# Patient Record
Sex: Male | Born: 1935 | Race: Black or African American | Hispanic: No | Marital: Married | Smoking: Never smoker
Health system: Southern US, Community
[De-identification: ages and names within clinical notes are randomized; demographics above are authoritative.]

## PROBLEM LIST (undated history)

## (undated) DIAGNOSIS — E039 Hypothyroidism, unspecified: Secondary | ICD-10-CM

## (undated) DIAGNOSIS — E119 Type 2 diabetes mellitus without complications: Secondary | ICD-10-CM

## (undated) DIAGNOSIS — A0472 Enterocolitis due to Clostridium difficile, not specified as recurrent: Secondary | ICD-10-CM

## (undated) DIAGNOSIS — R630 Anorexia: Secondary | ICD-10-CM

## (undated) DIAGNOSIS — IMO0001 Reserved for inherently not codable concepts without codable children: Secondary | ICD-10-CM

## (undated) DIAGNOSIS — D649 Anemia, unspecified: Secondary | ICD-10-CM

## (undated) DIAGNOSIS — R7881 Bacteremia: Secondary | ICD-10-CM

## (undated) DIAGNOSIS — I639 Cerebral infarction, unspecified: Secondary | ICD-10-CM

## (undated) DIAGNOSIS — A5203 Syphilitic endocarditis: Secondary | ICD-10-CM

## (undated) DIAGNOSIS — K219 Gastro-esophageal reflux disease without esophagitis: Secondary | ICD-10-CM

## (undated) DIAGNOSIS — I10 Essential (primary) hypertension: Secondary | ICD-10-CM

## (undated) HISTORY — DX: Type 2 diabetes mellitus without complications: E11.9

## (undated) HISTORY — PX: HERNIA REPAIR: SHX51

## (undated) HISTORY — PX: PEG PLACEMENT: SHX5437

## (undated) HISTORY — DX: Bacteremia: R78.81

## (undated) HISTORY — DX: Enterocolitis due to Clostridium difficile, not specified as recurrent: A04.72

## (undated) HISTORY — DX: Cerebral infarction, unspecified: I63.9

## (undated) HISTORY — PX: COLOSTOMY: SHX63

## (undated) HISTORY — DX: Anemia, unspecified: D64.9

---

## 2002-05-07 ENCOUNTER — Encounter: Payer: Self-pay | Admitting: Urology

## 2002-05-07 ENCOUNTER — Encounter: Admission: RE | Admit: 2002-05-07 | Discharge: 2002-05-07 | Payer: Self-pay | Admitting: Urology

## 2002-07-21 ENCOUNTER — Ambulatory Visit: Admission: RE | Admit: 2002-07-21 | Discharge: 2002-10-05 | Payer: Self-pay | Admitting: Radiation Oncology

## 2002-08-19 ENCOUNTER — Inpatient Hospital Stay (HOSPITAL_COMMUNITY): Admission: AD | Admit: 2002-08-19 | Discharge: 2002-08-20 | Payer: Self-pay | Admitting: Endocrinology

## 2002-08-20 ENCOUNTER — Encounter: Payer: Self-pay | Admitting: Endocrinology

## 2003-02-13 ENCOUNTER — Emergency Department (HOSPITAL_COMMUNITY): Admission: EM | Admit: 2003-02-13 | Discharge: 2003-02-14 | Payer: Self-pay | Admitting: Emergency Medicine

## 2003-05-04 ENCOUNTER — Ambulatory Visit: Admission: RE | Admit: 2003-05-04 | Discharge: 2003-05-04 | Payer: Self-pay | Admitting: Radiation Oncology

## 2003-05-11 ENCOUNTER — Ambulatory Visit: Admission: RE | Admit: 2003-05-11 | Discharge: 2003-05-11 | Payer: Self-pay | Admitting: Radiation Oncology

## 2003-07-27 ENCOUNTER — Ambulatory Visit (HOSPITAL_BASED_OUTPATIENT_CLINIC_OR_DEPARTMENT_OTHER): Admission: RE | Admit: 2003-07-27 | Discharge: 2003-07-27 | Payer: Self-pay | Admitting: Endocrinology

## 2005-11-28 ENCOUNTER — Inpatient Hospital Stay (HOSPITAL_COMMUNITY): Admission: RE | Admit: 2005-11-28 | Discharge: 2005-11-30 | Payer: Self-pay | Admitting: Urology

## 2006-04-07 ENCOUNTER — Emergency Department (HOSPITAL_COMMUNITY): Admission: EM | Admit: 2006-04-07 | Discharge: 2006-04-07 | Payer: Self-pay | Admitting: Emergency Medicine

## 2010-05-06 ENCOUNTER — Encounter: Payer: Self-pay | Admitting: Orthopedic Surgery

## 2011-01-19 ENCOUNTER — Emergency Department (HOSPITAL_COMMUNITY): Payer: Medicare Other

## 2011-01-19 ENCOUNTER — Inpatient Hospital Stay (HOSPITAL_COMMUNITY): Payer: Medicare Other

## 2011-01-19 ENCOUNTER — Inpatient Hospital Stay (HOSPITAL_COMMUNITY)
Admission: EM | Admit: 2011-01-19 | Discharge: 2011-01-29 | DRG: 288 | Disposition: A | Discharge status: Skilled Nursing Facility | Payer: Medicare Other | Attending: Internal Medicine | Admitting: Internal Medicine

## 2011-01-19 DIAGNOSIS — E119 Type 2 diabetes mellitus without complications: Secondary | ICD-10-CM | POA: Diagnosis present

## 2011-01-19 DIAGNOSIS — Z933 Colostomy status: Secondary | ICD-10-CM

## 2011-01-19 DIAGNOSIS — R5383 Other fatigue: Secondary | ICD-10-CM | POA: Diagnosis present

## 2011-01-19 DIAGNOSIS — J189 Pneumonia, unspecified organism: Secondary | ICD-10-CM | POA: Diagnosis present

## 2011-01-19 DIAGNOSIS — Z431 Encounter for attention to gastrostomy: Secondary | ICD-10-CM

## 2011-01-19 DIAGNOSIS — E871 Hypo-osmolality and hyponatremia: Secondary | ICD-10-CM | POA: Diagnosis present

## 2011-01-19 DIAGNOSIS — I69991 Dysphagia following unspecified cerebrovascular disease: Secondary | ICD-10-CM

## 2011-01-19 DIAGNOSIS — L8992 Pressure ulcer of unspecified site, stage 2: Secondary | ICD-10-CM | POA: Diagnosis present

## 2011-01-19 DIAGNOSIS — Z794 Long term (current) use of insulin: Secondary | ICD-10-CM

## 2011-01-19 DIAGNOSIS — R1310 Dysphagia, unspecified: Secondary | ICD-10-CM | POA: Diagnosis present

## 2011-01-19 DIAGNOSIS — R5381 Other malaise: Secondary | ICD-10-CM | POA: Diagnosis present

## 2011-01-19 DIAGNOSIS — I10 Essential (primary) hypertension: Secondary | ICD-10-CM | POA: Diagnosis present

## 2011-01-19 DIAGNOSIS — R000 Tachycardia, unspecified: Secondary | ICD-10-CM | POA: Diagnosis not present

## 2011-01-19 DIAGNOSIS — D638 Anemia in other chronic diseases classified elsewhere: Secondary | ICD-10-CM | POA: Diagnosis present

## 2011-01-19 DIAGNOSIS — B952 Enterococcus as the cause of diseases classified elsewhere: Secondary | ICD-10-CM | POA: Diagnosis present

## 2011-01-19 DIAGNOSIS — N390 Urinary tract infection, site not specified: Secondary | ICD-10-CM | POA: Diagnosis present

## 2011-01-19 DIAGNOSIS — G9340 Encephalopathy, unspecified: Secondary | ICD-10-CM | POA: Diagnosis present

## 2011-01-19 DIAGNOSIS — L89109 Pressure ulcer of unspecified part of back, unspecified stage: Secondary | ICD-10-CM | POA: Diagnosis present

## 2011-01-19 DIAGNOSIS — I959 Hypotension, unspecified: Secondary | ICD-10-CM | POA: Diagnosis present

## 2011-01-19 DIAGNOSIS — I4891 Unspecified atrial fibrillation: Secondary | ICD-10-CM | POA: Diagnosis present

## 2011-01-19 DIAGNOSIS — E43 Unspecified severe protein-calorie malnutrition: Secondary | ICD-10-CM | POA: Diagnosis present

## 2011-01-19 DIAGNOSIS — I69959 Hemiplegia and hemiparesis following unspecified cerebrovascular disease affecting unspecified side: Secondary | ICD-10-CM

## 2011-01-19 DIAGNOSIS — I330 Acute and subacute infective endocarditis: Principal | ICD-10-CM | POA: Diagnosis present

## 2011-01-19 LAB — COMPREHENSIVE METABOLIC PANEL
ALT: 43 U/L (ref 0–53)
AST: 60 U/L — ABNORMAL HIGH (ref 0–37)
Alkaline Phosphatase: 86 U/L (ref 39–117)
CO2: 24 mEq/L (ref 19–32)
Calcium: 10.3 mg/dL (ref 8.4–10.5)
Glucose, Bld: 135 mg/dL — ABNORMAL HIGH (ref 70–99)
Potassium: 5.7 mEq/L — ABNORMAL HIGH (ref 3.5–5.1)
Sodium: 136 mEq/L (ref 135–145)
Total Protein: 9.1 g/dL — ABNORMAL HIGH (ref 6.0–8.3)

## 2011-01-19 LAB — BASIC METABOLIC PANEL
CO2: 22 mEq/L (ref 19–32)
Chloride: 104 mEq/L (ref 96–112)
GFR calc Af Amer: 81 mL/min — ABNORMAL LOW (ref >90)
Potassium: 5 mEq/L (ref 3.5–5.1)
Sodium: 134 mEq/L — ABNORMAL LOW (ref 135–145)

## 2011-01-19 LAB — CBC
HCT: 31 % — ABNORMAL LOW (ref 39.0–52.0)
Hemoglobin: 10.1 g/dL — ABNORMAL LOW (ref 13.0–17.0)
MCHC: 32.6 g/dL (ref 30.0–36.0)
WBC: 9.5 10*3/uL (ref 4.0–10.5)

## 2011-01-19 LAB — DIFFERENTIAL
Band Neutrophils: 0 % (ref 0–10)
Basophils Absolute: 0.2 10*3/uL — ABNORMAL HIGH (ref 0.0–0.1)
Basophils Relative: 2 % — ABNORMAL HIGH (ref 0–1)
Blasts: 0 %
Lymphs Abs: 1.8 10*3/uL (ref 0.7–4.0)
Metamyelocytes Relative: 0 %
Myelocytes: 0 %
Promyelocytes Absolute: 0 %

## 2011-01-19 LAB — URINALYSIS, ROUTINE W REFLEX MICROSCOPIC
Bilirubin Urine: NEGATIVE
Glucose, UA: NEGATIVE mg/dL
Hgb urine dipstick: NEGATIVE
Specific Gravity, Urine: 1.015 (ref 1.005–1.030)

## 2011-01-19 LAB — URINE MICROSCOPIC-ADD ON

## 2011-01-19 LAB — POCT I-STAT TROPONIN I: Troponin i, poc: 0.03 ng/mL (ref 0.00–0.08)

## 2011-01-19 LAB — LACTIC ACID, PLASMA: Lactic Acid, Venous: 1.5 mmol/L (ref 0.5–2.2)

## 2011-01-19 LAB — GLUCOSE, CAPILLARY: Glucose-Capillary: 156 mg/dL — ABNORMAL HIGH (ref 70–99)

## 2011-01-19 MED ORDER — IODIXANOL 320 MG/ML IV SOLN: 100.0000 mL | Freq: Once | INTRAVENOUS | Status: AC | PRN

## 2011-01-19 MED ORDER — IOHEXOL 300 MG/ML  SOLN: 50.0000 mL | Freq: Once | INTRAMUSCULAR | Status: AC | PRN

## 2011-01-19 MED ORDER — IOHEXOL 300 MG/ML  SOLN
30.0000 mL | Freq: Once | INTRAMUSCULAR | Status: AC | PRN
  Administered 2011-01-19: 30 mL

## 2011-01-19 NOTE — H&P (Signed)
NAMEORA, [name] NO.:  [id number]  MEDICAL RECORD NO.:  [id number]  LOCATION:  1512                         FACILITY:  [facility]  PHYSICIAN:  Andreas Blower, MD       DATE OF BIRTH:  1935/09/10  DATE OF ADMISSION:  01/19/2011 DATE OF DISCHARGE:                             HISTORY & PHYSICAL   PRIMARY CARE PHYSICIAN:  Dr. Renato Gails.  CHIEF COMPLAINT:  Hypotension and the patient pulled his feeding tube.  HISTORY OF PRESENT ILLNESS:  [name] is a 75 year old gentleman with history of CVA, hypertension, diabetes, with right-sided weakness from his CVA, history of bacteremia and colostomy who had been getting his care at [facility] up until January 15, 2011, subsequently after that he was transferred to [facility] in [place] for rehab.  The wife provided most the history.  It was reported that after transfer, the patient was sleeping most of the day over the last 3 days.  However, today, the patient had dislodged his G- tube this morning.  The patient was started on levofloxacin yesterday for pneumonia.  The patient also was found to be hypotensive in the emergency department with the blood pressure of 84/48, which improved after fluid bolus in the ER.  As a result, the hospitalist service was asked to admit the patient for further care and management. Per wife, has not had any recent fevers, chills, has not had any chest pain, shortness of breath, has not had any abdominal pain, diarrhea, has not had any new headaches or vision changes.  REVIEW OF SYSTEMS:  All systems were reviewed with the patient was positive as per HPI, otherwise all other systems were negative.  PAST MEDICAL HISTORY: 1. History of CVA with residual right-sided weakness.  The patient is     very immobilized due to his CVA. 2. Hypertension. 3. Type 2 diabetes. 4. Severe protein calorie malnutrition. 5. History of bacteremia, was treated at [facility]. 6. History of colostomy  done at [facility]. 7. History of Clostridium difficile colitis.  SOCIAL HISTORY:  The patient does not smoke, does not drink any alcohol. Denies any illegal drugs or substances.  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  FAMILY HISTORY:  Mother died from lung cancer.  Father died from old age after a fall.  He had an elder sister who died from Alzheimer and a younger sister who also is deceased.  HOME MEDICATIONS: 1. Metoprolol 25 mg p.o. by PEG twice daily. 2. Vitamin B1 100 mg p.o. by PEG daily. 3. Tramadol 10 mg p.o. by PEG every 6 hours as needed for pain. 4. Levothyroxine 300 mcg p.o. daily by PEG. 5. Senna-S 2 tablets by PEG twice daily. 6. Ranitidine 150 mg p.o. via PEG twice daily. 7. Pravastatin 40 mg by PEG daily. 8. Potassium chloride 10 mEq p.o. daily by PEG. 9. Omeprazole 20 mg p.o. via PEG daily. 10.NovoLog 3 units every 6 hours as needed for blood sugar greater     than 160. 11.Multivitamin 1 tablet via PEG daily. 12.Mirtazapine 15 mg via PEG daily at bedtime. 13.Metformin 500 mg via PEG 1 tablet daily. 14.Levofloxacin 500 mg via PEG  daily. 15.Guaifenesin 15 mL 4 times a day. 16.Gabapentin 300 mg via PEG daily at bedtime. 17.Furosemide 20 mg p.o. via PEG daily. 18.Florastor 250 mg via PEG twice daily. 19.Citalopram 10 mg via PEG daily. 20.Ipratropium inhaler 2 puffs every 6 hours as needed. 21.Atropine ophthalmic solution 1% 2 drops every 6 hours. 22.Aspirin 325 mg p.o. daily. 23.Acetaminophen 650 mg every 6 hours as needed for pain.  PHYSICAL EXAMINATION:  VITALS:  Temperature is 98.7, blood pressure 101/70, heart rate 95, respirations 18, satting 100% on room air. GENERAL:  The patient was alert, oriented x3, did not appear to be in acute distress, was laying in bed comfortably. HEENT:  Extraocular motions are intact.  Pupils equal, and round.  Had dry mucous membranes. NECK:  Supple. HEART:  Regular with S1 and S2. LUNGS:  Clear to auscultation  bilaterally. ABDOMEN:  Soft, nontender, and nondistended.  PEG in place. EXTREMITIES:  The patient has good peripheral pulses with trace edema. NEUROLOGIC:  Cranial nerves grossly intact.  Had 5/5 motor strength in left upper as well as lower extremity.  The patient had decreased strength in right upper as well as lower extremities against strength but was able to move his right upper extremity against gravity.  RADIOLOGY/IMAGING:  The patient had chest x-ray which showed slight increase markings of left lung base, may represent crowding of vessels, although subtle tree is not excluded.  The patient had abdominal KUB which showed gastrostomy tube within the stomach with no extravasation.  LABORATORY DATA:  CBC shows a white count of 9.5, hemoglobin 10.1, hematocrit 31.0, platelet count 292.  Electrolytes, sodium 134, potassium 5.0, chloride 104, CO2 of 22, BUN 44, creatinine 1.03.  Liver function tests normal except AST is 60, total protein is 9.1.  UA is negative for nitrites and large leukocytes.  ASSESSMENT/PLAN: 1. Acute delirium.  Etiology unclear.  Uncertain if the patient has     subtle underlying pneumonia and urinary tract infection that is     causing his delirium.  The patient is easily arousable.  We will     get a head CT for better evaluation. 2. Hypotension.  Etiology unclear.  The patient is most likely     dehydrated.  We will continue IV hydration.  We will hold     furosemide.  Low suspicion that the patient is septic at this     time, with normal lactic acid. 3. Questionable pneumonia on imaging.  The patient has been started on     empiric vancomycin and Zosyn in the ER.  Antibiotics will be     continued.  We will have the patient on vancomycin, Levaquin and     cefepime.  Depending on the patient's clinical course, we will de-     escalate the antibiotics rapidly if the patient is improving. 4. Urinary tract infection.  Antibiotics as indicated above.  The      patient is on cefepime and levofloxacin. 5. Dehydration.  Continue IV hydration.  We will reassess in the     morning. 6. Dysphagia, likely due to history of CVA.  We will have Speech     Therapy evaluate his swallowing. 7. Dysphagia with severe protein calorie malnutrition.  The patient     currently has PEG placed in the emergency department.  We will     contact Interventional Radiology for placement of a gastric tube     that ends in the jejunum. 8. History of CVA, stable.  Continue home  medications.  We will get a     head CT for evaluation. 9. Anemia, likely due to chronic disease. 10.Hypertension.  The patient is hypotensive.  We will continue     metoprolol with hold parameters, holding furosemide. 11.Type 2 diabetes.  We will have the patient on sliding-scale     insulin. 12.Generalized weakness.  We will have PT and OT evaluate the patient. 13.History of bacteremia.  Not an active issue at this time. 14.Hyponatremia.  Monitor for now. 15.Deep venous thrombosis prophylaxis.  Lovenox for deep venous     thrombosis prophylaxis. 16.Code status.  The patient is full code.  This was discussed with     the patient and wife at the time of admission.   Time spent on admission talking to the patient's wife and coordinating care was 1 hour.   Andreas Blower, MD   SR/MEDQ  D:  01/19/2011  T:  01/19/2011  Job:  [id number]  Electronically Signed by Wardell Heath Jamerica Snavely  on 01/19/2011 08:11:55 PM

## 2011-01-20 LAB — CBC
MCH: 25.4 pg — ABNORMAL LOW (ref 26.0–34.0)
MCHC: 32.1 g/dL (ref 30.0–36.0)
MCV: 79 fL (ref 78.0–100.0)
Platelets: 232 10*3/uL (ref 150–400)
RBC: 3.19 MIL/uL — ABNORMAL LOW (ref 4.22–5.81)

## 2011-01-20 LAB — BASIC METABOLIC PANEL
CO2: 21 mEq/L (ref 19–32)
Calcium: 8.6 mg/dL (ref 8.4–10.5)
Creatinine, Ser: 0.82 mg/dL (ref 0.50–1.35)

## 2011-01-20 LAB — GLUCOSE, CAPILLARY
Glucose-Capillary: 112 mg/dL — ABNORMAL HIGH (ref 70–99)
Glucose-Capillary: 111 mg/dL — ABNORMAL HIGH (ref 70–99)
Glucose-Capillary: 109 mg/dL — ABNORMAL HIGH (ref 70–99)

## 2011-01-21 LAB — GLUCOSE, CAPILLARY
Glucose-Capillary: 78 mg/dL (ref 70–99)
Glucose-Capillary: 100 mg/dL — ABNORMAL HIGH (ref 70–99)

## 2011-01-22 ENCOUNTER — Inpatient Hospital Stay (HOSPITAL_COMMUNITY)
Admit: 2011-01-22 | Discharge: 2011-01-22 | Disposition: A | Discharge status: Home / Self Care | Payer: Medicare Other | Attending: Internal Medicine | Admitting: Internal Medicine

## 2011-01-22 LAB — GLUCOSE, CAPILLARY
Glucose-Capillary: 146 mg/dL — ABNORMAL HIGH (ref 70–99)
Glucose-Capillary: 151 mg/dL — ABNORMAL HIGH (ref 70–99)
Glucose-Capillary: 152 mg/dL — ABNORMAL HIGH (ref 70–99)
Glucose-Capillary: 155 mg/dL — ABNORMAL HIGH (ref 70–99)
Glucose-Capillary: 165 mg/dL — ABNORMAL HIGH (ref 70–99)

## 2011-01-22 LAB — COMPREHENSIVE METABOLIC PANEL
Albumin: 2.1 g/dL — ABNORMAL LOW (ref 3.5–5.2)
BUN: 18 mg/dL (ref 6–23)
Calcium: 8.9 mg/dL (ref 8.4–10.5)
GFR calc Af Amer: >90 mL/min (ref >90)
Glucose, Bld: 163 mg/dL — ABNORMAL HIGH (ref 70–99)
Potassium: 3.9 mEq/L (ref 3.5–5.1)
Sodium: 136 mEq/L (ref 135–145)
Total Protein: 7 g/dL (ref 6.0–8.3)

## 2011-01-22 LAB — CBC
HCT: 25.9 % — ABNORMAL LOW (ref 39.0–52.0)
Hemoglobin: 8.1 g/dL — ABNORMAL LOW (ref 13.0–17.0)
MCH: 25.2 pg — ABNORMAL LOW (ref 26.0–34.0)
MCHC: 31.3 g/dL (ref 30.0–36.0)
RDW: 15.9 % — ABNORMAL HIGH (ref 11.5–15.5)

## 2011-01-22 NOTE — Procedures (Signed)
HISTORY:  A 75 year old male with right-sided weakness.  MEDICATIONS:  NovoLog, Lovenox, Lopressor, vitamin, Synthroid, Senokot, Pepcid, Remeron, Protonix, Zocor, Neurontin, Florastor, Celexa and Rocephin.  CONDITIONS OF RECORDING:  This is a 16-channel EEG carried out with the patient in the awake and drowsy states.  DESCRIPTION:  The waking background activity consists of a low-voltage symmetrical fairly well-organized 8-9 Hz alpha activity seen from the parieto-occipital and posterior temporal regions.  Low-voltage fast activity poorly organized was seen and during at times superimposed on more posterior rhythms.  A mixture of theta and alpha was seen from the central and temporal regions.  The patient drowses with slowing to irregular, low-voltage theta and beta activity.  Stage 2 sleep is not obtained.  Hypoventilation was not performed.  Intermittent photic stimulation was also not performed.  IMPRESSION:  This is a normal awake and drowsy EEG.  No epileptiform activity was noted.          ______________________________ Thana Farr, MD    QI:ONGE D:  01/22/2011 17:48:06  T:  01/22/2011 22:59:33  Job #:  [id number]

## 2011-01-23 ENCOUNTER — Inpatient Hospital Stay (HOSPITAL_COMMUNITY): Payer: Medicare Other

## 2011-01-23 DIAGNOSIS — R7881 Bacteremia: Secondary | ICD-10-CM

## 2011-01-23 LAB — GLUCOSE, CAPILLARY
Glucose-Capillary: 164 mg/dL — ABNORMAL HIGH (ref 70–99)
Glucose-Capillary: 185 mg/dL — ABNORMAL HIGH (ref 70–99)
Glucose-Capillary: 198 mg/dL — ABNORMAL HIGH (ref 70–99)

## 2011-01-23 LAB — CBC
MCV: 80.4 fL (ref 78.0–100.0)
Platelets: 249 10*3/uL (ref 150–400)
RDW: 16.3 % — ABNORMAL HIGH (ref 11.5–15.5)
WBC: 3.7 10*3/uL — ABNORMAL LOW (ref 4.0–10.5)

## 2011-01-23 LAB — BASIC METABOLIC PANEL
Chloride: 108 mEq/L (ref 96–112)
Creatinine, Ser: 0.65 mg/dL (ref 0.50–1.35)
GFR calc Af Amer: >90 mL/min (ref >90)
Potassium: 3.8 mEq/L (ref 3.5–5.1)
Sodium: 137 mEq/L (ref 135–145)

## 2011-01-24 ENCOUNTER — Ambulatory Visit (HOSPITAL_COMMUNITY)
Admission: AD | Admit: 2011-01-24 | Discharge: 2011-01-24 | Disposition: A | Discharge status: Home / Self Care | Payer: Medicare Other | Source: Ambulatory Visit | Attending: Cardiology | Admitting: Cardiology

## 2011-01-24 DIAGNOSIS — I39 Endocarditis and heart valve disorders in diseases classified elsewhere: Secondary | ICD-10-CM

## 2011-01-24 DIAGNOSIS — I330 Acute and subacute infective endocarditis: Secondary | ICD-10-CM

## 2011-01-24 DIAGNOSIS — I059 Rheumatic mitral valve disease, unspecified: Secondary | ICD-10-CM

## 2011-01-24 LAB — CBC
HCT: 26 % — ABNORMAL LOW (ref 39.0–52.0)
Hemoglobin: 8.4 g/dL — ABNORMAL LOW (ref 13.0–17.0)
MCH: 25.7 pg — ABNORMAL LOW (ref 26.0–34.0)
MCV: 79.5 fL (ref 78.0–100.0)
RBC: 3.27 MIL/uL — ABNORMAL LOW (ref 4.22–5.81)
RDW: 16.5 % — ABNORMAL HIGH (ref 11.5–15.5)

## 2011-01-24 LAB — BASIC METABOLIC PANEL
BUN: 16 mg/dL (ref 6–23)
CO2: 26 mEq/L (ref 19–32)
Glucose, Bld: 165 mg/dL — ABNORMAL HIGH (ref 70–99)
Potassium: 3.8 mEq/L (ref 3.5–5.1)
Sodium: 139 mEq/L (ref 135–145)

## 2011-01-24 LAB — GLUCOSE, CAPILLARY
Glucose-Capillary: 165 mg/dL — ABNORMAL HIGH (ref 70–99)
Glucose-Capillary: 161 mg/dL — ABNORMAL HIGH (ref 70–99)
Glucose-Capillary: 122 mg/dL — ABNORMAL HIGH (ref 70–99)
Glucose-Capillary: 156 mg/dL — ABNORMAL HIGH (ref 70–99)

## 2011-01-24 NOTE — Consult Note (Signed)
[name], [name] NO.:  [id number]  MEDICAL RECORD NO.:  [id number]  LOCATION:  EE                           FACILITY:  [facility]  PHYSICIAN:  Judyann Munson, MD     DATE OF BIRTH:  [birth year]  DATE OF CONSULTATION: DATE OF DISCHARGE:  01/22/2011                                CONSULTATION   REQUESTING PHYSICIAN:  Andreas Blower, MD  REASON FOR CONSULTATION:  Bacteremia and urinary tract infection. Please provide antibiotic recommendations.  HISTORY OF PRESENT ILLNESS:  [name] is a 75 year old African American male, with history of hypertension, diabetes, cerebrovascular accident with sequelae of right-sided weakness.  He is status post PEG and status post colostomy.  Patient is known to seek his care at [facility] and has had multiple admissions over the past year including being treated for bacteremia as well as having C. difficile infection for which he was on prolonged vancomycin taper for 6 weeks.  The patient was transferred from local rehab center in [place] for malaise, somnolence x 3 days and pulling out in his PEG tube.  In the emergency room, he was found to be hypotensive with systolics in the 80s/40s, which subsequently improved with IV fluid hydration.  Due to his altered mental status, he underwent an infectious workup, which included blood cultures, urine cultures, chest x-ray, as well ass NCHCT to ensure there was no further intracranial process that could account for his altered mental status.  Per report, his wife stated that there was no mention of recent fevers or chills, night sweats, no chest pain, or shortness of breath or cough.  No abdominal pain or diarrhea.    The patient, on admit, had a white count of 9.5 with 62% neutrophils, however, this was somewhat elevated from his baseline as his white count is 3.7 today.  He was afebrile and no longer had any hypotension after fluid resuscitation.  His blood cultures on  January 19, 2011, did show not only coag-negative staphylococcus species, but enterococcus. he had 2 sets of urine cultures sent, 1 of which showing an E. coli and the second of which showed enterococcus species.  The patient was initially started on levofloxacin at the rehab center for unclear reasons and subsequently was seen in the emergency room where his antibiotics were changed to vancomycin, piperacillin and tazobactam.  The patient was ultimately changed to vancomycin and cefepime, however, once his urine cultures showed that he had an E. Coli species that was sensitive to ceftriaxone, he remained on ceftriaxone and vancomycin.   the patient states that he is still having malaise, but no fevers or chills.  PAST MEDICAL HISTORY: 1. Hypertension. 2. Type 2 diabetes. 3. Paroxysmal atrial fibrillation. 4. History of SVT in 2004. 5. History of CVA with residual right-sided weakness. 6. Status post PEG. 7. Status post colostomy. 8. History of impotence status post penile prosthesis in 2007. 9. History of prostate cancer, status post prostatectomy in 1994. 10.Status post cholecystectomy in 1978. 11.Hernia repair in 1946. 12.Lithotripsy in 1990. 13.Severe protein-calorie malnutrition. 14.History of bacteremia of unknown etiology and workup at this time. 15.History of C. difficile colitis in 2012. 16. history of sacral  decubitus ulcer +/- osteomyelitis  SOCIAL HISTORY:  The patient is married, has 4 grown children.  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  No smoking, alcohol, or illicit drug use.  FAMILY HISTORY:  Significant for lung cancer and Alzheimer disease.  REVIEW OF SYSTEMS:  The patient denies fevers, chills, night sweats, cough, chest pain, abdominal cramping, diarrhea, no constipation.  He does subscribe to right-sided weakness, predominantly stays  wheelchair bound secondary to a CVA.  No rash, headaches, or difficulty with vision.  12-point review of systems was otherwise negative.  ALL: no allergies to antibiotics  MEDICATIONS: 1. Ceftriaxone 1 g q.24 h. 2. Vancomycin 1250 mg q.24 h. 3. Celexa 10 mg daily. 4. Vitamin B12 100 mcg daily. 5. Enoxaparin 40 mg subcu daily. 6. Famotidine 20 mg b.i.d. 7. Neurontin 300 mg q.h.s. 8. Guaifenesin 15 mL per PEG q.i.d. 9. Insulin 9 units subcu q.4 hours. 10.Synthroid 300 mcg daily. 11.Metoprolol 12.5 mg q.12 h. 12.Remeron 15 mg q.h.s. 13.Multivitamin 1 tablet daily. 14.Osmolite tube feeds. 15.Protonix 40 mg daily. 16.Saccharomyces 250 mg b.i.d. 17.Senna 2 tablets b.i.d. 18.Zocor 20 mg q.h.s.  PHYSICAL EXAMINATION:  VITAL SIGNS:  Afebrile, 97.4; pulse 69; blood pressure 104/61; 100% O2 sats on 2 L. GENERAL:  This is an elderly African American male in no acute distress. He is alert and oriented x3, lying in bed, not in extremis. HEENT:  PERRLA, EOMI.  No scleral icterus.  Oropharynx is moist. NECK:  Supple.  No lymphadenopathy. CARDIAC:  Normal S1, S2.  No gallops, murmurs, or rubs. PULMONARY:  Clear to auscultation bilaterally in the anterior and lateral lung fields. ABDOMEN:  Evidence of having a PEG as well as a colostomy bag in left lower quadrant.  Nontender, nondistended with positive bowel sounds. EXTREMITIES:  Trace edema bilaterally.  Musculature in the right lower extremity, he does have increased tone. NEUROLOGIC:  Cranial nerves II through XII are grossly intact.  He has 5/5 motor strength in the left upper extremity and left lower extremity, only has 3/5 active strength in the right hand and downgoing toes. Unable to elicit clonus. SKIN: patient has a shallow 2 x 3.5 x 0.5 cm stage II lesion, chronic appearing, clean base mild undermining towards peritoneum. non draining.  LABORATORY DATA:   on admit: His CBC 9.5, 62% neutrophils, 15 lymphocytes, 15 monocytes,  hematocrit of 31, and platelets 292, creatinine is 0.5.  MICROBIOLOGY: 1. Urine culture on January 19, 2011, shows 100,000 CFU of E. coli,     resistant :ampicillin, cefazolin, and Cipro with an MIC <4,     sensitive :ceftriaxone< 1, gentamicin 2,tobramycin 2     nitrofurantoin, and Bactrim < 20. 2. Blood culture on January 19, 2011, shows coag-negative staph with an     MIC of clindamycin 0.25, gentamicin 0.5, rifampin 0.5,     vancomycin 1; resistant: erythromycin >8, tetracycline > 16;     penicillin.  Enterococcus species still has pending susceptibilities.    3.  Second blood culture on January 19, 2011, no growth to date.    4.  Urine culture on January 20, 2011, shows enterococcus of 50,000 colonies, sensitivities are     pending. 5 . Blood cultures on January 21, 2011, shows no growth to date in one     set. 6 . Blood cultures on January 20, 2011, shows gram-positive cocci in  chains and pairs on gram stain.  ID still pending.  RADIOLOGY: 1. Chest x-ray, slight increased markings in left lung base, thought     to be crowding of vessels, but infiltrate has not been excluded.     Abdominal x-ray shows G-tube is in place. 2. Noncontrast head CT, no new acute findings. 3. EEG done on January 22, 2011, shows no signs consistent with acute     seizure abnormality.  ASSESSMENT AND PLAN:  This is a 75 year old African American male with history of cardiovascular and cerebrovascular accident, status post right-sided weakness, has a history of bacteremia and Clostridium difficile infection over the past few months, now presents with 3 days of malaise and altered mental status and found to have polymicrobial positive blood cultures as well as a urine culture with Enterococcus and Escherichia coli.  He is currently on vancomycin and ceftriaxone and remained afebrile and normotensive. 1. For bacteremia, it is concerning that he has 2 sets of blood     cultures both on January 19, 2011 and  January 21, 2011, that have     gram-positive cocci.  We await the identification on blood cultures     from January 21, 2011, but we would recommend still continue     treating with vancomycin.  There is very little vancomycin-     resistant enterococcus at our facility.  If the patient does have a     fever, I would recommend changing him over to daptomycin.  At this     moment, appears to be stable.  We await sensitivities and recommend     that he undergo a transesophageal echocardiography to rule out     endocarditis.  He is known to have enterococcus in his urine, which     is concerning for dissemination.  Also recommend getting the     outside records from [facility] to see what has been done in     the past for his previous bacteremia and also find out information     about the patient having Clostridium difficile. 2. Urinary tract infection with Escherichia coli, continue to treat     with ceftriaxone for a 7-day course of therapy. 3. Urine culture that is positive for enterococcus, only 50,000 colony-     forming units, is concerning for disseminated infection with     enterococcus. 4. History of Clostridium difficile, recommend to discontinue his     proton pump inhibitor as well as saccharomyces. we will avoid     using any unnecessary antibiotics.  If the patient happens to have loose bowel     movements, would have a low threshold to test for a     Clostridium difficile, and emperically treat. 5. Sacral wound.  The patient has a stage II decubitus ulcer and he     reports that in the past they used to be worse.  I would recommend     just proper local wound care to ensure that does not get infected.  I have spent greater than 45 min speaking with patient, coordinating with micro lab and review records for this consultation.  It has been a pleasure to take part in [name] care.        ______________________________ Judyann Munson, MD     CS/MEDQ  D:   01/23/2011  T:  01/23/2011  Job:  [id number]  Electronically Signed by Judyann Munson MD on 01/24/2011 08:25:20 AM

## 2011-01-25 LAB — GLUCOSE, CAPILLARY
Glucose-Capillary: 178 mg/dL — ABNORMAL HIGH (ref 70–99)
Glucose-Capillary: 150 mg/dL — ABNORMAL HIGH (ref 70–99)

## 2011-01-25 LAB — BASIC METABOLIC PANEL
CO2: 25 mEq/L (ref 19–32)
Calcium: 8.9 mg/dL (ref 8.4–10.5)
Chloride: 108 mEq/L (ref 96–112)
Creatinine, Ser: 0.51 mg/dL (ref 0.50–1.35)
GFR calc Af Amer: >90 mL/min (ref >90)
Sodium: 139 mEq/L (ref 135–145)

## 2011-01-25 LAB — CBC
Platelets: 239 10*3/uL (ref 150–400)
RBC: 3.12 MIL/uL — ABNORMAL LOW (ref 4.22–5.81)
RDW: 17 % — ABNORMAL HIGH (ref 11.5–15.5)
WBC: 4.4 10*3/uL (ref 4.0–10.5)

## 2011-01-25 LAB — PRO B NATRIURETIC PEPTIDE: Pro B Natriuretic peptide (BNP): 746.6 pg/mL — ABNORMAL HIGH (ref 0–125)

## 2011-01-25 NOTE — Consult Note (Signed)
[name], [name]                ACCOUNT NO.:  [id number]  MEDICAL RECORD NO.:  [id number]  LOCATION:  EE                           FACILITY:  [facility]  PHYSICIAN:  Salvatore Decent. Cornelius Moras, M.D. DATE OF BIRTH:  1936/04/05  DATE OF CONSULTATION:  01/24/2011 DATE OF DISCHARGE:  01/22/2011                                CONSULTATION   REQUESTING PHYSICIAN:  [facility].  REASON FOR CONSULTATION:  Bacterial endocarditis.  HISTORY OF PRESENT ILLNESS:  The patient is a 75 year old African American male with complex medical history.  He has a previous history of a large hemispheric stroke, which has left him with severe right- sided weakness and dysarthria.  History is also notable for hypertension, diabetes mellitus type 2; and a recent history of prolonged illness for which he was treated at [facility].  Details of that hospital admission are not currently available, and it does not appear that any effort has been made to find out what happened there.  However, by report the patient had history of bacteremia as well as severe Clostridium difficile colitis, for which he ultimately underwent diverting colostomy.  He was eventually discharged to a nursing home here in [place] for rehabilitation on January 15, 2011.  While in the nursing home, the patient developed progressive lethargy and disorientation.  He apparently had been started on oral levofloxacin for possible pneumonia.  He accidentally pulled out his feeding tube and was noted to be hypotensive.  He was sent to the emergency department here at [facility] and admitted to the hospitalist service on January 19, 2011.  Following hospital admission, the patient's hypotension was treated with fluid resuscitation.  He underwent an infectious workup including blood cultures, urine cultures, and a chest x-ray.  Noncontrast CT scan of the brain was performed because of his altered mental status.  This did  not reveal any obvious acute problems, although there was moderate generalized atrophy as well as moderate-to-severe chronic microvascular ischemic changes in the white matter.  The patient was started empirically on antibiotics.  Blood cultures grew coagulase negative Staphylococcus and Staphylococcus haemolyticus.  The coag-negative Staphylococcus was sensitive to oxacillin.  Blood culture also grew enterococcus faecalis.  Two sets of repeat blood cultures performed on January 21, 2011, are also growing gram-positive cocci on preliminary report.  Urine cultures grew E coli and Enterococcus.  The patient underwent a transesophageal echocardiogram earlier today.  This revealed a vegetation on the mitral valve consistent with bacterial endocarditis. There is mitral valve prolapse with moderate (2+) mitral regurgitation. There was normal left ventricular size and function.  There were no sign of any vegetations on the aortic valve, the tricuspid valve, nor the pulmonic valve.  There was no left atrial thrombus appreciated.  The intra-atrial septum was intact.  No other significant abnormalities were noted.  Cardiothoracic surgical consultation and Infectious Disease Team were consulted.  REVIEW OF SYSTEMS:  Limited review of systems is performed at the patient's bedside.  The patient's family is not present.  The patient denies shortness of breath.  He denies chest pain.  He denies any palpitations.  He denies any fevers or chills.  He is not  a very reliable historian at present.  His functional status prior to admission remains entirely unclear and appears to be relatively poor.  PAST MEDICAL HISTORY: 1. Hypertension. 2. Type 2 diabetes mellitus. 3. Paroxysmal atrial fibrillation. 4. SVT 5. Previous stroke with longstanding right-sided weakness. 6. Previous percutaneous endoscopic gastrostomy tube placement for     long-term feeding. 7. Previous diverting colostomy. 8.  Impotence. 9. Prostate cancer. 10.Severe protein calorie malnutrition. 11.History of C difficile colitis. 12.History of sacral decubitus ulcer with or without osteomyelitis. 13.Previous kidney stones.  Records from [facility] are not in the chart.  PAST SURGICAL HISTORY:  Is notable for previous percutaneous endoscopic gastrostomy tube, colostomy, penile prosthesis, prostatectomy, cholecystectomy, hernia repair, and lithotripsy.  SOCIAL HISTORY:  The patient is married with 4 grown children.  He is retired having previously served as Production designer, theatre/television/film at [employer].  He has been in [employer] all of his life and former Education officer, environmental of a church.  Since his stroke, the patient has been wheelchair- bound.  He currently resides in a nursing home rehab center.  The patient has no history of no history of smoking, alcohol, or illicit drug use.  FAMILY HISTORY:  Noncontributory.  CURRENT MEDICATIONS:  Listed in the patient's chart and reviewed.  PHYSICAL EXAMINATION:  GENERAL:  The patient is a chronically ill elderly African American male.  He has obvious right-sided facial droop and severe right-sided hemiplegia.  He is alert and conversant and clinically stable.  He denies shortness of breath. NECK:  Supple.  There are no carotid bruits.  There is no jugular venous distention. CHEST:  Auscultation of the chest reveals clear breath sounds anteriorly. CARDIOVASCULAR:  Notable for regular rate and rhythm.  No murmurs, rubs, or gallops noted. ABDOMEN:  Soft, nontender.  There is a feeding tube in the stomach. EXTREMITIES:  Warm and adequately perfused.  There is mild bilateral lower extremity edema.  There is no peripheral stigmata of bacterial endocarditis noted. RECTAL:  Deferred. GU:  Deferred.  DIAGNOSTIC TESTS:  Transesophageal echocardiogram performed today is reviewed.  This demonstrates a moderate-sized vegetation on the atrial surface of the  mitral valve consistent with bacterial endocarditis. There is mild-to-moderate mitral regurgitation.  There is mitral valve prolapse.  There are no flail segments of the valve.  Left ventricular systolic function is normal.  The aortic valve appears normal. Tricuspid valve appears normal.  Pulmonic valve appears normal.  No other significant abnormalities are noted.  12-lead electrocardiogram performed on January 19, 2011, reveals normal sinus rhythm with first-degree AV block.  IMPRESSION:  This elderly gentleman appears to have bacterial endocarditis.  Blood cultures are positive for gram-positive cocci.  The patient also is known to have E. coli and enterococcus in his urine.  The patient has reported history of bacteremia while he was being treated at [facility].  No effort has been made to obtain records from this hospitalization recently at [facility], and it is quite possible that the diagnosis of bacterial endocarditis is not new.  Nevertheless, the patient has no indications for any type of surgical intervention at present.  He does not have congestive heart failure.  He does not have severe mitral regurgitation.  He does not have high-degree AV block.  He does not appear to have suffered any type of embolic event.  It is unclear what type of medical therapy he has had at this time.  More importantly, I am skeptical that this patient should  be considered candidate for surgical treatment of bacterial endocarditis even if he develops clear indications for surgery.  Unfortunately, the patient's wife and family are not currently present to discuss matters.  However, he obviously has suffered from longstanding list of a variety of medical problems, for which he has been treated primarily at [facility].  He suffered a large stroke, and his functional status is obviously very limited.  His ability to tolerate open heart surgery would obviously be  limited at best.  RECOMMENDATIONS:  I recommend obtaining all old records from [facility] to find out exactly what the patient has been treated for at their institution.  If the family is more comfortable with him being treated at [facility], it might be reasonable to consider transfer back to his previous caregivers. However, at this point in time it seems clear that long-term antibiotic therapy would be most appropriate for treatment of his bacterial endocarditis.  If the patient desires to re-establish long-term care here in [place] and further surgical consultation is needed to consider whether or not he might be a candidate for surgical treatment of his endocarditis if he develops further complications, I would be happy to see him again in the future as needed.  Please do not hesitate to call if I can be of further assistance.  The patient needs a cardiology consult.     Salvatore Decent. Cornelius Moras, M.D.     CHO/MEDQ  D:  01/24/2011  T:  01/24/2011  Job:  [id number]  Electronically Signed by Tressie Stalker M.D. on 01/25/2011 12:53:52 AM

## 2011-01-26 ENCOUNTER — Inpatient Hospital Stay (HOSPITAL_COMMUNITY): Payer: Medicare Other

## 2011-01-26 DIAGNOSIS — R7881 Bacteremia: Secondary | ICD-10-CM

## 2011-01-26 LAB — GLUCOSE, CAPILLARY
Glucose-Capillary: 150 mg/dL — ABNORMAL HIGH (ref 70–99)
Glucose-Capillary: 162 mg/dL — ABNORMAL HIGH (ref 70–99)
Glucose-Capillary: 194 mg/dL — ABNORMAL HIGH (ref 70–99)
Glucose-Capillary: 200 mg/dL — ABNORMAL HIGH (ref 70–99)

## 2011-01-26 LAB — CBC
HCT: 25.9 % — ABNORMAL LOW (ref 39.0–52.0)
Hemoglobin: 8.3 g/dL — ABNORMAL LOW (ref 13.0–17.0)
MCH: 26.1 pg (ref 26.0–34.0)
MCV: 81.4 fL (ref 78.0–100.0)
RBC: 3.18 MIL/uL — ABNORMAL LOW (ref 4.22–5.81)

## 2011-01-26 LAB — BASIC METABOLIC PANEL
BUN: 14 mg/dL (ref 6–23)
CO2: 26 mEq/L (ref 19–32)
Calcium: 8.9 mg/dL (ref 8.4–10.5)
Glucose, Bld: 185 mg/dL — ABNORMAL HIGH (ref 70–99)
Sodium: 137 mEq/L (ref 135–145)

## 2011-01-26 LAB — VANCOMYCIN, TROUGH: Vancomycin Tr: 13.2 ug/mL (ref 10.0–20.0)

## 2011-01-27 LAB — GLUCOSE, CAPILLARY
Glucose-Capillary: 193 mg/dL — ABNORMAL HIGH (ref 70–99)
Glucose-Capillary: 144 mg/dL — ABNORMAL HIGH (ref 70–99)

## 2011-01-28 LAB — CBC
HCT: 24.7 % — ABNORMAL LOW (ref 39.0–52.0)
Hemoglobin: 7.7 g/dL — ABNORMAL LOW (ref 13.0–17.0)
MCH: 25.7 pg — ABNORMAL LOW (ref 26.0–34.0)
MCV: 82.3 fL (ref 78.0–100.0)
RBC: 3 MIL/uL — ABNORMAL LOW (ref 4.22–5.81)

## 2011-01-28 LAB — BASIC METABOLIC PANEL
BUN: 15 mg/dL (ref 6–23)
CO2: 30 mEq/L (ref 19–32)
Calcium: 8.8 mg/dL (ref 8.4–10.5)
Creatinine, Ser: 0.61 mg/dL (ref 0.50–1.35)
Glucose, Bld: 169 mg/dL — ABNORMAL HIGH (ref 70–99)

## 2011-01-29 LAB — ABO/RH: ABO/RH(D): B POS

## 2011-01-29 LAB — BASIC METABOLIC PANEL
BUN: 15 mg/dL (ref 6–23)
CO2: 29 mEq/L (ref 19–32)
Chloride: 106 mEq/L (ref 96–112)
Creatinine, Ser: 0.54 mg/dL (ref 0.50–1.35)

## 2011-01-29 LAB — GLUCOSE, CAPILLARY
Glucose-Capillary: 140 mg/dL — ABNORMAL HIGH (ref 70–99)
Glucose-Capillary: 148 mg/dL — ABNORMAL HIGH (ref 70–99)
Glucose-Capillary: 154 mg/dL — ABNORMAL HIGH (ref 70–99)
Glucose-Capillary: 155 mg/dL — ABNORMAL HIGH (ref 70–99)
Glucose-Capillary: 164 mg/dL — ABNORMAL HIGH (ref 70–99)

## 2011-01-29 LAB — CBC
HCT: 27.5 % — ABNORMAL LOW (ref 39.0–52.0)
RDW: 18.7 % — ABNORMAL HIGH (ref 11.5–15.5)
WBC: 5.1 10*3/uL (ref 4.0–10.5)

## 2011-01-30 LAB — CROSSMATCH
Antibody Screen: NEGATIVE
Unit division: 0

## 2011-02-01 NOTE — Consult Note (Signed)
NAMERENZO, VINCELETTE NO.:  [id number]  MEDICAL RECORD NO.:  [id number]  LOCATION:  EE                           FACILITY:  [facility]  PHYSICIAN:  Vesta Mixer, M.D. DATE OF BIRTH:  [birth year]  DATE OF CONSULTATION: DATE OF DISCHARGE:  01/22/2011                                CONSULTATION   Consultation was from the [facility] hospitalist.  [name] is a 75 year old gentleman with a history of hypertension, diabetes mellitus, and cerebrovascular disease.  He has had a prolonged illness at [facility].  He was admitted to the hospital on October 5, with dehydration, mental status changes, hypertension, and generalized weakness.  He was found to have a pulled out his jejunostomy tube and was thought to be dehydrated.  Workup since that time has revealed that he had positive blood cultures, growing multiple bacteria.  Subsequent transesophageal echocardiogram today performed by Dr. Shirlee Latch reveals the presence of mitral valve endocarditis.  [name] was examined today without any family around.  He is able to give some history, although he is not real clear on some aspects of his medical history.  In addition, we do not have any records from [facility].  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  He also states that he used to work at [employer] and [employer] and was a Public house manager.  He had a stroke many years ago, and has been debilitated since that time.  The patient has had a prolonged treatment at [facility] for bacteremia.  He has also had a colostomy and also has had a J-tube placed for feeding.  He has been treated aggressively with IV antibiotics for prolonged time to the point where he developed Clostridium difficile enterocolitis.  He has been receiving vancomycin therapy.  He was recently discharged from [facility] on October 1, and was sent to a nursing home.  The patient accidentally pulled out his J-tube several days ago, and became very dehydrated.  He  developed mental status changes and hypotension.  He was brought to [facility] for further evaluation.  He was resuscitated with IV fluids and his mental status improved.  Subsequent workup revealed positive blood cultures.  He is growing Staph haemolyticus, and a potentially different coag-negative staph and gram- positive cocci in pairs, thought to be due to strep.  In addition, his urine culture is growing E. coli.  The patient has had transesophageal echo today, which revealed endocarditis.  The patient denies any previous cardiac problems.  He denies any chest pain or shortness breath.  He claims to be able to walk, although the exam does not really support this.  He has not had any syncope or presyncope.  He denies any PND or orthopnea.  He appears to be quite comfortable lying in bed.  CURRENT MEDICATIONS:  Include gentamicin IV and vancomycin IV.  ALLERGIES:  There are no known drug allergies.  PAST MEDICAL HISTORY: 1. Hypertension. 2. Type 2 diabetes mellitus. 3. Paroxysmal atrial fibrillation. 4. History of SVT. 5. History of CVA with residual right-sided weakness. 6. History of jejunostomy feeding tube. 7. Colostomy. 8. History of prostate cancer - status post prostatectomy in 1994.  9. History of cholecystectomy. 10.Prolonged history of bacteremia at [facility]. 11.History of C. difficile colitis, presumably due to prolonged IV     antibiotic therapy. 12.History of sacral decubitus ulcer with possible osteomyelitis.  SOCIAL HISTORY:  The patient does not smoke and does not drink alcohol. 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  He is now wheelchair-bound and bed-bound.  FAMILY HISTORY:  Significant for lung cancer and Alzheimer disease.  REVIEW OF SYSTEMS:  Reviewed as best as we could with the patient.  The review of systems are noted in the HPI, and all other systems are negative.  PHYSICAL EXAMINATION:  GENERAL:  He is  an elderly gentleman, in no acute distress.  He was slightly slow to answer questions, but was very cooperative and very pleasant. VITAL SIGNS:  His temperature is 97.8, his heart rate is 82, his blood pressure is 120/73, his O2 saturations 100% on room air. HEENT EXAM:  Reveals that he has relatively poor dentition.  There is no JVD.  He has no splinter.  He has no conjunctival hemorrhages.  His mucous membranes are fairly dry. NECK:  Somewhat stiff. BACK:  Very stiff. LUNGS:  We were not able to listen very well to his lungs because he could not sit up and actually display the quite profound stiffness when we tried to help him out. HEART:  Regular rate, S1, S2.  I did not hear a systolic murmur.  His PMI was nondisplaced. ABDOMINAL EXAM:  Reveals good bowel sounds.  He had a J-tube in place. He had a colostomy bag. EXTREMITIES:  He had no Janeway lesions.  There are no splinter hemorrhages.  His right arm was flaccid.  He was able to move his right leg slightly. NEURO:  His speech was somewhat dysarthric.  Gait was not assessed.  He was unable to participate in helping [place] to sit him up.  LABORATORY DATA:  Reveals a white blood cell count of 4.5, his hemoglobin is 8.4, hematocrit is 26.  Sodium is 139, potassium is 3.8, chloride 107, CO2 is 26, BUN is 16, creatinine is 0.55, glucose is 166.  His EKG reveals normal sinus rhythm.  He has first-degree AV block.  His blood cultures have been noted above.  IMPRESSION AND PLAN:  Mitral valve endocarditis.  The patient has moderate mitral regurgitation.  He also appears to have a perforated mitral valve and vegetation.  His blood cultures have grown out at least 2 and perhaps 3 different bacteria.  At this point, he is overall doing fairly well from a cardiac standpoint.  He appears to be quite comfortable.  There is no advanced AV block.  He has not had any chest pain and there is no evidence of congestive heart failure.  The  patient is an extremely poor surgical candidate at this point. Fortunately at this time, he does not need any surgical intervention.  I would recommend that we continue with IV antibiotic therapy.  This may be complicated by Clostridium difficile colitis, which he has had in the recent past.  We need to get the records from [facility].  I suspect a lot of his medical issues and a lot of his problems have already been fully evaluated at [facility].  I will defer to Infectious Disease Department the types and duration of his antibiotic therapy.  We will sign off at this point.  Please call us if there are any further questions.  Vesta Mixer, M.D.     PJN/MEDQ  D:  01/24/2011  T:  01/25/2011  Job:  [id number]  cc:   Salvatore Decent. Cornelius Moras, M.D. [address] [place] [address]  [facility]  Electronically Signed by Kristeen Miss M.D. on 02/01/2011 09:52:47 AM

## 2011-02-03 ENCOUNTER — Emergency Department (HOSPITAL_COMMUNITY)
Admission: EM | Admit: 2011-02-03 | Discharge: 2011-02-03 | Disposition: A | Discharge status: Home / Self Care | Payer: Medicare Other | Attending: Emergency Medicine | Admitting: Emergency Medicine

## 2011-02-03 DIAGNOSIS — R5381 Other malaise: Secondary | ICD-10-CM | POA: Insufficient documentation

## 2011-02-03 DIAGNOSIS — T82598A Other mechanical complication of other cardiac and vascular devices and implants, initial encounter: Secondary | ICD-10-CM | POA: Insufficient documentation

## 2011-02-03 DIAGNOSIS — Y849 Medical procedure, unspecified as the cause of abnormal reaction of the patient, or of later complication, without mention of misadventure at the time of the procedure: Secondary | ICD-10-CM | POA: Insufficient documentation

## 2011-02-03 DIAGNOSIS — I10 Essential (primary) hypertension: Secondary | ICD-10-CM | POA: Insufficient documentation

## 2011-02-03 DIAGNOSIS — Z8673 Personal history of transient ischemic attack (TIA), and cerebral infarction without residual deficits: Secondary | ICD-10-CM | POA: Insufficient documentation

## 2011-02-04 ENCOUNTER — Emergency Department (HOSPITAL_COMMUNITY)
Admission: EM | Admit: 2011-02-04 | Discharge: 2011-02-04 | Disposition: A | Discharge status: Home / Self Care | Payer: Medicare Other | Attending: Emergency Medicine | Admitting: Emergency Medicine

## 2011-02-04 DIAGNOSIS — Z933 Colostomy status: Secondary | ICD-10-CM | POA: Insufficient documentation

## 2011-02-04 DIAGNOSIS — G8220 Paraplegia, unspecified: Secondary | ICD-10-CM | POA: Insufficient documentation

## 2011-02-04 DIAGNOSIS — I10 Essential (primary) hypertension: Secondary | ICD-10-CM | POA: Insufficient documentation

## 2011-02-04 DIAGNOSIS — K922 Gastrointestinal hemorrhage, unspecified: Secondary | ICD-10-CM | POA: Insufficient documentation

## 2011-02-04 DIAGNOSIS — E119 Type 2 diabetes mellitus without complications: Secondary | ICD-10-CM | POA: Insufficient documentation

## 2011-02-04 LAB — URINE CULTURE
Colony Count: >=100000
Culture  Setup Time: 201210051357
Culture  Setup Time: 201210070213

## 2011-02-04 LAB — CULTURE, BLOOD (ROUTINE X 2)
Culture  Setup Time: 201210051049
Culture  Setup Time: 201210072133
Culture  Setup Time: 201210110251

## 2011-02-04 LAB — CBC
MCV: 83.6 fL (ref 78.0–100.0)
Platelets: 161 10*3/uL (ref 150–400)
RBC: 3.65 MIL/uL — ABNORMAL LOW (ref 4.22–5.81)
WBC: 5.2 10*3/uL (ref 4.0–10.5)

## 2011-02-04 LAB — POCT I-STAT, CHEM 8
Calcium, Ion: 1.21 mmol/L (ref 1.12–1.32)
Chloride: 108 mEq/L (ref 96–112)
HCT: 31 % — ABNORMAL LOW (ref 39.0–52.0)
Potassium: 4.3 mEq/L (ref 3.5–5.1)

## 2011-02-04 LAB — DIFFERENTIAL
Basophils Absolute: 0.1 10*3/uL (ref 0.0–0.1)
Eosinophils Absolute: 0.4 10*3/uL (ref 0.0–0.7)
Lymphocytes Relative: 22 % (ref 12–46)
Lymphs Abs: 1.1 10*3/uL (ref 0.7–4.0)
Neutrophils Relative %: 59 % (ref 43–77)

## 2011-02-04 LAB — PROTIME-INR: INR: 1.11 (ref 0.00–1.49)

## 2011-02-13 ENCOUNTER — Inpatient Hospital Stay (HOSPITAL_COMMUNITY)
Admission: EM | Admit: 2011-02-13 | Discharge: 2011-02-20 | DRG: 377 | Disposition: A | Discharge status: Skilled Nursing Facility | Payer: Medicare Other | Attending: Internal Medicine | Admitting: Internal Medicine

## 2011-02-13 DIAGNOSIS — E1351 Other specified diabetes mellitus with diabetic peripheral angiopathy without gangrene: Secondary | ICD-10-CM

## 2011-02-13 DIAGNOSIS — Z931 Gastrostomy status: Secondary | ICD-10-CM

## 2011-02-13 DIAGNOSIS — D62 Acute posthemorrhagic anemia: Secondary | ICD-10-CM | POA: Diagnosis present

## 2011-02-13 DIAGNOSIS — B958 Unspecified staphylococcus as the cause of diseases classified elsewhere: Secondary | ICD-10-CM | POA: Diagnosis present

## 2011-02-13 DIAGNOSIS — E46 Unspecified protein-calorie malnutrition: Secondary | ICD-10-CM | POA: Diagnosis present

## 2011-02-13 DIAGNOSIS — I69391 Dysphagia following cerebral infarction: Secondary | ICD-10-CM

## 2011-02-13 DIAGNOSIS — K922 Gastrointestinal hemorrhage, unspecified: Principal | ICD-10-CM | POA: Diagnosis present

## 2011-02-13 DIAGNOSIS — R5381 Other malaise: Secondary | ICD-10-CM | POA: Diagnosis present

## 2011-02-13 DIAGNOSIS — R1310 Dysphagia, unspecified: Secondary | ICD-10-CM | POA: Diagnosis present

## 2011-02-13 DIAGNOSIS — I6930 Unspecified sequelae of cerebral infarction: Secondary | ICD-10-CM

## 2011-02-13 DIAGNOSIS — L89109 Pressure ulcer of unspecified part of back, unspecified stage: Secondary | ICD-10-CM | POA: Diagnosis present

## 2011-02-13 DIAGNOSIS — R410 Disorientation, unspecified: Secondary | ICD-10-CM

## 2011-02-13 DIAGNOSIS — I38 Endocarditis, valve unspecified: Secondary | ICD-10-CM | POA: Diagnosis present

## 2011-02-13 DIAGNOSIS — E119 Type 2 diabetes mellitus without complications: Secondary | ICD-10-CM | POA: Diagnosis present

## 2011-02-13 DIAGNOSIS — I10 Essential (primary) hypertension: Secondary | ICD-10-CM | POA: Diagnosis present

## 2011-02-13 DIAGNOSIS — Z933 Colostomy status: Secondary | ICD-10-CM

## 2011-02-13 DIAGNOSIS — I69959 Hemiplegia and hemiparesis following unspecified cerebrovascular disease affecting unspecified side: Secondary | ICD-10-CM

## 2011-02-13 DIAGNOSIS — I330 Acute and subacute infective endocarditis: Secondary | ICD-10-CM | POA: Diagnosis present

## 2011-02-13 DIAGNOSIS — L8993 Pressure ulcer of unspecified site, stage 3: Secondary | ICD-10-CM | POA: Diagnosis present

## 2011-02-13 DIAGNOSIS — B952 Enterococcus as the cause of diseases classified elsewhere: Secondary | ICD-10-CM | POA: Diagnosis present

## 2011-02-13 DIAGNOSIS — E039 Hypothyroidism, unspecified: Secondary | ICD-10-CM

## 2011-02-13 LAB — DIFFERENTIAL
Basophils Absolute: 0.1 10*3/uL (ref 0.0–0.1)
Basophils Relative: 2 % — ABNORMAL HIGH (ref 0–1)
Monocytes Relative: 10 % (ref 3–12)
Neutro Abs: 2.2 10*3/uL (ref 1.7–7.7)
Neutrophils Relative %: 41 % — ABNORMAL LOW (ref 43–77)

## 2011-02-13 LAB — CBC
Hemoglobin: 9.2 g/dL — ABNORMAL LOW (ref 13.0–17.0)
Platelets: 231 10*3/uL (ref 150–400)
RBC: 3.48 MIL/uL — ABNORMAL LOW (ref 4.22–5.81)
WBC: 5.3 10*3/uL (ref 4.0–10.5)

## 2011-02-13 LAB — COMPREHENSIVE METABOLIC PANEL
ALT: 14 U/L (ref 0–53)
AST: 25 U/L (ref 0–37)
Calcium: 10 mg/dL (ref 8.4–10.5)
Creatinine, Ser: 1.05 mg/dL (ref 0.50–1.35)
GFR calc Af Amer: 79 mL/min — ABNORMAL LOW (ref >90)
GFR calc non Af Amer: 68 mL/min — ABNORMAL LOW (ref >90)
Sodium: 142 mEq/L (ref 135–145)
Total Protein: 8 g/dL (ref 6.0–8.3)

## 2011-02-13 LAB — GLUCOSE, CAPILLARY

## 2011-02-14 ENCOUNTER — Inpatient Hospital Stay (HOSPITAL_COMMUNITY): Payer: Medicare Other

## 2011-02-14 LAB — GLUCOSE, CAPILLARY
Glucose-Capillary: 148 mg/dL — ABNORMAL HIGH (ref 70–99)
Glucose-Capillary: 151 mg/dL — ABNORMAL HIGH (ref 70–99)
Glucose-Capillary: 139 mg/dL — ABNORMAL HIGH (ref 70–99)
Glucose-Capillary: 115 mg/dL — ABNORMAL HIGH (ref 70–99)

## 2011-02-14 LAB — BASIC METABOLIC PANEL WITH GFR
BUN: 32 mg/dL — ABNORMAL HIGH (ref 6–23)
CO2: 23 meq/L (ref 19–32)
Calcium: 9.3 mg/dL (ref 8.4–10.5)
Chloride: 110 meq/L (ref 96–112)
Creatinine, Ser: 1.1 mg/dL (ref 0.50–1.35)
GFR calc Af Amer: 75 mL/min — ABNORMAL LOW
GFR calc non Af Amer: 65 mL/min — ABNORMAL LOW
Glucose, Bld: 142 mg/dL — ABNORMAL HIGH (ref 70–99)
Potassium: 3.6 meq/L (ref 3.5–5.1)
Sodium: 142 meq/L (ref 135–145)

## 2011-02-14 LAB — VANCOMYCIN, RANDOM: Vancomycin Rm: 27.1 ug/mL

## 2011-02-14 LAB — HEMOGLOBIN AND HEMATOCRIT, BLOOD: HCT: 26.4 % — ABNORMAL LOW (ref 39.0–52.0)

## 2011-02-14 LAB — FOLATE: Folate: >20 ng/mL

## 2011-02-14 LAB — IRON AND TIBC: TIBC: 204 ug/dL — ABNORMAL LOW (ref 215–435)

## 2011-02-14 LAB — FERRITIN: Ferritin: 199 ng/mL (ref 22–322)

## 2011-02-14 LAB — MRSA PCR SCREENING: MRSA by PCR: NEGATIVE

## 2011-02-14 MED ORDER — TECHNETIUM TC 99M-LABELED RED BLOOD CELLS IV KIT
25.0000 | PACK | Freq: Once | INTRAVENOUS | Status: AC | PRN
  Administered 2011-02-14: 30 via INTRAVENOUS

## 2011-02-14 NOTE — Discharge Summary (Signed)
NAMEGEORDAN, XU NO.:  [id number]  MEDICAL RECORD NO.:  [id number]  LOCATION:  1512                         FACILITY:  [facility]  PHYSICIAN:  Hartley Barefoot, MD    DATE OF BIRTH:  Mar 15, 1936  DATE OF ADMISSION:  01/19/2011 DATE OF DISCHARGE:  01/29/2011                        DISCHARGE SUMMARY - REFERRING   DISCHARGE DIAGNOSES: 1. Mitral valve endocarditis with enterococcus and a staphylococcal     coagulase bacteremia. 2. Encephalopathy, probably secondary to infection, patient back to     baseline. 3. Hypotension, probably secondary to decreased volume versus early     sepsis. 4. Dysphagia secondary to cerebrovascular accident.  Waiting for     speech therapy to consider restarting diet. 5. History of cerebrovascular accident, continue with aspirin. 6. Hypertension. 7. Diabetes type 2. 8. Deconditioning. 9. Severe protein calorie malnutrition. 10.Hyponatremia secondary to decreased volume, resolved. 11.Anemia, likely anemia of chronic disease and acute illness. 12.Urinary tract infection.  Finished 5 days of ceftriaxone. 13.Sacral decubitus ulcer, present since admission.  CONSULTANTS: 1. Judyann Munson, MD., Infectious Disease. 2. Vesta Mixer, M.D., Cardiology. 3. Salvatore Decent. Cornelius Moras, M.D., cardiovascular/thoracic surgeon.  PROCEDURE PERFORMED: 1. EEG show there is abnormal awake and drowsy EEG, no epileptiform     activity was noted. 2. TEE, October 10th show ejection fraction 60%, mitral valve moderate     sized vegetation on the atrial surface, primarily of the posterior     leaflet.  There is moderate mitral valve vegetation possible with     leaflet perforation.  No evidence of thrombus in the atrial cavity     or appendage. 3. Placement of a PICC line. 4. CT head.  No acute intracranial abnormality.  Moderate generalized     atrophy and moderate-to-severe chronic microvascular ischemic     changes of the white matter. 5. KUB.   Gastrotomy tube within the stomach with no extravasation. 6. Chest x-ray, slightly increased markings of left base, may     represent crowding of vessels.  BRIEF HISTORY OF PRESENT ILLNESS:  This is a very pleasant 75 year old African American with past medical history of CVA, hypertension, diabetes, history of bacteremia, and colostomy who has been getting his care at [facility] until October 1.  Subsequently, he was transferred to [facility] in [place] for rehab.  The wife provided most of the history.  He was reported that after transfer the patient was sleepy most of the day over the last 3 days.  However the day of admission, the patient had dislodged his G-tube in the morning.  The patient was started on levofloxacin the day prior to admission for pneumonia.  The patient was also found to be hypotensive in the emergency department with blood pressure in the 84/48, which improved after IV fluids in the ED.  Per wife, no recent fevers or chills.  No chest pain.  No shortness of breath.  HOSPITAL COURSE: 1. Encephalopathy secondary to infections, bacteremia.  Patient had a     CT that was negative.  After treatment with antibiotics patient is     now almost back to his baseline. 2. Mitral valve endocarditis, enterococcus and Staph coagulase  bacteremia.  Patient had blood cultures done that show enterococcus     faecalis and Staphylococcus coagulase negative on October 5.     Repeated blood culture on October 7th with persistence of this     organism.  Blood cultures on October 10th show just 1 of 2 gram-     positive cocci in cluster, might be related to a Staph coagulase.     We are going to continue patient on vancomycin and gentamicin.     Patient had a TEE which showed a vegetation and possible left lead     perforation.  Cardiology and cardiovascular thoracic surgeons were     consulted.  Dr. Cornelius Moras recommended no surgery at this time.  Patient     is in not heart  failure and recommend to continue with IV     antibiotics.  He will be available in the future if any surgery is     needed.    Multiple attempts were made to try to get records from [facility].  In     one of the attempts, [facility] said that the patient was never in that     hospital.  I asked multiple times to get the records and I even     spoke with the wife and she said that he was at [facility] and we     requested the records again without any response.  His primary care     doctor will need to try to get records from [facility].  Patient will     need the IV antibiotics for at least 6 weeks.  He might need to     have a repeated TEE for further evaluation.  He will need to follow     up with Dr. Drue Second, Infectious Disease, and will need also to     follow up with [facility], Cardiology, [phone].  He will need     gentamicin level per pharmacy protocol and he will also need     vancomycin level.  His vancomycin goal is 15 to 20.  He will need     monitor of his kidney function. 4. Severe protein calorie malnutrition.  We will continue with tube     feeding. 5. Diabetes.  We will add long-acting Lantus.  Consider a sliding     scale insulin as needed. 6. Anemia of chronic disease and acute illness.  Hemoglobin decreased     to 7.7.  He will receive 1 unit of blood today.  We will repeat     hemoglobin in the morning. 7. History of CVA.  Continue with aspirin. 8. All other chronic medical problems remain stable.  DISPOSITION: 1. Patient will have blood cultures done today.  His blood culture     results will need to be followed up. 2. On the day of prior to discharge, blood pressure 118/69,     respirations 16, pulse 71 temp 97.6, sat 100 on 2 L.  Sodium 139,     potassium 3.5, chloride 107, bicarb 30, glucose 169, BUN 15,     creatinine 0.61.  White blood cell 4.8, hemoglobin 7.7, will     receive 1 unit of packed red blood cells, platelet 188.  Gentamicin     trough 1.5.     Hartley Barefoot,  MD     BR/MEDQ  D:  01/28/2011  T:  01/29/2011  Job:  [id number]  Electronically Signed by Hartley Barefoot MD on 02/14/2011 04:05:28 PM

## 2011-02-15 LAB — BASIC METABOLIC PANEL
BUN: 22 mg/dL (ref 6–23)
CO2: 24 mEq/L (ref 19–32)
Chloride: 108 mEq/L (ref 96–112)
Creatinine, Ser: 1.05 mg/dL (ref 0.50–1.35)

## 2011-02-15 LAB — CBC
HCT: 27.8 % — ABNORMAL LOW (ref 39.0–52.0)
MCV: 86.3 fL (ref 78.0–100.0)
RBC: 3.22 MIL/uL — ABNORMAL LOW (ref 4.22–5.81)
WBC: 4.8 10*3/uL (ref 4.0–10.5)

## 2011-02-15 LAB — GLUCOSE, CAPILLARY: Glucose-Capillary: 114 mg/dL — ABNORMAL HIGH (ref 70–99)

## 2011-02-15 LAB — HEMOGLOBIN AND HEMATOCRIT, BLOOD: HCT: 26.1 % — ABNORMAL LOW (ref 39.0–52.0)

## 2011-02-15 LAB — PREPARE RBC (CROSSMATCH)

## 2011-02-15 NOTE — Consult Note (Signed)
[name], [name]             ACCOUNT NO.:  [id number]  MEDICAL RECORD NO.:  [id number]  LOCATION:  5522                         FACILITY:  [facility]  PHYSICIAN:  Willis Modena, MD     DATE OF BIRTH:  [birth year]  DATE OF CONSULTATION:  02/14/2011 DATE OF DISCHARGE:                                CONSULTATION   REQUESTING PHYSICIAN:  Osvaldo Shipper, MD  REASON FOR CONSULTATION:  Blood through colostomy.  CHIEF COMPLAINT:  Blood through colostomy.  HISTORY OF PRESENT ILLNESS:  [name] is a 75 year old gentleman with a history of stroke and right hemiparesis.  He is basically unable to communicate.  He was recently discharged after having sepsis from endocarditis and is on antibiotics.  He presented to the hospital with blood through his colostomy bag.  I have talked to his wife, [name], at [phone], in detail.  She tells me that he had a couple prior episodes of bleeding over the past few weeks, but I do not see any records of that in our hospital system.  He has had a colonoscopy many years ago, which was reportedly negative.  He has a history of a bad sacral decubitus and underwent a diverting colostomy at [facility] some time in the past.  He has a chronic PEG tube from his stroke but no blood has been coming out of that.  The patient is unable to provide any history.  Past medical history, past surgical history, home medications, allergies, family history, social history, review of systems all from the dictated note from Dr. Osvaldo Shipper dated February 13, 2011.  I have reviewed and I agree.  PHYSICAL EXAMINATION:  VITAL SIGNS:  Blood pressure 152/90, heart rate 87, respiratory rate 20, temperature 97.8, oxygen saturation 99% on room air. GENERAL:  [name] is contracted, does not appear acutely toxic, but is not able to communicate. NEUROLOGIC:  He has contractures, unable to answer any questions.  He does open his eyes. LUNGS:  Clear without obvious rales, rhonchi, or  wheezes. HEART:  Regular rhythm, normal rate. ABDOMEN:  Soft.  He has a midline PEG tube in the left lower quadrant, diverting colostomy.  There is some maroon blood coming into the colostomy.  He apparently has emptied about 500 mL over the past 12 hour hospital shift. SKIN:  He apparently has a severe sacral decubitus which has not been examined by me.  LABORATORY STUDIES:  Hemoglobin 9.2, it was about 10.5 when he was discharged a couple of weeks ago.  White count 5.3, platelet count 231. Sodium 142, potassium 3.6, chloride 110, bicarb 23, BUN 32, creatinine 1.1.  Liver tests normal except for a low albumin at 3.1.  RADIOLOGIC STUDIES:  None.  IMPRESSION:  [name] is a 75 year old gentleman with recent sepsis from endocarditis.  He presents for blood within his colostomy.  No further history can be obtained from the patient.  He is hemodynamically stable.  He does have a diverting colostomy placed due to severe sacral decubitus.  PLAN: 1. Suggest serial CBCs and proton pump inhibitor therapy, as you are     doing. 2. We will obtain a tagged red blood cell study to help localize  bleeding source. 3. We will try to get records from [facility] regarding colostomy placement. 4. I have discussed at length with the patient's wife, [name].  If the     patient's tagged scan is positive, we will attempt to have     Interventional Radiology evaluation for angiography.  If tagged red     blood cell study is negative and his bleeding persists, one would     have to consider a colonoscopy through the colostomy.     Willis Modena, MD     WO/MEDQ  D:  02/14/2011  T:  02/14/2011  Job:  [id number]  Electronically Signed by Willis Modena  on 02/15/2011 03:26:21 PM

## 2011-02-15 NOTE — H&P (Signed)
[name], [name]             ACCOUNT NO.:  [id number]  MEDICAL RECORD NO.:  [id number]  LOCATION:  [facility]                         FACILITY:  [facility]  PHYSICIAN:  Keith Shipper, MD     DATE OF BIRTH:  Apr 10, 1936  DATE OF ADMISSION:  02/13/2011 DATE OF DISCHARGE:                             HISTORY & PHYSICAL   PRIMARY CARE PHYSICIAN:  Keith Spikes, DO  The patient resides in [facility].  The patient was recently admitted to our system from January 19, 2011 through January 28, 2011.  ADMISSION DIAGNOSES: 1. Blood in colostomy bag, possible lower gastrointestinal bleeding. 2. History of stroke with right hemiparesis. 3. Recently diagnosed mitral valve endocarditis, on antibiotics. 4. History of hypothyroidism. 5. History of hypertension. 6. History of anorexia. 7. History of dysphagia.  CHIEF COMPLAINT:  Blood in the ostomy bag.  HISTORY OF PRESENT ILLNESS:  The patient is a 75 year old African American male with a past medical history of stroke, hypertension, pneumonia, anorexia, endocarditis who was in his usual state of health till earlier today when in the nursing home it was noted that he had blood in his ostomy bag.  The patient was subsequently sent over to the emergency department for further evaluation.  According to the ED physician, there was about 30 to 50 mL of blood in the back.  There was no active bleeding that was noted.  The patient denies any abdominal pain, nausea, vomiting, fever or chills.  There was no lightheadedness. The patient is bed bound.  Denies taking any blood thinners at home. Denies any history of constipation to the ostomy bag.  As far as we know, he has never had similar symptoms the past.  There has been no history of any blood through the PEG tube.  No known history of colonoscopy.  MEDICATIONS:  At home include the following. 1. Vancomycin, it is unclear exactly which dose he is getting, it is     either 1250 or 1500 mg  every day. 2. Metoprolol 25 mg twice daily. 3. Acetaminophen every 6 hours as needed 640 mg solution. 4. Atropine ophthalmic solution 1% orally 2 drops every 6 hours. 5. Senokot 2 tablets twice daily. 6. Ranitidine 75 mg 2 tablets twice daily. 7. Florastor 1 capsule twice daily. 8. Ferrous sulfate via tube 300 mg twice daily. 9. Thiamine 100 mg every morning. 10.Synthroid 300 mcg daily. 11.Pravastatin 40 mg daily at bedtime. 12.Potassium chloride oral solution via tube, 7.5 mL every morning. 13.Mirtazapine 15 mg at bedtime. 14.Metformin 500 mg every morning. 15.Lantus insulin 10 units subcu daily. 16.Gabapentin 300 mg daily at bedtime. 17.Furosemide 20 mg every morning. 18.Celexa 10 mg every morning. 19.Aspirin 325 mg every morning. 20.Vitamin C 500 mg twice daily. 21.Zinc sulfate 220 mg every morning. 22.Multivitamins 1 tablet every morning. 23.Gentamicin 80 mg twice daily intravenously.  ALLERGIES:  No known drug allergies.  PAST MEDICAL HISTORY: 1. Consists of recently diagnosed mitral valve endocarditis with     enterococcus and Staphylococcus coagulase bacteremia.  He is     supposed to be on vancomycin and gentamicin for 6 weeks, although     the end date is not entirely clear. 2. Recent encephalopathy  secondary to infection, resolved. 3. Recent hypotension from sepsis, resolved. 4. Dysphagia, although it is unclear if he is getting anything orally.     The patient was not entirely clear, although in the end he did tell     me that he was not taking anything by mouth. 5. History of stroke, stable. 6. History of hypertension and diabetes, stable. 7. Deconditioning. 8. Protein-calorie malnutrition. 9. Anemia. 10.History of UTI. 11.History of pressure ulcers present since the previous admission.  SURGICAL HISTORY:  He has had a PEG tube and a colostomy bag and hernia repair/PEG tube and colostomy bags all of which were last year.  This was done at [facility].  SOCIAL HISTORY:  The patient lives in [facility] which is a nursing home in [place].  There is no history of smoking, alcohol, illicit drug use.  FAMILY HISTORY:  Positive for breast cancer in his mother.  REVIEW OF SYSTEMS:  GENERAL:  Positive for weakness.  HEENT: Unremarkable.  CARDIOVASCULAR:  Unremarkable.  RESPIRATORY: Unremarkable.  GI:  As in HPI.  GU:  Unremarkable.  NEUROLOGICAL: Unremarkable.  PSYCHIATRIC:  Unremarkable.  DERMATOLOGICAL: Unremarkable.  Other systems reviewed and found to be negative.  PHYSICAL EXAMINATION:  VITAL SIGNS:  Temperature 97.4, blood pressure 144/94, heart rate 76, respiratory rate 16, saturation 100% on room air. GENERAL:  Overweight African American male in no distress. HEENT:  Head is normocephalic, atraumatic.  Pupils are equal, reacting. No pallor.  No icterus.  Oral mucous membrane is moist.  No oral lesions noted. NECK:  Soft and supple.  No thyromegaly is appreciated. LUNGS:  Clear to auscultation bilaterally with no wheezing, rales, or rhonchi. CARDIOVASCULAR:  S1, S2 is normal, regular.  No S3 or S4.  No rubs, murmurs, or bruits are heard. ABDOMEN:  Soft.  There is a PEG tube.  There is no erythema.  No tenderness around that site.  There is a colostomy bag and there is some fresh blood noted in the bag, although no active bleeding is present. GU:  He has got a Foley catheter that he tells me was placed in the emergency department. NEUROLOGIC:  He has right hemiparesis. SKIN:  He has got a few wounds in the lower extremities.  I could not examine the back at this time.  LABORATORY DATA:  His white cell count is 5.3, hemoglobin is 9.2, his hemoglobin on January 29, 2011 was 8.7.  He tends to run between 8 and 9.  His platelet count is 231.  Electrolytes are normal.  BUN is 32, creatinine is 1.05.  LFTs are normal.  Albumin is 3.1.  IMAGING STUDIES:  No imaging studies have been done.  EKG is  available which shows sinus rhythm at 72 with normal axis.  He does have first degree AV block.  No other abnormal intervals are noted. No Q-waves.  No concerning ST or T-waves changes are seen on this EKG.  This was compared to an EKG from earlier this month and all the changes are stable.  ASSESSMENT:  This is a 75 year old African American male with history as mentioned earlier who presents after he was found to have blood in his ostomy bag.  Reason for his lower GI bleeding is not entirely clear.  No blood has been seen from the PEG tube.  The patient denies any recent issue with constipation.  No mention of hard stools from the ostomy bag. He is not on full anticoagulation.  He is  just on aspirin.  PLAN: 1. Blood in the ostomy bag.  CBCs will be monitored closely.  Anemia     panel will be checked.  He does have chronic anemia and is already     on iron sulfate.  I have already discussed this case with Dr.     Matthias Hughs with [facility] and he will evaluate this patient in the     morning.  The patient may require a colonoscopy. 2. Recent mitral valve endocarditis.  Continue with vancomycin and     gentamicin intravenously.  We will ask pharmacy to help doses.  The     exact end date on these two antibiotics are not entirely clear.  He     was supposed to get these for 6 weeks.  He was discharged on     January 28, 2011, but unfortunately there was no mention of a end     date in the discharge summary. 3. Anemia, please see above. 4. History of stroke with right hemiparesis, stable. 5. Dysphagia.  He did have a swallow evaluation during the previous     hospitalization.  We will get a copy of that to see if he can have     anything orally. 6. History of hypertension.  Continue with metoprolol with holding     parameters. 7. History of diabetes.  I will put him on a sliding scale.  Monitor     CBG q.6 h. 8. Nutrition.  Until the GI bleeding issue is clarified, we will hold     his  PEG tube feeds for now. 9. PPI will be given intravenously. 10.Pressure ulcers.  He will be seen by the wound care nurse. 11.The patient is a full code.  Further management decisions will depend on results of further testing and patient's response to treatment.    Keith Shipper, MD     GK/MEDQ  D:  02/13/2011  T:  02/13/2011  Job:  [id number]  cc:   Bernette Redbird, M.D. Keith Spikes, DO  Electronically Signed by Keith Shipper MD on 02/15/2011 07:39:23 PM

## 2011-02-16 ENCOUNTER — Other Ambulatory Visit: Payer: Self-pay | Admitting: Gastroenterology

## 2011-02-16 LAB — CBC
HCT: 26.5 % — ABNORMAL LOW (ref 39.0–52.0)
Hemoglobin: 8.3 g/dL — ABNORMAL LOW (ref 13.0–17.0)
MCH: 26.8 pg (ref 26.0–34.0)
MCV: 85.5 fL (ref 78.0–100.0)
RBC: 3.1 MIL/uL — ABNORMAL LOW (ref 4.22–5.81)

## 2011-02-16 LAB — BASIC METABOLIC PANEL
BUN: 17 mg/dL (ref 6–23)
CO2: 23 mEq/L (ref 19–32)
Calcium: 9 mg/dL (ref 8.4–10.5)
Creatinine, Ser: 1.26 mg/dL (ref 0.50–1.35)
Glucose, Bld: 140 mg/dL — ABNORMAL HIGH (ref 70–99)

## 2011-02-16 LAB — GLUCOSE, CAPILLARY
Glucose-Capillary: 162 mg/dL — ABNORMAL HIGH (ref 70–99)
Glucose-Capillary: 126 mg/dL — ABNORMAL HIGH (ref 70–99)

## 2011-02-16 LAB — HEMOGLOBIN AND HEMATOCRIT, BLOOD
HCT: 25.8 % — ABNORMAL LOW (ref 39.0–52.0)
Hemoglobin: 8.2 g/dL — ABNORMAL LOW (ref 13.0–17.0)

## 2011-02-16 LAB — GENTAMICIN LEVEL, TROUGH: Gentamicin Trough: 2.1 ug/mL (ref 0.5–2.0)

## 2011-02-17 LAB — CBC
HCT: 27.3 % — ABNORMAL LOW (ref 39.0–52.0)
MCHC: 31.1 g/dL (ref 30.0–36.0)
MCV: 85.6 fL (ref 78.0–100.0)
Platelets: 225 10*3/uL (ref 150–400)
RDW: 18.6 % — ABNORMAL HIGH (ref 11.5–15.5)

## 2011-02-17 LAB — TYPE AND SCREEN
ABO/RH(D): B POS
Antibody Screen: NEGATIVE
Unit division: 0
Unit division: 0

## 2011-02-17 LAB — BASIC METABOLIC PANEL
BUN: 12 mg/dL (ref 6–23)
Creatinine, Ser: 1.24 mg/dL (ref 0.50–1.35)
GFR calc Af Amer: 65 mL/min — ABNORMAL LOW (ref >90)
GFR calc non Af Amer: 56 mL/min — ABNORMAL LOW (ref >90)
Potassium: 3.7 mEq/L (ref 3.5–5.1)

## 2011-02-17 LAB — GLUCOSE, CAPILLARY
Glucose-Capillary: 136 mg/dL — ABNORMAL HIGH (ref 70–99)
Glucose-Capillary: 140 mg/dL — ABNORMAL HIGH (ref 70–99)

## 2011-02-17 MED ORDER — LEVOTHYROXINE SODIUM 300 MCG PO TABS
300.0000 ug | ORAL_TABLET | Freq: Every day | ORAL | Status: DC
  Filled 2011-02-17 (×3): qty 1

## 2011-02-17 MED ORDER — ACETAMINOPHEN 325 MG PO TABS: 650.0000 mg | ORAL_TABLET | ORAL | Status: DC | PRN

## 2011-02-17 MED ORDER — PRAVASTATIN SODIUM 40 MG PO TABS
40.0000 mg | ORAL_TABLET | Freq: Every day | ORAL | Status: DC
  Administered 2011-02-19: 40 mg
  Filled 2011-02-17 (×4): qty 1

## 2011-02-17 MED ORDER — PANTOPRAZOLE SODIUM 40 MG IV SOLR
40.0000 mg | Freq: Two times a day (BID) | INTRAVENOUS | Status: DC
  Administered 2011-02-17 – 2011-02-20 (×5): 40 mg via INTRAVENOUS
  Filled 2011-02-17 (×8): qty 40

## 2011-02-17 MED ORDER — ACETAMINOPHEN 160 MG/5ML PO SOLN
650.0000 mg | Freq: Four times a day (QID) | ORAL | Status: DC | PRN
  Filled 2011-02-17: qty 20.3

## 2011-02-17 MED ORDER — GLUCERNA 1.2 CAL PO LIQD
1000.0000 mL | ORAL | Status: DC
  Filled 2011-02-17: qty 1000

## 2011-02-17 MED ORDER — CHLORHEXIDINE GLUCONATE 0.12 % MT SOLN
15.0000 mL | Freq: Two times a day (BID) | OROMUCOSAL | Status: DC
  Administered 2011-02-18 (×2): 15 mL via OROMUCOSAL
  Filled 2011-02-17 (×9): qty 15

## 2011-02-17 MED ORDER — MORPHINE SULFATE 2 MG/ML IJ SOLN: 2.0000 mg | INTRAMUSCULAR | Status: DC | PRN

## 2011-02-17 MED ORDER — BIOTENE DRY MOUTH MT LIQD
15.0000 mL | Freq: Two times a day (BID) | OROMUCOSAL | Status: DC
  Administered 2011-02-18 – 2011-02-19 (×3): 15 mL via OROMUCOSAL

## 2011-02-17 MED ORDER — VITAMIN B-1 100 MG PO TABS
100.0000 mg | ORAL_TABLET | Freq: Every day | ORAL | Status: DC
  Administered 2011-02-20: 100 mg via ORAL
  Filled 2011-02-17 (×4): qty 1

## 2011-02-17 MED ORDER — DEXTROSE 5 % AND 0.45 % NACL IV BOLUS: 100.0000 mL | Freq: Once | INTRAVENOUS | Status: DC

## 2011-02-17 MED ORDER — ATROPINE SULFATE 1 % OP SOLN
2.0000 [drp] | Freq: Four times a day (QID) | OPHTHALMIC | Status: DC
  Administered 2011-02-20 (×2): 2 [drp] via SUBLINGUAL
  Filled 2011-02-17: qty 2

## 2011-02-17 MED ORDER — GABAPENTIN 250 MG/5ML PO SOLN
300.0000 mg | Freq: Every day | ORAL | Status: DC
  Administered 2011-02-19: 300 mg
  Filled 2011-02-17 (×6): qty 6

## 2011-02-17 MED ORDER — ONDANSETRON HCL 4 MG/2ML IJ SOLN: 4.0000 mg | Freq: Four times a day (QID) | INTRAMUSCULAR | Status: DC | PRN

## 2011-02-17 MED ORDER — PROSOURCE NO CARB PO LIQD
30.0000 mL | Freq: Every day | ORAL | Status: DC
  Filled 2011-02-17 (×6): qty 30

## 2011-02-17 MED ORDER — METOPROLOL TARTRATE 25 MG/10 ML ORAL SUSPENSION
25.0000 mg | Freq: Two times a day (BID) | ORAL | Status: DC
  Administered 2011-02-17: 25 mg
  Filled 2011-02-17 (×4): qty 10

## 2011-02-17 MED ORDER — ALBUTEROL SULFATE (5 MG/ML) 0.5% IN NEBU
2.5000 mg | INHALATION_SOLUTION | Freq: Four times a day (QID) | RESPIRATORY_TRACT | Status: DC | PRN
  Filled 2011-02-17: qty 0.5

## 2011-02-17 MED ORDER — GENTAMICIN IN SALINE 0.8-0.9 MG/ML-% IV SOLN
80.0000 mg | INTRAVENOUS | Status: DC
  Administered 2011-02-18 – 2011-02-20 (×3): 80 mg via INTRAVENOUS
  Filled 2011-02-17 (×4): qty 100

## 2011-02-17 MED ORDER — MIRTAZAPINE 15 MG PO TABS
15.0000 mg | ORAL_TABLET | Freq: Every day | ORAL | Status: DC
  Administered 2011-02-19: 15 mg
  Filled 2011-02-17 (×4): qty 1

## 2011-02-17 MED ORDER — INSULIN ASPART 100 UNIT/ML ~~LOC~~ SOLN
0.0000 [IU] | SUBCUTANEOUS | Status: DC
  Administered 2011-02-17 – 2011-02-20 (×8): 2 [IU] via SUBCUTANEOUS
  Filled 2011-02-17: qty 3

## 2011-02-17 MED ORDER — THERA M PLUS PO TABS
1.0000 | ORAL_TABLET | Freq: Every day | ORAL | Status: DC
  Administered 2011-02-20: 1 via ORAL
  Filled 2011-02-17 (×4): qty 1

## 2011-02-17 MED ORDER — FUROSEMIDE 8 MG/ML PO SOLN
20.0000 mg | Freq: Every day | ORAL | Status: DC
  Administered 2011-02-20: 20 mg
  Filled 2011-02-17 (×4): qty 5

## 2011-02-17 MED ORDER — CITALOPRAM HYDROBROMIDE 10 MG PO TABS
10.0000 mg | ORAL_TABLET | Freq: Every day | ORAL | Status: DC
  Administered 2011-02-20: 10 mg
  Filled 2011-02-17 (×2): qty 0.5
  Filled 2011-02-17: qty 1
  Filled 2011-02-17: qty 0.5

## 2011-02-17 MED ORDER — VANCOMYCIN HCL 1000 MG IV SOLR
750.0000 mg | INTRAVENOUS | Status: DC
  Administered 2011-02-19 – 2011-02-20 (×2): 750 mg via INTRAVENOUS
  Filled 2011-02-17 (×4): qty 750

## 2011-02-17 NOTE — Progress Notes (Signed)
[name], [name]             ACCOUNT NO.:  [id number]  MEDICAL RECORD NO.:  [id number]  LOCATION:  5522                         FACILITY:  [facility]  PHYSICIAN:  Altha Harm, MDDATE OF BIRTH:  Nov 05, 1935                                PROGRESS NOTE   CHIEF COMPLAINT: The chief complaint on this gentleman was blood in his colostomy bag.  INTERVAL HISTORY: The patient had an EGD done on February 16, 2011, which was found to be normal.  The PEG tube bumper was in place.  He also had a colonoscopy via the colostomy, which revealed friable mucosa diffusely in the distal limb of the colostomy, appearance is not typical of colitis, and he also had prominent ileocecal valve biopsied; otherwise, it was a normal colostomy.  Since yesterday, the patient has had no further overt bleeding, and he has had his Glucerna restarted and is presently advancing to a goal rate of 65 mL/hr.  The patient today states that he has no complaints except that he said that he is extremely tired after having been disturbed overnight every hour for rounding apparently by the nursing staff.  The patient is requesting that he will not be disturbed during the night so that he can have recuperative and restful sleep.  PHYSICAL EXAMINATION: VITAL SIGNS:  Today, his temperature is 98.3, heart rate is 99, respirations of 20, blood pressure is 110/76, O2 sats are 100% on room air. HEENT:  He is normocephalic, atraumatic.  Pupils are equally round and reactive to light.  Oropharynx is moist.  No exudate, erythema, or lesions.  The patient is unable to manage his oral secretions and has to have a Yankauer suction to assist with it. NECK:  Trachea is midline.  No masses.  No thyromegaly.  No JVD.  No carotid bruit. RESPIRATORY:  He has got a normal respiratory effort.  Equal excursion bilaterally.  No wheezing or rhonchi noted. CARDIOVASCULAR:  He has got a normal S1 and S2.  No heaves or thrills  on palpation.  PMI is nondisplaced. ABDOMEN:  Soft, nontender, nondistended.  Colostomy appears to be draining nonbloody drainage.  PEG tube site is clean, dry, and intact. There are no masses.  No hepatosplenomegaly noted. EXTREMITIES:  Not atrophied.  There is no clubbing, cyanosis, or edema. SKIN:  The patient has a healing stage 3 wound on the sacrum, which is about 97% red, and the patient has a foam dressing to the wound at present.   Blood sugars today are ranging from 134 to 162.  PERTINENT LABORATORY STUDIES: Sodium 139, potassium 3.7, chloride 108, bicarb 22, BUN 12, creatinine 1.24.  White blood cell count is 5.1, hemoglobin 8.5, hematocrit 27.3, platelet count 225.  ASSESSMENT: 1. Gastrointestinal bleed.  The patient's gastrointestinal bleed is     likely coming from the viable tissue in the colostomy.  So far, his     hemoglobin has remained stable, and he has had no further overt     bleeding.  The patient's tube feelings have been advanced to a goal     of 65 mL/hr, and we will watch for bleeding and check the     hemoglobin in the  morning. 2. Recent mitral valve endocarditis.  The patient is presently on     vancomycin and gentamicin titrated per pharmacy, and he is to be on     that until March 10, 2011. 3. Dysphagia.  The patient is receiving his feedings and his oral     medications via his PEG tube.  He is tolerating them well so far,     and we will continue to a goal of 65 mL/hr on his tube feedings. 4. Hypothyroidism.  The patient shows no signs and symptoms of     disruption of thyroid function; however, we will check a TSH on the     patient for baseline. 5. Hypertension, blood pressure well controlled on current regimen. 6. Diabetes type 2, blood sugar is well controlled on current regimen.  PLAN: To get the patient to goal on his feedings.  Recheck his hemoglobin to ensure that his hemoglobin remained stable with those goals are met, and there are  no further acute issues in the patient.  The patient's disposition will be back to skilled facility.     Altha Harm, MD     MAM/MEDQ  D:  02/17/2011  T:  02/17/2011  Job:  [id number]  Electronically Signed by Marthann Schiller MD on 02/17/2011 06:19:36 PM

## 2011-02-18 LAB — BASIC METABOLIC PANEL
CO2: 23 mEq/L (ref 19–32)
Calcium: 9.1 mg/dL (ref 8.4–10.5)
Creatinine, Ser: 1.37 mg/dL — ABNORMAL HIGH (ref 0.50–1.35)
GFR calc non Af Amer: 50 mL/min — ABNORMAL LOW (ref >90)

## 2011-02-18 LAB — GLUCOSE, CAPILLARY
Glucose-Capillary: 111 mg/dL — ABNORMAL HIGH (ref 70–99)
Glucose-Capillary: 111 mg/dL — ABNORMAL HIGH (ref 70–99)
Glucose-Capillary: 116 mg/dL — ABNORMAL HIGH (ref 70–99)
Glucose-Capillary: 114 mg/dL — ABNORMAL HIGH (ref 70–99)

## 2011-02-18 LAB — CBC
MCH: 26.6 pg (ref 26.0–34.0)
MCHC: 31.5 g/dL (ref 30.0–36.0)
MCV: 84.5 fL (ref 78.0–100.0)
Platelets: 176 10*3/uL (ref 150–400)
RBC: 2.97 MIL/uL — ABNORMAL LOW (ref 4.22–5.81)

## 2011-02-18 LAB — PREPARE RBC (CROSSMATCH)

## 2011-02-18 LAB — HEMOGLOBIN AND HEMATOCRIT, BLOOD: HCT: 26.1 % — ABNORMAL LOW (ref 39.0–52.0)

## 2011-02-18 LAB — TSH: TSH: 6.055 u[IU]/mL — ABNORMAL HIGH (ref 0.350–4.500)

## 2011-02-18 MED ORDER — DEXTROSE-NACL 5-0.45 % IV SOLN
INTRAVENOUS | Status: DC
  Administered 2011-02-18: 06:00:00 via INTRAVENOUS
  Administered 2011-02-19: 100 mL/h via INTRAVENOUS
  Administered 2011-02-19: 22:00:00 via INTRAVENOUS
  Administered 2011-02-20: 980 mL via INTRAVENOUS

## 2011-02-18 MED ORDER — SODIUM CHLORIDE 0.9 % IV SOLN
INTRAVENOUS | Status: DC
  Administered 2011-02-18: 250 mL via INTRAVENOUS

## 2011-02-18 MED ORDER — METOPROLOL TARTRATE 1 MG/ML IV SOLN
5.0000 mg | Freq: Three times a day (TID) | INTRAVENOUS | Status: DC
  Filled 2011-02-18 (×2): qty 5

## 2011-02-18 MED ORDER — METOPROLOL TARTRATE 1 MG/ML IV SOLN
5.0000 mg | Freq: Three times a day (TID) | INTRAVENOUS | Status: DC
  Administered 2011-02-18 – 2011-02-20 (×6): 5 mg via INTRAVENOUS
  Filled 2011-02-18 (×8): qty 5

## 2011-02-18 NOTE — Progress Notes (Signed)
Subjective: Patient is without any complaints today his wife and sister-in-law present at the bedside.  Interval history: Overnight the patient had a malfunction of his feeding tube. The patient's tube feedings were discontinued. This consult he'll need for interventional radiology to evaluate the tube for patency and need for revision. Filed Vitals:   02/17/11 0211 02/17/11 1630 02/18/11 0609 02/18/11 1403  BP: 156/89 131/80 136/77 129/74  Pulse: 78 76 72 73  Temp: 97.4 F (36.3 C) 98.3 F (36.8 C) 98 F (36.7 C) 98 F (36.7 C)  TempSrc: Oral Oral Oral Oral  Resp: 20 19 18 18   Height:      Weight:   98.9 kg (218 lb 0.6 oz)   SpO2:   100% 100%   Weight change:   Intake/Output Summary (Last 24 hours) at 02/18/11 1801 Last data filed at 02/18/11 0852  Gross per 24 hour  Intake    800 ml  Output    750 ml  Net     50 ml    General: Alert, awake, oriented x3, in no acute distress.  HEENT: [place]/AT PEERL, EOMI Neck: Trachea midline,  no masses, no thyromegal,y no JVD, no carotid bruit OROPHARYNX:  Moist, No exudate/ erythema/lesions.  RESPIRATORY: He has got a normal respiratory effort. Equal excursion  bilaterally. No wheezing or rhonchi noted.  CARDIOVASCULAR: He has got a normal S1 and S2. No heaves or thrills on  palpation. PMI is nondisplaced.  ABDOMEN: Soft, nontender, nondistended. Colostomy appears to be  draining nonbloody drainage. PEG tube site is clean, dry, and intact.  There are no masses. No hepatosplenomegaly noted.  EXTREMITIES:  Atrophied on the right with decreased range of motion of the right shoulder. There is no clubbing, cyanosis, or edema.  SKIN: The patient has a healing stage 3 wound on the sacrum, which is  about 97% red, and the patient has a foam dressing to the wound at  present.    Lab Results:  Basename 02/18/11 0600 02/17/11 0540  NA 138 139  K 3.1* 3.7  CL 108 108  CO2 23 22  GLUCOSE 111* 147*  BUN 14 12  CREATININE 1.37* 1.24    CALCIUM 9.1 9.1  MG -- --  PHOS -- --   No results found for this basename: AST:2,ALT:2,ALKPHOS:2,BILITOT:2,PROT:2,ALBUMIN:2 in the last 72 hours No results found for this basename: LIPASE:2,AMYLASE:2 in the last 72 hours  Basename 02/18/11 0600 02/17/11 0540  WBC 3.9* 5.1  NEUTROABS -- --  HGB 7.9* 8.5*  HCT 25.1* 27.3*  MCV 84.5 85.6  PLT 176 225   No results found for this basename: CKTOTAL:3,CKMB:3,CKMBINDEX:3,TROPONINI:3 in the last 72 hours No results found for this basename: POCBNP:3 in the last 72 hours No results found for this basename: DDIMER:2 in the last 72 hours No results found for this basename: HGBA1C:2 in the last 72 hours No results found for this basename: CHOL:2,HDL:2,LDLCALC:2,TRIG:2,CHOLHDL:2,LDLDIRECT:2 in the last 72 hours  Basename 02/18/11 0600  TSH 6.055*  T4TOTAL --  T3FREE --  THYROIDAB --   No results found for this basename: VITAMINB12:2,FOLATE:2,FERRITIN:2,TIBC:2,IRON:2,RETICCTPCT:2 in the last 72 hours  Micro Results: Recent Results (from the past 240 hour(s))  MRSA PCR SCREENING     Status: Normal   Collection Time   02/13/11 11:52 PM      Component Value Range Status Comment   MRSA by PCR NEGATIVE  NEGATIVE  Final     Studies/Results: Nm Gi Blood Loss  02/14/2011  *RADIOLOGY REPORT*  Clinical Data:  GI bleed.  Left abdominal colostomy without further clinical history.  Bleeding through colostomy.  NUCLEAR MEDICINE GASTROINTESTINAL BLEEDING STUDY  Technique:  Sequential abdominal images were obtained following intravenous administration of Tc-79m labeled red blood cells.  Radiopharmaceutical: 30 mCi technetium 92m labeled red blood cells.  Comparison: None.  Findings: Imaging was obtained for 2 hours following injection of isotope.  There is good red blood cell tagging.  There is activity in the aorta and  iliacs.  There is progressive activity within the urinary bladder.  Filling defect in the bladder is felt to be a Foley catheter  balloon.  No evidence of GI bleeding at this time.  IMPRESSION: Negative for GI bleeding 2 hours.  Further imaging can be performed if there is additional bleeding.  Original Report Authenticated By: Camelia Phenes, M.D.   Ir Fluoro Guide Cv Line Left  01/26/2011  *RADIOLOGY REPORT*  Clinical Data: Poor peripheral veins, no current access for IV therapy  PICC LINE PLACEMENT WITH ULTRASOUND AND FLUOROSCOPIC  GUIDANCE  Fluoroscopy Time: 0.6 minutes.  The left arm was prepped with chlorhexidine, draped in the usual sterile fashion using maximum barrier technique (cap and mask, sterile gown, sterile gloves, large sterile sheet, hand hygiene and cutaneous antisepsis) and infiltrated locally with 1% Lidocaine.  Ultrasound demonstrated patency of the left brachial vein, and this was documented with an image.  Under real-time ultrasound guidance, this vein was accessed with a 21 gauge micropuncture needle and image documentation was performed.  The needle was exchanged over a guidewire for a peel-away sheath through which a 5 [place] double lumen PICC trimmed to 53 cm was advanced, positioned with its tip at the lower SVC/right atrial junction.  Fluoroscopy during the procedure and fluoro spot radiograph confirms appropriate catheter position.  The catheter was flushed, secured to the skin with Prolene sutures, and covered with a sterile dressing.  Complications:  No immediate  IMPRESSION: Successful left arm PICC line placement with ultrasound and fluoroscopic guidance.  The catheter is ready for use.  Original Report Authenticated By: Judie Petit. Ruel Favors, M.D.   Ir US Guide Vasc Access Left  01/26/2011  *RADIOLOGY REPORT*  Clinical Data: Poor peripheral veins, no current access for IV therapy  PICC LINE PLACEMENT WITH ULTRASOUND AND FLUOROSCOPIC  GUIDANCE  Fluoroscopy Time: 0.6 minutes.  The left arm was prepped with chlorhexidine, draped in the usual sterile fashion using maximum barrier technique (cap and mask,  sterile gown, sterile gloves, large sterile sheet, hand hygiene and cutaneous antisepsis) and infiltrated locally with 1% Lidocaine.  Ultrasound demonstrated patency of the left brachial vein, and this was documented with an image.  Under real-time ultrasound guidance, this vein was accessed with a 21 gauge micropuncture needle and image documentation was performed.  The needle was exchanged over a guidewire for a peel-away sheath through which a 5 [place] double lumen PICC trimmed to 53 cm was advanced, positioned with its tip at the lower SVC/right atrial junction.  Fluoroscopy during the procedure and fluoro spot radiograph confirms appropriate catheter position.  The catheter was flushed, secured to the skin with Prolene sutures, and covered with a sterile dressing.  Complications:  No immediate  IMPRESSION: Successful left arm PICC line placement with ultrasound and fluoroscopic guidance.  The catheter is ready for use.  Original Report Authenticated By: Judie Petit. Ruel Favors, M.D.   Dg Swallowing Func-no Report Transport: [id number]  01/26/2011  FLUOROSCOPY FOR SWALLOWING FUNCTION STUDY  Fluoroscopy was provided for swallowing function study, which was  a dministered by a Doctor, general practice.  Final results and recommendat ions from this study are contained within the speech pathology repo rt.  Original Report Authenticated By: [id number]    Medications: I have reviewed the patient's current medications. Scheduled Meds:   . antiseptic oral rinse  15 mL Mouth Rinse BID  . atropine  2 drop Sublingual Q6H  . chlorhexidine  15 mL Mouth/Throat BID  . citalopram  10 mg Per Tube Daily  . furosemide  20 mg Per Tube Daily  . gabapentin  300 mg Per Tube QHS  . gentamicin  80 mg Intravenous Q24H  . insulin aspart  0-15 Units Subcutaneous Q4H  . levothyroxine  300 mcg Oral QAC breakfast  . metoprolol tartrate  25 mg Per Tube BID  . mirtazapine  15 mg Per Tube QHS  . multivitamins ther. w/minerals  1 tablet Oral Daily   . pantoprazole (PROTONIX) IV  40 mg Intravenous Q12H  . pravastatin  40 mg Per Tube QHS  . protein supplement  30 mL Per Tube Daily  . vancomycin  750 mg Intravenous Q24H  . vitamin B-1  100 mg Oral Daily  . DISCONTD: dextrose 5 % and 0.45% NaCl  100 mL Intravenous Once   Continuous Infusions:   . dextrose 5 % and 0.45% NaCl 100 mL/hr at 02/18/11 0545  . DISCONTD: feeding supplement (GLUCERNA 1.2 CAL)     PRN Meds:.acetaminophen (TYLENOL) oral liquid 160 mg/5 mL, acetaminophen, albuterol, morphine injection, ondansetron Assessment/Plan: 1. Gastrointestinal bleed. The patient's gastrointestinal bleed is  likely coming from the viable tissue in the colostomy. So far, his  hemoglobin has remained stable, and he has had no further overt  bleeding. The patient's tube feelings have been advanced to a goal  of 65 mL/hr, and we will watch for bleeding and check the  hemoglobin in the morning.  2. Recent mitral valve endocarditis. The patient is presently on  vancomycin and gentamicin titrated per pharmacy, and he is to be on  that until March 10, 2011.  3. Dysphagia. The patient is receiving his feedings and his oral  medications via his PEG tube. He is tolerating them well so far,  and we will continue to a goal of 65 mL/hr on his tube feedings.  4. Hypothyroidism. The patient shows no signs and symptoms of  disruption of thyroid function; however, we will check a TSH on the  patient for baseline.  5. Hypertension, blood pressure well controlled on current regimen.  6. Diabetes type 2, blood sugar is well controlled on current regimen.  7. The patient has had a progressive drop in his hemoglobin  to 7.9g/dl  I will go ahead and transfuse one unit of packed blood cells today.  PLAN: The plan for this patient is to have interventional radiology evaluate the tube to unclog enteroviruses needed. Once the tube dysfunction again we'll restart his tube feedings to goal. In the meantime I'll  go ahead and transfuse the patient one unit of packed red blood cells due to his progressive anemia which is likely secondary to his acute blood loss. We'll monitor the patient's blood pressure and give on Lopressor by IV his blood sugars be monitored every 4 hour basis.   LOS: 5 days

## 2011-02-18 NOTE — Plan of Care (Signed)
Problem: Problem: Sedation Progression Goal: OTHER SEDATION GOAL(S) Outcome: Progressing Reports sleepy like since endo yesterday, arouses and blinks eyes this am but goes right back to sleep

## 2011-02-18 NOTE — Progress Notes (Signed)
Subjective: Patient seen for first time today. This had some rectal bleeding but has not had any overnight. Denies any abdominal pain or any signs of visible blood.  Objective: Vital signs in last 24 hours: Temp:  [98 F (36.7 C)-98.3 F (36.8 C)] 98 F (36.7 C) (11/04 0609) Pulse Rate:  [72-76] 72  (11/04 0609) Resp:  [18-19] 18  (11/04 0609) BP: (131-136)/(77-80) 136/77 mmHg (11/04 0609) SpO2:  [100 %] 100 % (11/04 0609) Weight:  [98.9 kg (218 lb 0.6 oz)] 218 lb 0.6 oz (98.9 kg) (11/04 0609)    Intake/Output from previous day: 11/03 0701 - 11/04 0700 In: 800 [IV Piggyback:800] Out: 450 [Stool:450] Intake/Output this shift:    General appearance: alert, cooperative and no distress GI: soft, non-tender; bowel sounds normal; no masses,  no organomegaly and Completely nondistended and soft with good bowel sounds  Lab Results:  Basename 02/18/11 0600 02/17/11 0540 02/16/11 1820 02/16/11 0319  WBC 3.9* 5.1 -- 5.7  HGB 7.9* 8.5* 8.2* --  HCT 25.1* 27.3* 25.8* --  PLT 176 225 -- 189   BMET  Basename 02/18/11 0600 02/17/11 0540 02/16/11 0319  NA 138 139 138  K 3.1* 3.7 3.2*  CL 108 108 106  CO2 23 22 23   GLUCOSE 111* 147* 140*  BUN 14 12 17   CREATININE 1.37* 1.24 1.26  CALCIUM 9.1 9.1 9.0   LFT No results found for this basename: PROT,ALBUMIN,AST,ALT,ALKPHOS,BILITOT,BILIDIR,IBILI in the last 72 hours PT/INR No results found for this basename: LABPROT:2,INR:2 in the last 72 hours Hepatitis Panel No results found for this basename: HEPBSAG,HCVAB,HEPAIGM,HEPBIGM in the last 72 hours C-Diff No results found for this basename: CDIFFTOX:3 in the last 72 hours Fecal Lactopherrin No results found for this basename: FECLLACTOFRN in the last 72 hours  Studies/Results: No results found.  Medications: I have reviewed the patient's current medications.  Assessment/Plan: 2 patient doing well with no obvious signs of bleeding Patient doing well with no obvious bleeding with a  benign abdominal exam. We'll continue to observe her.  LOS: 5 days   Adreonna Yontz JR,Monesha Monreal L 02/18/2011, 8:52 AM

## 2011-02-18 NOTE — Progress Notes (Signed)
Patient peg tube was occluded at 2200 med pass.  Doctor called and order given to continue feeding.   Peg tube became more occluded, leaking feeding, doctor called and order given to stop feeding.  Order put in for interventional radiology to evaluate peg tube occlusion.

## 2011-02-18 NOTE — Plan of Care (Signed)
Problem: Problem: Diet/Nutrition Progression Goal: OTHER DIET/NUTRITION GOAL(S) Outcome: Progressing History of CVA with dysphasia, Peg tube for feedings left abd area

## 2011-02-18 NOTE — Plan of Care (Signed)
Problem: Problem: Respiratory Progression Goal: OTHER RESPIRATORY GOAL(S) Outcome: Progressing Occasional productive cough, is a mouth breather but is able to control his secretions. Staff using yonker at times when in room.

## 2011-02-18 NOTE — Plan of Care (Signed)
Problem: Problem: Neuro Progression Goal: OTHER NEURO GOAL(S) Outcome: Progressing History of CVA, sleepy like since endo yesterday but norma is alert and oriented but unable to perform ADL's

## 2011-02-19 DIAGNOSIS — R410 Disorientation, unspecified: Secondary | ICD-10-CM

## 2011-02-19 DIAGNOSIS — E1351 Other specified diabetes mellitus with diabetic peripheral angiopathy without gangrene: Secondary | ICD-10-CM

## 2011-02-19 DIAGNOSIS — I6930 Unspecified sequelae of cerebral infarction: Secondary | ICD-10-CM

## 2011-02-19 DIAGNOSIS — E039 Hypothyroidism, unspecified: Secondary | ICD-10-CM

## 2011-02-19 DIAGNOSIS — I69391 Dysphagia following cerebral infarction: Secondary | ICD-10-CM

## 2011-02-19 DIAGNOSIS — I10 Essential (primary) hypertension: Secondary | ICD-10-CM

## 2011-02-19 DIAGNOSIS — K922 Gastrointestinal hemorrhage, unspecified: Secondary | ICD-10-CM

## 2011-02-19 LAB — BASIC METABOLIC PANEL
CO2: 22 mEq/L (ref 19–32)
Calcium: 9.5 mg/dL (ref 8.4–10.5)
Creatinine, Ser: 1.34 mg/dL (ref 0.50–1.35)
GFR calc Af Amer: 59 mL/min — ABNORMAL LOW (ref >90)
GFR calc non Af Amer: 51 mL/min — ABNORMAL LOW (ref >90)
Sodium: 138 mEq/L (ref 135–145)

## 2011-02-19 LAB — GLUCOSE, CAPILLARY: Glucose-Capillary: 132 mg/dL — ABNORMAL HIGH (ref 70–99)

## 2011-02-19 LAB — HEMOGLOBIN AND HEMATOCRIT, BLOOD
HCT: 28.8 % — ABNORMAL LOW (ref 39.0–52.0)
Hemoglobin: 9.3 g/dL — ABNORMAL LOW (ref 13.0–17.0)

## 2011-02-19 LAB — TYPE AND SCREEN: Unit division: 0

## 2011-02-19 LAB — CBC
MCH: 27.3 pg (ref 26.0–34.0)
MCHC: 32.2 g/dL (ref 30.0–36.0)
Platelets: 188 10*3/uL (ref 150–400)
RBC: 3.48 MIL/uL — ABNORMAL LOW (ref 4.22–5.81)
RDW: 18.1 % — ABNORMAL HIGH (ref 11.5–15.5)

## 2011-02-19 MED ORDER — POTASSIUM CHLORIDE 20 MEQ PO PACK
40.0000 meq | PACK | Freq: Two times a day (BID) | ORAL | Status: DC
  Administered 2011-02-19: 40 meq
  Filled 2011-02-19 (×3): qty 2

## 2011-02-19 MED ORDER — POTASSIUM CHLORIDE 20 MEQ/15ML (10%) PO LIQD
40.0000 meq | Freq: Once | ORAL | Status: AC
  Administered 2011-02-19: 40 meq via ORAL
  Filled 2011-02-19: qty 30

## 2011-02-19 MED ORDER — LEVOTHYROXINE SODIUM 100 MCG PO TABS
100.0000 ug | ORAL_TABLET | Freq: Every day | ORAL | Status: DC
  Administered 2011-02-20: 100 ug via ORAL
  Filled 2011-02-19 (×3): qty 1

## 2011-02-19 MED ORDER — GLUCERNA 1.2 CAL PO LIQD
1000.0000 mL | ORAL | Status: DC
  Administered 2011-02-19 – 2011-02-20 (×2): 1000 mL via ORAL
  Filled 2011-02-19 (×3): qty 1000

## 2011-02-19 MED ORDER — LEVOTHYROXINE SODIUM 200 MCG PO TABS
200.0000 ug | ORAL_TABLET | Freq: Every day | ORAL | Status: DC
  Administered 2011-02-20: 200 ug via ORAL
  Filled 2011-02-19 (×4): qty 1

## 2011-02-19 NOTE — Progress Notes (Signed)
Attempted to flush G tube once more. No success. Notified [place], [place] of continuing difficulty. Will continue to monitor. Iyania Denne Driggers RN 02/19/2011 1026

## 2011-02-19 NOTE — Progress Notes (Signed)
[facility] Progress Note  Subjective: The patient has no new complaints. He is not aware of any rectal bleeding. Objective: Vital signs in last 24 hours: Temp:  [97.2 F (36.2 C)-98.2 F (36.8 C)] 97.3 F (36.3 C) (11/05 0520) Pulse Rate:  [68-100] 73  (11/05 0520) Resp:  [18] 18  (11/05 0520) BP: (118-158)/(72-90) 158/90 mmHg (11/05 0520) SpO2:  [96 %-100 %] 99 % (11/05 0520) Weight:  [101.5 kg (223 lb 12.3 oz)] 223 lb 12.3 oz (101.5 kg) (11/05 0520) Weight change: 2.6 kg (5 lb 11.7 oz)   PE the ostomy bag appears relatively empty. Abdomen is soft.  Lab Results: Results for orders placed during the hospital encounter of 02/13/11 (from the past 24 hour(s))  GLUCOSE, CAPILLARY     Status: Abnormal   Collection Time   02/18/11  8:51 AM      Component Value Range   Glucose-Capillary 116 (*) 70 - 99 (mg/dL)  GLUCOSE, CAPILLARY     Status: Abnormal   Collection Time   02/18/11  2:09 PM      Component Value Range   Glucose-Capillary 143 (*) 70 - 99 (mg/dL)  GLUCOSE, CAPILLARY     Status: Abnormal   Collection Time   02/18/11  4:57 PM      Component Value Range   Glucose-Capillary 114 (*) 70 - 99 (mg/dL)  HEMOGLOBIN AND HEMATOCRIT, BLOOD     Status: Abnormal   Collection Time   02/18/11  6:45 PM      Component Value Range   Hemoglobin 8.3 (*) 13.0 - 17.0 (g/dL)   HCT 91.4 (*) 78.2 - 52.0 (%)  PREPARE RBC (CROSSMATCH)     Status: Normal   Collection Time   02/18/11  8:00 PM      Component Value Range   Order Confirmation ORDER PROCESSED BY BLOOD BANK    TYPE AND SCREEN     Status: Normal (Preliminary result)   Collection Time   02/18/11  8:00 PM      Component Value Range   ABO/RH(D) B POS     Antibody Screen NEG     Sample Expiration 02/21/2011     Unit Number [id number]     Blood Component Type RED CELLS,LR     Unit division 00     Status of Unit ISSUED     Transfusion Status OK TO TRANSFUSE     Crossmatch Result Compatible    GLUCOSE, CAPILLARY      Status: Abnormal   Collection Time   02/19/11 12:12 AM      Component Value Range   Glucose-Capillary 109 (*) 70 - 99 (mg/dL)   Comment 1 Notify RN    CBC     Status: Abnormal   Collection Time   02/19/11  3:35 AM      Component Value Range   WBC 5.4  4.0 - 10.5 (K/uL)   RBC 3.48 (*) 4.22 - 5.81 (MIL/uL)   Hemoglobin 9.5 (*) 13.0 - 17.0 (g/dL)   HCT 57.8 (*) 46.9 - 52.0 (%)   MCV 84.8  78.0 - 100.0 (fL)   MCH 27.3  26.0 - 34.0 (pg)   MCHC 32.2  30.0 - 36.0 (g/dL)   RDW 62.9 (*) 52.8 - 15.5 (%)   Platelets 188  150 - 400 (K/uL)  BASIC METABOLIC PANEL     Status: Abnormal   Collection Time   02/19/11  3:35 AM      Component Value Range  Sodium 138  135 - 145 (mEq/L)   Potassium 2.8 (*) 3.5 - 5.1 (mEq/L)   Chloride 106  96 - 112 (mEq/L)   CO2 22  19 - 32 (mEq/L)   Glucose, Bld 126 (*) 70 - 99 (mg/dL)   BUN 12  6 - 23 (mg/dL)   Creatinine, Ser 0.98  0.50 - 1.35 (mg/dL)   Calcium 9.5  8.4 - 11.9 (mg/dL)   GFR calc non Af Amer 51 (*) >90 (mL/min)   GFR calc Af Amer 59 (*) >90 (mL/min)  GLUCOSE, CAPILLARY     Status: Abnormal   Collection Time   02/19/11  4:14 AM      Component Value Range   Glucose-Capillary 124 (*) 70 - 99 (mg/dL)   Comment 1 Notify RN      Studies/Results: @[id number] @    Assessment: Prior rectal bleeding, possibly representing diversion colitis, appears to be resolved with stable hemoglobin. Plan: Expectant management alone. Will sign off for now.  Maxi Carreras C 02/19/2011, 7:52 AM

## 2011-02-19 NOTE — Progress Notes (Signed)
ANTIBIOTIC CONSULT NOTE - FOLLOW UP  Pharmacy Consult for Vancomycin,Gentamicin  Indication: endocarditis  No Known Allergies  Patient Measurements: Height: 6\' 3"  (190.5 cm) (Entered for Cutover) Weight: 223 lb 12.3 oz (101.5 kg) IBW/kg (Calculated) : 84.5  Adjusted Body Weight: 91 (AG)  Vital Signs: Temp: 97.3 F (36.3 C) (11/05 0520) Temp src: Oral (11/05 0520) BP: 158/90 mmHg (11/05 0520) Pulse Rate: 73  (11/05 0520) Intake/Output from previous day: 11/04 0701 - 11/05 0700 In: 1775 [I.V.:1175; Blood:300; IV Piggyback:300] Out: 2375 [Urine:2375] Intake/Output from this shift: Total I/O In: -  Out: 350 [Urine:350]  Labs:  [facility] 02/19/11 0335 02/18/11 1845 02/18/11 0600 02/17/11 0540  WBC 5.4 -- 3.9* 5.1  HGB 9.5* 8.3* 7.9* --  PLT 188 -- 176 225  LABCREA -- -- -- --  CREATININE 1.34 -- 1.37* 1.24   Estimated Creatinine Clearance: 63.4 ml/min (by C-G formula based on Cr of 1.34). No results found for this basename: VANCOTROUGH:2,VANCOPEAK:2,VANCORANDOM:2,GENTTROUGH:2,GENTPEAK:2,GENTRANDOM:2,TOBRATROUGH:2,TOBRAPEAK:2,TOBRARND:2,AMIKACINPEAK:2,AMIKACINTROU:2,AMIKACIN:2, in the last 72 hours   Microbiology: Recent Results (from the past 720 hour(s))  URINE CULTURE     Status: Normal   Collection Time   01/20/11  3:15 PM      Component Value Range Status Comment   Specimen Description URINE, RANDOM   Final    Special Requests NONE   Final    Setup Time     Final    Value: [id number] DEMOGRAPHIC UPDATE OCCURRED ON 10/21 AT 1127, QA FLAGS AND RANGES MAY NO LONGER BE VALID DEMOGRAPHIC UPDATE OCCURRED ON 10/21 AT 1130, QA FLAGS AND RANGES MAY NO LONGER BE VALID   Colony Count     Final    Value: 50,000 COLONIES/ML DEMOGRAPHIC UPDATE OCCURRED ON 10/21 AT 1127, QA FLAGS AND RANGES MAY NO LONGER BE VALID DEMOGRAPHIC UPDATE OCCURRED ON 10/21 AT 1130, QA FLAGS AND RANGES MAY NO LONGER BE VALID   Culture     Final    Value: ENTEROCOCCUS SPECIES DEMOGRAPHIC UPDATE OCCURRED  ON 10/21 AT 1127, QA FLAGS AND RANGES MAY NO LONGER BE VALID DEMOGRAPHIC UPDATE OCCURRED ON 10/21 AT 1130, QA FLAGS AND RANGES MAY NO LONGER BE VALID   Report Status     Final    Value: 01/24/2011 FINAL DEMOGRAPHIC UPDATE OCCURRED ON 10/21 AT 1127, QA FLAGS AND RANGES MAY NO LONGER BE VALID DEMOGRAPHIC UPDATE OCCURRED ON 10/21 AT 1130, QA FLAGS AND RANGES MAY NO LONGER BE VALID   Organism ID, Bacteria ENTEROCOCCUS SPECIES   Final   CULTURE, BLOOD (ROUTINE X 2)     Status: Normal   Collection Time   01/21/11  4:27 PM      Component Value Range Status Comment   Specimen Description BLOOD RIGHT HAND   Final    Special Requests     Final    Value: BOTTLES DRAWN AEROBIC AND ANAEROBIC 2CC BOTH BOTTLES   Setup Time     Final    Value: [id number] DEMOGRAPHIC UPDATE OCCURRED ON 10/21 AT 1127, QA FLAGS AND RANGES MAY NO LONGER BE VALID DEMOGRAPHIC UPDATE OCCURRED ON 10/21 AT 1130, QA FLAGS AND RANGES MAY NO LONGER BE VALID   Culture     Final    Value: ENTEROCOCCUS FAECALIS     Note: SUSCEPTIBILITIES PERFORMED ON PREVIOUS CULTURE WITHIN THE LAST 5 DAYS.     Note: Gram Stain Report Called to,Read Back By and Verified With: STACY GILBERT 01/23/11 @ 1935 HAJAM DEMOGRAPHIC UPDATE OCCURRED ON 10/21 AT 1127, QA FLAGS AND RANGES MAY NO  LONGER BE VALID DEMOGRAPHIC UPDATE OCCURRED ON 10/21 AT 1130, QA FLAGS AND      RANGES MAY NO LONGER BE VALID   Report Status     Final    Value: 01/26/2011 FINAL DEMOGRAPHIC UPDATE OCCURRED ON 10/21 AT 1127, QA FLAGS AND RANGES MAY NO LONGER BE VALID DEMOGRAPHIC UPDATE OCCURRED ON 10/21 AT 1130, QA FLAGS AND RANGES MAY NO LONGER BE VALID  CULTURE, BLOOD (ROUTINE X 2)     Status: Normal   Collection Time   01/21/11  4:32 PM      Component Value Range Status Comment   Specimen Description BLOOD LEFT ARM   Final    Special Requests     Final    Value: BOTTLES DRAWN AEROBIC AND ANAEROBIC  2CC BOTH BOTTLES   Setup Time     Final    Value: [id number] DEMOGRAPHIC UPDATE  OCCURRED ON 10/21 AT 1127, QA FLAGS AND RANGES MAY NO LONGER BE VALID DEMOGRAPHIC UPDATE OCCURRED ON 10/21 AT 1130, QA FLAGS AND RANGES MAY NO LONGER BE VALID   Culture     Final    Value: ENTEROCOCCUS FAECALIS     Note: Gram Stain Report Called to,Read Back By and Verified With: KIRSTIN KNIGHT @ 1245 01/23/11 WICKN DEMOGRAPHIC UPDATE OCCURRED ON 10/21 AT 1127, QA FLAGS AND RANGES MAY NO LONGER BE VALID DEMOGRAPHIC UPDATE OCCURRED ON 10/21 AT 1130, QA FLAGS AND      RANGES MAY NO LONGER BE VALID   Report Status     Final    Value: 01/26/2011 FINAL DEMOGRAPHIC UPDATE OCCURRED ON 10/21 AT 1127, QA FLAGS AND RANGES MAY NO LONGER BE VALID DEMOGRAPHIC UPDATE OCCURRED ON 10/21 AT 1130, QA FLAGS AND RANGES MAY NO LONGER BE VALID   Organism ID, Bacteria ENTEROCOCCUS FAECALIS   Final   CULTURE, BLOOD (ROUTINE X 2)     Status: Normal   Collection Time   01/23/11  4:30 PM      Component Value Range Status Comment   Specimen Description BLOOD LEFT ARM   Final    Special Requests BOTTLES DRAWN AEROBIC ONLY 4CC   Final    Setup Time     Final    Value: [id number] DEMOGRAPHIC UPDATE OCCURRED ON 10/21 AT 1127, QA FLAGS AND RANGES MAY NO LONGER BE VALID DEMOGRAPHIC UPDATE OCCURRED ON 10/21 AT 1130, QA FLAGS AND RANGES MAY NO LONGER BE VALID   Culture     Final    Value: NO GROWTH 5 DAYS DEMOGRAPHIC UPDATE OCCURRED ON 10/21 AT 1127, QA FLAGS AND RANGES MAY NO LONGER BE VALID DEMOGRAPHIC UPDATE OCCURRED ON 10/21 AT 1130, QA FLAGS AND RANGES MAY NO LONGER BE VALID   Report Status     Final    Value: 01/29/2011 FINAL DEMOGRAPHIC UPDATE OCCURRED ON 10/21 AT 1127, QA FLAGS AND RANGES MAY NO LONGER BE VALID DEMOGRAPHIC UPDATE OCCURRED ON 10/21 AT 1130, QA FLAGS AND RANGES MAY NO LONGER BE VALID  CULTURE, BLOOD (ROUTINE X 2)     Status: Normal   Collection Time   01/24/11  6:30 PM      Component Value Range Status Comment   Specimen Description BLOOD LEFT ARM   Final    Special Requests BOTTLES DRAWN AEROBIC AND  ANAEROBIC 6CC   Final    Setup Time     Final    Value: [id number] DEMOGRAPHIC UPDATE OCCURRED ON 10/21 AT 1127, QA FLAGS AND RANGES MAY NO LONGER BE VALID DEMOGRAPHIC UPDATE  OCCURRED ON 10/21 AT 1130, QA FLAGS AND RANGES MAY NO LONGER BE VALID   Culture     Final    Value: ENTEROCOCCUS SPECIES     Note: SUSCEPTIBILITIES PERFORMED ON PREVIOUS CULTURE WITHIN THE LAST 5 DAYS.     Note: Gram Stain Report Called to,Read Back By and Verified With: RN K. TELEDO ON 01/28/11 AT 2055 BY TEDAR DEMOGRAPHIC UPDATE OCCURRED ON 10/21 AT 1127, QA FLAGS AND RANGES MAY NO LONGER BE VALID DEMOGRAPHIC UPDATE OCCURRED ON 10/21 AT 1130, QA FLAGS      AND RANGES MAY NO LONGER BE VALID   Report Status     Final    Value: 01/31/2011 FINAL DEMOGRAPHIC UPDATE OCCURRED ON 10/21 AT 1127, QA FLAGS AND RANGES MAY NO LONGER BE VALID DEMOGRAPHIC UPDATE OCCURRED ON 10/21 AT 1130, QA FLAGS AND RANGES MAY NO LONGER BE VALID  CULTURE, BLOOD (ROUTINE X 2)     Status: Normal   Collection Time   01/24/11  6:45 PM      Component Value Range Status Comment   Specimen Description BLOOD RIGHT HAND   Final    Special Requests BOTTLES DRAWN AEROBIC ONLY 6CC   Final    Setup Time     Final    Value: [id number] DEMOGRAPHIC UPDATE OCCURRED ON 10/21 AT 1127, QA FLAGS AND RANGES MAY NO LONGER BE VALID DEMOGRAPHIC UPDATE OCCURRED ON 10/21 AT 1130, QA FLAGS AND RANGES MAY NO LONGER BE VALID   Culture     Final    Value: ENTEROCOCCUS SPECIES     Note: Gram Stain Report Called to,Read Back By and Verified With: RN M. SEAGRAVES ON 01/28/11 AT 1540 BY TEDAR DEMOGRAPHIC UPDATE OCCURRED ON 10/21 AT 1127, QA FLAGS AND RANGES MAY NO LONGER BE VALID DEMOGRAPHIC UPDATE OCCURRED ON 10/21 AT 1130, QA FLAGS      AND RANGES MAY NO LONGER BE VALID   Report Status     Final    Value: 01/31/2011 FINAL DEMOGRAPHIC UPDATE OCCURRED ON 10/21 AT 1127, QA FLAGS AND RANGES MAY NO LONGER BE VALID DEMOGRAPHIC UPDATE OCCURRED ON 10/21 AT 1130, QA FLAGS AND RANGES MAY  NO LONGER BE VALID   Organism ID, Bacteria ENTEROCOCCUS SPECIES   Final   CULTURE, BLOOD (ROUTINE X 2)     Status: Normal   Collection Time   01/28/11  6:00 PM      Component Value Range Status Comment   Specimen Description BLOOD LEFT HAND   Final    Special Requests BOTTLES DRAWN AEROBIC ONLY 2CC   Final    Setup Time     Final    Value: [id number] DEMOGRAPHIC UPDATE OCCURRED ON 10/21 AT 1127, QA FLAGS AND RANGES MAY NO LONGER BE VALID DEMOGRAPHIC UPDATE OCCURRED ON 10/21 AT 1130, QA FLAGS AND RANGES MAY NO LONGER BE VALID   Culture     Final    Value: NO GROWTH 5 DAYS DEMOGRAPHIC UPDATE OCCURRED ON 10/21 AT 1127, QA FLAGS AND RANGES MAY NO LONGER BE VALID DEMOGRAPHIC UPDATE OCCURRED ON 10/21 AT 1130, QA FLAGS AND RANGES MAY NO LONGER BE VALID   Report Status     Final    Value: 02/04/2011 FINAL DEMOGRAPHIC UPDATE OCCURRED ON 10/21 AT 1127, QA FLAGS AND RANGES MAY NO LONGER BE VALID DEMOGRAPHIC UPDATE OCCURRED ON 10/21 AT 1130, QA FLAGS AND RANGES MAY NO LONGER BE VALID  CULTURE, BLOOD (ROUTINE X 2)     Status: Normal   Collection Time  01/28/11 10:15 PM      Component Value Range Status Comment   Specimen Description BLOOD L PICC   Final    Special Requests     Final    Value: IMMUNE:NORM VAR BOTTLES DRAWN AEROBIC AND ANAEROBIC   Setup Time     Final    Value: [id number] DEMOGRAPHIC UPDATE OCCURRED ON 10/21 AT 1127, QA FLAGS AND RANGES MAY NO LONGER BE VALID DEMOGRAPHIC UPDATE OCCURRED ON 10/21 AT 1130, QA FLAGS AND RANGES MAY NO LONGER BE VALID   Culture     Final    Value: NO GROWTH 5 DAYS DEMOGRAPHIC UPDATE OCCURRED ON 10/21 AT 1127, QA FLAGS AND RANGES MAY NO LONGER BE VALID DEMOGRAPHIC UPDATE OCCURRED ON 10/21 AT 1130, QA FLAGS AND RANGES MAY NO LONGER BE VALID   Report Status     Final    Value: 02/04/2011 FINAL DEMOGRAPHIC UPDATE OCCURRED ON 10/21 AT 1127, QA FLAGS AND RANGES MAY NO LONGER BE VALID DEMOGRAPHIC UPDATE OCCURRED ON 10/21 AT 1130, QA FLAGS AND RANGES MAY NO  LONGER BE VALID  MRSA PCR SCREENING     Status: Normal   Collection Time   02/13/11 11:52 PM      Component Value Range Status Comment   MRSA by PCR NEGATIVE  NEGATIVE  Final     Anti-infectives    None      Assessment: 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 Last Vanc trough was appropriate for goal 15-45mcg/ml, Gent trough was slightly elevated for goal of <1 mcg/ml. Vanc and gent doses adjusted 11/2 for rising Scr, and elevated gent trough. Pt is afebrile, WBC WNL, Scr stable. UOP reported as 8ml/kg/hr for last 24h.   Goal of Therapy:  Vancomycin trough level 15-20 mcg/ml Gentamicin trough level <2 mcg/ml  Plan:  1. Continue current Gentamicin dose, will check Gent trough at 1330 tomorrow, timed collection, 30 min prior to next dose due. 2. Continue current Vancomycin dose, will check Vancomycin trough at 0730 tomorrow, times collection, 30 min prior to next dose due. 3. Will monitor Scr/UOP daily.  Mirna Mires Krishnavadan 02/19/2011,10:49 AM

## 2011-02-19 NOTE — Progress Notes (Signed)
Addendum to previous note. Added to Problem List:  1.  Hypothyroidism:  The patient's TSH is found to be elevated.  This could be sick euthyroid.  I will add Free t4 and t3 to am labs.    2.  DM 2:  Well controlled on current medications. 3.  HTN:  Well controlled on current medications.

## 2011-02-19 NOTE — Progress Notes (Addendum)
Subjective:  [name] has no complaints.    Objective: Weight change: 2.6 kg (5 lb 11.7 oz)  Intake/Output Summary (Last 24 hours) at 02/19/11 1358 Last data filed at 02/19/11 1218  Gross per 24 hour  Intake   1925 ml  Output   2925 ml  Net  -1000 ml   Blood pressure 158/90, pulse 73, temperature 97.3 F (36.3 C), temperature source Oral, resp. rate 18, height 6\' 3"  (1.905 m), weight 101.5 kg (223 lb 12.3 oz), SpO2 99.00%.   Physical Exam: General: Alert and awake oriented x3 not in any acute distress. HEENT: anicteric sclera, pupils reactive to light and accommodation CVS: S1-S2 clear no murmur rubs or gallops, Heart sounds slightly difficult to hear. Chest: clear to auscultation bilaterally, no wheezing rales or rhonchi Abdomen: soft nontender, nondistended, decreased bowel sounds, no organomegaly, ostomy in place, no frank blood or melena in ostomy bag. Extremities: no cyanosis, clubbing or edema noted bilaterally.  Nail fungus in lower extremity nails. Psych:  A&O x 3, Pleasant, cooperative.  Lab Results:  Basename 02/19/11 0335 02/18/11 1845 02/18/11 0600  WBC 5.4 -- 3.9*  HGB 9.5* 8.3* --  HCT 29.5* 26.1* --  PLT 188 -- 176   BMET  Basename 02/19/11 0335 02/18/11 0600  NA 138 138  K 2.8* 3.1*  CL 106 108  CO2 22 23  GLUCOSE 126* 111*  BUN 12 14  CREATININE 1.34 1.37*  CALCIUM 9.5 9.1    Micro Results: Recent Results (from the past 240 hour(s))  MRSA PCR SCREENING     Status: Normal   Collection Time   02/13/11 11:52 PM      Component Value Range Status Comment   MRSA by PCR NEGATIVE  NEGATIVE  Final     Studies/Results: Nm Gi Blood Loss  02/14/2011  *RADIOLOGY REPORT*  Clinical Data: GI bleed.  Left abdominal colostomy without further clinical history.  Bleeding through colostomy.  NUCLEAR MEDICINE GASTROINTESTINAL BLEEDING STUDY  Technique:  Sequential abdominal images were obtained following intravenous administration of Tc-55m labeled red blood  cells.  Radiopharmaceutical: 30 mCi technetium 55m labeled red blood cells.  Comparison: None.  Findings: Imaging was obtained for 2 hours following injection of isotope.  There is good red blood cell tagging.  There is activity in the aorta and  iliacs.  There is progressive activity within the urinary bladder.  Filling defect in the bladder is felt to be a Foley catheter balloon.  No evidence of GI bleeding at this time.  IMPRESSION: Negative for GI bleeding 2 hours.  Further imaging can be performed if there is additional bleeding.  Original Report Authenticated By: Camelia Phenes, M.D.   Ir Fluoro Guide Cv Line Left  01/26/2011  *RADIOLOGY REPORT*  Clinical Data: Poor peripheral veins, no current access for IV therapy  PICC LINE PLACEMENT WITH ULTRASOUND AND FLUOROSCOPIC  GUIDANCE  Fluoroscopy Time: 0.6 minutes.  The left arm was prepped with chlorhexidine, draped in the usual sterile fashion using maximum barrier technique (cap and mask, sterile gown, sterile gloves, large sterile sheet, hand hygiene and cutaneous antisepsis) and infiltrated locally with 1% Lidocaine.  Ultrasound demonstrated patency of the left brachial vein, and this was documented with an image.  Under real-time ultrasound guidance, this vein was accessed with a 21 gauge micropuncture needle and image documentation was performed.  The needle was exchanged over a guidewire for a peel-away sheath through which a 5 [place] double lumen PICC trimmed to 53 cm was advanced,  positioned with its tip at the lower SVC/right atrial junction.  Fluoroscopy during the procedure and fluoro spot radiograph confirms appropriate catheter position.  The catheter was flushed, secured to the skin with Prolene sutures, and covered with a sterile dressing.  Complications:  No immediate  IMPRESSION: Successful left arm PICC line placement with ultrasound and fluoroscopic guidance.  The catheter is ready for use.  Original Report Authenticated By: Judie Petit. Ruel Favors, M.D.   Ir US Guide Vasc Access Left  01/26/2011  *RADIOLOGY REPORT*  Clinical Data: Poor peripheral veins, no current access for IV therapy  PICC LINE PLACEMENT WITH ULTRASOUND AND FLUOROSCOPIC  GUIDANCE  Fluoroscopy Time: 0.6 minutes.  The left arm was prepped with chlorhexidine, draped in the usual sterile fashion using maximum barrier technique (cap and mask, sterile gown, sterile gloves, large sterile sheet, hand hygiene and cutaneous antisepsis) and infiltrated locally with 1% Lidocaine.  Ultrasound demonstrated patency of the left brachial vein, and this was documented with an image.  Under real-time ultrasound guidance, this vein was accessed with a 21 gauge micropuncture needle and image documentation was performed.  The needle was exchanged over a guidewire for a peel-away sheath through which a 5 [place] double lumen PICC trimmed to 53 cm was advanced, positioned with its tip at the lower SVC/right atrial junction.  Fluoroscopy during the procedure and fluoro spot radiograph confirms appropriate catheter position.  The catheter was flushed, secured to the skin with Prolene sutures, and covered with a sterile dressing.  Complications:  No immediate  IMPRESSION: Successful left arm PICC line placement with ultrasound and fluoroscopic guidance.  The catheter is ready for use.  Original Report Authenticated By: Judie Petit. Ruel Favors, M.D.   Dg Swallowing Func-no Report Transport: [id number]  01/26/2011  FLUOROSCOPY FOR SWALLOWING FUNCTION STUDY  Fluoroscopy was provided for swallowing function study, which was a dministered by a speech pathologist.  Final results and recommendat ions from this study are contained within the speech pathology repo rt.  Original Report Authenticated By: [id number]    Medications: Scheduled Meds:   . antiseptic oral rinse  15 mL Mouth Rinse BID  . atropine  2 drop Sublingual Q6H  . chlorhexidine  15 mL Mouth/Throat BID  . citalopram  10 mg Per Tube Daily  . furosemide   20 mg Per Tube Daily  . gabapentin  300 mg Per Tube QHS  . gentamicin  80 mg Intravenous Q24H  . insulin aspart  0-15 Units Subcutaneous Q4H  . levothyroxine  200 mcg Oral QAC breakfast   And  . levothyroxine  100 mcg Oral QAC breakfast  . metoprolol  5 mg Intravenous Q8H  . mirtazapine  15 mg Per Tube QHS  . multivitamins ther. w/minerals  1 tablet Oral Daily  . pantoprazole (PROTONIX) IV  40 mg Intravenous Q12H  . potassium chloride  40 mEq Per Tube BID  . pravastatin  40 mg Per Tube QHS  . protein supplement  30 mL Per Tube Daily  . vancomycin  750 mg Intravenous Q24H  . vitamin B-1  100 mg Oral Daily  . DISCONTD: levothyroxine  300 mcg Oral QAC breakfast  . DISCONTD: metoprolol  5 mg Intravenous Q8H  . DISCONTD: metoprolol tartrate  25 mg Per Tube BID   Continuous Infusions:   . dextrose 5 % and 0.45% NaCl 100 mL/hr (02/19/11 1121)  . DISCONTD: sodium chloride 250 mL (02/18/11 2214)   PRN Meds:.acetaminophen (TYLENOL) oral liquid 160 mg/5 mL, acetaminophen, albuterol, morphine injection,  ondansetron  Assessment/Plan: 1.  GI Bleeding. S/P endoscopic procedures.  Bleeding appears to have subsided. Patient has received one unit of PRBCs on 02/18/11 2.  History of Stroke - Stable 3.  History of Dysphagia secondary to stroke, PEG tube in place.  Receives PEG tube feeds. 4.  Hypokalemia:  Will replete and check serum magnesium, and bmet in am. 5.  Acute Delirium / encephalopathy has resolved and patient is currently at baseline mental status. 6.  Disposition will likely D/C to SNF on 02/20/11  Patient seen and examined and discussed with PA [facility]. We would above.   LOS: 6 days   Mammie Russian 02/19/2011, 1:58 PM

## 2011-02-19 NOTE — Progress Notes (Signed)
Clinical Social Work:  CSW is continuing to follow patient to assist with plans.  Pt is from [facility] and plan is for him to return when medically stable for discharge.Makenli Derstine, Sherrie Sport

## 2011-02-20 DIAGNOSIS — I38 Endocarditis, valve unspecified: Secondary | ICD-10-CM | POA: Diagnosis present

## 2011-02-20 LAB — CBC
HCT: 29.1 % — ABNORMAL LOW (ref 39.0–52.0)
MCH: 27.6 pg (ref 26.0–34.0)
MCV: 85.6 fL (ref 78.0–100.0)
Platelets: 183 10*3/uL (ref 150–400)
RDW: 18.3 % — ABNORMAL HIGH (ref 11.5–15.5)

## 2011-02-20 LAB — HEMOGLOBIN AND HEMATOCRIT, BLOOD: Hemoglobin: 9.9 g/dL — ABNORMAL LOW (ref 13.0–17.0)

## 2011-02-20 LAB — BASIC METABOLIC PANEL
BUN: 14 mg/dL (ref 6–23)
Calcium: 9.3 mg/dL (ref 8.4–10.5)
Creatinine, Ser: 1.38 mg/dL — ABNORMAL HIGH (ref 0.50–1.35)
GFR calc Af Amer: 57 mL/min — ABNORMAL LOW (ref >90)

## 2011-02-20 LAB — GLUCOSE, CAPILLARY
Glucose-Capillary: 113 mg/dL — ABNORMAL HIGH (ref 70–99)
Glucose-Capillary: 130 mg/dL — ABNORMAL HIGH (ref 70–99)
Glucose-Capillary: 75 mg/dL (ref 70–99)

## 2011-02-20 LAB — MAGNESIUM: Magnesium: 1.8 mg/dL (ref 1.5–2.5)

## 2011-02-20 LAB — GENTAMICIN LEVEL, TROUGH: Gentamicin Trough: 1.4 ug/mL (ref 0.5–2.0)

## 2011-02-20 MED ORDER — VANCOMYCIN HCL 1000 MG IV SOLR: 750.0000 mg | INTRAVENOUS | Status: DC

## 2011-02-20 MED ORDER — MORPHINE SULFATE 4 MG/ML IJ SOLN: 1.0000 mg | INTRAMUSCULAR | Status: DC | PRN

## 2011-02-20 MED ORDER — HEPARIN SOD (PORK) LOCK FLUSH 100 UNIT/ML IV SOLN
500.0000 [IU] | Freq: Once | INTRAVENOUS | Status: DC
  Filled 2011-02-20: qty 5

## 2011-02-20 MED ORDER — SODIUM CHLORIDE 0.9 % IJ SOLN
10.0000 mL | Freq: Once | INTRAMUSCULAR | Status: DC
Start: 2011-02-20 — End: 2011-02-21

## 2011-02-20 NOTE — Progress Notes (Signed)
Clinical Social Work, 02/20/11, 1700:  Clinical Social Work:  Patient will be discharged back to [facility].  CSW faxed discharge paperwork to facility and arranged ambulence transportation.[name], [place], [place] #[phone]

## 2011-02-20 NOTE — Progress Notes (Signed)
[name] to be D/C'd Skilled nursing facility per MD order.  Discussed with the patient and family and all questions fully answered.   [name], [name]  Home Medication Instructions [id number]   Printed on:02/20/11 1700  Medication Information                    acetaminophen (TYLENOL) 160 MG/5ML elixir Give 640 mg by tube every 6 (six) hours as needed. For pain            aspirin 325 MG tablet Give 325 mg by tube daily.             atropine 1 % ophthalmic solution Take 2 drops by mouth every 6 (six) hours.             citalopram (CELEXA) 10 MG/5ML suspension Give 10 mg by tube daily.             ferrous sulfate 300 (60 FE) MG/5ML syrup Give 300 mg by tube 2 (two) times daily.             saccharomyces boulardii (FLORASTOR) 250 MG capsule Give 250 mg by tube 2 (two) times daily.             furosemide (LASIX) 20 MG tablet Give 20 mg by tube daily.             gabapentin (NEURONTIN) 300 MG capsule Give 300 mg by tube at bedtime.             gentamicin (GARAMYCIN) 0.8-0.9 MG/ML-% Inject 80 mg into the vein every 12 (twelve) hours.             insulin glargine (LANTUS) 100 UNIT/ML injection Inject 10 Units into the skin at bedtime.             metFORMIN (GLUCOPHAGE) 500 MG tablet Give 500 mg by tube daily.             metoprolol tartrate (LOPRESSOR) 25 MG tablet Give 25 mg by tube 2 (two) times daily.             mirtazapine (REMERON) 15 MG tablet Give 15 mg by tube at bedtime.             Multiple Vitamins-Minerals (MULTIVITAMINS THER. W/MINERALS) TABS Give 1 tablet by tube daily.             potassium chloride 20 MEQ/15ML (10%) solution Give 10 mEq by tube daily.             pravastatin (PRAVACHOL) 40 MG tablet Give 40 mg by tube at bedtime.             ranitidine (ZANTAC) 75 MG tablet Give 150 mg by tube 2 (two) times daily.             sennosides-docusate sodium (SENOKOT-S) 8.6-50 MG tablet Give 2 tablets by tube 2 (two) times daily.       levothyroxine (SYNTHROID, LEVOTHROID) 150 MCG tablet Give 300 mcg by tube daily.             Thiamine HCl (VITAMIN B-1) 100 MG tablet Give 100 mg by tube every morning.             vitamin C (ASCORBIC ACID) 500 MG tablet Give 500 mg by tube 2 (two) times daily.             zinc sulfate 220 MG capsule Give 220 mg by tube every morning.  sodium chloride 0.9 % SOLN 150 mL with vancomycin 1000 MG SOLR 750 mg Inject 750 mg into the vein daily.             Filed Vitals:   02/20/11 1446  BP: 117/80  Pulse: 107  Temp: 97.5 F (36.4 C)  Resp: 20    LUA Picc in place, colostomy to LLQ (clean dry intact), peg tube to RUQ (clean dry intact). Stage III pressure ulcer to sacrum, dressing dry clean and intact. Stage II pressure ulcers to bilateral posterior ankles. A/Ox3, bedridden. Denies pain. Will monitor until off unit.  An After Visit Summary was printed and given to the patient. Patient escorted via stretcher to [facility] via ambulance.  [place], [employer] RN 02/20/2011 5:00 PM

## 2011-02-20 NOTE — Plan of Care (Signed)
Problem: Phase I Progression Outcomes Goal: OOB as tolerated unless otherwise ordered Outcome: Adequate for Discharge Patient is bed ridden at home and is also here. Goal: Voiding-avoid urinary catheter unless indicated Outcome: Not Applicable Date Met:  02/20/11 Patient is chronic foley use.

## 2011-02-20 NOTE — Discharge Summary (Signed)
DISCHARGE SUMMARY  [name]  MR#: [id number]  DOB:08-22-1935  Date of Admission: 02/13/2011 Date of Discharge: 02/20/2011  Attending Physician: Marthann Schiller, MD  Patient's PCP:  Dr. Fulton Mole  Consults:  Vertell Novak., MD, with [facility].  Procedures:   1.  Upper Endoscopy 02/16/11 by Dr. Willis Modena 2.  Colonoscopy 02/16/11 by Dr. Willis Modena  Discharge Diagnoses: Present on Admission:  GI Bleeding. Patient has received one unit of PRBCs on 02/18/11  Acute Delirium / encephalopathy has resolved and patient is currently at baseline mental status. 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 Hypothyroidism: The patient's TSH was elevated this admission History of Stroke - With residual right sided weakness, bed bound. History of Dysphagia secondary to stroke, PEG tube in place. Receives PEG tube feeds.  DM 2  HTN   Current Discharge Medication List    START taking these medications   Details  sodium chloride 0.9 % SOLN 150 mL with vancomycin 1000 MG SOLR 750 mg Inject 750 mg into the vein daily. Qty: 150 mL, Refills: 0      CONTINUE these medications which have NOT CHANGED   Details  acetaminophen (TYLENOL) 160 MG/5ML elixir Give 640 mg by tube every 6 (six) hours as needed. For pain     aspirin 325 MG tablet Give 325 mg by tube daily.      atropine 1 % ophthalmic solution Take 2 drops by mouth every 6 (six) hours.      citalopram (CELEXA) 10 MG/5ML suspension Give 10 mg by tube daily.      ferrous sulfate 300 (60 FE) MG/5ML syrup Give 300 mg by tube 2 (two) times daily.      furosemide (LASIX) 20 MG tablet Give 20 mg by tube daily.      gabapentin (NEURONTIN) 300 MG capsule Give 300 mg by tube at bedtime.      gentamicin (GARAMYCIN) 0.8-0.9 MG/ML-% Inject 80 mg into the vein every 12 (twelve) hours.      insulin glargine (LANTUS) 100 UNIT/ML injection Inject 10 Units into the  skin at bedtime.      levothyroxine (SYNTHROID, LEVOTHROID) 150 MCG tablet Give 300 mcg by tube daily.      metFORMIN (GLUCOPHAGE) 500 MG tablet Give 500 mg by tube daily.      metoprolol tartrate (LOPRESSOR) 25 MG tablet Give 25 mg by tube 2 (two) times daily.      mirtazapine (REMERON) 15 MG tablet Give 15 mg by tube at bedtime.      Multiple Vitamins-Minerals (MULTIVITAMINS THER. W/MINERALS) TABS Give 1 tablet by tube daily.      potassium chloride 20 MEQ/15ML (10%) solution Give 10 mEq by tube daily.      pravastatin (PRAVACHOL) 40 MG tablet Give 40 mg by tube at bedtime.      ranitidine (ZANTAC) 75 MG tablet Give 150 mg by tube 2 (two) times daily.      saccharomyces boulardii (FLORASTOR) 250 MG capsule Give 250 mg by tube 2 (two) times daily.      sennosides-docusate sodium (SENOKOT-S) 8.6-50 MG tablet Give 2 tablets by tube 2 (two) times daily.      Thiamine HCl (VITAMIN B-1) 100 MG tablet Give 100 mg by tube every morning.      vitamin C (ASCORBIC ACID) 500 MG tablet Give 500 mg by tube 2 (two) times daily.      zinc sulfate 220 MG capsule Give 220 mg  by tube every morning.        STOP taking these medications     sodium chloride 0.9 % SOLN 250 mL with vancomycin 1000 MG SOLR      sodium chloride 0.9 % SOLN 500 mL with vancomycin 1000 MG SOLR           Hospital Course: CHIEF COMPLAINT: Blood in the ostomy bag.  HISTORY OF PRESENT ILLNESS: The patient is a 75 year old African  American male with a past medical history of stroke, hypertension,  pneumonia, anorexia, endocarditis who was in his usual state of health  till earlier today when in the nursing home it was noted that he had  blood in his ostomy bag. The patient was subsequently sent over to the  emergency department for further evaluation. According to the ED  physician, there was about 30 to 50 mL of blood in the back. There was  no active bleeding that was noted. The patient denies any abdominal    pain, nausea, vomiting, fever or chills. There was no lightheadedness.  The patient is bed bound. Denies taking any blood thinners at home.  Denies any history of constipation to the ostomy bag. As far as we  know, he has never had similar symptoms the past.   Hospital course by diagnosis: GI Bleeding.  The patient was seen in consultation by GI and at first was managed conservatively with watchful waiting.  [name] had a nuclear medicine RBC bleeding scan that was negative.  Unfortunately dark melena continued to exude into the ostomy bag.  On 11/1 Dr. Dulce Sellar determined it was best to proceed with endoscopic procedures.  On 11/2 the patient underwent both upper and lower endoscopy.  Results of upper endo:  PEG with jejunostomy extension reaches at least the distal duodenum.  Otherwise normal endo.  No sources of bleeding identified. Results of colonoscopy:  Friable mucosa diffusely of distal limb of colostomy.  Appearance is not typical of colitis.  Prominent ileocecal valve biopsied.  Otherwise normal colonoscopy. Recommendations:  Resume PEG tube feedings.  Follow clinically.  After the endoscopic procedures no further blood has been seen coming from the ostomy.  The patient's hemoglobin has remained stable after receiving 1 unit of PRBCs on 11/4.  Current Hgb is 9.4.  Stool in ostomy is brown.  Acute Delirium / encephalopathy has resolved and patient is currently at baseline mental status.  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  This remained quiet during his admission.  Vancomycin levels were checked and the dosage was adjusted.  The patient has follow up with Infectious Disease on 11/29. (Dr. Drue Second)  Hypokalemia: repleted.  Hypothyroidism: The patient's TSH was elevated this admission (6.0).  No adjustment was made to his synthroid.  We recommend that his TSH be rechecked in the next several weeks.  History of Stroke - With residual right sided weakness, bed  bound. - Stable.  History of Dysphagia secondary to stroke, PEG tube in place. Receives PEG tube feeds.   DM 2 - Well controlled on current medications and diet.  HTN - Well controlled on current medications and diet.  Day of Discharge BP 109/75  Pulse 111  Temp(Src) 98.1 F (36.7 C) (Oral)  Resp 22  Ht 6\' 3"  (1.905 m)  Wt 99.7 kg (219 lb 12.8 oz)  BMI 27.47 kg/m2  SpO2 99%  Physical Exam: General: Alert and awake oriented x3 not in any acute distress.  HEENT: anicteric sclera, pupils reactive to light  and accommodation  CVS: S1-S2 clear no murmur rubs or gallops, Heart sounds slightly difficult to hear.  Chest: clear to auscultation bilaterally, no wheezing rales or rhonchi  Abdomen: soft nontender, nondistended, decreased bowel sounds, no organomegaly, ostomy in place, no frank blood or melena in ostomy bag.  PEG in place without signs of infection. Extremities: no cyanosis, clubbing or edema noted bilaterally. Nail fungus in lower extremity nails.  Psych: A&O x 3, Pleasant, cooperative.   Results for orders placed during the hospital encounter of 02/13/11 (from the past 24 hour(s))  GLUCOSE, CAPILLARY     Status: Abnormal   Collection Time   02/19/11 11:49 AM      Component Value Range   Glucose-Capillary 132 (*) 70 - 99 (mg/dL)  HEMOGLOBIN AND HEMATOCRIT, BLOOD     Status: Abnormal   Collection Time   02/19/11  1:35 PM      Component Value Range   Hemoglobin 9.3 (*) 13.0 - 17.0 (g/dL)   HCT 16.1 (*) 09.6 - 52.0 (%)  GLUCOSE, CAPILLARY     Status: Abnormal   Collection Time   02/19/11  4:49 PM      Component Value Range   Glucose-Capillary 121 (*) 70 - 99 (mg/dL)  GLUCOSE, CAPILLARY     Status: Normal   Collection Time   02/19/11  7:49 PM      Component Value Range   Glucose-Capillary 87  70 - 99 (mg/dL)   Comment 1 Notify RN     Comment 2 Documented in Chart    GLUCOSE, CAPILLARY     Status: Abnormal   Collection Time   02/19/11 11:21 PM      Component Value  Range   Glucose-Capillary 127 (*) 70 - 99 (mg/dL)   Comment 1 Notify RN     Comment 2 Documented in Chart    CBC     Status: Abnormal   Collection Time   02/20/11  3:10 AM      Component Value Range   WBC 4.6  4.0 - 10.5 (K/uL)   RBC 3.40 (*) 4.22 - 5.81 (MIL/uL)   Hemoglobin 9.4 (*) 13.0 - 17.0 (g/dL)   HCT 04.5 (*) 40.9 - 52.0 (%)   MCV 85.6  78.0 - 100.0 (fL)   MCH 27.6  26.0 - 34.0 (pg)   MCHC 32.3  30.0 - 36.0 (g/dL)   RDW 81.1 (*) 91.4 - 15.5 (%)   Platelets 183  150 - 400 (K/uL)  BASIC METABOLIC PANEL     Status: Abnormal   Collection Time   02/20/11  3:10 AM      Component Value Range   Sodium 140  135 - 145 (mEq/L)   Potassium 3.5  3.5 - 5.1 (mEq/L)   Chloride 109  96 - 112 (mEq/L)   CO2 23  19 - 32 (mEq/L)   Glucose, Bld 131 (*) 70 - 99 (mg/dL)   BUN 14  6 - 23 (mg/dL)   Creatinine, Ser 7.82 (*) 0.50 - 1.35 (mg/dL)   Calcium 9.3  8.4 - 95.6 (mg/dL)   GFR calc non Af Amer 49 (*) >90 (mL/min)   GFR calc Af Amer 57 (*) >90 (mL/min)  MAGNESIUM     Status: Normal   Collection Time   02/20/11  3:10 AM      Component Value Range   Magnesium 1.8  1.5 - 2.5 (mg/dL)  GLUCOSE, CAPILLARY     Status: Abnormal   Collection Time   02/20/11  3:31 AM      Component Value Range   Glucose-Capillary 113 (*) 70 - 99 (mg/dL)  GLUCOSE, CAPILLARY     Status: Abnormal   Collection Time   02/20/11  7:53 AM      Component Value Range   Glucose-Capillary 143 (*) 70 - 99 (mg/dL)  VANCOMYCIN, TROUGH     Status: Normal   Collection Time   02/20/11  9:30 AM      Component Value Range   Vancomycin Tr 16.9  10.0 - 20.0 (ug/mL)    Disposition: Stable to return to Skilled Nursing Facility   Follow-up Appts: Discharge Orders    Future Appointments: Provider: Department: Dept Phone: Center:   03/15/2011 9:00 AM Judyann Munson, MD [facility] [phone] [facility]      Follow-up with: 1.  Dr. Judyann Munson on 11/29 regarding Endocarditis. 2.  Dr. Fulton Mole or PCP at SNF  regarding  Anemia, and TSH Level. 3.  Speech Therapy regarding dysphagia.   Signed: Mammie Russian 02/20/2011, 11:35 AM   Pt seen and examined and discussed with PAC, Algis Downs. Pt without any complaints today. Discussed his present clinical condition and follow-up care with Mr. Diffley, his wife and sister-in-law. Agree with above plan of care.

## 2011-02-20 NOTE — Progress Notes (Signed)
ANTIBIOTIC CONSULT NOTE - FOLLOW UP  Pharmacy Consult for Vancomycin,Gentamicin  Indication: endocarditis  No Known Allergies  Patient Measurements: Height: 6\' 3"  (190.5 cm) (Entered for Cutover) Weight: 219 lb 12.8 oz (99.7 kg) IBW/kg (Calculated) : 84.5  Adjusted Body Weight: 91 (AG)  Vital Signs: Temp: 98.1 F (36.7 C) (11/06 0500) Temp src: Oral (11/06 0500) BP: 109/75 mmHg (11/06 0500) Pulse Rate: 111  (11/06 0500) Intake/Output from previous day: 11/05 0701 - 11/06 0700 In: 1180 [I.V.:930; IV Piggyback:250] Out: 2100 [Urine:2000; Stool:100] Intake/Output from this shift: Total I/O In: 1160 [Other:950; NG/GT:60; IV Piggyback:150] Out: 1050 [Urine:600; Stool:450]  Labs:  [facility] 02/20/11 0310 02/19/11 1335 02/19/11 0335 02/18/11 0600  WBC 4.6 -- 5.4 3.9*  HGB 9.4* 9.3* 9.5* --  PLT 183 -- 188 176  LABCREA -- -- -- --  CREATININE 1.38* -- 1.34 1.37*   Estimated Creatinine Clearance: 57 ml/min (by C-G formula based on Cr of 1.38).  Basename 02/20/11 0930  VANCOTROUGH 16.9  VANCOPEAK --  VANCORANDOM --  GENTTROUGH --  GENTPEAK --  GENTRANDOM --  TOBRATROUGH --  TOBRAPEAK --  TOBRARND --  AMIKACINPEAK --  AMIKACINTROU --  AMIKACIN --     Microbiology: Recent Results (from the past 720 hour(s))  CULTURE, BLOOD (ROUTINE X 2)     Status: Normal   Collection Time   01/21/11  4:27 PM      Component Value Range Status Comment   Specimen Description BLOOD RIGHT HAND   Final    Special Requests     Final    Value: BOTTLES DRAWN AEROBIC AND ANAEROBIC 2CC BOTH BOTTLES   Setup Time     Final    Value: [id number] DEMOGRAPHIC UPDATE OCCURRED ON 10/21 AT 1127, QA FLAGS AND RANGES MAY NO LONGER BE VALID DEMOGRAPHIC UPDATE OCCURRED ON 10/21 AT 1130, QA FLAGS AND RANGES MAY NO LONGER BE VALID   Culture     Final    Value: ENTEROCOCCUS FAECALIS     Note: SUSCEPTIBILITIES PERFORMED ON PREVIOUS CULTURE WITHIN THE LAST 5 DAYS.     Note: Gram Stain Report Called  to,Read Back By and Verified With: STACY GILBERT 01/23/11 @ 1935 HAJAM DEMOGRAPHIC UPDATE OCCURRED ON 10/21 AT 1127, QA FLAGS AND RANGES MAY NO LONGER BE VALID DEMOGRAPHIC UPDATE OCCURRED ON 10/21 AT 1130, QA FLAGS AND      RANGES MAY NO LONGER BE VALID   Report Status     Final    Value: 01/26/2011 FINAL DEMOGRAPHIC UPDATE OCCURRED ON 10/21 AT 1127, QA FLAGS AND RANGES MAY NO LONGER BE VALID DEMOGRAPHIC UPDATE OCCURRED ON 10/21 AT 1130, QA FLAGS AND RANGES MAY NO LONGER BE VALID  CULTURE, BLOOD (ROUTINE X 2)     Status: Normal   Collection Time   01/21/11  4:32 PM      Component Value Range Status Comment   Specimen Description BLOOD LEFT ARM   Final    Special Requests     Final    Value: BOTTLES DRAWN AEROBIC AND ANAEROBIC  2CC BOTH BOTTLES   Setup Time     Final    Value: [id number] DEMOGRAPHIC UPDATE OCCURRED ON 10/21 AT 1127, QA FLAGS AND RANGES MAY NO LONGER BE VALID DEMOGRAPHIC UPDATE OCCURRED ON 10/21 AT 1130, QA FLAGS AND RANGES MAY NO LONGER BE VALID   Culture     Final    Value: ENTEROCOCCUS FAECALIS     Note: Gram Stain Report Called to,Read Back By and Verified With:  KIRSTIN KNIGHT @ 1245 01/23/11 WICKN DEMOGRAPHIC UPDATE OCCURRED ON 10/21 AT 1127, QA FLAGS AND RANGES MAY NO LONGER BE VALID DEMOGRAPHIC UPDATE OCCURRED ON 10/21 AT 1130, QA FLAGS AND      RANGES MAY NO LONGER BE VALID   Report Status     Final    Value: 01/26/2011 FINAL DEMOGRAPHIC UPDATE OCCURRED ON 10/21 AT 1127, QA FLAGS AND RANGES MAY NO LONGER BE VALID DEMOGRAPHIC UPDATE OCCURRED ON 10/21 AT 1130, QA FLAGS AND RANGES MAY NO LONGER BE VALID   Organism ID, Bacteria ENTEROCOCCUS FAECALIS   Final   CULTURE, BLOOD (ROUTINE X 2)     Status: Normal   Collection Time   01/23/11  4:30 PM      Component Value Range Status Comment   Specimen Description BLOOD LEFT ARM   Final    Special Requests BOTTLES DRAWN AEROBIC ONLY 4CC   Final    Setup Time     Final    Value: [id number] DEMOGRAPHIC UPDATE OCCURRED ON 10/21  AT 1127, QA FLAGS AND RANGES MAY NO LONGER BE VALID DEMOGRAPHIC UPDATE OCCURRED ON 10/21 AT 1130, QA FLAGS AND RANGES MAY NO LONGER BE VALID   Culture     Final    Value: NO GROWTH 5 DAYS DEMOGRAPHIC UPDATE OCCURRED ON 10/21 AT 1127, QA FLAGS AND RANGES MAY NO LONGER BE VALID DEMOGRAPHIC UPDATE OCCURRED ON 10/21 AT 1130, QA FLAGS AND RANGES MAY NO LONGER BE VALID   Report Status     Final    Value: 01/29/2011 FINAL DEMOGRAPHIC UPDATE OCCURRED ON 10/21 AT 1127, QA FLAGS AND RANGES MAY NO LONGER BE VALID DEMOGRAPHIC UPDATE OCCURRED ON 10/21 AT 1130, QA FLAGS AND RANGES MAY NO LONGER BE VALID  CULTURE, BLOOD (ROUTINE X 2)     Status: Normal   Collection Time   01/24/11  6:30 PM      Component Value Range Status Comment   Specimen Description BLOOD LEFT ARM   Final    Special Requests BOTTLES DRAWN AEROBIC AND ANAEROBIC 6CC   Final    Setup Time     Final    Value: [id number] DEMOGRAPHIC UPDATE OCCURRED ON 10/21 AT 1127, QA FLAGS AND RANGES MAY NO LONGER BE VALID DEMOGRAPHIC UPDATE OCCURRED ON 10/21 AT 1130, QA FLAGS AND RANGES MAY NO LONGER BE VALID   Culture     Final    Value: ENTEROCOCCUS SPECIES     Note: SUSCEPTIBILITIES PERFORMED ON PREVIOUS CULTURE WITHIN THE LAST 5 DAYS.     Note: Gram Stain Report Called to,Read Back By and Verified With: RN K. TELEDO ON 01/28/11 AT 2055 BY TEDAR DEMOGRAPHIC UPDATE OCCURRED ON 10/21 AT 1127, QA FLAGS AND RANGES MAY NO LONGER BE VALID DEMOGRAPHIC UPDATE OCCURRED ON 10/21 AT 1130, QA FLAGS      AND RANGES MAY NO LONGER BE VALID   Report Status     Final    Value: 01/31/2011 FINAL DEMOGRAPHIC UPDATE OCCURRED ON 10/21 AT 1127, QA FLAGS AND RANGES MAY NO LONGER BE VALID DEMOGRAPHIC UPDATE OCCURRED ON 10/21 AT 1130, QA FLAGS AND RANGES MAY NO LONGER BE VALID  CULTURE, BLOOD (ROUTINE X 2)     Status: Normal   Collection Time   01/24/11  6:45 PM      Component Value Range Status Comment   Specimen Description BLOOD RIGHT HAND   Final    Special Requests  BOTTLES DRAWN AEROBIC ONLY 6CC   Final    Setup Time  Final    Value: [id number] DEMOGRAPHIC UPDATE OCCURRED ON 10/21 AT 1127, QA FLAGS AND RANGES MAY NO LONGER BE VALID DEMOGRAPHIC UPDATE OCCURRED ON 10/21 AT 1130, QA FLAGS AND RANGES MAY NO LONGER BE VALID   Culture     Final    Value: ENTEROCOCCUS SPECIES     Note: Gram Stain Report Called to,Read Back By and Verified With: RN M. SEAGRAVES ON 01/28/11 AT 1540 BY TEDAR DEMOGRAPHIC UPDATE OCCURRED ON 10/21 AT 1127, QA FLAGS AND RANGES MAY NO LONGER BE VALID DEMOGRAPHIC UPDATE OCCURRED ON 10/21 AT 1130, QA FLAGS      AND RANGES MAY NO LONGER BE VALID   Report Status     Final    Value: 01/31/2011 FINAL DEMOGRAPHIC UPDATE OCCURRED ON 10/21 AT 1127, QA FLAGS AND RANGES MAY NO LONGER BE VALID DEMOGRAPHIC UPDATE OCCURRED ON 10/21 AT 1130, QA FLAGS AND RANGES MAY NO LONGER BE VALID   Organism ID, Bacteria ENTEROCOCCUS SPECIES   Final   CULTURE, BLOOD (ROUTINE X 2)     Status: Normal   Collection Time   01/28/11  6:00 PM      Component Value Range Status Comment   Specimen Description BLOOD LEFT HAND   Final    Special Requests BOTTLES DRAWN AEROBIC ONLY 2CC   Final    Setup Time     Final    Value: [id number] DEMOGRAPHIC UPDATE OCCURRED ON 10/21 AT 1127, QA FLAGS AND RANGES MAY NO LONGER BE VALID DEMOGRAPHIC UPDATE OCCURRED ON 10/21 AT 1130, QA FLAGS AND RANGES MAY NO LONGER BE VALID   Culture     Final    Value: NO GROWTH 5 DAYS DEMOGRAPHIC UPDATE OCCURRED ON 10/21 AT 1127, QA FLAGS AND RANGES MAY NO LONGER BE VALID DEMOGRAPHIC UPDATE OCCURRED ON 10/21 AT 1130, QA FLAGS AND RANGES MAY NO LONGER BE VALID   Report Status     Final    Value: 02/04/2011 FINAL DEMOGRAPHIC UPDATE OCCURRED ON 10/21 AT 1127, QA FLAGS AND RANGES MAY NO LONGER BE VALID DEMOGRAPHIC UPDATE OCCURRED ON 10/21 AT 1130, QA FLAGS AND RANGES MAY NO LONGER BE VALID  CULTURE, BLOOD (ROUTINE X 2)     Status: Normal   Collection Time   01/28/11 10:15 PM      Component Value  Range Status Comment   Specimen Description BLOOD L PICC   Final    Special Requests     Final    Value: IMMUNE:NORM VAR BOTTLES DRAWN AEROBIC AND ANAEROBIC   Setup Time     Final    Value: [id number] DEMOGRAPHIC UPDATE OCCURRED ON 10/21 AT 1127, QA FLAGS AND RANGES MAY NO LONGER BE VALID DEMOGRAPHIC UPDATE OCCURRED ON 10/21 AT 1130, QA FLAGS AND RANGES MAY NO LONGER BE VALID   Culture     Final    Value: NO GROWTH 5 DAYS DEMOGRAPHIC UPDATE OCCURRED ON 10/21 AT 1127, QA FLAGS AND RANGES MAY NO LONGER BE VALID DEMOGRAPHIC UPDATE OCCURRED ON 10/21 AT 1130, QA FLAGS AND RANGES MAY NO LONGER BE VALID   Report Status     Final    Value: 02/04/2011 FINAL DEMOGRAPHIC UPDATE OCCURRED ON 10/21 AT 1127, QA FLAGS AND RANGES MAY NO LONGER BE VALID DEMOGRAPHIC UPDATE OCCURRED ON 10/21 AT 1130, QA FLAGS AND RANGES MAY NO LONGER BE VALID  MRSA PCR SCREENING     Status: Normal   Collection Time   02/13/11 11:52 PM      Component Value Range  Status Comment   MRSA by PCR NEGATIVE  NEGATIVE  Final     Anti-infectives     Start     Dose/Rate Route Frequency Ordered Stop   02/20/11 0000   sodium chloride 0.9 % SOLN 150 mL with vancomycin 1000 MG SOLR 750 mg     Comments: The patient should receive 750 mg of Vancomycin infused every 24 hours through 03/10/2011      750 mg 150 mL/hr over 60 Minutes Intravenous Every 24 hours 02/20/11 1120            Assessment: 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Vanc trough is appropriate for goal 15-54mcg/ml, Gent trough was slightly elevated for goal of <1 mcg/ml on last assessment, gent trough was hung today before level could be drawn. Pt is afebrile, WBC WNL, Scr stable. UOP reported as 0.40ml/kg/hr for last 24h-remains stable.   Goal of Therapy:  Vancomycin trough level 15-20 mcg/ml Gentamicin trough level <2 mcg/ml  Plan:  1. Continue current Gentamicin dose, will check Gent trough at 1330 tomorrow, timed collection, 30 min prior  to next dose due if patient is still here. 2. Continue current Vancomycin dose,no changes needed 3. Will monitor Scr/UOP daily.  Mirna Mires Krishnavadan 02/20/2011,2:26 PM

## 2011-02-21 NOTE — Progress Notes (Signed)
Pt had not been picked up by EMS by 2100, so EMS was called again.  Nursing home was called and RN verified that they were still expecting pt and it was an appropriate time.  EMS picked up pt and placed in stretcher.  Pt PICC had been flushed by IV RN, PEG tube was intact and disconnected, and colostomy was intact.  Skin was dry and intact, feet dry and flaky.  Pt alert and oriented x3, very quiet.  CBG was 75 and WNL before departure.  No belongings left with pt, as his wife had previously taken home.  Angelique Blonder, RN

## 2011-02-23 ENCOUNTER — Other Ambulatory Visit: Payer: Self-pay | Admitting: Internal Medicine

## 2011-02-23 ENCOUNTER — Observation Stay (HOSPITAL_COMMUNITY)
Admission: RE | Admit: 2011-02-23 | Discharge: 2011-02-23 | Disposition: A | Discharge status: Home / Self Care | Payer: Medicare Other | Source: Ambulatory Visit | Attending: Internal Medicine | Admitting: Internal Medicine

## 2011-02-23 ENCOUNTER — Telehealth (HOSPITAL_COMMUNITY): Payer: Self-pay | Admitting: Radiology

## 2011-02-23 DIAGNOSIS — I38 Endocarditis, valve unspecified: Secondary | ICD-10-CM

## 2011-02-23 NOTE — Telephone Encounter (Signed)
Patient brought to [facility] IR for PICC exchange.  PICC was functioning.  I attempted to notify nursing facility, however could not contact a RN who was familiar with the patient.  I notified the ordering physician who canceled procedure.  Progress note written for nursing staff at facility.  Wife made aware.

## 2011-02-23 NOTE — Progress Notes (Signed)
Patient scheduled in IR for PICC exchange due to inability to aspirate blood from PICC for blood draws.  Assessed PICC upon arrival.  Purple lumen flushes and aspirates freely.  Red lumen flushes freely and aspirates slowly.  Attempted to contact RN at nursing facility.  Could not get anyone on the phone who was familiar with patient.  Contacted ordering physician who canceled procedure.  Progress note written for nursing staff at facility.  Patient made aware.  Patient's wife contacted by telephone and made aware.  Patient was returned to facility by ambulance.

## 2011-02-26 ENCOUNTER — Emergency Department (HOSPITAL_COMMUNITY)
Admission: EM | Admit: 2011-02-26 | Discharge: 2011-02-26 | Disposition: A | Discharge status: Home / Self Care | Payer: Medicare Other | Attending: Emergency Medicine | Admitting: Emergency Medicine

## 2011-02-26 ENCOUNTER — Encounter: Payer: Self-pay | Admitting: Emergency Medicine

## 2011-02-26 DIAGNOSIS — Y849 Medical procedure, unspecified as the cause of abnormal reaction of the patient, or of later complication, without mention of misadventure at the time of the procedure: Secondary | ICD-10-CM | POA: Insufficient documentation

## 2011-02-26 DIAGNOSIS — T82898A Other specified complication of vascular prosthetic devices, implants and grafts, initial encounter: Secondary | ICD-10-CM | POA: Insufficient documentation

## 2011-02-26 DIAGNOSIS — Z79899 Other long term (current) drug therapy: Secondary | ICD-10-CM | POA: Insufficient documentation

## 2011-02-26 DIAGNOSIS — R29898 Other symptoms and signs involving the musculoskeletal system: Secondary | ICD-10-CM | POA: Insufficient documentation

## 2011-02-26 DIAGNOSIS — I69998 Other sequelae following unspecified cerebrovascular disease: Secondary | ICD-10-CM | POA: Insufficient documentation

## 2011-02-26 DIAGNOSIS — T82838A Hemorrhage of vascular prosthetic devices, implants and grafts, initial encounter: Secondary | ICD-10-CM

## 2011-02-26 DIAGNOSIS — E039 Hypothyroidism, unspecified: Secondary | ICD-10-CM | POA: Insufficient documentation

## 2011-02-26 HISTORY — DX: Hypothyroidism, unspecified: E03.9

## 2011-02-26 HISTORY — DX: Reserved for inherently not codable concepts without codable children: IMO0001

## 2011-02-26 HISTORY — DX: Syphilitic endocarditis: A52.03

## 2011-02-26 HISTORY — DX: Cerebral infarction, unspecified: I63.9

## 2011-02-26 HISTORY — DX: Essential (primary) hypertension: I10

## 2011-02-26 HISTORY — DX: Gastro-esophageal reflux disease without esophagitis: K21.9

## 2011-02-26 HISTORY — DX: Anorexia: R63.0

## 2011-02-26 NOTE — ED Provider Notes (Signed)
History     CSN: [id number] Arrival date & time: 02/26/2011  7:38 PM   First MD Initiated Contact with Patient 02/26/11 2029      Chief Complaint  Patient presents with  . Bleeding/Bruising    bleeding from picc line that was just recently placed.     (Consider location/radiation/quality/duration/timing/severity/associated sxs/prior treatment) HPI Patient states this afternoon he had a PICC line placed at another facility. Since that time apparently he is having persistent bleeding at that site. Patient was brought in by EMS to have it evaluated. Patient has complex medical problems including stroke hypertension endocarditis. He denies any other acute complaints at this time. Patient is to be getting antibiotics through the PICC line. Patient denies any fevers or chills. No numbness. No weakness in the extremity or the PICC line was placed. He does have chronic weakness in his right arm from an old stroke. Past Medical History  Diagnosis Date  . Stroke   . Hypertension   . Endocarditis, mitral valve, syphilitic   . Hypothyroidism   . Anorexia   . Dysphagia   . Reflux     Past Surgical History  Procedure Date  . Colostomy     History reviewed. No pertinent family history.  History  Substance Use Topics  . Smoking status: Unknown If Ever Smoked  . Smokeless tobacco: Not on file  . Alcohol Use: No      Review of Systems  All other systems reviewed and are negative.    Allergies  Review of patient's allergies indicates no known allergies.  Home Medications   Current Outpatient Rx  Name Route Sig Dispense Refill  . ACETAMINOPHEN 160 MG/5ML PO ELIX Tube Give 640 mg by tube every 6 (six) hours as needed. For pain     . ASPIRIN 325 MG PO TABS Tube Give 325 mg by tube daily.      . ATROPINE SULFATE 1 % OP SOLN Oral Take 2 drops by mouth every 6 (six) hours.      Marland Kitchen CITALOPRAM HYDROBROMIDE 10 MG/5ML PO SOLN Tube Give 10 mg by tube daily.      Marland Kitchen FERROUS SULFATE 300  (60 FE) MG/5ML PO SYRP Tube Give 300 mg by tube 2 (two) times daily.      . FUROSEMIDE 20 MG PO TABS Tube Give 20 mg by tube daily.      Marland Kitchen GABAPENTIN 300 MG PO CAPS Tube Give 300 mg by tube at bedtime.      . GENTAMICIN IN SALINE 0.8-0.9 MG/ML-% IV SOLN Intravenous Inject 80 mg into the vein every 12 (twelve) hours.      . INSULIN GLARGINE 100 UNIT/ML [place] SOLN Subcutaneous Inject 10 Units into the skin at bedtime.      Marland Kitchen LEVOTHYROXINE SODIUM 150 MCG PO TABS Tube Give 300 mcg by tube daily.      Marland Kitchen METFORMIN HCL 500 MG PO TABS Tube Give 500 mg by tube daily.      Marland Kitchen METOPROLOL TARTRATE 25 MG PO TABS Tube Give 25 mg by tube 2 (two) times daily.      Marland Kitchen MIRTAZAPINE 15 MG PO TABS Tube Give 15 mg by tube at bedtime.      Carma Leaven M PLUS PO TABS Tube Give 1 tablet by tube daily.      Marland Kitchen POTASSIUM CHLORIDE 20 MEQ/15ML (10%) PO LIQD Tube Give 10 mEq by tube daily.      Marland Kitchen PRAVASTATIN SODIUM 40 MG PO TABS Tube Give  40 mg by tube at bedtime.      Marland Kitchen RANITIDINE HCL 75 MG PO TABS Tube Give 150 mg by tube 2 (two) times daily.      Marland Kitchen SACCHAROMYCES BOULARDII 250 MG PO CAPS Tube Give 250 mg by tube 2 (two) times daily.      Raul Del SODIUM 8.6-50 MG PO TABS Tube Give 2 tablets by tube 2 (two) times daily.      Marland Kitchen VANCOMYCIN 750 MG IVPB Intravenous Inject 750 mg into the vein daily. 150 mL 0    The patient should receive 750 mg of Vancomycin in ...  . VITAMIN B-1 100 MG PO TABS Tube Give 100 mg by tube every morning.      Marland Kitchen VITAMIN C 500 MG PO TABS Tube Give 500 mg by tube 2 (two) times daily.      Marland Kitchen ZINC SULFATE 220 MG PO CAPS Tube Give 220 mg by tube every morning.        BP 105/67  Pulse 70  Temp(Src) 97.9 F (36.6 C) (Oral)  SpO2 100%  Physical Exam  Nursing note and vitals reviewed. Constitutional: He appears well-developed and well-nourished. No distress.  HENT:  Head: Normocephalic and atraumatic.  Right Ear: External ear normal.  Left Ear: External ear normal.  Eyes: Conjunctivae are normal.  Right eye exhibits no discharge. Left eye exhibits no discharge. No scleral icterus.  Neck: Neck supple. No tracheal deviation present.  Cardiovascular: Normal rate, regular rhythm and intact distal pulses.   Pulmonary/Chest: Effort normal and breath sounds normal. No stridor. No respiratory distress. He has no wheezes. He has no rales.  Abdominal: Soft. Bowel sounds are normal. He exhibits no distension. There is no tenderness. There is no rebound and no guarding.  Musculoskeletal: He exhibits no edema and no tenderness.       Left upper extremity with PICC line in place, large adherent blood clot, large dressing over the wound initially was removed, dressing and clot were removed and there is no active bleeding at this time  Neurological: He is alert. He has normal strength. No sensory deficit. Cranial nerve deficit:  no gross defecits noted. He exhibits normal muscle tone. He displays no seizure activity. Coordination normal.       Weakness right upper extremity  Skin: Skin is warm and dry. No rash noted.  Psychiatric: He has a normal mood and affect.    ED Course  Procedures (including critical care time)   Labs Reviewed  CBC  DIFFERENTIAL  BASIC METABOLIC PANEL   No results found.   1. Bleeding from PICC line       MDM  Patient decided that he did not want to have any blood tests done. Patient states that he does not want any blood drawn from his arms. At this point there is no bleeding at the PICC line site. Patient will be discharged back to his nursing facility.        Celene Kras, MD 02/26/11 2113

## 2011-02-26 NOTE — ED Notes (Signed)
Called [facility]tar to come get the patient and take him back to the nursing home.   Called report to nursing home.

## 2011-02-26 NOTE — ED Notes (Signed)
ZOX:[id number]<AV> Expected date:02/26/11<BR> Expected time: 7:10 PM<BR> Means of arrival:Ambulance<BR> Comments:<BR> EMS 110GC, 74 yom Picc line out w hemorrhage

## 2011-02-26 NOTE — ED Notes (Signed)
Patient is refusing all lab work.  Informed lab, md, and informed patient how important the lab work is.  Patient states quote " i am going to see jesus anyway, i am 75 y/o and i dont want any lab work. "

## 2011-02-26 NOTE — ED Notes (Signed)
Per ems, patient has a picc line just recently placed and is currently bleeding from picc line and bleeding currently wrapped and managed

## 2011-02-26 NOTE — ED Notes (Signed)
Patient stable upon discharge back to the nursing home.

## 2011-02-26 NOTE — ED Notes (Signed)
Patient picc line dressing changed and applied quick clot to site.  Patient site now controlled of bleeding at this time.

## 2011-03-15 ENCOUNTER — Ambulatory Visit: Payer: Medicare Other | Admitting: Internal Medicine

## 2011-03-20 ENCOUNTER — Encounter: Payer: Self-pay | Admitting: Cardiovascular Disease

## 2011-03-23 ENCOUNTER — Encounter: Payer: Self-pay | Admitting: Cardiovascular Disease

## 2011-04-03 ENCOUNTER — Encounter: Payer: Self-pay | Admitting: Cardiovascular Disease

## 2011-04-08 ENCOUNTER — Encounter: Payer: Self-pay | Admitting: Cardiovascular Disease

## 2011-04-25 ENCOUNTER — Institutional Professional Consult (permissible substitution): Payer: Medicare Other | Admitting: Cardiovascular Disease

## 2011-05-21 ENCOUNTER — Encounter: Payer: Self-pay | Admitting: *Deleted

## 2011-05-21 ENCOUNTER — Encounter (HOSPITAL_BASED_OUTPATIENT_CLINIC_OR_DEPARTMENT_OTHER): Payer: Medicare Other | Attending: Internal Medicine

## 2011-05-21 DIAGNOSIS — K219 Gastro-esophageal reflux disease without esophagitis: Secondary | ICD-10-CM | POA: Insufficient documentation

## 2011-05-21 DIAGNOSIS — Z79899 Other long term (current) drug therapy: Secondary | ICD-10-CM | POA: Insufficient documentation

## 2011-05-21 DIAGNOSIS — D649 Anemia, unspecified: Secondary | ICD-10-CM | POA: Insufficient documentation

## 2011-05-21 DIAGNOSIS — Z950 Presence of cardiac pacemaker: Secondary | ICD-10-CM | POA: Insufficient documentation

## 2011-05-21 DIAGNOSIS — I69959 Hemiplegia and hemiparesis following unspecified cerebrovascular disease affecting unspecified side: Secondary | ICD-10-CM | POA: Insufficient documentation

## 2011-05-21 DIAGNOSIS — E785 Hyperlipidemia, unspecified: Secondary | ICD-10-CM | POA: Insufficient documentation

## 2011-05-21 DIAGNOSIS — E039 Hypothyroidism, unspecified: Secondary | ICD-10-CM | POA: Insufficient documentation

## 2011-05-21 DIAGNOSIS — L89109 Pressure ulcer of unspecified part of back, unspecified stage: Secondary | ICD-10-CM | POA: Insufficient documentation

## 2011-05-21 DIAGNOSIS — Z933 Colostomy status: Secondary | ICD-10-CM | POA: Insufficient documentation

## 2011-05-21 DIAGNOSIS — E119 Type 2 diabetes mellitus without complications: Secondary | ICD-10-CM | POA: Insufficient documentation

## 2011-05-21 DIAGNOSIS — L8990 Pressure ulcer of unspecified site, unspecified stage: Secondary | ICD-10-CM | POA: Insufficient documentation

## 2011-05-21 DIAGNOSIS — Z931 Gastrostomy status: Secondary | ICD-10-CM | POA: Insufficient documentation

## 2011-05-22 ENCOUNTER — Encounter: Payer: Self-pay | Admitting: Cardiovascular Disease

## 2011-05-22 ENCOUNTER — Ambulatory Visit (INDEPENDENT_AMBULATORY_CARE_PROVIDER_SITE_OTHER): Payer: Medicare Other | Admitting: Cardiovascular Disease

## 2011-05-22 DIAGNOSIS — I38 Endocarditis, valve unspecified: Secondary | ICD-10-CM

## 2011-05-22 DIAGNOSIS — Z950 Presence of cardiac pacemaker: Secondary | ICD-10-CM | POA: Insufficient documentation

## 2011-05-22 NOTE — Assessment & Plan Note (Signed)
[name] presents at the request of his medical doctor for consideration for transesophageal echo. He has already had an echocardiogram and has known endocarditis involving the mitral valve.  He's not having any signs or symptoms of congestive heart failure. He's not having any recurrent fevers.  He is also not a candidate for cardiothoracic surgery. I do not think that he would live through the surgery.  At this point I do not think there is any indication for repeat transesophageal echo. I did not see if any specific notes regarding this request at the single line reason for office visit suggest that the consultation was toevaluate him for a transesophageal echo.  I'll see him on an as-needed basis.

## 2011-05-22 NOTE — Progress Notes (Signed)
[facility]  NAMEGARTH, DIFFLEY NO.:  [id number]  MEDICAL RECORD NO.:  [id number]      DATE OF BIRTH:  1935/08/15  PHYSICIAN:  Jonelle Sports. Junius Faucett, M.D.  VISIT DATE:  05/21/2011                                  OFFICE VISIT   HISTORY:  This 76 year old black male is seen on referral from the [facility] for evaluation of recent change in a decubitus ulcer in the sacral area.  The patient's history is apparently quite complex and the information that came with him is less than detailed and he is simply not able to give a good history of his own.  Apparently, during some cervical spine surgery some 2 years ago, he sustained a stroke leaving him with right hemiplegia and a generalized failure to thrive.  He developed malnutrition and was unable to swallow and accordingly had a feeding tube placed.  He developed a decubitus ulcer in the sacral area which failed to heal with its primary point of entry just distal to the anus in the midline and in attempt to get this healed, he was given a diverting colostomy which is in place to this day.  In addition, he had a PEG tube placed for nutritional supplementation and eventually had a flap rolled over a large portion of his sacral area extending approximately 6-7 cm beyond the midline on both sides.  This was done at [facility].  The best I can ascertain is that the wound has never healed that he has done at the [facility] here despite all these various efforts to improve his general nutrition and his overall state of health and with the wound treated with irrigation and other topical measures in an attempt to get it to heal.  He apparently has continued to drain through that primary opening, which is right in the midline in the perianal area.  The nursing home noticed several days ago that he had several new areas spring open, one laterally on the right  buttock, another at approximately 3 o'clock position, another at approximately 1 o'clock position.  Both these being at the edge of the apparent flap location, and then another one more proximal in the midline some 4-5 cm from the second one overlying the flap area.  Apparently, all of these now communicate.  There has been no purulent drainage described.  No odor. There have been no fever or systemic symptoms.  He was sent here simply for our consultation as to how best now to deal with this lesion.  PAST MEDICAL HISTORY:  Notable for his skin flap surgery, which took place in 2011 and 2012.  His cervical spine surgery which was in early 2011.  He has had a cerebrovascular accident as mentioned.  He has a known type 2 diabetes.  He has compensated hypothyroidism.  He has otherwise nonspecified anemia.  He has gastroesophageal reflux and as indicated a gastric feeding tube per the abdominal wall, colostomy, and an indwelling Foley catheter.  He also has a history of endocarditis, hypertension, hyperlipidemia, and has had pacemaker implantation. Depression has likewise been a part of his illnesses.  He has no known medicinal allergies.  His regular medications include furosemide 20 mg daily, metoprolol  25 mg q.12 hours, gabapentin 300 mg at bedtime, aspirin 325 mg daily, atropine sulfate drops every 6 hours to his eyes, Celexa 20 mg daily, Cytomel 5 mcg daily, ferrous sulfate 300 mg b.i.d., Florastor 250 mg twice daily, Lantus insulin 10 units nightly at bedtime, metformin 500 mg daily, mirtazapine 15 mg at bedtime, multiple vitamin 1 daily, potassium chloride in the form of K-Dur 20 mEq daily, pravastatin 40 mg daily, ranitidine 150 mg daily, and Synthroid 375 mcg daily.  In addition, he takes trazodone 25 mg at bedtime, vitamin C, vitamin B1 and also takes Senokot-S tablets 2 twice daily.  Examination shows blood pressure 124/81, pulse 59 and regular, seemingly paced, respirations  18, temperature 97.5.  He is alert, cooperative but has some cognitive expressive impairment.  His skin is warm, dry, and of reasonable texture.  His mucous membranes are moist, but slightly pale. His chest is grossly clear.  His heart is in a slow paced rhythm. Abdomen shows presence of percutaneous gastric feeding tube and also colostomy, both of which appear to be functioning adequately.  His extremities show some chronic edema, and on the lateral aspect of his right ankle is a small abraded superficial ulcer measuring 1.6 x 1.4 cm. It has a clean base and no surrounding erythema, etc.  On the sacral area, there is evidence of a butterfly type flap graft in the sacral area extending on to both buttocks.  This seems to be reasonably well intact on the left buttocks, but on the right clearly it is still "floating" somewhat.  There are 4 openings, all of which communicate as described above in the present illness, and the total area that seems to be involved subcutaneously by this wound is somewhere on the order of 12.0 x 11.5 x 3.5 cm.  There is no significant drainage, no serous odor, and with probing the areas bled relatively freely.  IMPRESSION:  Unresolved sacral decubitus now with flap, not necrotic but still floating, and possibility of deep unclosed areas and/or abscessed base quite likely.  DISPOSITION:  What appear to me that the patient either needs an MRI and/or a CT with injections of these various tracks to better define the nature of what is going on underneath and then we will likely need the services again of a plastic surgeon to offer the best resolution.  Accordingly, referral of the patient will be established as soon as can reasonably be arranged with Dr. Mardene Speak at [facility].  Following that consultation the patient may return here on a p.r.n. basis.          ______________________________ Jonelle Sports Cheryll Cockayne, M.D.     RES/MEDQ   D:  05/21/2011  T:  05/22/2011  Job:  [id number]

## 2011-05-22 NOTE — Progress Notes (Signed)
Carney Corners Date of Birth  10/22/1935 [place]     [place] [facility]  [address]    [address]   [address] [place], [place]  [address]    [place], [place]  [address] [phone]  Fax  [phone]  [phone]  Fax [phone]  Problem List 1. Bacterial endocarditis 2. C-difficile colitis secondary to ABX for endocarditis. 3. CVA 4.  Bed / wheel chair bound 5.  Hypothyroidism 6.  Hyperlipidemia 7. Pacemaker - ( placed in [place], [place])  History of Present Illness:  Mr. Keech is a 76 year old gentleman. He is a past Public house manager at [employer].   Unfortunately, he had a stroke a year or so ago and has been bedbound since that time. He has had complications including heart block while at [facility] and had a pacemaker placed. He has developed bacterial endocarditis requiring 6 weeks of IV antibiotics. During the course of those antibiotics, he developed C. difficile colitis which required PO vancomycin.    I met him in Oct, 2012 when he was hospitalized with bacterial endocarditis.  TEE revealed  a vegetation on the mitral valve consistent with bacterial endocarditis. There is mitral valve prolapse with moderate (2+) mitral regurgitation.  There was normal left ventricular size and function. There were no sign  of any vegetations on the aortic valve, the tricuspid valve, nor the pulmonic valve. There was no left atrial thrombus appreciated.  He was seen by Dr. Cornelius Moras of cardiothoracic surgery.  We discussed the case and both of [place] agreed that patient did not have any indication for thoracic surgery and in addition he was an extremely poor candidate for any surgical intervention.  Mr. Lomas has not been seen since that time.  He's been living at a nursing home. He still receives tube feedings but is also eating some regular food by mouth. He denies any recurrent fevers. He denies any chest pain or shortness breath. He apparently has not been getting physical  therapy and spends all of his time lying in bed. He is almost completely paralyzed. He does not use either arm very well although he can move his arms and both legs. His speech is fairly normal. He remains fairly bright and alert.  He was sent to our office today to consider repeat transesophageal echo.  Current Outpatient Prescriptions on File Prior to Visit  Medication Sig Dispense Refill  . acetaminophen (TYLENOL) 160 MG/5ML elixir Give 640 mg by tube every 6 (six) hours as needed. For pain       . aspirin 325 MG tablet Give 325 mg by tube daily.        Marland Kitchen atropine 1 % ophthalmic solution Take 2 drops by mouth every 6 (six) hours.        . citalopram (CELEXA) 10 MG/5ML suspension Give 10 mg by tube daily.        . ferrous sulfate 300 (60 FE) MG/5ML syrup Give 300 mg by tube 2 (two) times daily.        . furosemide (LASIX) 20 MG tablet Give 20 mg by tube daily.        Marland Kitchen gabapentin (NEURONTIN) 300 MG capsule Give 300 mg by tube at bedtime.        Marland Kitchen gentamicin (GARAMYCIN) 0.8-0.9 MG/ML-% Inject 80 mg into the vein every 12 (twelve) hours.       . insulin glargine (LANTUS) 100 UNIT/ML injection Inject 10 Units into the skin at bedtime.        Marland Kitchen  levothyroxine (SYNTHROID, LEVOTHROID) 150 MCG tablet Give 300 mcg by tube daily.        . metFORMIN (GLUCOPHAGE) 500 MG tablet Give 500 mg by tube daily.        . metoprolol tartrate (LOPRESSOR) 25 MG tablet Give 25 mg by tube 2 (two) times daily.        . mirtazapine (REMERON) 15 MG tablet Give 15 mg by tube at bedtime.        . Multiple Vitamins-Minerals (MULTIVITAMINS THER. W/MINERALS) TABS Give 1 tablet by tube daily.        . potassium chloride 20 MEQ/15ML (10%) solution Give 10 mEq by tube daily.        . pravastatin (PRAVACHOL) 40 MG tablet Give 40 mg by tube at bedtime.        . ranitidine (ZANTAC) 75 MG tablet Give 150 mg by tube 2 (two) times daily.        Marland Kitchen saccharomyces boulardii (FLORASTOR) 250 MG capsule Give 250 mg by tube 2 (two) times  daily.        . sennosides-docusate sodium (SENOKOT-S) 8.6-50 MG tablet Give 2 tablets by tube 2 (two) times daily.        . sodium chloride 0.9 % SOLN 150 mL with vancomycin 1000 MG SOLR 750 mg Inject 750 mg into the vein daily.  150 mL  0  . Thiamine HCl (VITAMIN B-1) 100 MG tablet Give 100 mg by tube every morning.        . vitamin C (ASCORBIC ACID) 500 MG tablet Give 500 mg by tube 2 (two) times daily.        Marland Kitchen zinc sulfate 220 MG capsule Give 220 mg by tube every morning.          No Known Allergies  Past Medical History  Diagnosis Date  . Stroke     Hx of with right hemiparesis  . Hypertension   . Endocarditis, mitral valve, syphilitic   . Hypothyroidism   . Anorexia   . Dysphagia   . Reflux     Past Surgical History  Procedure Date  . Colostomy   . Peg placement   . Hernia repair     History  Smoking status  . Never Smoker   Smokeless tobacco  . Not on file    History  Alcohol Use No    No family history on file.  Reviw of Systems:  Reviewed in the HPI.  All other systems are negative.  Physical Exam: Blood pressure 110/70, pulse 70, height 6\' 2"  (1.88 m), weight 210 lb (95.255 kg), SpO2 97.00%. General: He is an ill-appearing gentleman  strapped to a gurney.  Head: Normocephalic, atraumatic, sclera non-icteric, mucus membranes are moist,   Neck: Supple. Negative for carotid bruits. JVD not elevated.  Lungs: Clear bilaterally to auscultation without wheezes, rales, or rhonchi. Breathing is unlabored.  Heart: RRR with S1 S2. No murmurs, rubs, or gallops appreciated.  Abdomen: Soft, non-tender, non-distended with normoactive bowel sounds. No hepatomegaly. No rebound/guarding. No obvious abdominal masses. He has a gastric feeding tube in place.  Msk:  His extremities are very rigid.  Extremities: No clubbing cyanosis or edema.  Neuro: His arms and legs were very rigid. He was able to move them but he did not have any fine motor control.  Psych:   Responds to questions appropriately with a normal affect.  ECG:  Assessment / Plan:

## 2011-05-22 NOTE — Patient Instructions (Addendum)
NEEDS AN APPOINTMENT TO HAVE HIS PACEMAKER INTERROGATED, UNKNOWN HISTORY OF TYPE. PLEASE CALL WIFE OR SON WITH INFORMATION.   Your physician recommends that you schedule a follow-up appointment in: AS NEEDED BASIS

## 2011-05-22 NOTE — Assessment & Plan Note (Signed)
The patient had a pacemaker placed at [facility]. I do not have any of the records. By the time I finished working with the patient, it was 5:30 PM and all of her EP nurses had left for the day. We'll have him scheduled to be seen in pacer clinic. I suspect that he'll need followup once a year.

## 2011-05-23 ENCOUNTER — Telehealth: Payer: Self-pay | Admitting: Cardiovascular Disease

## 2011-05-23 NOTE — Telephone Encounter (Signed)
New msg: pt [facility], calling wanting to know if pt needs to have transesophageal ECHO. Pt had appt with MD yesterday 05/22/11 and wanted to know what MD thinks. Please return call to discuss further.

## 2011-05-23 NOTE — Telephone Encounter (Signed)
Spoke with Carollee Herter the resident manager of [facility]. She said pt's PCP wanted to know if pt was free from the endocarditis because he was noncompliant with the treatment.On 05/22/11 O/V note MD wrote" Patient is not  having any recurrent symptoms of endocarditis nor recurrent fever and no signs or symptoms of CHF". MD does not recommends to repeat the TEE at this time. [facility] aware, she verbalized understanding.

## 2011-05-24 ENCOUNTER — Telehealth: Payer: Self-pay | Admitting: *Deleted

## 2011-05-24 ENCOUNTER — Telehealth: Payer: Self-pay | Admitting: Cardiovascular Disease

## 2011-05-24 DIAGNOSIS — Z950 Presence of cardiac pacemaker: Secondary | ICD-10-CM

## 2011-05-24 NOTE — Telephone Encounter (Signed)
CALLED WIFE TO SEE WHO WAS MONITORING PACEMAKER, SAID NO ONE SINCE MOVE. PT HAS HX OF ENDOCARDITIS. PT IN NURSING HOME. WE ARE TO CALL HER BACK WITH PACER APPOINTMENT.

## 2011-05-24 NOTE — Telephone Encounter (Signed)
New Problem   Patient wife [name] returning nurse message lft on her cell phone, she can be reached at hm# [phone]

## 2011-05-28 NOTE — Telephone Encounter (Signed)
Mr. Holtman has an appointment with Dr. Ladona Ridgel 06/18/11 at 3:30

## 2011-06-18 ENCOUNTER — Ambulatory Visit (INDEPENDENT_AMBULATORY_CARE_PROVIDER_SITE_OTHER): Payer: Medicare Other | Admitting: Internal Medicine

## 2011-06-18 ENCOUNTER — Encounter: Payer: Self-pay | Admitting: Internal Medicine

## 2011-06-18 ENCOUNTER — Encounter (HOSPITAL_BASED_OUTPATIENT_CLINIC_OR_DEPARTMENT_OTHER): Payer: Medicare Other

## 2011-06-18 ENCOUNTER — Ambulatory Visit (INDEPENDENT_AMBULATORY_CARE_PROVIDER_SITE_OTHER): Payer: Medicare Other | Admitting: *Deleted

## 2011-06-18 DIAGNOSIS — I495 Sick sinus syndrome: Secondary | ICD-10-CM

## 2011-06-18 DIAGNOSIS — I10 Essential (primary) hypertension: Secondary | ICD-10-CM

## 2011-06-18 DIAGNOSIS — Z950 Presence of cardiac pacemaker: Secondary | ICD-10-CM

## 2011-06-18 LAB — PACEMAKER DEVICE OBSERVATION
AL THRESHOLD: 1 V
BAMS-0001: 170 {beats}/min
RV LEAD AMPLITUDE: 6.1 mv
RV LEAD THRESHOLD: 0.5 V

## 2011-06-18 MED ORDER — LIOTHYRONINE SODIUM 5 MCG PO TABS: 5.0000 ug | ORAL_TABLET | Freq: Every day | ORAL | Status: DC

## 2011-06-18 NOTE — Progress Notes (Signed)
HPI Mr. [name] is referred today for ongoing evaluation and management of his permanent pacemaker. 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 He underwent back surgery at [facility] over a year ago with the procedure complicated by a stroke. He subsequently developed bradycardia and underwent permanent pacemaker insertion. He now is fed with a G tube and is unable to walk or use his right arm. He has chronic pain. He comes in today for additional evaluation and management. No Known Allergies   Current Outpatient Prescriptions  Medication Sig Dispense Refill  . traMADol (ULTRAM) 50 MG tablet Take 50 mg by mouth every 6 (six) hours as needed.      . traZODone (DESYREL) 25 mg TABS Take 25 mg by mouth at bedtime.      Marland Kitchen acetaminophen (TYLENOL) 160 MG/5ML elixir Give 640 mg by tube every 6 (six) hours as needed. For pain       . aspirin 325 MG tablet Give 325 mg by tube daily.        Marland Kitchen atropine 1 % ophthalmic solution Take 2 drops by mouth every 6 (six) hours.        . citalopram (CELEXA) 10 MG/5ML suspension Give 10 mg by tube daily.        . ferrous sulfate 300 (60 FE) MG/5ML syrup Give 300 mg by tube 2 (two) times daily.        . furosemide (LASIX) 20 MG tablet Give 20 mg by tube daily.        Marland Kitchen gabapentin (NEURONTIN) 300 MG capsule Give 300 mg by tube at bedtime.        . insulin glargine (LANTUS) 100 UNIT/ML injection Inject 10 Units into the skin at bedtime.        Marland Kitchen levothyroxine (SYNTHROID, LEVOTHROID) 150 MCG tablet Give 300 mcg by tube daily.        Marland Kitchen liothyronine (CYTOMEL) 5 MCG tablet Take 1 tablet (5 mcg total) by mouth daily.  30 tablet  11  . metFORMIN (GLUCOPHAGE) 500 MG tablet Give 500 mg by tube daily.        . metoprolol tartrate (LOPRESSOR) 25 MG tablet Give 25 mg by tube 2 (two) times daily.        . mirtazapine (REMERON) 15 MG tablet Give 15 mg by tube at bedtime.        . Multiple Vitamins-Minerals (MULTIVITAMINS THER.  W/MINERALS) TABS Give 1 tablet by tube daily.        . potassium chloride 20 MEQ/15ML (10%) solution Give 10 mEq by tube daily.        . pravastatin (PRAVACHOL) 40 MG tablet Give 40 mg by tube at bedtime.        . ranitidine (ZANTAC) 75 MG tablet Give 150 mg by tube 2 (two) times daily.        Marland Kitchen saccharomyces boulardii (FLORASTOR) 250 MG capsule Give 250 mg by tube 2 (two) times daily.        . sennosides-docusate sodium (SENOKOT-S) 8.6-50 MG tablet Give 2 tablets by tube 2 (two) times daily.        . Thiamine HCl (VITAMIN B-1) 100 MG tablet Give 100 mg by tube every morning.        . vitamin C (ASCORBIC ACID) 500 MG tablet Give 500 mg by tube 2 (two) times daily.           Past Medical History  Diagnosis Date  .  Stroke     Hx of with right hemiparesis  . Hypertension   . Endocarditis, mitral valve, syphilitic   . Hypothyroidism   . Anorexia   . Dysphagia   . Reflux   . Bacteremia   . Clostridium difficile colitis   . CVA (cerebral vascular accident)   . Pressure ulcer   . Diabetes mellitus, type 2   . Anemia   . Stroke     ROS:   All systems reviewed and negative except as noted in the HPI.   Past Surgical History  Procedure Date  . Colostomy   . Peg placement   . Hernia repair      Family History  Problem Relation Age of Onset  . Cancer Mother   . Other Father     old age after a fall  . Alzheimer's disease Sister      History   Social History  . Marital Status: Married    Spouse Name: N/A    Number of Children: N/A  . Years of Education: N/A   Occupational History  . Not on file.   Social History Main Topics  . Smoking status: Never Smoker   . Smokeless tobacco: Not on file  . Alcohol Use: No  . Drug Use: No  . Sexually Active: Not on file   Other Topics Concern  . Not on file   Social History Narrative  . No narrative on file     BP 120/76  Pulse 65  Ht 6\' 2"  (1.88 m)  Wt 99.338 kg (219 lb)  BMI 28.12 kg/m2  Physical Exam:  Ill  appearing stretcher bound NAD HEENT: Unremarkable Neck:  7 cm JVD, no thyromegally Lungs:  Clear with minimal rales. Well-healed pacemaker incision. HEART:  Regular rate rhythm, no murmurs, no rubs, no clicks Abd:  soft, positive bowel sounds, no organomegally, no rebound, no guarding Ext:  2 plus pulses, no edema, no cyanosis, no clubbing Skin:  No rashes no nodules Neuro:  Dense right hemiparesis  ECG normal sinus rhythm first degree AV block  DEVICE  Normal device function.  See PaceArt for details.   Assess/Plan:

## 2011-06-18 NOTE — Assessment & Plan Note (Signed)
His blood pressure today is well controlled. He will continue his current medical therapy.

## 2011-06-18 NOTE — Assessment & Plan Note (Signed)
His device is working normally. We'll plan to recheck in several months. 

## 2011-06-18 NOTE — Progress Notes (Signed)
Pacer check in clinic  

## 2011-06-18 NOTE — Patient Instructions (Signed)
Your physician wants you to follow-up in: 12 months with Dr. Taylor. You will receive a reminder letter in the mail two months in advance. If you don't receive a letter, please call our office to schedule the follow-up appointment.    

## 2011-06-25 ENCOUNTER — Encounter (HOSPITAL_BASED_OUTPATIENT_CLINIC_OR_DEPARTMENT_OTHER): Payer: Medicare Other | Attending: Internal Medicine

## 2011-06-26 NOTE — Progress Notes (Signed)
Addended by: Judithe Modest D on: 06/26/2011 04:09 PM   Modules accepted: Orders

## 2011-06-29 ENCOUNTER — Encounter (HOSPITAL_BASED_OUTPATIENT_CLINIC_OR_DEPARTMENT_OTHER): Payer: Medicare Other

## 2011-07-20 ENCOUNTER — Encounter (HOSPITAL_BASED_OUTPATIENT_CLINIC_OR_DEPARTMENT_OTHER): Payer: Medicare Other | Attending: Internal Medicine

## 2011-07-20 DIAGNOSIS — Z79899 Other long term (current) drug therapy: Secondary | ICD-10-CM | POA: Insufficient documentation

## 2011-07-20 DIAGNOSIS — Z794 Long term (current) use of insulin: Secondary | ICD-10-CM | POA: Insufficient documentation

## 2011-07-20 DIAGNOSIS — E119 Type 2 diabetes mellitus without complications: Secondary | ICD-10-CM | POA: Insufficient documentation

## 2011-07-20 DIAGNOSIS — E039 Hypothyroidism, unspecified: Secondary | ICD-10-CM | POA: Insufficient documentation

## 2011-07-20 DIAGNOSIS — K219 Gastro-esophageal reflux disease without esophagitis: Secondary | ICD-10-CM | POA: Insufficient documentation

## 2011-07-20 DIAGNOSIS — L8990 Pressure ulcer of unspecified site, unspecified stage: Secondary | ICD-10-CM | POA: Insufficient documentation

## 2011-07-20 DIAGNOSIS — L89109 Pressure ulcer of unspecified part of back, unspecified stage: Secondary | ICD-10-CM | POA: Insufficient documentation

## 2011-07-23 ENCOUNTER — Encounter (HOSPITAL_BASED_OUTPATIENT_CLINIC_OR_DEPARTMENT_OTHER): Payer: Medicare Other

## 2011-08-03 ENCOUNTER — Encounter (HOSPITAL_BASED_OUTPATIENT_CLINIC_OR_DEPARTMENT_OTHER): Payer: Medicare Other

## 2011-08-24 ENCOUNTER — Encounter (HOSPITAL_BASED_OUTPATIENT_CLINIC_OR_DEPARTMENT_OTHER): Payer: Medicare Other | Attending: General Surgery

## 2011-08-24 DIAGNOSIS — L89109 Pressure ulcer of unspecified part of back, unspecified stage: Secondary | ICD-10-CM | POA: Insufficient documentation

## 2011-08-24 DIAGNOSIS — L8994 Pressure ulcer of unspecified site, stage 4: Secondary | ICD-10-CM | POA: Insufficient documentation

## 2011-08-31 ENCOUNTER — Other Ambulatory Visit: Payer: Self-pay | Admitting: Internal Medicine

## 2011-08-31 DIAGNOSIS — I639 Cerebral infarction, unspecified: Secondary | ICD-10-CM

## 2011-09-04 ENCOUNTER — Ambulatory Visit (HOSPITAL_COMMUNITY)
Admission: RE | Admit: 2011-09-04 | Discharge: 2011-09-04 | Disposition: A | Discharge status: Home / Self Care | Payer: Medicare Other | Source: Ambulatory Visit | Attending: Internal Medicine | Admitting: Internal Medicine

## 2011-09-04 ENCOUNTER — Other Ambulatory Visit: Payer: Self-pay | Admitting: Internal Medicine

## 2011-09-04 DIAGNOSIS — I639 Cerebral infarction, unspecified: Secondary | ICD-10-CM

## 2011-09-04 DIAGNOSIS — Z434 Encounter for attention to other artificial openings of digestive tract: Secondary | ICD-10-CM | POA: Insufficient documentation

## 2011-09-04 MED ORDER — CHLORHEXIDINE GLUCONATE 4 % EX LIQD
CUTANEOUS | Status: AC
  Filled 2011-09-04: qty 45

## 2011-09-04 MED ORDER — IOHEXOL 300 MG/ML  SOLN
50.0000 mL | Freq: Once | INTRAMUSCULAR | Status: AC | PRN
  Administered 2011-09-04: 20 mL via ORAL

## 2011-09-04 NOTE — Procedures (Signed)
Successful exchange of GJ tube.  Tip in small bowel and ready to use.

## 2011-09-05 ENCOUNTER — Telehealth (HOSPITAL_COMMUNITY): Payer: Self-pay | Admitting: *Deleted

## 2011-09-20 ENCOUNTER — Encounter: Payer: Medicare Other | Admitting: *Deleted

## 2011-09-28 ENCOUNTER — Encounter (HOSPITAL_BASED_OUTPATIENT_CLINIC_OR_DEPARTMENT_OTHER): Payer: Medicare Other | Attending: General Surgery

## 2011-09-28 DIAGNOSIS — L8994 Pressure ulcer of unspecified site, stage 4: Secondary | ICD-10-CM | POA: Insufficient documentation

## 2011-09-28 DIAGNOSIS — L89109 Pressure ulcer of unspecified part of back, unspecified stage: Secondary | ICD-10-CM | POA: Insufficient documentation

## 2011-10-01 ENCOUNTER — Encounter: Payer: Self-pay | Admitting: *Deleted

## 2011-10-10 ENCOUNTER — Telehealth: Payer: Self-pay | Admitting: Cardiovascular Disease

## 2011-10-10 NOTE — Telephone Encounter (Signed)
Spoke with nurse @ [facility] they will send the missed transmission in the am.

## 2011-10-10 NOTE — Telephone Encounter (Signed)
New msg Nursing home calling to see if do remote transmission today or tomorrow

## 2011-10-11 ENCOUNTER — Encounter: Payer: Self-pay | Admitting: Internal Medicine

## 2011-10-11 ENCOUNTER — Ambulatory Visit (INDEPENDENT_AMBULATORY_CARE_PROVIDER_SITE_OTHER): Payer: Medicare Other | Admitting: *Deleted

## 2011-10-11 DIAGNOSIS — I495 Sick sinus syndrome: Secondary | ICD-10-CM

## 2011-10-11 DIAGNOSIS — Z950 Presence of cardiac pacemaker: Secondary | ICD-10-CM

## 2011-10-15 LAB — REMOTE PACEMAKER DEVICE
AL AMPLITUDE: 4.6138 mv
AL IMPEDENCE PM: 464 Ohm
BATTERY VOLTAGE: 3.02 V
RV LEAD AMPLITUDE: 6.1402 mv
RV LEAD IMPEDENCE PM: 416 Ohm

## 2011-11-09 ENCOUNTER — Encounter: Payer: Self-pay | Admitting: *Deleted

## 2011-11-16 ENCOUNTER — Encounter (HOSPITAL_BASED_OUTPATIENT_CLINIC_OR_DEPARTMENT_OTHER): Payer: Medicare Other | Attending: General Surgery

## 2011-11-16 DIAGNOSIS — L8994 Pressure ulcer of unspecified site, stage 4: Secondary | ICD-10-CM | POA: Insufficient documentation

## 2011-11-16 DIAGNOSIS — L89109 Pressure ulcer of unspecified part of back, unspecified stage: Secondary | ICD-10-CM | POA: Insufficient documentation

## 2011-12-21 ENCOUNTER — Encounter (HOSPITAL_BASED_OUTPATIENT_CLINIC_OR_DEPARTMENT_OTHER): Payer: Medicare Other | Attending: General Surgery

## 2011-12-21 DIAGNOSIS — G8220 Paraplegia, unspecified: Secondary | ICD-10-CM | POA: Insufficient documentation

## 2011-12-21 DIAGNOSIS — L8994 Pressure ulcer of unspecified site, stage 4: Secondary | ICD-10-CM | POA: Insufficient documentation

## 2011-12-21 DIAGNOSIS — L89109 Pressure ulcer of unspecified part of back, unspecified stage: Secondary | ICD-10-CM | POA: Insufficient documentation

## 2012-01-14 ENCOUNTER — Encounter: Payer: Medicare Other | Admitting: *Deleted

## 2012-01-18 ENCOUNTER — Encounter: Payer: Self-pay | Admitting: *Deleted

## 2012-02-05 ENCOUNTER — Other Ambulatory Visit (HOSPITAL_COMMUNITY): Payer: Self-pay | Admitting: Internal Medicine

## 2012-02-05 ENCOUNTER — Ambulatory Visit (HOSPITAL_COMMUNITY)
Admission: RE | Admit: 2012-02-05 | Discharge: 2012-02-05 | Disposition: A | Discharge status: Home / Self Care | Payer: Medicare Other | Source: Ambulatory Visit | Attending: Internal Medicine | Admitting: Internal Medicine

## 2012-02-05 DIAGNOSIS — Z434 Encounter for attention to other artificial openings of digestive tract: Secondary | ICD-10-CM | POA: Insufficient documentation

## 2012-02-05 DIAGNOSIS — R633 Feeding difficulties: Secondary | ICD-10-CM

## 2012-02-05 MED ORDER — CHLORHEXIDINE GLUCONATE 4 % EX LIQD
CUTANEOUS | Status: AC
  Filled 2012-02-05: qty 45

## 2012-02-05 MED ORDER — IOHEXOL 300 MG/ML  SOLN
100.0000 mL | Freq: Once | INTRAMUSCULAR | Status: AC | PRN
  Administered 2012-02-05: 50 mL via INTRAVENOUS

## 2012-02-05 NOTE — Procedures (Signed)
Successful fluoroscopic guided replacement of gastrojejunostomy tube.  No immediate post procedural complications.  The feeding tube is ready for immediate use. 

## 2012-02-08 ENCOUNTER — Encounter (HOSPITAL_BASED_OUTPATIENT_CLINIC_OR_DEPARTMENT_OTHER): Payer: Medicare Other | Attending: General Surgery

## 2012-02-08 DIAGNOSIS — L89309 Pressure ulcer of unspecified buttock, unspecified stage: Secondary | ICD-10-CM | POA: Insufficient documentation

## 2012-02-08 DIAGNOSIS — L8993 Pressure ulcer of unspecified site, stage 3: Secondary | ICD-10-CM | POA: Insufficient documentation

## 2012-02-12 ENCOUNTER — Emergency Department (HOSPITAL_COMMUNITY): Payer: Medicare Other

## 2012-02-12 ENCOUNTER — Encounter (HOSPITAL_COMMUNITY): Payer: Self-pay | Admitting: Emergency Medicine

## 2012-02-12 ENCOUNTER — Inpatient Hospital Stay (HOSPITAL_COMMUNITY)
Admission: EM | Admit: 2012-02-12 | Discharge: 2012-02-16 | DRG: 871 | Disposition: A | Discharge status: Skilled Nursing Facility | Payer: Medicare Other | Attending: Family Medicine | Admitting: Family Medicine

## 2012-02-12 DIAGNOSIS — R4182 Altered mental status, unspecified: Secondary | ICD-10-CM | POA: Diagnosis present

## 2012-02-12 DIAGNOSIS — R799 Abnormal finding of blood chemistry, unspecified: Secondary | ICD-10-CM

## 2012-02-12 DIAGNOSIS — D72829 Elevated white blood cell count, unspecified: Secondary | ICD-10-CM | POA: Diagnosis present

## 2012-02-12 DIAGNOSIS — J690 Pneumonitis due to inhalation of food and vomit: Secondary | ICD-10-CM | POA: Diagnosis present

## 2012-02-12 DIAGNOSIS — Z7982 Long term (current) use of aspirin: Secondary | ICD-10-CM

## 2012-02-12 DIAGNOSIS — M549 Dorsalgia, unspecified: Secondary | ICD-10-CM

## 2012-02-12 DIAGNOSIS — G929 Unspecified toxic encephalopathy: Secondary | ICD-10-CM | POA: Diagnosis present

## 2012-02-12 DIAGNOSIS — E860 Dehydration: Secondary | ICD-10-CM

## 2012-02-12 DIAGNOSIS — E872 Acidosis, unspecified: Secondary | ICD-10-CM | POA: Diagnosis present

## 2012-02-12 DIAGNOSIS — E039 Hypothyroidism, unspecified: Secondary | ICD-10-CM | POA: Diagnosis present

## 2012-02-12 DIAGNOSIS — L89154 Pressure ulcer of sacral region, stage 4: Secondary | ICD-10-CM | POA: Diagnosis present

## 2012-02-12 DIAGNOSIS — R000 Tachycardia, unspecified: Secondary | ICD-10-CM | POA: Diagnosis present

## 2012-02-12 DIAGNOSIS — E119 Type 2 diabetes mellitus without complications: Secondary | ICD-10-CM | POA: Diagnosis present

## 2012-02-12 DIAGNOSIS — E1351 Other specified diabetes mellitus with diabetic peripheral angiopathy without gangrene: Secondary | ICD-10-CM | POA: Diagnosis present

## 2012-02-12 DIAGNOSIS — N390 Urinary tract infection, site not specified: Secondary | ICD-10-CM

## 2012-02-12 DIAGNOSIS — G92 Toxic encephalopathy: Secondary | ICD-10-CM | POA: Diagnosis present

## 2012-02-12 DIAGNOSIS — N179 Acute kidney failure, unspecified: Secondary | ICD-10-CM | POA: Diagnosis present

## 2012-02-12 DIAGNOSIS — I69991 Dysphagia following unspecified cerebrovascular disease: Secondary | ICD-10-CM

## 2012-02-12 DIAGNOSIS — I69391 Dysphagia following cerebral infarction: Secondary | ICD-10-CM

## 2012-02-12 DIAGNOSIS — R7989 Other specified abnormal findings of blood chemistry: Secondary | ICD-10-CM

## 2012-02-12 DIAGNOSIS — R739 Hyperglycemia, unspecified: Secondary | ICD-10-CM

## 2012-02-12 DIAGNOSIS — F3289 Other specified depressive episodes: Secondary | ICD-10-CM | POA: Diagnosis present

## 2012-02-12 DIAGNOSIS — F329 Major depressive disorder, single episode, unspecified: Secondary | ICD-10-CM | POA: Diagnosis present

## 2012-02-12 DIAGNOSIS — Z66 Do not resuscitate: Secondary | ICD-10-CM | POA: Diagnosis present

## 2012-02-12 DIAGNOSIS — A419 Sepsis, unspecified organism: Principal | ICD-10-CM | POA: Diagnosis present

## 2012-02-12 DIAGNOSIS — L8994 Pressure ulcer of unspecified site, stage 4: Secondary | ICD-10-CM | POA: Diagnosis present

## 2012-02-12 DIAGNOSIS — R410 Disorientation, unspecified: Secondary | ICD-10-CM

## 2012-02-12 DIAGNOSIS — I509 Heart failure, unspecified: Secondary | ICD-10-CM | POA: Diagnosis present

## 2012-02-12 DIAGNOSIS — L89109 Pressure ulcer of unspecified part of back, unspecified stage: Secondary | ICD-10-CM | POA: Diagnosis present

## 2012-02-12 LAB — URINALYSIS, ROUTINE W REFLEX MICROSCOPIC
Bilirubin Urine: NEGATIVE
Glucose, UA: NEGATIVE mg/dL
Ketones, ur: NEGATIVE mg/dL
Nitrite: NEGATIVE
Protein, ur: 30 mg/dL — AB
Specific Gravity, Urine: 1.021 (ref 1.005–1.030)
Urobilinogen, UA: 0.2 mg/dL (ref 0.0–1.0)
pH: 8 (ref 5.0–8.0)

## 2012-02-12 LAB — URINE MICROSCOPIC-ADD ON

## 2012-02-12 LAB — BASIC METABOLIC PANEL
BUN: 58 mg/dL — ABNORMAL HIGH (ref 6–23)
CO2: 23 mEq/L (ref 19–32)
Calcium: 10.8 mg/dL — ABNORMAL HIGH (ref 8.4–10.5)
Chloride: 98 mEq/L (ref 96–112)
Creatinine, Ser: 1.21 mg/dL (ref 0.50–1.35)
GFR calc Af Amer: 66 mL/min — ABNORMAL LOW (ref >90)
GFR calc non Af Amer: 57 mL/min — ABNORMAL LOW (ref >90)
Glucose, Bld: 306 mg/dL — ABNORMAL HIGH (ref 70–99)
Potassium: 4.7 mEq/L (ref 3.5–5.1)
Sodium: 140 mEq/L (ref 135–145)

## 2012-02-12 LAB — LACTIC ACID, PLASMA: Lactic Acid, Venous: 8.8 mmol/L — ABNORMAL HIGH (ref 0.5–2.2)

## 2012-02-12 LAB — CBC WITH DIFFERENTIAL/PLATELET
Basophils Absolute: 0.1 10*3/uL (ref 0.0–0.1)
Basophils Relative: 0 % (ref 0–1)
Eosinophils Absolute: 0.1 10*3/uL (ref 0.0–0.7)
Eosinophils Relative: 1 % (ref 0–5)
HCT: 34.8 % — ABNORMAL LOW (ref 39.0–52.0)
Hemoglobin: 11 g/dL — ABNORMAL LOW (ref 13.0–17.0)
Lymphocytes Relative: 20 % (ref 12–46)
Lymphs Abs: 2.4 10*3/uL (ref 0.7–4.0)
MCH: 24.7 pg — ABNORMAL LOW (ref 26.0–34.0)
MCHC: 31.6 g/dL (ref 30.0–36.0)
MCV: 78.2 fL (ref 78.0–100.0)
Monocytes Absolute: 1 10*3/uL (ref 0.1–1.0)
Monocytes Relative: 8 % (ref 3–12)
Neutro Abs: 8.8 10*3/uL — ABNORMAL HIGH (ref 1.7–7.7)
Neutrophils Relative %: 71 % (ref 43–77)
Platelets: 379 10*3/uL (ref 150–400)
RBC: 4.45 MIL/uL (ref 4.22–5.81)
RDW: 16.5 % — ABNORMAL HIGH (ref 11.5–15.5)
WBC: 12.4 10*3/uL — ABNORMAL HIGH (ref 4.0–10.5)

## 2012-02-12 LAB — GLUCOSE, CAPILLARY: Glucose-Capillary: 236 mg/dL — ABNORMAL HIGH (ref 70–99)

## 2012-02-12 LAB — PROCALCITONIN: Procalcitonin: 0.16 ng/mL

## 2012-02-12 MED ORDER — VANCOMYCIN HCL IN DEXTROSE 1-5 GM/200ML-% IV SOLN
1000.0000 mg | INTRAVENOUS | Status: DC
  Filled 2012-02-12: qty 200

## 2012-02-12 MED ORDER — IOHEXOL 300 MG/ML  SOLN
100.0000 mL | Freq: Once | INTRAMUSCULAR | Status: AC | PRN
  Administered 2012-02-12: 100 mL via INTRAVENOUS

## 2012-02-12 MED ORDER — SACCHAROMYCES BOULARDII 250 MG PO CAPS
250.0000 mg | ORAL_CAPSULE | Freq: Two times a day (BID) | ORAL | Status: DC
  Administered 2012-02-13 – 2012-02-16 (×6): 250 mg via ORAL
  Filled 2012-02-12 (×8): qty 1

## 2012-02-12 MED ORDER — SODIUM CHLORIDE 0.9 % IV BOLUS (SEPSIS)
1000.0000 mL | Freq: Once | INTRAVENOUS | Status: AC
  Administered 2012-02-13: 1000 mL via INTRAVENOUS

## 2012-02-12 MED ORDER — ACETAMINOPHEN 160 MG/5ML PO ELIX: 640.0000 mg | ORAL_SOLUTION | Freq: Four times a day (QID) | ORAL | Status: DC | PRN

## 2012-02-12 MED ORDER — SENNA-DOCUSATE SODIUM 8.6-50 MG PO TABS: 2.0000 | ORAL_TABLET | Freq: Two times a day (BID) | ORAL | Status: DC

## 2012-02-12 MED ORDER — ASPIRIN 325 MG PO TABS
325.0000 mg | ORAL_TABLET | Freq: Every day | ORAL | Status: DC
  Administered 2012-02-13 – 2012-02-16 (×4): 325 mg via ORAL
  Filled 2012-02-12 (×4): qty 1

## 2012-02-12 MED ORDER — VITAMIN B-1 100 MG PO TABS
100.0000 mg | ORAL_TABLET | ORAL | Status: DC
  Administered 2012-02-14 – 2012-02-16 (×3): 100 mg
  Filled 2012-02-12 (×5): qty 1

## 2012-02-12 MED ORDER — FAMOTIDINE IN NACL 20-0.9 MG/50ML-% IV SOLN
20.0000 mg | Freq: Two times a day (BID) | INTRAVENOUS | Status: DC
  Administered 2012-02-13 – 2012-02-16 (×7): 20 mg via INTRAVENOUS
  Filled 2012-02-12 (×9): qty 50

## 2012-02-12 MED ORDER — ACETAMINOPHEN 160 MG/5ML PO SOLN
640.0000 mg | Freq: Four times a day (QID) | ORAL | Status: DC | PRN
  Filled 2012-02-12: qty 20

## 2012-02-12 MED ORDER — DOCUSATE SODIUM 50 MG/5ML PO LIQD
100.0000 mg | Freq: Two times a day (BID) | ORAL | Status: DC
  Administered 2012-02-13 – 2012-02-16 (×6): 100 mg via ORAL
  Filled 2012-02-12 (×8): qty 10

## 2012-02-12 MED ORDER — TRAZODONE 25 MG HALF TABLET
25.0000 mg | ORAL_TABLET | Freq: Every day | ORAL | Status: DC
  Administered 2012-02-13 – 2012-02-15 (×3): 25 mg via ORAL
  Filled 2012-02-12 (×4): qty 1

## 2012-02-12 MED ORDER — LEVOFLOXACIN IN D5W 750 MG/150ML IV SOLN
750.0000 mg | INTRAVENOUS | Status: DC
  Administered 2012-02-12: 750 mg via INTRAVENOUS
  Filled 2012-02-12 (×3): qty 150

## 2012-02-12 MED ORDER — SODIUM CHLORIDE 0.9 % IJ SOLN
3.0000 mL | Freq: Two times a day (BID) | INTRAMUSCULAR | Status: DC
  Administered 2012-02-14: 3 mL via INTRAVENOUS

## 2012-02-12 MED ORDER — SODIUM CHLORIDE 0.9 % IV BOLUS (SEPSIS)
1000.0000 mL | Freq: Once | INTRAVENOUS | Status: AC
  Administered 2012-02-12: 1000 mL via INTRAVENOUS

## 2012-02-12 MED ORDER — ATROPINE SULFATE 1 % OP SOLN
2.0000 [drp] | Freq: Four times a day (QID) | OPHTHALMIC | Status: DC
  Administered 2012-02-14 – 2012-02-16 (×4): 2 [drp] via SUBLINGUAL
  Filled 2012-02-12 (×4): qty 2

## 2012-02-12 MED ORDER — PIPERACILLIN-TAZOBACTAM 3.375 G IVPB
3.3750 g | Freq: Once | INTRAVENOUS | Status: AC
  Administered 2012-02-12: 3.375 g via INTRAVENOUS
  Filled 2012-02-12: qty 50

## 2012-02-12 MED ORDER — CITALOPRAM HYDROBROMIDE 10 MG/5ML PO SOLN
10.0000 mg | Freq: Every day | ORAL | Status: DC
  Administered 2012-02-13 – 2012-02-16 (×4): 10 mg
  Filled 2012-02-12 (×4): qty 10

## 2012-02-12 MED ORDER — INSULIN ASPART 100 UNIT/ML ~~LOC~~ SOLN
0.0000 [IU] | SUBCUTANEOUS | Status: DC
  Administered 2012-02-13 (×2): 1 [IU] via SUBCUTANEOUS
  Administered 2012-02-13: 3 [IU] via SUBCUTANEOUS
  Administered 2012-02-13 – 2012-02-14 (×3): 2 [IU] via SUBCUTANEOUS
  Administered 2012-02-15: 1 [IU] via SUBCUTANEOUS

## 2012-02-12 MED ORDER — LIOTHYRONINE SODIUM 25 MCG PO TABS
25.0000 ug | ORAL_TABLET | Freq: Every day | ORAL | Status: DC
  Administered 2012-02-14 – 2012-02-16 (×3): 25 ug
  Filled 2012-02-12 (×5): qty 1

## 2012-02-12 MED ORDER — GABAPENTIN 300 MG PO CAPS: 300.0000 mg | ORAL_CAPSULE | Freq: Every day | ORAL | Status: DC

## 2012-02-12 MED ORDER — SENNOSIDES 8.8 MG/5ML PO SYRP
10.0000 mL | ORAL_SOLUTION | Freq: Two times a day (BID) | ORAL | Status: DC
  Administered 2012-02-13 – 2012-02-16 (×5): 10 mL via ORAL
  Filled 2012-02-12 (×8): qty 10

## 2012-02-12 MED ORDER — VANCOMYCIN HCL IN DEXTROSE 1-5 GM/200ML-% IV SOLN: 1000.0000 mg | Freq: Once | INTRAVENOUS | Status: DC

## 2012-02-12 MED ORDER — PROMETHAZINE HCL 25 MG PO TABS: 25.0000 mg | ORAL_TABLET | Freq: Four times a day (QID) | ORAL | Status: DC | PRN

## 2012-02-12 MED ORDER — GABAPENTIN 250 MG/5ML PO SOLN
300.0000 mg | Freq: Every day | ORAL | Status: DC
  Administered 2012-02-13 – 2012-02-15 (×3): 300 mg
  Filled 2012-02-12 (×5): qty 6

## 2012-02-12 MED ORDER — FERROUS SULFATE 300 (60 FE) MG/5ML PO SYRP
300.0000 mg | ORAL_SOLUTION | Freq: Two times a day (BID) | ORAL | Status: DC
  Administered 2012-02-13 – 2012-02-16 (×7): 300 mg
  Filled 2012-02-12 (×8): qty 5

## 2012-02-12 MED ORDER — THERA M PLUS PO TABS: 1.0000 | ORAL_TABLET | Freq: Every day | ORAL | Status: DC

## 2012-02-12 MED ORDER — VITAMIN C 500 MG PO TABS
500.0000 mg | ORAL_TABLET | Freq: Two times a day (BID) | ORAL | Status: DC
  Administered 2012-02-13 – 2012-02-16 (×7): 500 mg
  Filled 2012-02-12 (×8): qty 1

## 2012-02-12 MED ORDER — SODIUM CHLORIDE 0.9 % IV SOLN
INTRAVENOUS | Status: DC
  Administered 2012-02-13 – 2012-02-15 (×4): via INTRAVENOUS

## 2012-02-12 MED ORDER — HEPARIN SODIUM (PORCINE) 5000 UNIT/ML IJ SOLN
5000.0000 [IU] | Freq: Three times a day (TID) | INTRAMUSCULAR | Status: DC
  Administered 2012-02-13 – 2012-02-16 (×10): 5000 [IU] via SUBCUTANEOUS
  Filled 2012-02-12 (×13): qty 1

## 2012-02-12 MED ORDER — PIPERACILLIN-TAZOBACTAM 3.375 G IVPB: 3.3750 g | Freq: Four times a day (QID) | INTRAVENOUS | Status: DC

## 2012-02-12 MED ORDER — METOPROLOL TARTRATE 25 MG PO TABS: 25.0000 mg | ORAL_TABLET | Freq: Two times a day (BID) | ORAL | Status: DC

## 2012-02-12 MED ORDER — METOPROLOL TARTRATE 25 MG/10 ML ORAL SUSPENSION
25.0000 mg | Freq: Two times a day (BID) | ORAL | Status: DC
  Filled 2012-02-12: qty 10

## 2012-02-12 MED ORDER — LEVOTHYROXINE SODIUM 150 MCG PO TABS
300.0000 ug | ORAL_TABLET | Freq: Every day | ORAL | Status: DC
  Administered 2012-02-14 – 2012-02-16 (×3): 300 ug
  Filled 2012-02-12 (×5): qty 2

## 2012-02-12 MED ORDER — SIMVASTATIN 40 MG PO TABS
40.0000 mg | ORAL_TABLET | Freq: Every day | ORAL | Status: DC
  Filled 2012-02-12: qty 1

## 2012-02-12 MED ORDER — ADULT MULTIVITAMIN LIQUID CH
5.0000 mL | Freq: Every day | ORAL | Status: DC
  Administered 2012-02-13 – 2012-02-16 (×4): 5 mL
  Filled 2012-02-12 (×4): qty 5

## 2012-02-12 MED ORDER — PIPERACILLIN-TAZOBACTAM 3.375 G IVPB
3.3750 g | Freq: Three times a day (TID) | INTRAVENOUS | Status: DC
  Administered 2012-02-13 – 2012-02-15 (×8): 3.375 g via INTRAVENOUS
  Filled 2012-02-12 (×10): qty 50

## 2012-02-12 NOTE — ED Notes (Signed)
Pt brought in by [facility] from [facility] nursing home.  Per EMS pt has had productive cough for 3 days, [employer] staff concerned pt possibly aspirated mucus.  Currently being treated for a UTI, foley present.  Pt CBG 261, per staff pt has been refusing insulin.  Decubitus ulcer present on sacrum during assessment, Dr. Juleen China placed wet to dry dressing.

## 2012-02-12 NOTE — Progress Notes (Signed)
Paged to patient room by unit nurse.  Patient is 76  Year old male brought in from nursing home with history of prior stroke.  Family had been informed that patient's prognosis was not good.  Met with family members - wife, son and grandchildren.  Family asked what they needed and they requested prayer.  Family then asked for time alone with the patient.

## 2012-02-12 NOTE — ED Notes (Signed)
CT made aware that pt has IV and is ready for CT

## 2012-02-12 NOTE — ED Notes (Signed)
Contacted [employer], given report on patient, instructed to call back with time of death of appropriate.  Referral number - [phone].

## 2012-02-12 NOTE — ED Notes (Signed)
Patient transported to CT 

## 2012-02-12 NOTE — Progress Notes (Signed)
ANTIBIOTIC CONSULT NOTE - INITIAL  Pharmacy Consult for vancomycin + zosyn Indication: rule out sepsis  No Known Allergies  Patient Measurements:   Adjusted Body Weight:   Vital Signs: Temp: 98.8 F (37.1 C) (10/29 1614) Temp src: Rectal (10/29 1614) BP: 132/74 mmHg (10/29 1855) Pulse Rate: 85  (10/29 1855) Intake/Output from previous day:   Intake/Output from this shift:    Labs:  Basename 02/12/12 1638  WBC 12.4*  HGB 11.0*  PLT 379  LABCREA --  CREATININE 1.21   The CrCl is unknown because both a height and weight (above a minimum accepted value) are required for this calculation. No results found for this basename: VANCOTROUGH:2,VANCOPEAK:2,VANCORANDOM:2,GENTTROUGH:2,GENTPEAK:2,GENTRANDOM:2,TOBRATROUGH:2,TOBRAPEAK:2,TOBRARND:2,AMIKACINPEAK:2,AMIKACINTROU:2,AMIKACIN:2, in the last 72 hours   Microbiology: No results found for this or any previous visit (from the past 720 hour(s)).  Medical History: Past Medical History  Diagnosis Date  . Stroke     Hx of with right hemiparesis  . Hypertension   . Endocarditis, mitral valve, syphilitic   . Hypothyroidism   . Anorexia   . Dysphagia   . Reflux   . Bacteremia   . Clostridium difficile colitis   . CVA (cerebral vascular accident)   . Pressure ulcer(707.0)   . Diabetes mellitus, type 2   . Anemia   . Stroke     Medications:  Anti-infectives     Start     Dose/Rate Route Frequency Ordered Stop   02/13/12 0000   piperacillin-tazobactam (ZOSYN) IVPB 3.375 g  Status:  Discontinued        3.375 g 12.5 mL/hr over 240 Minutes Intravenous 4 times per day 02/12/12 2150 02/12/12 2203   02/12/12 1745   levofloxacin (LEVAQUIN) IVPB 750 mg        750 mg 100 mL/hr over 90 Minutes Intravenous Every 24 hours 02/12/12 1739     02/12/12 1730  piperacillin-tazobactam (ZOSYN) IVPB 3.375 g       3.375 g 12.5 mL/hr over 240 Minutes Intravenous  Once 02/12/12 1727     02/12/12 1730   vancomycin (VANCOCIN) IVPB 1000  mg/200 mL premix        1,000 mg 200 mL/hr over 60 Minutes Intravenous  Once 02/12/12 1727           Assessment: 76 yom presented to the ED with AMS. He was recently treated with ceftriaxone for a klebsiella + enterococcus (CTX wouldn't cover) UTI. Pt is currently afebrile and WBC is slightly elevated at 12.4. First doses of vanc + zosyn + levaquin ordered.  Vanc 10/29>> Levaquin 10/29>> Zosyn 10/29>>  Goal of Therapy:  Vancomycin trough level 15-20 mcg/ml  Plan:  1. Continue levaquin 750mg  IV Q24H as ordered by MD 2. Change zosyn to 3.375gm IV Q8H (4 hr infusion) 3. Vanc 1gm IV Q24H 4. F/u renal fxn, C&S, clinical status and trough at [facility]  Kandise Riehle, Drake Leach 02/12/2012,10:08 PM

## 2012-02-12 NOTE — ED Notes (Signed)
Chaplin in to visit with pt family.

## 2012-02-12 NOTE — ED Notes (Signed)
Paged IV team 

## 2012-02-12 NOTE — H&P (Signed)
[facility] History and Physical  [name] FAO:[id number] DOB: 13-Feb-1936 DOA: 02/12/2012  Referring physician: ED PCP: No primary provider on file.  Specialists: CCM has evaluated patient at bedside  Chief Complaint: AMS, lactic acidosis  HPI: [name] is a 76 y.o. male seen in the ED for AMS and possible aspiration.  Has had CVA in past with dysphagia and R sided hemiparesis but apparently was normally talkative up until earlier today after EMS brought him in, he has been essentially non-verbal and not really able to provide a good history.  It is worth noting that he had urine cultures drawn 10/21 which grew out a Klebsellia and enterococcus, was started on rocephin which these were sensitive to.  Unfortunatly in the ED his lactic acid was measured to be as high as 8.8, he had a mild leukocytosis of 12.  The patient is DNR/DNI and this status is confirmed with the family who are at bedside, hospitalist service has been asked to admit the patient.  Review of Systems: ROS cannot be completed at this time due to patients AMS.  Past Medical History  Diagnosis Date  . Stroke     Hx of with right hemiparesis  . Hypertension   . Endocarditis, mitral valve, syphilitic   . Hypothyroidism   . Anorexia   . Dysphagia   . Reflux   . Bacteremia   . Clostridium difficile colitis   . CVA (cerebral vascular accident)   . Pressure ulcer(707.0)   . Diabetes mellitus, type 2   . Anemia   . Stroke    Past Surgical History  Procedure Date  . Colostomy   . Peg placement   . Hernia repair    Social History:  reports that he has never smoked. He does not have any smokeless tobacco history on file. He reports that he does not drink alcohol or use illicit drugs. Patient lives in nursing home, does talk and is understandable at baseline.  No Known Allergies  Family History  Problem Relation Age of Onset  . Cancer Mother   . Other Father     old age after a fall  .  Alzheimer's disease Sister     Prior to Admission medications   Medication Sig Start Date End Date Taking? Authorizing Provider  acetaminophen (TYLENOL) 160 MG/5ML elixir Give 640 mg by tube every 6 (six) hours as needed. For pain    Yes Historical Provider, MD  aspirin 325 MG tablet Give 325 mg by tube daily.     Yes Historical Provider, MD  atropine 1 % ophthalmic solution Take 2 drops by mouth every 6 (six) hours.     Yes Historical Provider, MD  citalopram (CELEXA) 10 MG/5ML suspension Give 10 mg by tube daily.     Yes Historical Provider, MD  ferrous sulfate 300 (60 FE) MG/5ML syrup Give 300 mg by tube 2 (two) times daily.     Yes Historical Provider, MD  furosemide (LASIX) 20 MG tablet Give 20 mg by tube daily.     Yes Historical Provider, MD  gabapentin (NEURONTIN) 300 MG capsule Give 300 mg by tube at bedtime.     Yes Historical Provider, MD  insulin glargine (LANTUS) 100 UNIT/ML injection Inject 10 Units into the skin at bedtime.     Yes Historical Provider, MD  levothyroxine (SYNTHROID, LEVOTHROID) 150 MCG tablet Give 300 mcg by tube at bedtime.    Yes Historical Provider, MD  liothyronine (CYTOMEL) 25 MCG tablet Give  25 mcg by tube daily.   Yes Historical Provider, MD  metFORMIN (GLUCOPHAGE) 500 MG tablet Give 500 mg by tube daily.     Yes Historical Provider, MD  metoprolol tartrate (LOPRESSOR) 25 MG tablet Give 25 mg by tube 2 (two) times daily.     Yes Historical Provider, MD  mirtazapine (REMERON) 15 MG tablet Give 15 mg by tube at bedtime.     Yes Historical Provider, MD  Multiple Vitamins-Minerals (MULTIVITAMINS THER. W/MINERALS) TABS Give 1 tablet by tube daily.     Yes Historical Provider, MD  potassium chloride 20 MEQ/15ML (10%) solution Give 10 mEq by tube daily.     Yes Historical Provider, MD  pravastatin (PRAVACHOL) 40 MG tablet Give 40 mg by tube at bedtime.     Yes Historical Provider, MD  promethazine (PHENERGAN) 25 MG tablet Give 25 mg by tube every 6 (six) hours as  needed. For nausea   Yes Historical Provider, MD  ranitidine (ZANTAC) 75 MG tablet Give 150 mg by tube 2 (two) times daily.     Yes Historical Provider, MD  saccharomyces boulardii (FLORASTOR) 250 MG capsule Give 250 mg by tube 2 (two) times daily.     Yes Historical Provider, MD  sennosides-docusate sodium (SENOKOT-S) 8.6-50 MG tablet Give 2 tablets by tube 2 (two) times daily.     Yes Historical Provider, MD  Thiamine HCl (VITAMIN B-1) 100 MG tablet Give 100 mg by tube every morning.     Yes Historical Provider, MD  traMADol (ULTRAM) 50 MG tablet Give 50 mg by tube every 6 (six) hours as needed.    Yes Historical Provider, MD  traZODone (DESYREL) 25 mg TABS Take 25 mg by mouth at bedtime.   Yes Historical Provider, MD  vitamin C (ASCORBIC ACID) 500 MG tablet Give 500 mg by tube 2 (two) times daily.     Yes Historical Provider, MD   Physical Exam: Filed Vitals:   02/12/12 1614 02/12/12 1622 02/12/12 1804 02/12/12 1855  BP:    132/74  Pulse:    85  Temp: 98.8 F (37.1 C)     TempSrc: Rectal     Resp:    26  SpO2:  98% 93% 100%    General:  Ill and toxic appearing patient who is tachypnic Eyes: PEERLA ENT: mucous membranes dry Neck: supple w/o JVD Cardiovascular: RRR w/o MRG Respiratory: CTA B, tachypnic Abdomen: guarding is present, generalized tenderness is present, a colostomy is present with pink looking bowel visible, a PEG tube is present Skin: no rash nor lesion Musculoskeletal: MAE, full ROM all 4 extremities Psychiatric: unable to assess due to AMS Neurologic: AMS, not mentation, not able to participate in neuro exam,  blinking eyes, but when asked questions able to answer yes or no to those questions (although his answers dont seem to be correct all the time)  Labs on Admission:  Basic Metabolic Panel:  Lab 02/12/12 9562  NA 140  K 4.7  CL 98  CO2 23  GLUCOSE 306*  BUN 58*  CREATININE 1.21  CALCIUM 10.8*  MG --  PHOS --   Liver Function Tests: No results  found for this basename: AST:5,ALT:5,ALKPHOS:5,BILITOT:5,PROT:5,ALBUMIN:5 in the last 168 hours No results found for this basename: LIPASE:5,AMYLASE:5 in the last 168 hours No results found for this basename: AMMONIA:5 in the last 168 hours CBC:  Lab 02/12/12 1638  WBC 12.4*  NEUTROABS 8.8*  HGB 11.0*  HCT 34.8*  MCV 78.2  PLT 379  Cardiac Enzymes: No results found for this basename: CKTOTAL:5,CKMB:5,CKMBINDEX:5,TROPONINI:5 in the last 168 hours  BNP (last 3 results) No results found for this basename: PROBNP:3 in the last 8760 hours CBG: No results found for this basename: GLUCAP:5 in the last 168 hours  Radiological Exams on Admission: Ct Abdomen Pelvis W Contrast  02/12/2012  *RADIOLOGY REPORT*  Clinical Data: Abdominal pain.  Cough.  Aspiration risk.  CT ABDOMEN AND PELVIS WITH CONTRAST  Technique:  Multidetector CT imaging of the abdomen and pelvis was performed following the standard protocol during bolus administration of intravenous contrast.  Contrast: OMNIPAQUE IOHEXOL 300 MG/ML  SOLN  Comparison: Single view abdomen 01/19/2011.  Findings: The lung bases are clear without focal nodule, mass, or airspace disease.  Cardiac pacing wires are noted.  The liver and spleen are within normal limits.  A small hiatal hernia is present.  A gastrostomy tube is in place.  The jejunostomy tube does not leave the stomach.  The duodenum and pancreas are within normal limits.  The study is moderately degraded by patient motion.  The patient is status post cholecystectomy.  The adrenal glands are normal bilaterally.  The kidneys and ureters are within normal limits bilaterally.  The urinary bladder is collapsed about a Foley catheter.  There is a soft tissue mass at the inferior left SI joint which measures 4.3 x 6.5 cm.  There is sclerosis of the associated sacrum.  The reservoir of the penile implant is noted in the left lower quadrant.  A partial sigmoid colectomy has been performed.  A  left lower quadrant colostomy is noted.  A mucous pouch is also present at the same site.  The small bowel is unremarkable.  The more proximal colon and appendix are within normal limits.  Degenerative changes are present in the lower lumbar facets.  The bone windows are otherwise unremarkable.  IMPRESSION:  1.  Soft tissue mass at the inferior aspect of the left SI joint with associated sclerotic changes is worrisome for a primary spur, or metastasis of the none known primary. 2.  No evidence for aspiration pneumonia. 3.  Status post partial colectomy and colostomy. 4.  Gastrostomy tube in place.  The jejunal tube is not X of the stomach. 5.  Atherosclerosis. 6.  Degenerative changes of the lumbar spine.   Original Report Authenticated By: Jamesetta Orleans. MATTERN, M.D.    Dg Chest Portable 1 View  02/12/2012  *RADIOLOGY REPORT*  Clinical Data: The kidney, possible aspiration.  PORTABLE CHEST - 1 VIEW  Comparison: 01/19/2011.  Findings: The heart and mediastinal structures are normal.  The aorta is tortuous.  The lungs are clear.  There are no effusions or pneumothoraces.  There are a sequential cardiac pacing wires in place with the generator on the right.  There are no acute bony changes.  There are postop changes of the cervical spine.  IMPRESSION: No active disease.   Original Report Authenticated By: Mervin Hack, M.D.     EKG: Independently reviewed.  Assessment/Plan Principal Problem:  *Lactic acidosis Active Problems:  Dysphagia due to old stroke  Hypothyroidism  DM (diabetes mellitus)  Altered mental state  Sacral decubitus ulcer, stage IV   1. Lactic acidosis - unclear cause, follow with Q6H lactates, DDx includes 1. Sepsis - felt to be quite possible given recent UTI and apparent UTI on UA, given the severe nature of this with multiple organ failure will treat very aggressively as discussed with CCM with zosyn, vanc, and levoquin.  Fluids for dehydration associated with this.   Another possible source is his sacral decubitus which appears to be a stage 4 just from CT scan (with extension directly to sacrum on review of scan), does not appear to have evidence on physical exam or CT scan of necrotizing fascitis at this point in time. 2. Mesenteric ischemia - possible especially given abdominal findings on exam, will keep patient NPO except meds for now, unfortunately if the patient has mesenteric ischemia he likely would be a very poor surgical candidate and have very poor chance of survival if this were causing his lactic acidosis.  Family understands this. 3. Dehydration - less likely but treating with fluids, lactic acidosis seems quite severe for his suspected degree of dehydration 4. Metformin - less likely given the very low dose of metformin he is on at baseline, never the less will hold metformin as inpatient and treat DM2 with SSI. 5. Cancer - patient has a new soft tissue mass in his SI joint seen on CT scan, this is probably a new diagnosis of metastatic disease from his prior history of prostate cancer in the past, checking PSA, if patient survives current episode will need this further worked up.  Seems less likely that this is causing his lactic acidosis however as the tumor burden seems rather small with just this solitary tumor identified and no recent chemo. 6. CHF leading to hypoperfusion and mesenteric ischemia - unlikely as this would be a new diagnosis for the patient, ruling out AMI with serial troponins, his BP remains stable, 2d echo ordered. 2. AMS - most likely metabolic encephalopathy due to lactic acidosis and acute critical illness.  Code Status: DNI/DNR, discussed with multiple family members and they reaffirm that this is the patients wish, there is also a DNR sheet sent with the patients info from his SNF Family Communication: Spoke with wife, son, and multiple family members at bedside, explained our grave concerns about this patient who is  critically ill and may even not survive despite all medical attempts to treat. Disposition Plan: Admit to inpatient  Time spent: 120 mins spent just in discussion with family, CCM on this critically ill patient, all family questions answered, second opinion by CCM provided to family in ED  Zaid Tomes, Jilda Panda M. [facility] Pager [phone]  If 7PM-7AM, please contact night-coverage [email or web address] Password [email or web address] 02/12/2012, 9:51 PM

## 2012-02-12 NOTE — ED Notes (Signed)
Pt returned from ED.

## 2012-02-12 NOTE — ED Provider Notes (Signed)
History    76yM transferred for evaluation of AMS and possible aspiration because apparently he has been coughing recently. Currently being tx'd for UTI. Paperwork sent with pt show urine cultures drawn on 10/21 which grew out >100,000 colonies/ml of Klebsiella orinthinolytica and also enterococcus sp. Started on ceftriaxone 10/24. Klebsiella sensitive to this. Pt altered from his reported baseline. Does have hx of CVA with R sided hemiparesis and dysphagia but EMS reported that he apparently does talk and understandable. Pt nonverbal for me. No report of trauma. No fever. Reportedly refusing insulin recently at [facility]. Resides at [facility]. Is DNR.   CSN: [id number]  Arrival date & time 02/12/12  1604   First MD Initiated Contact with Patient 02/12/12 1607      Chief Complaint  Patient presents with  . Cough    (Consider location/radiation/quality/duration/timing/severity/associated sxs/prior treatment) HPI  Past Medical History  Diagnosis Date  . Stroke     Hx of with right hemiparesis  . Hypertension   . Endocarditis, mitral valve, syphilitic   . Hypothyroidism   . Anorexia   . Dysphagia   . Reflux   . Bacteremia   . Clostridium difficile colitis   . CVA (cerebral vascular accident)   . Pressure ulcer   . Diabetes mellitus, type 2   . Anemia   . Stroke     Past Surgical History  Procedure Date  . Colostomy   . Peg placement   . Hernia repair     Family History  Problem Relation Age of Onset  . Cancer Mother   . Other Father     old age after a fall  . Alzheimer's disease Sister     History  Substance Use Topics  . Smoking status: Never Smoker   . Smokeless tobacco: Not on file  . Alcohol Use: No      Review of Systems  Level 5 caveat applies because pt is nonverbal.   Allergies  Review of patient's allergies indicates no known allergies.  Home Medications   Current Outpatient Rx  Name Route Sig Dispense Refill  . ACETAMINOPHEN 160 MG/5ML PO  ELIX Tube Give 640 mg by tube every 6 (six) hours as needed. For pain     . ASPIRIN 325 MG PO TABS Tube Give 325 mg by tube daily.      . ATROPINE SULFATE 1 % OP SOLN Oral Take 2 drops by mouth every 6 (six) hours.      Marland Kitchen CITALOPRAM HYDROBROMIDE 10 MG/5ML PO SOLN Tube Give 10 mg by tube daily.      Marland Kitchen FERROUS SULFATE 300 (60 FE) MG/5ML PO SYRP Tube Give 300 mg by tube 2 (two) times daily.      . FUROSEMIDE 20 MG PO TABS Tube Give 20 mg by tube daily.      Marland Kitchen GABAPENTIN 300 MG PO CAPS Tube Give 300 mg by tube at bedtime.      . INSULIN GLARGINE 100 UNIT/ML [place] SOLN Subcutaneous Inject 10 Units into the skin at bedtime.      Marland Kitchen LEVOTHYROXINE SODIUM 150 MCG PO TABS Tube Give 300 mcg by tube daily.      Marland Kitchen LIOTHYRONINE SODIUM 5 MCG PO TABS Oral Take 1 tablet (5 mcg total) by mouth daily. 30 tablet 11  . METFORMIN HCL 500 MG PO TABS Tube Give 500 mg by tube daily.      Marland Kitchen METOPROLOL TARTRATE 25 MG PO TABS Tube Give 25 mg by tube 2 (two)  times daily.      Marland Kitchen MIRTAZAPINE 15 MG PO TABS Tube Give 15 mg by tube at bedtime.      Carma Leaven M PLUS PO TABS Tube Give 1 tablet by tube daily.      Marland Kitchen POTASSIUM CHLORIDE 20 MEQ/15ML (10%) PO LIQD Tube Give 10 mEq by tube daily.      Marland Kitchen PRAVASTATIN SODIUM 40 MG PO TABS Tube Give 40 mg by tube at bedtime.      Marland Kitchen RANITIDINE HCL 75 MG PO TABS Tube Give 150 mg by tube 2 (two) times daily.      Marland Kitchen SACCHAROMYCES BOULARDII 250 MG PO CAPS Tube Give 250 mg by tube 2 (two) times daily.      Raul Del SODIUM 8.6-50 MG PO TABS Tube Give 2 tablets by tube 2 (two) times daily.      Marland Kitchen VITAMIN B-1 100 MG PO TABS Tube Give 100 mg by tube every morning.      Marland Kitchen TRAMADOL HCL 50 MG PO TABS Oral Take 50 mg by mouth every 6 (six) hours as needed.    . TRAZODONE 25 MG HALF TABLET Oral Take 25 mg by mouth at bedtime.    Marland Kitchen VITAMIN C 500 MG PO TABS Tube Give 500 mg by tube 2 (two) times daily.        BP 129/99  Pulse 85  Temp 98.8 F (37.1 C) (Rectal)  Resp 16  SpO2 98%  Physical Exam    Nursing note and vitals reviewed. Constitutional: He appears well-developed.  HENT:       Dry mucus membranes. Lips chapped.   Eyes: Conjunctivae normal are normal. Pupils are equal, round, and reactive to light. Right eye exhibits no discharge. Left eye exhibits no discharge.  Cardiovascular: Normal rate and regular rhythm.   Pulmonary/Chest: Breath sounds normal. No stridor. He has no wheezes. He has no rales.       Mild tachypnea in low 20s. No wheezing/rales/rhonchi appreciated  Abdominal:       Ostomy with pink stoma. No stool or significant air noted in bag. Gastric tube. Pt with significant tenderness and voluntary guarding which seems worse on R side of abdomen. No rebound. Well healed surgical scars. No distension.  Genitourinary:       Stage 3 sacral decubitus ulcer. Not bandaged. Wet-to-dry applied. Foley.  Neurological:       GCS 11 E4, M2, V5. Not following commands. Cannot reliably assess strength or cerebellar function. No facial droop noted. Pt does reach with L hand to grab mine with palpation of his abdomen.   Skin: Skin is warm and dry.    ED Course  Procedures (including critical care time)  CRITICAL CARE Performed by: Raeford Razor   Total critical care time: 35 minutes  Critical care time was exclusive of separately billable procedures and treating other patients.  Critical care was necessary to treat or prevent imminent or life-threatening deterioration.  Critical care was time spent personally by me on the following activities: development of treatment plan with patient and/or surrogate as well as nursing, discussions with consultants, evaluation of patient's response to treatment, examination of patient, obtaining history from patient or surrogate, ordering and performing treatments and interventions, ordering and review of laboratory studies, ordering and review of radiographic studies, pulse oximetry and re-evaluation of patient's condition.   Labs  Reviewed  CBC WITH DIFFERENTIAL - Abnormal; Notable for the following:    WBC 12.4 (*)     Hemoglobin  11.0 (*)     HCT 34.8 (*)     MCH 24.7 (*)     RDW 16.5 (*)     Neutro Abs 8.8 (*)     All other components within normal limits  LACTIC ACID, PLASMA - Abnormal; Notable for the following:    Lactic Acid, Venous 8.8 (*)     All other components within normal limits  BASIC METABOLIC PANEL - Abnormal; Notable for the following:    Glucose, Bld 306 (*)     BUN 58 (*)     Calcium 10.8 (*)     GFR calc non Af Amer 57 (*)     GFR calc Af Amer 66 (*)     All other components within normal limits  URINALYSIS, ROUTINE W REFLEX MICROSCOPIC - Abnormal; Notable for the following:    APPearance HAZY (*)     Hgb urine dipstick MODERATE (*)     Protein, ur 30 (*)     Leukocytes, UA LARGE (*)     All other components within normal limits  URINE MICROSCOPIC-ADD ON - Abnormal; Notable for the following:    Bacteria, UA FEW (*)     All other components within normal limits  PROCALCITONIN  URINE CULTURE  CULTURE, BLOOD (ROUTINE X 2)  CULTURE, BLOOD (ROUTINE X 2)  CBC  CREATININE, SERUM  CBC  COMPREHENSIVE METABOLIC PANEL  URINE CULTURE  CULTURE, EXPECTORATED SPUTUM-ASSESSMENT  LACTIC ACID, PLASMA  LACTIC ACID, PLASMA  LACTIC ACID, PLASMA  TROPONIN I  TROPONIN I  TROPONIN I  TSH  HEMOGLOBIN A1C   Ct Abdomen Pelvis W Contrast  02/12/2012  *RADIOLOGY REPORT*  Clinical Data: Abdominal pain.  Cough.  Aspiration risk.  CT ABDOMEN AND PELVIS WITH CONTRAST  Technique:  Multidetector CT imaging of the abdomen and pelvis was performed following the standard protocol during bolus administration of intravenous contrast.  Contrast: OMNIPAQUE IOHEXOL 300 MG/ML  SOLN  Comparison: Single view abdomen 01/19/2011.  Findings: The lung bases are clear without focal nodule, mass, or airspace disease.  Cardiac pacing wires are noted.  The liver and spleen are within normal limits.  A small hiatal hernia  is present.  A gastrostomy tube is in place.  The jejunostomy tube does not leave the stomach.  The duodenum and pancreas are within normal limits.  The study is moderately degraded by patient motion.  The patient is status post cholecystectomy.  The adrenal glands are normal bilaterally.  The kidneys and ureters are within normal limits bilaterally.  The urinary bladder is collapsed about a Foley catheter.  There is a soft tissue mass at the inferior left SI joint which measures 4.3 x 6.5 cm.  There is sclerosis of the associated sacrum.  The reservoir of the penile implant is noted in the left lower quadrant.  A partial sigmoid colectomy has been performed.  A left lower quadrant colostomy is noted.  A mucous pouch is also present at the same site.  The small bowel is unremarkable.  The more proximal colon and appendix are within normal limits.  Degenerative changes are present in the lower lumbar facets.  The bone windows are otherwise unremarkable.  IMPRESSION:  1.  Soft tissue mass at the inferior aspect of the left SI joint with associated sclerotic changes is worrisome for a primary spur, or metastasis of the none known primary. 2.  No evidence for aspiration pneumonia. 3.  Status post partial colectomy and colostomy. 4.  Gastrostomy tube in  place.  The jejunal tube is not X of the stomach. 5.  Atherosclerosis. 6.  Degenerative changes of the lumbar spine.   Original Report Authenticated By: Jamesetta Orleans. MATTERN, M.D.    Dg Chest Portable 1 View  02/12/2012  *RADIOLOGY REPORT*  Clinical Data: The kidney, possible aspiration.  PORTABLE CHEST - 1 VIEW  Comparison: 01/19/2011.  Findings: The heart and mediastinal structures are normal.  The aorta is tortuous.  The lungs are clear.  There are no effusions or pneumothoraces.  There are a sequential cardiac pacing wires in place with the generator on the right.  There are no acute bony changes.  There are postop changes of the cervical spine.  IMPRESSION: No  active disease.   Original Report Authenticated By: Mervin Hack, M.D.     EKG:  Rhythm: normal sinus Rate: 90 Axis: normal Intervals: 1st degree AV block ST segments: NS ST changes   1. Acute confusion   2. Elevated lactic acid level   3. Elevated BUN   4. Dehydration   5. Hyperglycemia       MDM  76yM with AMS. Consider infectious etiology. Currently being tx'd for UTI with ceftriaxone.  Foley replaced. Mild tachypnea and report of increased cough. CXR without focal infiltrate. May potentially have pneumonia which hasn't "blossomed" radiographically because of dehydration. Pt is afebrile and BP is fine. Significantly elevated lactic acid and because of this broad spectrum empiric abx were ordered. May potentially be mesenteric ischemia with seeming abdominal tenderness. CT a/p relatively unremarkable, but not angiographic study.  Pt is Will require admission for further evaluation and observation.         Raeford Razor, MD 02/12/12 2205

## 2012-02-13 ENCOUNTER — Inpatient Hospital Stay (HOSPITAL_COMMUNITY): Payer: Medicare Other

## 2012-02-13 DIAGNOSIS — F05 Delirium due to known physiological condition: Secondary | ICD-10-CM

## 2012-02-13 DIAGNOSIS — M549 Dorsalgia, unspecified: Secondary | ICD-10-CM

## 2012-02-13 DIAGNOSIS — I369 Nonrheumatic tricuspid valve disorder, unspecified: Secondary | ICD-10-CM

## 2012-02-13 LAB — GLUCOSE, CAPILLARY
Glucose-Capillary: 187 mg/dL — ABNORMAL HIGH (ref 70–99)
Glucose-Capillary: 171 mg/dL — ABNORMAL HIGH (ref 70–99)
Glucose-Capillary: 146 mg/dL — ABNORMAL HIGH (ref 70–99)
Glucose-Capillary: 143 mg/dL — ABNORMAL HIGH (ref 70–99)

## 2012-02-13 LAB — URINE CULTURE: Colony Count: 75000

## 2012-02-13 LAB — MRSA PCR SCREENING: MRSA by PCR: NEGATIVE

## 2012-02-13 MED ORDER — VANCOMYCIN HCL IN DEXTROSE 1-5 GM/200ML-% IV SOLN
1000.0000 mg | INTRAVENOUS | Status: DC
  Administered 2012-02-13 – 2012-02-14 (×2): 1000 mg via INTRAVENOUS
  Filled 2012-02-13 (×3): qty 200

## 2012-02-13 MED ORDER — VANCOMYCIN HCL IN DEXTROSE 1-5 GM/200ML-% IV SOLN
1000.0000 mg | Freq: Two times a day (BID) | INTRAVENOUS | Status: DC
Start: 2012-02-13 — End: 2012-02-13
  Filled 2012-02-13: qty 200

## 2012-02-13 MED ORDER — BIOTENE DRY MOUTH MT LIQD
15.0000 mL | Freq: Two times a day (BID) | OROMUCOSAL | Status: DC
  Administered 2012-02-13 – 2012-02-15 (×6): 15 mL via OROMUCOSAL

## 2012-02-13 MED ORDER — MORPHINE SULFATE 2 MG/ML IJ SOLN
0.5000 mg | INTRAMUSCULAR | Status: DC | PRN
  Administered 2012-02-13: 0.5 mg via INTRAVENOUS

## 2012-02-13 MED ORDER — MORPHINE SULFATE 2 MG/ML IJ SOLN
INTRAMUSCULAR | Status: AC
  Filled 2012-02-13: qty 1

## 2012-02-13 MED ORDER — MORPHINE SULFATE 2 MG/ML IJ SOLN: 2.0000 mg | INTRAMUSCULAR | Status: DC | PRN

## 2012-02-13 MED ORDER — ACETAMINOPHEN 10 MG/ML IV SOLN
1000.0000 mg | Freq: Four times a day (QID) | INTRAVENOUS | Status: AC
  Administered 2012-02-13 (×4): 1000 mg via INTRAVENOUS
  Filled 2012-02-13 (×4): qty 100

## 2012-02-13 MED ORDER — CHLORHEXIDINE GLUCONATE 0.12 % MT SOLN
15.0000 mL | Freq: Two times a day (BID) | OROMUCOSAL | Status: DC
  Administered 2012-02-13 – 2012-02-16 (×7): 15 mL via OROMUCOSAL
  Filled 2012-02-13 (×9): qty 15

## 2012-02-13 NOTE — Progress Notes (Signed)
EEG completed at bedside 

## 2012-02-13 NOTE — Procedures (Signed)
History: 76 yo M with altered mental status in the setting of multiple medical comorbidities.   Background: There is a poorly sustained posterior dominant rhythm of 8.5 Hz that attenuates with eye opening. There is irregular generalized delta activity.   Photic stimulation: Physiologic driving is present  EEG Diagnosis: 1) Generalized slow activity  Clinical Interpretation: This normal EEG is consistent with a mild nonspecific generalized cerebral dysfunction(encephalopathy). There was no seizure or seizure predisposition recorded on this study.   Ritta Slot, MD [facility] [phone]  If 7pm- 7am, please page neurology on call at [phone].

## 2012-02-13 NOTE — Significant Event (Signed)
Patient seen again at bedside with BPs 65/40... Addressed again code status and wishes with family, again reaffirmed DNR/DNI status.  Again told family prognosis was likely very poor without invasive measures / life support but that I agree with their decision since the patient had indicated previously that this is what he would have wanted.  Family expresses understanding that patient is essentially actively dying at this point, will continue fluids, abx, d/cing BP meds, ordering small doses of morphine prn for comfort from air hunger.  No new lab draws or needle sticks per family request, agree with this and will follow their wishes and try and keep patient comfortably.  Prognosis is very poor.

## 2012-02-13 NOTE — Progress Notes (Signed)
*  PRELIMINARY RESULTS* Echocardiogram 2D Echocardiogram has been performed.  Jeryl Columbia 02/13/2012, 4:52 PM

## 2012-02-13 NOTE — Progress Notes (Signed)
Patient AV:[id number] [name]      DOB: 1936-01-26      BJY:[id number]  Goals of care completed with family and then we visited with patient . Patient has made it clear to his family that he would not want to prolong his life if quality was not present along with dignity.  Family coming to grips with these thoughts.  At this time they need to see the results of the PSA, EEg, and ECho before further goals can be established.  They are ok with treating his infection and pain but would not desire to escalate care should his condition worsen.  They confirm his DNR status.  Full consult note to follow.

## 2012-02-13 NOTE — Progress Notes (Signed)
Pt bp 68/40 checked manually. Md on call was made aware. Pt received a 1000 cc fluid bolus per MD. Pt's BP went to102/44. Will cont to monitor pt.

## 2012-02-13 NOTE — Progress Notes (Signed)
PROGRESS NOTE  MATHIEU ZEHM WUJ:[id number] DOB: Oct 05, 1935 DOA: 02/12/2012 PCP: No primary provider on file.  Brief narrative: 76 yr old AAM admitted with likely Sepsis 2/2 UTI  Presented to [facility] with ?likely severe sepsis potentially of Urinary/Aspiartion causes.  Has a h/o multiple co-morbidities inclusive of CVA in the past, has a h/o diverting colostomy and PEG feeds.  Overnight, per Dr. 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 Soft tissue mass at the inferior aspect of the left SI joint with associated sclerotic changes is worrisome for a primary spur, or metastasis of the none known primary. 2. No evidence for aspiration pneumonia. 3. Status post partial colectomy and colostomy. 4. Gastrostomy  tube in place. The jejunal tube is not X of the stomach. 5. Atherosclerosis. 6. Degenerative changes of the lumbar spine.   EEG pending  Antibiotics:  Levaquin 10/29  Zosyn 10/29   Subjective  Barely able to verbalize, however is somewhat alert. The first 5 questions of the MMSE were attempted with the patient and patient could only tell me that he was in the hospital. Multiple family members at bedside.   Objective    Interim History: Overnight events noted  Telemetry: Currently sinus rhythm in the 70s however was tachycardic overnight  Objective: Filed Vitals:   02/12/12 2344 02/13/12 0018 02/13/12 0130 02/13/12 0400  BP: 65/40 98/58 102/44 110/66  Pulse:    88  Temp:    98.3 F (36.8 C)  TempSrc:      Resp:    12  Height:      Weight:      SpO2:    97%    Intake/Output Summary (Last 24 hours) at 02/13/12 0931 Last data filed at 02/13/12 0500  Gross per 24 hour  Intake      0 ml  Output    450 ml  Net   -450 ml    Exam:  General: Alert and African American male, arcus senilis present, no icterus or pallor, no specific deviation of the mouth. Patient has a facial tic Cardiovascular: S1-S2 irregular rhythm Respiratory: Clinically clear, no added sound Abdomen: Soft nontender colostomy in left side of abdomen with stool in it, feeding tube present in in situ Skin sacral decubitus not examined today. Nursing instructed to inform me when they're bathing or changing him psych and pressure that is Neuro moving limbs however is definitely weaker on the right upper extremity and left  lower extremity. Sensation seems intact, GCS is about 12  Data Reviewed: Basic Metabolic Panel:  Lab 02/12/12 1610  NA 140  K 4.7  CL 98  CO2 23  GLUCOSE 306*  BUN 58*  CREATININE 1.21  CALCIUM 10.8*  MG --  PHOS --   Liver Function Tests: No results found for this basename: AST:5,ALT:5,ALKPHOS:5,BILITOT:5,PROT:5,ALBUMIN:5 in the last 168 hours No results found for this  basename: LIPASE:5,AMYLASE:5 in the last 168 hours No results found for this basename: AMMONIA:5 in the last 168 hours CBC:  Lab 02/12/12 1638  WBC 12.4*  NEUTROABS 8.8*  HGB 11.0*  HCT 34.8*  MCV 78.2  PLT 379   Cardiac Enzymes: No results found for this basename: CKTOTAL:5,CKMB:5,CKMBINDEX:5,TROPONINI:5 in the last 168 hours BNP: No components found with this basename: POCBNP:5 CBG:  Lab 02/13/12 0751 02/13/12 0416 02/13/12 0110 02/12/12 2346  GLUCAP 171* 187* 223* 236*    Recent Results (from the past 240 hour(s))  CULTURE, BLOOD (ROUTINE X 2)     Status: Normal (Preliminary result)   Collection Time   02/12/12  5:48 PM      Component Value Range Status Comment   Specimen Description BLOOD HAND LEFT   Final    Special Requests BOTTLES DRAWN AEROBIC ONLY [facility]   Final    Culture  Setup Time 02/12/2012 23:05   Final    Culture     Final    Value:        BLOOD CULTURE RECEIVED NO GROWTH TO DATE CULTURE WILL BE HELD FOR 5 DAYS BEFORE ISSUING A FINAL NEGATIVE REPORT   Report Status PENDING   Incomplete   MRSA PCR SCREENING     Status: Normal   Collection Time   02/12/12 11:58 PM      Component Value Range Status Comment   MRSA by PCR NEGATIVE  NEGATIVE Final      Studies:              All Imaging reviewed and is as per above notation   Scheduled Meds:   . acetaminophen  1,000 mg Intravenous Q6H  . antiseptic oral rinse  15 mL Mouth Rinse q12n4p  . aspirin  325 mg Oral Daily  . atropine  2 drop Sublingual Q6H  . chlorhexidine  15 mL Mouth Rinse BID  . citalopram  10 mg Per Tube Daily  . docusate  100 mg Oral BID  . famotidine (PEPCID) IV  20 mg Intravenous Q12H  . ferrous sulfate  300 mg Per Tube BID  . gabapentin  300 mg Per Tube QHS  . heparin  5,000 Units Subcutaneous Q8H  . insulin aspart  0-9 Units Subcutaneous Q4H  . levofloxacin (LEVAQUIN) IV  750 mg Intravenous Q24H  . levothyroxine  300 mcg Per Tube QAC breakfast  . liothyronine  25 mcg Per Tube QAC  breakfast  . morphine      . multivitamin  5 mL Per Tube Daily  . piperacillin-tazobactam (ZOSYN)  IV  3.375 g Intravenous Once  . piperacillin-tazobactam (ZOSYN)  IV  3.375 g Intravenous Q8H  . saccharomyces boulardii  250 mg Oral BID  . sennosides  10 mL Oral BID  . simvastatin  40 mg Oral q1800  . sodium chloride  1,000 mL Intravenous Once  . sodium chloride  1,000 mL Intravenous Once  . sodium chloride  3 mL Intravenous Q12H  . thiamine  100 mg Per Tube BH-q7a  . traZODone  25 mg Oral QHS  .  vancomycin  1,000 mg Intravenous Once  . vancomycin  1,000 mg Intravenous Q24H  . vitamin C  500 mg Per Tube BID  . DISCONTD: gabapentin  300 mg Per Tube QHS  . DISCONTD: metoprolol tartrate  25 mg Per Tube BID  . DISCONTD: metoprolol tartrate  25 mg Per Tube BID  . DISCONTD: multivitamins ther. w/minerals  1 tablet Oral Daily  . DISCONTD: piperacillin-tazobactam (ZOSYN)  IV  3.375 g Intravenous Q6H  . DISCONTD: sennosides-docusate sodium  2 tablet Per Tube BID   Continuous Infusions:   . sodium chloride 125 mL/hr at 02/13/12 0523     Assessment/Plan: 1. Sepsis, likely severe and from a urinary cause-CT scan does not confirm pneumonia, urine culture is pending. Will visualize the sacral decubitus  later however osteomyelitis would've shown up on the CT scan. Continue broad-spectrum Zosyn, vancomysin and discontinue levofloxacin. Continue IV fluid repletion at 125 cc per hour. Blood pressures are still borderline low however he does have a potential history of heart failure therefore we will need to be careful with how much fluid we gave him. He may have boluses of 500 cc if his fluid sustained below 90/50. He has had no spike of temperature overnight-critical care consult is pending 2. Acute toxic metabolic encephalopathy-probably related to #1 3. Acute kidney injury/acute renal failure secondary to sepsis-BUN/creatinine grossly elevated over normal. Discontinue nephrotoxins. 4. History of  infective endocarditis in 01/2011-obtain echocardiogram-unclear if this will delineate further course and need for treatment. If need be we will need to obtain her notes from [facility]. 5. History of CVA-supportive therapy. Continue aspirin 325 mg daily 6. History of hypothyroidism-continue levothyroxine 300 mcg daily, Cytomel 25 mcg daily. If his metabolic encephalopathy continues, will get TSH T3 and T4 levels 7.  Diabetes mellitus-continue sliding scale coverage-patient is n.p.o. 8. ? History glaucoma patient gets atropine drops 1% ophthalmic 2 drops every 6 hourly 9. History sacral decubitus-mattress overlay-we will look at the wounds probably when the patient has been changed 10. History depression-continue Celexa suspension 10 mg daily. 11. History hyperlipidemia-will discontinue Zocor at present time given poor likely outcome  Code Status:  DNR/DNI confirmed at bedside  Family Communication:  long detailed discussion done with her son and primary health care giver Crespin.explain her course and duration of sepsis and likely etiology of this. If patient does not turn around and a day to 2 days, we will discuss further trajectory of care and likely goals of care. Palate medicine Dr. Ladona Ridgel has already been consulted and will be coordinating with family-consult appreciated in advance  Disposition Plan:  telemetry    Pleas Koch, MD  [facility] Pager [phone] 02/13/2012, 9:31 AM    LOS: 1 day

## 2012-02-13 NOTE — Progress Notes (Signed)
INITIAL ADULT NUTRITION ASSESSMENT Date: 02/13/2012   Time: 3:43 PM  INTERVENTION:  If EN desired, recommend Jevity 1.2 formula --- initiate at 15 ml/hr and increase by 10 ml every 4 hours to goal rate of 75 ml/hr to provide 2160 kcals, 100 gm protein, 1453 ml of free water RD to follow for nutrition care plan  DOCUMENTATION CODES Per approved criteria  -Not Applicable   Reason for Assessment: Malnutrition Screening Tool Report  ASSESSMENT: Male 76 y.o.  Dx: Lactic acidosis  Hx:  Past Medical History  Diagnosis Date  . Stroke     Hx of with right hemiparesis  . Hypertension   . Endocarditis, mitral valve, syphilitic   . Hypothyroidism   . Anorexia   . Dysphagia   . Reflux   . Bacteremia   . Clostridium difficile colitis   . CVA (cerebral vascular accident)   . Pressure ulcer(707.0)   . Diabetes mellitus, type 2   . Anemia   . Stroke     Related Meds:     . acetaminophen  1,000 mg Intravenous Q6H  . antiseptic oral rinse  15 mL Mouth Rinse q12n4p  . aspirin  325 mg Oral Daily  . atropine  2 drop Sublingual Q6H  . chlorhexidine  15 mL Mouth Rinse BID  . citalopram  10 mg Per Tube Daily  . docusate  100 mg Oral BID  . famotidine (PEPCID) IV  20 mg Intravenous Q12H  . ferrous sulfate  300 mg Per Tube BID  . gabapentin  300 mg Per Tube QHS  . heparin  5,000 Units Subcutaneous Q8H  . insulin aspart  0-9 Units Subcutaneous Q4H  . levothyroxine  300 mcg Per Tube QAC breakfast  . liothyronine  25 mcg Per Tube QAC breakfast  . morphine      . multivitamin  5 mL Per Tube Daily  . piperacillin-tazobactam (ZOSYN)  IV  3.375 g Intravenous Once  . piperacillin-tazobactam (ZOSYN)  IV  3.375 g Intravenous Q8H  . saccharomyces boulardii  250 mg Oral BID  . sennosides  10 mL Oral BID  . sodium chloride  1,000 mL Intravenous Once  . sodium chloride  1,000 mL Intravenous Once  . sodium chloride  3 mL Intravenous Q12H  . thiamine  100 mg Per Tube BH-q7a  . traZODone  25  mg Oral QHS  . vancomycin  1,000 mg Intravenous Q24H  . vitamin C  500 mg Per Tube BID  . DISCONTD: gabapentin  300 mg Per Tube QHS  . DISCONTD: levofloxacin (LEVAQUIN) IV  750 mg Intravenous Q24H  . DISCONTD: metoprolol tartrate  25 mg Per Tube BID  . DISCONTD: metoprolol tartrate  25 mg Per Tube BID  . DISCONTD: multivitamins ther. w/minerals  1 tablet Oral Daily  . DISCONTD: piperacillin-tazobactam (ZOSYN)  IV  3.375 g Intravenous Q6H  . DISCONTD: sennosides-docusate sodium  2 tablet Per Tube BID  . DISCONTD: simvastatin  40 mg Oral q1800  . DISCONTD: vancomycin  1,000 mg Intravenous Once  . DISCONTD: vancomycin  1,000 mg Intravenous Q24H  . DISCONTD: vancomycin  1,000 mg Intravenous Q12H    Ht: 6\' 2"  (188 cm)  Wt: 210 lb 1.6 oz (95.3 kg)  Wt Readings from Last 5 Encounters:  02/12/12 210 lb 1.6 oz (95.3 kg)  06/18/11 219 lb (99.338 kg)  05/22/11 210 lb (95.255 kg)  02/20/11 219 lb 12.8 oz (99.7 kg)    Ideal Wt: 86.3 kg % Ideal Wt: 110%  Usual Wt: unable to obtain % Usual Wt: ---  Body mass index is 26.98 kg/(m^2).  Food/Nutrition Related Hx: recent weight lost without trying and decreased appetite per admission nutrition screen  Labs:  CMP     Component Value Date/Time   NA 140 02/12/2012 1638   K 4.7 02/12/2012 1638   CL 98 02/12/2012 1638   CO2 23 02/12/2012 1638   GLUCOSE 306* 02/12/2012 1638   BUN 58* 02/12/2012 1638   CREATININE 1.21 02/12/2012 1638   CALCIUM 10.8* 02/12/2012 1638   PROT 8.0 02/13/2011 1923   ALBUMIN 3.1* 02/13/2011 1923   AST 25 02/13/2011 1923   ALT 14 02/13/2011 1923   ALKPHOS 68 02/13/2011 1923   BILITOT 0.5 02/13/2011 1923   GFRNONAA 57* 02/12/2012 1638   GFRAA 66* 02/12/2012 1638     Intake/Output Summary (Last 24 hours) at 02/13/12 1544 Last data filed at 02/13/12 0900  Gross per 24 hour  Intake      0 ml  Output    450 ml  Net   -450 ml    CBG (last 3)   Basename 02/13/12 1221 02/13/12 0751 02/13/12 0416  GLUCAP  146* 171* 187*    Diet Order: NPO  Supplements/Tube Feeding: N/A  IVF:    sodium chloride Last Rate: 125 mL/hr at 02/13/12 1417    Estimated Nutritional Needs:   Kcal: 2000-2200 Protein: 100-110 gm Fluid: 2.0-2.2 L  Patient seen in the ED for AMS and possible aspiration; in ED his lactic acid was measured to be as high as 8.8, he had a mild leukocytosis of 12 and patient subsequently admitted.  RD spoke with RN; goals of care meeting with Palliative Care Team scheduled for today at 4:00 pm. RD unable to obtain nutrition hx at this time; patient does have a PEG tube in place.  NUTRITION DIAGNOSIS: -Inadequate oral intake (NI-2.1).  Status: Ongoing  RELATED TO: inability to eat  AS EVIDENCE BY: NPO status  MONITORING/EVALUATION(Goals): Goal: Comfort feeds vs EN support to meet >/= 90% of estimated nutrition needs Monitor: Goals of care, EN initiation, PO intake, weight, labs, I/O's  EDUCATION NEEDS: -No education needs identified at this time  Kirkland Hun, RD, LDN Pager #: [phone] After-Hours Pager #: [phone]

## 2012-02-13 NOTE — Consult Note (Signed)
Patient [name]      DOB: [birth year]      BJY:[id number]     Consult Note from the Palliative Medicine Team at [facility]    Consult Requested by: [name]     PCP: No primary provider on file. Reason for Consultation: Goals of care related symptom recommendations  Assessment of patients Current state: Patient is a 76 year old [place] American male with a known past medical history for stroke with right hemiparesis, generalized ability secondary to the same. Patient has had a stage III 4 decubitus on his lower back and buttocks, and according to his wife frequent infections. Documented infections include endocarditis. His wife states he's had sepsis before. Patient was brought to the hospital because of altered mental status, and inability to communicate with his family. He also developed a facial grimace which was intermittent in nature. Patient had recently had a urine culture drawn which was growing Klebsiella and enterococcus. The patient was admitted to the hospital and started on antibiotics to cover for sepsis related to urinary tract infection, and an investigation related to the soft tissue mass in his left pelvic area. The patient is a history of prostate cancer and so a PSA was ordered. Patient developed hypotension indicative of sepsis was treated conservatively during the night and seems to have improved over the course of the evening into today. The patient's only requested that he have water. He is highly educated and intelligent but is difficult to decide whether he truly understands what happening around him. His family states that they do not want him to suffer and they see a cyclic pattern starting to develop which is placing him in a situation of frustration, and impacting his dignity and quality of life. The patient's children feel he is suffering and his wife is coming to this conclusion as well as the patient is showing that he cannot swallow effectively, and is showing  the frustration of not being able to be able to eat or drink. The family decided that the current treatment is acceptable while they wait for further testing regarding the source of the soft tissue mass in his pelvis. We talked briefly about the benefits of hospice care including possible residential hospice. Further conversations will need to be had as information becomes available.   Goals of Care: 1.  Code Status: DO NOT RESUSCITATE, DO NOT INTUBATE, do not escalate care in the form of vasopressors.   2. Scope of Treatment: 1. Vital Signs: Continue as current 2. Respiratory/Oxygen: As needed oxygen therapy for comfort 3. Nutritional Support/Tube Feeds: Continue nutritional support for now. We did discuss the potential for discontinuation of tube feeds in the future. 4. Antibiotics: Continue current antibiotics 5. Blood Products: Blood products would likely not need to be request for comfort care in the face of treating some of the treatable. I would discuss with the family is an emergency occurred necessitating a decision regarding blood products however at this time they're expressing that this will likely be an aggressive measures they would not wish to take. 6. IVF: Continue as needed 7. Labs: Continue as needed 8. Consults: The patient has been seen in outpatient setting by wound care specialist. If it would promote comfort, having wound care team reevaluate would be appropriate but not necessary as he just had his most recent visit.   4. Disposition: To be determined, residential hospice has been discussed. Patient is currently at [facility] place.   3. Symptom Management:   1. Anxiety/Agitation:  Reassurance regarding his current state will be appropriate. I believe the family will ultimately need to decide on comfort feeding if the patient continues to insist on receiving fluids and food. 2. Pain: The family states that the patient has a low threshold for pain. They feel that the current  dosing of morphine is appropriate in that he is not in any discomfort. The patient does not admit to any discomfort at this time. Generally his pain is in his back or his wound is 3. Bowel Regimen: Will monitor 4. Fever: Agree with Tylenol 5. Nausea/Vomiting: As needed medication will be provided 6.   4. Psychosocial: The patient has a doctorate in secondary education. He was a Optician, dispensing. He has a large family including his son [name], son [name], son [name], son [name] and daughter [name]. His wife [name] is dedicated to him and admits that she is coming to grips with the potential loss from her life. The family was able to openly and honestly discussed there worries and concerns and came together very nicely during her meeting.  5. Spiritual: Patient is a Curator. His son [name] states that they've feel that he has made his peace with the [name] and that gives him great comfort. Family states that Thanksgiving is their biggest time to come together as the family.    Patient Documents Completed or Given: Document Given Completed  Advanced Directives Pkt    MOST    DNR    Gone from My Sight    Hard Choices      Brief HPI: Patient is a 76 year old African American male presented to the hospital for worsening cognitive status and new facial grimacing. The patient experienced hypotension indicative of possible Sirs or sepsis. We are asked to meet with this family to discuss future goals of care. The patient has limited ability to interact at this time but the family would like to include him in our conversation as he improves.   ROS: Attempted, patient unable to participate secondary to focus on specifically of obtaining a water.    PMH:  Past Medical History  Diagnosis Date  . Stroke     Hx of with right hemiparesis  . Hypertension   . Endocarditis, mitral valve, syphilitic   . Hypothyroidism   . Anorexia   . Dysphagia   . Reflux   . Bacteremia   . Clostridium difficile colitis   . CVA  (cerebral vascular accident)   . Pressure ulcer(707.0)   . Diabetes mellitus, type 2   . Anemia   . Stroke      PSH: Past Surgical History  Procedure Date  . Colostomy   . Peg placement   . Hernia repair    I have reviewed the FH and SH and  If appropriate update it with new information. No Known Allergies Scheduled Meds:   . acetaminophen  1,000 mg Intravenous Q6H  . antiseptic oral rinse  15 mL Mouth Rinse q12n4p  . aspirin  325 mg Oral Daily  . atropine  2 drop Sublingual Q6H  . chlorhexidine  15 mL Mouth Rinse BID  . citalopram  10 mg Per Tube Daily  . docusate  100 mg Oral BID  . famotidine (PEPCID) IV  20 mg Intravenous Q12H  . ferrous sulfate  300 mg Per Tube BID  . gabapentin  300 mg Per Tube QHS  . heparin  5,000 Units Subcutaneous Q8H  . insulin aspart  0-9 Units Subcutaneous Q4H  . levothyroxine  300  mcg Per Tube QAC breakfast  . liothyronine  25 mcg Per Tube QAC breakfast  . morphine      . multivitamin  5 mL Per Tube Daily  . piperacillin-tazobactam (ZOSYN)  IV  3.375 g Intravenous Once  . piperacillin-tazobactam (ZOSYN)  IV  3.375 g Intravenous Q8H  . saccharomyces boulardii  250 mg Oral BID  . sennosides  10 mL Oral BID  . sodium chloride  1,000 mL Intravenous Once  . sodium chloride  1,000 mL Intravenous Once  . sodium chloride  3 mL Intravenous Q12H  . thiamine  100 mg Per Tube BH-q7a  . traZODone  25 mg Oral QHS  . vancomycin  1,000 mg Intravenous Q24H  . vitamin C  500 mg Per Tube BID  . DISCONTD: gabapentin  300 mg Per Tube QHS  . DISCONTD: levofloxacin (LEVAQUIN) IV  750 mg Intravenous Q24H  . DISCONTD: metoprolol tartrate  25 mg Per Tube BID  . DISCONTD: metoprolol tartrate  25 mg Per Tube BID  . DISCONTD: multivitamins ther. w/minerals  1 tablet Oral Daily  . DISCONTD: piperacillin-tazobactam (ZOSYN)  IV  3.375 g Intravenous Q6H  . DISCONTD: sennosides-docusate sodium  2 tablet Per Tube BID  . DISCONTD: simvastatin  40 mg Oral q1800  .  DISCONTD: vancomycin  1,000 mg Intravenous Once  . DISCONTD: vancomycin  1,000 mg Intravenous Q24H  . DISCONTD: vancomycin  1,000 mg Intravenous Q12H   Continuous Infusions:   . sodium chloride 125 mL/hr at 02/13/12 1417   PRN Meds:.morphine injection, promethazine, DISCONTD: acetaminophen, DISCONTD: acetaminophen (TYLENOL) oral liquid 160 mg/5 mL, DISCONTD:  morphine injection    BP 92/58  Pulse 74  Temp 97 F (36.1 C) (Oral)  Resp 18  Ht 6\' 2"  (1.88 m)  Wt 95.3 kg (210 lb 1.6 oz)  BMI 26.98 kg/m2  SpO2 95%   PPS: 10-20%   Intake/Output Summary (Last 24 hours) at 02/13/12 1853 Last data filed at 02/13/12 0900  Gross per 24 hour  Intake      0 ml  Output    450 ml  Net   -450 ml   LBM: 02/13/2012                      Stool Softner: Monitor while on opiates  Physical Exam:  General: awaken spontaneously to activity in the room. Patient is noticed to have facial grimacing taking and lipsmacking. He is very focused on the need to have a water and does not express an understanding of the risks for taking water in.  He recognized his sons and family. HEENT:  Pupils are equal round and reactive to light, extraocular muscles appear to be intact, mucous membranes are dry, he is slight coating on his tongue. Dentition appears intact Chest:   Decreased but clear to auscultation CVS: Regular rate and rhythm positive S1 and S2 no S3-S4  Abdomen: Soft, nondistended with PEG tube in place Ext: multiple small wounds on his toes with some edema bilaterally Neuro: Oriented to himself and family but insistent upon having water which may represent a deficit in ability to reason.  Labs: CBC    Component Value Date/Time   WBC 12.4* 02/12/2012 1638   RBC 4.45 02/12/2012 1638   HGB 11.0* 02/12/2012 1638   HCT 34.8* 02/12/2012 1638   PLT 379 02/12/2012 1638   MCV 78.2 02/12/2012 1638   MCH 24.7* 02/12/2012 1638   MCHC 31.6 02/12/2012 1638   RDW 16.5*  02/12/2012 1638   LYMPHSABS 2.4  02/12/2012 1638   MONOABS 1.0 02/12/2012 1638   EOSABS 0.1 02/12/2012 1638   BASOSABS 0.1 02/12/2012 1638    CMP     Component Value Date/Time   NA 140 02/12/2012 1638   K 4.7 02/12/2012 1638   CL 98 02/12/2012 1638   CO2 23 02/12/2012 1638   GLUCOSE 306* 02/12/2012 1638   BUN 58* 02/12/2012 1638   CREATININE 1.21 02/12/2012 1638   CALCIUM 10.8* 02/12/2012 1638   PROT 8.0 02/13/2011 1923   ALBUMIN 3.1* 02/13/2011 1923   AST 25 02/13/2011 1923   ALT 14 02/13/2011 1923   ALKPHOS 68 02/13/2011 1923   BILITOT 0.5 02/13/2011 1923   GFRNONAA 57* 02/12/2012 1638   GFRAA 66* 02/12/2012 1638    Chest Xray Reviewed/Impressions: No active disease  CT scan of the Head Reviewed/Impressions:  Soft tissue mass at the inferior aspect of the left SI joint  with associated sclerotic changes is worrisome for a primary spur,  or metastasis of the none known primary.  2. No evidence for aspiration pneumonia.  3. Status post partial colectomy and colostomy.  4. Gastrostomy tube in place. The jejunal tube is not X of the  stomach.  5. Atherosclerosis.  6. Degenerative changes of the lumbar spine.     Time In Time Out Total Time Spent with Patient Total Overall Time  4:30 PM   5:45 PM   30 minutes   75 minutes     Greater than 50%  of this time was spent counseling and coordinating care related to the above assessment and plan. Riniyah Speich L. Ladona Ridgel, MD MBA The Palliative Medicine Team at [facility] Phone: [phone] Pager: [phone]

## 2012-02-13 NOTE — Progress Notes (Signed)
Patient ZO:[id number] [name]      DOB: Aug 30, 1935      VWU:[id number]   Planned  GOC meeting with family at 430 pm today.  [place] of [place].  Discussed with Samtani,  [facility] L. Ladona Ridgel, MD MBA The Palliative Medicine Team at [facility] Phone: [phone] Pager: [phone]

## 2012-02-14 DIAGNOSIS — M549 Dorsalgia, unspecified: Secondary | ICD-10-CM

## 2012-02-14 DIAGNOSIS — I69991 Dysphagia following unspecified cerebrovascular disease: Secondary | ICD-10-CM

## 2012-02-14 LAB — CBC
HCT: 31.9 % — ABNORMAL LOW (ref 39.0–52.0)
Hemoglobin: 9.9 g/dL — ABNORMAL LOW (ref 13.0–17.0)
MCH: 24.6 pg — ABNORMAL LOW (ref 26.0–34.0)
MCHC: 31 g/dL (ref 30.0–36.0)
MCV: 79.2 fL (ref 78.0–100.0)

## 2012-02-14 LAB — GLUCOSE, CAPILLARY
Glucose-Capillary: 109 mg/dL — ABNORMAL HIGH (ref 70–99)
Glucose-Capillary: 121 mg/dL — ABNORMAL HIGH (ref 70–99)
Glucose-Capillary: 103 mg/dL — ABNORMAL HIGH (ref 70–99)
Glucose-Capillary: 106 mg/dL — ABNORMAL HIGH (ref 70–99)

## 2012-02-14 LAB — COMPREHENSIVE METABOLIC PANEL
BUN: 37 mg/dL — ABNORMAL HIGH (ref 6–23)
Calcium: 9.3 mg/dL (ref 8.4–10.5)
GFR calc Af Amer: 63 mL/min — ABNORMAL LOW (ref >90)
Glucose, Bld: 126 mg/dL — ABNORMAL HIGH (ref 70–99)
Total Protein: 7.6 g/dL (ref 6.0–8.3)

## 2012-02-14 NOTE — Progress Notes (Signed)
PROGRESS NOTE  [name] XBJ:[id number] DOB: 08-09-1935 DOA: 02/12/2012 PCP: No primary provider on file.  Brief narrative: 76 yr old AAM admitted with likely Sepsis 2/2 UTI  Presented to [facility] with ?likely severe sepsis potentially of Urinary/Aspiartion causes.  Has a h/o multiple co-morbidities inclusive of CVA in the past, has a h/o diverting colostomy and PEG feeds.  Overnight, per Dr. 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 Soft tissue mass at the inferior aspect of the left SI joint with associated sclerotic changes is worrisome for a primary spur, or metastasis of the none known primary. 2. No evidence for aspiration pneumonia. 3. Status post partial colectomy and colostomy. 4. Gastrostomy  tube in place. The jejunal tube is not X of the stomach. 5. Atherosclerosis. 6. Degenerative changes of the lumbar spine.   EEG pending  Antibiotics:  Levaquin 10/29  Zosyn 10/29   Subjective  Much more alert and oriented.  Seems to be at his regular baseline. Engaging in conversation and  Asking to eat and drink   Objective    Interim History: Overnight events noted  Telemetry: Currently sinus rhythm in the 70s   Objective: Filed Vitals:   02/13/12 0953 02/13/12 2100 02/14/12 0500 02/14/12 1453  BP: 92/58 102/60 132/86 115/71  Pulse:  66 96 66  Temp:  97.4 F (36.3 C) 97.5 F (36.4 C) 97.9 F (36.6 C)  TempSrc:  Oral Axillary   Resp:    20  Height:      Weight:      SpO2:  100% 100% 98%    Intake/Output Summary (Last 24 hours) at 02/14/12 1907 Last data filed at 02/14/12 1828  Gross per 24 hour  Intake   1745 ml  Output   1750 ml  Net     -5 ml    Exam:  General: Alert and African American male, arcus senilis present, no icterus or pallor, no specific deviation of the mouth. Patient has a facial tic Cardiovascular: S1-S2 irregular rhythm Respiratory: Clinically clear, no added sound Abdomen: Soft nontender colostomy in left side of abdomen with stool in it, feeding tube present in in situ Skin sacral decubitus not examined today.  Neuro moving limbs however is definitely weaker on the right upper extremity and left lower extremity. Sensation seems intact, GCS is about 12  Data Reviewed: Basic Metabolic Panel:  Lab 02/14/12 3086 02/12/12 1638  NA 145 140  K 3.6 4.7  CL 111 98  CO2 24 23  GLUCOSE 126* 306*  BUN 37* 58*  CREATININE 1.27 1.21  CALCIUM 9.3 10.8*  MG -- --  PHOS -- --   Liver Function Tests:  Lab 02/14/12 0640  AST 20  ALT 12  ALKPHOS 71  BILITOT 0.3  PROT 7.6  ALBUMIN 2.1*   No results found for this basename: LIPASE:5,AMYLASE:5 in the last 168 hours No results found for this basename: AMMONIA:5 in the last 168  hours CBC:  Lab 02/14/12 0640 02/12/12 1638  WBC 6.0 12.4*  NEUTROABS -- 8.8*  HGB 9.9* 11.0*  HCT 31.9* 34.8*  MCV 79.2 78.2  PLT 301 379   Cardiac Enzymes: No results found for this basename: CKTOTAL:5,CKMB:5,CKMBINDEX:5,TROPONINI:5 in the last 168 hours BNP: No components found with this basename: POCBNP:5 CBG:  Lab 02/14/12 1607 02/14/12 1121 02/14/12 0726 02/14/12 0510 02/14/12 0041  GLUCAP 103* 156* 121* 109* 108*    Recent Results (from the past 240 hour(s))  URINE CULTURE     Status: Normal   Collection Time   02/12/12  5:20 PM      Component Value Range Status Comment   Specimen Description URINE, RANDOM   Final    Special Requests NONE   Final    Culture  Setup Time 02/12/2012 18:02   Final    Colony Count 75,000 COLONIES/ML   Final    Culture     Final    Value: Multiple bacterial morphotypes present, none predominant. Suggest appropriate recollection if clinically indicated.   Report Status 02/13/2012 FINAL   Final   CULTURE, BLOOD (ROUTINE X 2)     Status: Normal (Preliminary result)   Collection Time   02/12/12  5:48 PM      Component Value Range Status Comment   Specimen Description BLOOD HAND LEFT   Final    Special Requests BOTTLES DRAWN AEROBIC ONLY 7CC   Final    Culture  Setup Time 02/12/2012 23:05   Final    Culture     Final    Value:        BLOOD CULTURE RECEIVED NO GROWTH TO DATE CULTURE WILL BE HELD FOR 5 DAYS BEFORE ISSUING A FINAL NEGATIVE REPORT   Report Status PENDING   Incomplete   MRSA PCR SCREENING     Status: Normal   Collection Time   02/12/12 11:58 PM      Component Value Range Status Comment   MRSA by PCR NEGATIVE  NEGATIVE Final   CULTURE, BLOOD (ROUTINE X 2)     Status: Normal (Preliminary result)   Collection Time   02/13/12 12:40 AM      Component Value Range Status Comment   Specimen Description BLOOD RIGHT HAND   Final    Special Requests BOTTLES DRAWN AEROBIC ONLY 5CC   Final    Culture  Setup Time 02/13/2012 04:07    Final    Culture     Final    Value:        BLOOD CULTURE RECEIVED NO GROWTH TO DATE CULTURE WILL BE HELD FOR 5 DAYS BEFORE ISSUING A FINAL NEGATIVE REPORT   Report Status PENDING   Incomplete      Studies:              All Imaging reviewed and is as per above notation   Scheduled Meds:    . acetaminophen  1,000 mg Intravenous Q6H  . antiseptic oral rinse  15 mL Mouth Rinse  q12n4p  . aspirin  325 mg Oral Daily  . atropine  2 drop Sublingual Q6H  . chlorhexidine  15 mL Mouth Rinse BID  . citalopram  10 mg Per Tube Daily  . docusate  100 mg Oral BID  . famotidine (PEPCID) IV  20 mg Intravenous Q12H  . ferrous sulfate  300 mg Per Tube BID  . gabapentin  300 mg Per Tube QHS  . heparin  5,000 Units Subcutaneous Q8H  . insulin aspart  0-9 Units Subcutaneous Q4H  . levothyroxine  300 mcg Per Tube QAC breakfast  . liothyronine  25 mcg Per Tube QAC breakfast  . multivitamin  5 mL Per Tube Daily  . piperacillin-tazobactam (ZOSYN)  IV  3.375 g Intravenous Q8H  . saccharomyces boulardii  250 mg Oral BID  . sennosides  10 mL Oral BID  . sodium chloride  3 mL Intravenous Q12H  . thiamine  100 mg Per Tube BH-q7a  . traZODone  25 mg Oral QHS  . vancomycin  1,000 mg Intravenous Q24H  . vitamin C  500 mg Per Tube BID   Continuous Infusions:    . sodium chloride 125 mL/hr at 02/13/12 1900     Assessment/Plan: 1. Sepsis, likely severe and from a urinary cause-CT scan does not confirm pneumonia, urine culture from 10/31 is pending. Continue broad-spectrum Zosyn, vancomysin and discontinue levofloxacin. Continue IV fluid repletion at 125 cc per hour. Blood pressures are still borderline low however he does have a potential history of heart failure therefore we will need to be careful with how much fluid we gave him. He may have boluses of 500 cc if his fluid sustained below 90/50. He has had no spike of temperature overnight. 2. Acute toxic metabolic encephalopathy-probably related to  #1-resolving 3. Acute kidney injury/acute renal failure secondary to sepsis-BUN/creatinine has begun to trend down, coinciding with his improved mental status 4. History of infective endocarditis in 01/2011-EF 60-65 % 02/03/12 Grade 2 diast dysfunction-continue cautious IVF repletion 5. History of CVA-supportive therapy. Continue aspirin 325 mg daily 6. History of hypothyroidism-continue levothyroxine 300 mcg daily, Cytomel 25 mcg daily.  7.  Diabetes mellitus-continue sliding scale coverage-patient is n.p.o. 8. ? History glaucoma patient gets atropine drops 1% ophthalmic 2 drops every 6 hourly 9. History sacral decubitus-mattress overlay-we will look at the wounds probably when the patient has been changed 10. History depression-continue Celexa suspension 10 mg daily. 11. History hyperlipidemia-will discontinue Zocor at present time given poor likely outcome  Code Status:  DNR/DNI confirmed at bedside  Family Communication:  Patient much better-will coordinate with speech therapist to determine best diet given he might also suffer from aspiration-PT/OT to see patient Disposition Plan:  telemetry    Pleas Koch, MD  [facility] Pager [phone] 02/14/2012, 7:07 PM    LOS: 2 days

## 2012-02-14 NOTE — Progress Notes (Addendum)
Patient ZO:XWRUEA THOS [name]      DOB: 06/05/1935      VWU:[id number]   Palliative Medicine Team at [facility] Progress Note    Subjective:  Patient even better than yesterday.  Speaking in complete, articulate , complex sentences.  Less facial grimacing,  But not completely free of tic.  Patient expresses concern over not having a diet states he was eating before and he wants to resume eating.  When asked how he felt about the current medical treatments being rendered , he states "I am feeling better, and would want them to continue".     Filed Vitals:   02/14/12 1453  BP: 115/71  Pulse: 66  Temp: 97.9 F (36.6 C)  Resp: 20   Physical exam:  General: no acute distress,,  Speech clearer,   PERRL, EOMI, mouth dry, thick whitish sputum Chest; decreased without rhonchi , rales or wheezes CVS: Regular, rate and rhythm  Abdomen:  Soft, not tender,  PEG in place Ext: trace edema,   Neuro: more alert, oriented able to articulate needs. Lab Results  Component Value Date   WBC 6.0 02/14/2012   HGB 9.9* 02/14/2012   HCT 31.9* 02/14/2012   MCV 79.2 02/14/2012   PLT 301 02/14/2012   Lab Results  Component Value Date   CREATININE 1.27 02/14/2012   BUN 37* 02/14/2012   NA 145 02/14/2012   K 3.6 02/14/2012   CL 111 02/14/2012   CO2 24 02/14/2012    Assessment and plan: 76 year old African [place] male with history of stroke with subsequent hemiparesis and debility.  Presented with altered mental status, evidence for UTI.  Transient hypotension, responsive to fluids and antibiotics.  Family requesting status quo with current treatments, awaiting PSA.  Patient's wife not present plan to discuss full picture when she is available.   1. DNR/DNI  2. Dysphagia:  D/W Dr. Mahala Menghini will order SLP eval.  3.  Urinary Tract infection continue antibiotics for now.  4. Back pain : patient reports none at present use prn meds.  5..  Disposition:  Possibly back to SNF with PCS vs full comfort  care with consideration for hospice .   If he continues to improve but elects comfort feeding with no further consideration for curative care.   Time In Time Out Total Time Spent with Patient Total Overall Time  230 pm  300 pm 25 min 30 min  Keith Beach L. Ladona Ridgel, MD MBA The Palliative Medicine Team at [facility] Phone: [phone] Pager: [phone]

## 2012-02-14 NOTE — Care Management Note (Unsigned)
    Page 1 of 1   02/14/2012     3:45:39 PM   CARE MANAGEMENT NOTE 02/14/2012  [name], [name]   Account Number:  [id number]  Date Initiated:  02/14/2012  Documentation initiated by:  GRAVES-BIGELOW,Valgene Deloatch  Subjective/Objective Assessment:   Pt adms itted with AMS. Treating for UTI IV ABX therapy. Pt is from [facility]. Has family support.     Action/Plan:   CSW is working with family for disposition needs. CM will continue to monitor for disposition needs.   Anticipated DC Date:  02/18/2012   Anticipated DC Plan:  ASSISTED LIVING / REST HOME      DC Planning Services  CM consult      Choice offered to / List presented to:             Status of service:  In process, will continue to follow Medicare Important Message given?   (If response is "NO", the following Medicare IM given date fields will be blank) Date Medicare IM given:   Date Additional Medicare IM given:    Discharge Disposition:    Per UR Regulation:  Reviewed for med. necessity/level of care/duration of stay  If discussed at Long Length of Stay Meetings, dates discussed:    Comments:

## 2012-02-14 NOTE — Clinical Social Work Psychosocial (Signed)
     Clinical Social Work Department BRIEF PSYCHOSOCIAL ASSESSMENT 02/14/2012  [name], [name]     Account Number:  [id number]     Admit date:  02/12/2012  Clinical Social Worker:  Margaree Mackintosh  Date/Time:  02/14/2012 12:00 M  Referred by:  Physician  Date Referred:  02/14/2012 Referred for  SNF Placement   Other Referral:   Interview type:  Patient Other interview type:   with family present.    PSYCHOSOCIAL DATA Living Status:  FACILITY Admitted from facility:  [facility] Level of care:  Skilled Nursing Facility Primary support name:  Wilma: [phone] Primary support relationship to patient:  SPOUSE Degree of support available:   Unknown.    CURRENT CONCERNS Current Concerns  Post-Acute Placement   Other Concerns:    SOCIAL WORK ASSESSMENT / PLAN Clinical Social Worker recieved referral indicating pt is from a SNF.  CSW reviewed chart and met with pt and family at bedside.  CSW introduced self, explained role, and provided support.  Pt confirmed plan to return to [facility] at dc.  CSW reviewed SNF process.  CSW contacted [facility] to confirm pt is able to return.  CSW staffed case with MD.  CSW to continue to follow and assist as needed.   Assessment/plan status:  Information/Referral to [facility] Other assessment/ plan:   Information/referral to community resources:   SNF.    PATIENTS/FAMILYS RESPONSE TO PLAN OF CARE: Pt and family were pleasant and engaged in conversation. Pt and family thanked CSW for intervention.

## 2012-02-14 NOTE — Consult Note (Addendum)
WOC consult Note Reason for Consult: eval sacral pressure ulcer.  Pt DNR from SNF. Pt and wife report that he is followed by the [facility], Dr. Cheryll Cockayne. Pt has had diverting ostomy to allow his pressure ulcer to heal. He has LALM at the facility. Pt reports they are doing wound care every other day in the SNF.  Noted to have G-tube, that he reports for supplemental nutrition and he has being eating regular food there. He seems very coherent at this time and request something to drink.  Hospitalist in at the time of my assessment, does not appear that pressure ulcer is the source of any infection.  Bil. Heels are intact, but pt does not turn or reposition himself at all.  Wound type: Stage IV pressure ulcer-sacrum, palpable bone but not exposed Pressure Ulcer POA: Yes Measurement: 3.5cm x 2.0cm x 3.5 cm Wound bed: 100% pink, with some minimal granulation tissue Drainage (amount, consistency, odor) moderate, serosanguinous, no odor Periwound: scarring and reepithelialization some maceration of the periwound edges  Dressing procedure/placement/frequency: will order normal saline moist to moist dressings.  Add Prevalon boots to protect heels while in bed.  Air mattress in place.   Gtube has some moderate yellow- green drainage but I do not think this is problematic.  Does have some beginnings of device related pressure wounds at this site, I would suggest thinner dressing here to prevent this from worsening.   WOC ostomy Stoma type/location: LLQ, colostomy Stomal assessment/size: 2 1/2" round, and easily prolapse with turning to his left side, pale pink, and moist. However stoma is very flush when pt completely supine Peristomal assessment: intact with some mild maceration from 5-7 o'clock  Treatment options for stomal/peristomal skin:  Added 2" barrier ring around stoma, due to the fact it is sometimes flush with the skin and to protect the macerated skin.  Output green pasty stool and  flatus fill pouch, which I emptied at bedside. No concern with color or consistency of stool  Ostomy pouching: 1 pc flat used, and 2" barrier stretched to fit around stoma.  WOC will follow along with you for assistance with ostomy and wound needs Ruslan Mccabe [facility] RN,CWOCN [phone]

## 2012-02-15 LAB — GLUCOSE, CAPILLARY
Glucose-Capillary: 133 mg/dL — ABNORMAL HIGH (ref 70–99)
Glucose-Capillary: 89 mg/dL (ref 70–99)

## 2012-02-15 LAB — URINE CULTURE

## 2012-02-15 MED ORDER — INSULIN ASPART 100 UNIT/ML ~~LOC~~ SOLN
0.0000 [IU] | Freq: Three times a day (TID) | SUBCUTANEOUS | Status: DC
  Administered 2012-02-16 (×2): 2 [IU] via SUBCUTANEOUS

## 2012-02-15 MED ORDER — LEVOFLOXACIN 500 MG PO TABS
500.0000 mg | ORAL_TABLET | Freq: Every day | ORAL | Status: DC
  Administered 2012-02-15 – 2012-02-16 (×2): 500 mg via ORAL
  Filled 2012-02-15 (×2): qty 1

## 2012-02-15 MED ORDER — JEVITY 1.2 CAL PO LIQD
1000.0000 mL | ORAL | Status: DC
  Administered 2012-02-15: 1000 mL
  Filled 2012-02-15: qty 1000

## 2012-02-15 MED ORDER — JEVITY 1.2 CAL PO LIQD
1000.0000 mL | ORAL | Status: DC
  Administered 2012-02-15: 15:00:00
  Filled 2012-02-15 (×4): qty 1000

## 2012-02-15 MED ORDER — ZINC SULFATE 220 (50 ZN) MG PO CAPS
220.0000 mg | ORAL_CAPSULE | Freq: Every day | ORAL | Status: DC
  Administered 2012-02-15 – 2012-02-16 (×2): 220 mg via ORAL
  Filled 2012-02-15 (×2): qty 1

## 2012-02-15 MED ORDER — INSULIN GLARGINE 100 UNIT/ML ~~LOC~~ SOLN
5.0000 [IU] | Freq: Every day | SUBCUTANEOUS | Status: DC
  Administered 2012-02-15: 5 [IU] via SUBCUTANEOUS

## 2012-02-15 NOTE — Evaluation (Signed)
Clinical/Bedside Swallow Evaluation Patient Details  Name: [name] MRN: [id number] Date of Birth: 1936/03/03  Today's Date: 02/15/2012 Time: 0900-0945 SLP Time Calculation (min): 45 min  Past Medical History:  Past Medical History  Diagnosis Date  . Stroke     Hx of with right hemiparesis  . Hypertension   . Endocarditis, mitral valve, syphilitic   . Hypothyroidism   . Anorexia   . Dysphagia   . Reflux   . Bacteremia   . Clostridium difficile colitis   . CVA (cerebral vascular accident)   . Pressure ulcer(707.0)   . Diabetes mellitus, type 2   . Anemia   . Stroke    Past Surgical History:  Past Surgical History  Procedure Date  . Colostomy   . Peg placement   . Hernia repair    HPI:  76 yr old AAM admitted with likely Sepsis 2/2 UTI  Presented to [facility] with ?likely severe sepsis potentially of Urinary/Aspiration causes, although CXR negative for aspiration.  Has a h/o multiple co-morbidities inclusive of CVA in the past, has a h/o diverting colostomy and PEG feeds.  Most recent MBS was 01/23/11 which recommended NPO due to severe dysphagia. Since that time, patient has resumed a soft solid diet with thin liquids per patient and son stating that therapist told him there was not reason why he could not resume a po diet. For the past 2 months however, patient has been relying on PEG for nutrition and consuming thin liquids only stating that he has no craving for solid foods. Current BSE ordered to determine ability to resume a po diet.     Assessment / Plan / Recommendation Clinical Impression  Bedside swallow evaluation complete. Patient presents with clinical indication of a severe oropharyngeal dysphagia with similar function to that reported during MBS 1 year ago.  Hyo-laryngeal elevation sluggish with multiple swallows per bite and sip suggestive of pharyngeal residuals and delayed wet vocal quality, throat clearing, and cough indicative of decreased airway protection.   Vocal quality occasionally wet at baseline as well, likely representative of periodic aspiration of secretions. Discussed results with patient and son.  Current options including proceeding with MBS to determine extent and origin of dysphagia, acknowledging that results will likely prove unfavorable of po intake,  vs continuing pos with known risk of aspiration. Patient very clearly stated that he wishes to continue liquids (thin) with known risk of aspiration and to receive primary nutrition via PEG tube. Son and wife who was informed via phone verbalized understanding.  Educated patient and son regarding general safe swallowing precautions including oral care before and after intake to decrease but not eliminate the chances of an aspiration related infection.  SLP will f/u briefly for any further needed education.     Aspiration Risk  Severe    Diet Recommendation Thin liquid (full liquids)   Liquid Administration via: Cup;Straw Medication Administration: Via alternative means Supervision: Patient able to self feed;Full supervision/cueing for compensatory strategies (provide assistance with feeding as needed) Compensations: Slow rate;Small sips/bites;Multiple dry swallows after each bite/sip Postural Changes and/or Swallow Maneuvers: Seated upright 90 degrees;Upright 30-60 min after meal    Other  Recommendations Oral Care Recommendations: Oral care before and after PO   Follow Up Recommendations  None    Frequency and Duration min 2x/week  2 weeks   Pertinent Vitals/Pain n/a    SLP Swallow Goals Patient will utilize recommended strategies during swallow to increase swallowing safety with: Modified independent assistance Swallow Study  Goal #2 - Progress: Not met   Swallow Study Prior Functional Status  Type of Home: Skilled Nursing Facility [facility] Available Help at Discharge: Skilled Nursing Facility Vocation: Retired    General HPI: 76 yr old AAM admitted with likely  Sepsis 2/2 UTI  Presented to [facility] with ?likely severe sepsis potentially of Urinary/Aspiration causes, although CXR negative for aspiration.  Has a h/o multiple co-morbidities inclusive of CVA in the past, has a h/o diverting colostomy and PEG feeds.  Most recent MBS was 01/23/11 which recommended NPO due to severe dysphagia. Since that time, patient has resumed a soft solid diet with thin liquids per patient and son stating that therapist told him there was not reason why he could not resume a po diet. For the past 2 months however, patient has been relying on PEG for nutrition and consuming thin liquids only stating that he has no craving for solid foods. Current BSE ordered to determine ability to resume a po diet.   Type of Study: Bedside swallow evaluation Previous Swallow Assessment: see clinical impression statement Diet Prior to this Study: NPO;PEG tube (tube feeds have not yet been started) Temperature Spikes Noted: No Respiratory Status: Room air History of Recent Intubation: No Behavior/Cognition: Alert;Cooperative;Pleasant mood Oral Cavity - Dentition: Adequate natural dentition Self-Feeding Abilities: Needs assist Patient Positioning: Upright in bed Baseline Vocal Quality: Other (comment) (nasal) Volitional Cough: Strong Volitional Swallow: Able to elicit    Oral/Motor/Sensory Function Overall Oral Motor/Sensory Function: Impaired Velum:  (decreased elevation)   Ice Chips Ice chips: Impaired Presentation: Spoon Pharyngeal Phase Impairments: Decreased hyoid-laryngeal movement (multiple swallows)   Thin Liquid Thin Liquid: Impaired Presentation: Straw Pharyngeal  Phase Impairments: Suspected delayed Swallow;Decreased hyoid-laryngeal movement;Multiple swallows;Wet Vocal Quality;Throat Clearing - Delayed;Cough - Delayed    Nectar Thick Nectar Thick Liquid: Not tested   Honey Thick Honey Thick Liquid: Not tested   Puree Puree: Not tested   Solid   GO   Keith Notaro MA,  CCC-SLP [phone]  Solid: Not tested       [name] 02/15/2012,10:39 AM

## 2012-02-15 NOTE — Progress Notes (Signed)
Clinical Social Worker received call from [place] at [facility]: Authorization number for pt to return to facility tomorrow:  [id number]    Angelia Mould, MSW, Theresia Majors [phone]

## 2012-02-15 NOTE — Evaluation (Signed)
Occupational Therapy Evaluation Patient Details Name: [name] MRN: [id number] DOB: [birth year] Today's Date: 02/15/2012 Time: 4098-1191 OT Time Calculation (min): 37 min  OT Assessment / Plan / Recommendation Clinical Impression  Pt admitted with sepsis and sacral wound. Pt with a history of CVA and R sided hemiplegia. Pt has been bedbound for over a year living at [facility]. Pt recently moved SNF's and has been lifted to a chair and able to sit up in [facility] prior to admission. Pt will benefit from skilled OT in the aute setting to maximize I with ADL and ADL mobility prior to d/c.    OT Assessment  Patient needs continued OT Services    Follow Up Recommendations  Skilled nursing facility    Barriers to Discharge      Equipment Recommendations  None recommended by PT;None recommended by OT    Recommendations for Other Services    Frequency  Min 2X/week    Precautions / Restrictions Precautions Precautions: Fall Restrictions Weight Bearing Restrictions: No   Pertinent Vitals/Pain Pt reports LUE pain as well as sacral wound pain but did not rate; RN aware. Pt repositioned for pain relief    ADL  Eating/Feeding: NPO Grooming: Maximal assistance Where Assessed - Grooming: Supported sitting Upper Body Bathing: Maximal assistance Where Assessed - Upper Body Bathing: Supported sitting Lower Body Bathing: +2 Total assistance Lower Body Bathing: Patient Percentage: 30% (pt able to assist with rolling) Where Assessed - Lower Body Bathing: Rolling right and/or left Lower Body Dressing: +1 Total assistance Where Assessed - Lower Body Dressing: Supine, head of bed up Transfers/Ambulation Related to ADLs: ambulation NT  ADL Comments: Pt has been non-ambulatory x 2 years, but states ambulation is a goal of his. Encouraged pt to pick smaller, more attainable goals at this time.    OT Diagnosis: Generalized weakness;Acute pain;Hemiplegia dominant side  OT Problem List: Decreased  strength;Decreased range of motion;Decreased activity tolerance;Impaired balance (sitting and/or standing);Decreased coordination;Decreased knowledge of use of DME or AE;Decreased knowledge of precautions;Cardiopulmonary status limiting activity;Pain;Impaired UE functional use;Increased edema OT Treatment Interventions: Self-care/ADL training;DME and/or AE instruction;Therapeutic activities;Patient/family education;Balance training   OT Goals Acute Rehab OT Goals OT Goal Formulation: With patient Time For Goal Achievement: 02/29/12 Potential to Achieve Goals: Fair ADL Goals Pt Will Perform Grooming: with min assist;Supported;Sitting at sink ADL Goal: Grooming - Progress: Goal set today Pt Will Perform Upper Body Bathing: with min assist;Sitting at sink;Sitting, edge of bed ADL Goal: Upper Body Bathing - Progress: Goal set today Pt Will Perform Upper Body Dressing: with mod assist;Sitting, bed ADL Goal: Upper Body Dressing - Progress: Goal set today Additional ADL Goal #1: Pt will be Mod A with all bed mobility to assist in bed level ADLs. ADL Goal: Additional Goal #1 - Progress: Goal set today Additional ADL Goal #2: Pt will perform static sitting, EOB with Min A greater than or equal to in prep for seated ADLs. 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, government Patient Stated Goal: Walk again   Prior Functioning     Home Living Available Help at Discharge: Skilled Nursing Facility Type of Home: Skilled Nursing Facility [facility]) Prior Function Level of Independence: Needs assistance Needs Assistance: Bathing;Dressing;Feeding;Grooming;Toileting;Meal Prep;Transfers Bath: Maximal Dressing: Total Feeding: Maximal Grooming: Maximal Toileting: Total Meal Prep: Total Transfer Assistance: pt's son  reports facility  uses lift Able to Take Stairs?: No Driving: No Vocation: Retired Musician: Expressive difficulties;HOH Dominant Hand: Right (now uses Lt for majority)         Vision/Perception Vision - Assessment Additional Comments: Will assess next session   Cognition  Overall Cognitive Status: Appears within functional limits for tasks assessed/performed (Simultaneous filing. User may not have seen previous data.) Arousal/Alertness: Awake/alert (Simultaneous filing. User may not have seen previous data.) Orientation Level: Appears intact for tasks assessed (Simultaneous filing. User may not have seen previous data.) Behavior During Session: [facility] for tasks performed (Simultaneous filing. User may not have seen previous data.) Cognition - Other Comments: pt with difficulty following one step commands. Also, he would change his answert (e.g. with the date) although they were correct the first time and required cues to verbalize the month- will continue to assess.    Extremity/Trunk Assessment Right Upper Extremity Assessment RUE ROM/Strength/Tone: Deficits;Unable to fully assess;Due to pain RUE ROM/Strength/Tone Deficits: appears to have elbow contracture? and extensor tone- limited testing due to pain Left Upper Extremity Assessment LUE ROM/Strength/Tone: Deficits;Unable to fully assess;Due to pain LUE ROM/Strength/Tone Deficits: Elbow flexion/extension WFL; limited shoulder, wrist and digit flexion Right Lower Extremity Assessment RLE ROM/Strength/Tone: Deficits RLE ROM/Strength/Tone Deficits: PROM 0-85 degrees. Increased extensor tone with passive flexion. No active strength Left Lower Extremity Assessment LLE ROM/Strength/Tone: Deficits LLE ROM/Strength/Tone Deficits: PROM 0-85 degrees. Minimal active movement, although able to perform SLR for ~30 degrees and heel slide for minimal range LLE Sensation: WFL - Light Touch     Mobility Bed Mobility Bed  Mobility: Rolling Right;Rolling Left;Supine to Sit;Sitting - Scoot to [place] of Bed;Sit to Supine Rolling Right: 1: +2 Total assist Rolling Right: Patient Percentage: 20% Rolling Left: 1: +2 Total assist Rolling Left: Patient Percentage: 0% Supine to Sit: 1: +2 Total assist Supine to Sit: Patient Percentage: 10% Sitting - Scoot to Edge of Bed: 1: +2 Total assist Sitting - Scoot to Edge of Bed: Patient Percentage: 0% Sit to Supine: 1: +2 Total assist Sit to Supine: Patient Percentage: 0% Details for Bed Mobility Assistance: VC for sequencing throughout. Pt able to assist with rolling to the left using LUE. R side in extensor tone with movement.      Shoulder Instructions     Exercise     Balance Balance Balance Assessed: Yes Static Sitting Balance Static Sitting - Balance Support: Bilateral upper extremity supported;Feet supported Static Sitting - Level of Assistance: 1: +2 Total assist Static Sitting - Comment/# of Minutes: +2 Total 30%. Assist x 1 in front and assist with maintaining stabilization on the left due to left lateral lean as well as support posteriorly. See below for dynamic balance activities Dynamic Sitting Balance Dynamic Sitting - Balance Support: Bilateral upper extremity supported;Feet supported Dynamic Sitting - Level of Assistance: 1: +2 Total assist Dynamic Sitting Balance - Compensations: Pt with posterior left lateral lean Dynamic Sitting - Balance Activities: Lateral lean/weight shifting;Forward lean/weight shifting Dynamic Sitting - Comments: Pt at EOB with 2 assist for safety and support. Pt given cues for posterior/anterior movements as well as L/R movements. Pt able to bring himself to the right and control movement, required assistance to get back to midline and controlled to the left. Pt required mod assist for anterior movement of trunk, no stability posteriorly.   End of Session OT - End of Session Activity Tolerance: Patient limited by pain;Patient  limited by fatigue Patient left: in bed;with call bell/phone within reach;with family/visitor present Nurse Communication: Mobility status  GO     Jadore Veals 02/15/2012, 10:24 AM

## 2012-02-15 NOTE — Progress Notes (Addendum)
PROGRESS NOTE  [name] ZOX:[id number] DOB: [birth year] DOA: 02/12/2012 PCP: No primary provider on file.  Brief narrative: 76 yr old AAM admitted with likely Sepsis 2/2 UTI.  Presented to [facility] with ? likely severe sepsis potentially of Urinary/Aspiartion causes.  Has a h/o multiple co-morbidities inclusive of CVA in the past, has a h/o diverting colostomy and PEG feeds.  It was initially thought that the patient had severe sepsis and he was made a DNR/DNI on admit.    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 Soft tissue mass at the inferior aspect of the left SI joint with associated sclerotic changes is worrisome for a primary spur, or metastasis of the none known primary. 2. No evidence for aspiration pneumonia. 3. Status post partial colectomy and colostomy. 4. Gastrostomy tube in place. The jejunal tube is not X of the stomach. 5. Atherosclerosis. 6. Degenerative changes of the lumbar spine.   EEG pending  Antibiotics:  Levaquin  10/29>>11/1, narrowed to orals to d/c on 02/24/12  Zosyn 10/29>>11/1   Subjective  Engaging in conversation and asking to eat and drink. Feels much better. Wishes to eat, and drink Speech has seen him today   Objective    Interim History: Overnight events noted SLP-stated has severe risk for aspiration-Thin liquid (full liquids) Liquid Administration via: Cup;Straw  Medication Administration: Via alternative means Supervision: Patient able to self feed;Full supervision/cueing for compensatory strategies (provide assistance with feeding as needed) Compensations: Slow rate;Small sips/bites;Multiple dry swallows after each bite/sip Postural Changes and/or Swallow Maneuvers: Seated upright 90 degrees;Upright 30-60 min after meal    Telemetry: Currently sinus rhythm in the 70s   Objective: Filed Vitals:   02/14/12 1453 02/14/12 2030 02/15/12 0508 02/15/12 1404  BP: 115/71 134/73 117/69 170/79  Pulse: 66 60 71 69  Temp: 97.9 F (36.6 C) 98.8 F (37.1 C) 98.5 F (36.9 C) 97.8 F (36.6 C)  TempSrc:  Axillary Axillary Axillary  Resp: 20 18 18    Height:      Weight:      SpO2: 98% 100% 98%     Intake/Output Summary (Last 24 hours) at 02/15/12 1433 Last data filed at 02/15/12 1113  Gross per 24 hour  Intake    480 ml  Output   1800 ml  Net  -1320 ml    Exam:  General: Alert and African American male, arcus senilis present, no icterus or pallor, no specific deviation of the mouth-thick secretions Patient has a facial tic Cardiovascular: S1-S2 irregular rhythm Respiratory: Clinically clear, no added sound Abdomen: Soft nontender colostomy in left side of abdomen with stool in it, feeding tube present in in situ Skin sacral  decubitus not examined today. Lower ext and toes seem dry, pulses intact Neuro moving limbs however is definitely weaker on the right upper extremity and left lower extremity. Sensation seems intact, GCS is about 15  Data Reviewed: Basic Metabolic  Panel:  Lab 02/14/12 0640 02/12/12 1638  NA 145 140  K 3.6 4.7  CL 111 98  CO2 24 23  GLUCOSE 126* 306*  BUN 37* 58*  CREATININE 1.27 1.21  CALCIUM 9.3 10.8*  MG -- --  PHOS -- --   Liver Function Tests:  Lab 02/14/12 0640  AST 20  ALT 12  ALKPHOS 71  BILITOT 0.3  PROT 7.6  ALBUMIN 2.1*   No results found for this basename: LIPASE:5,AMYLASE:5 in the last 168 hours No results found for this basename: AMMONIA:5 in the last 168 hours CBC:  Lab 02/14/12 0640 02/12/12 1638  WBC 6.0 12.4*  NEUTROABS -- 8.8*  HGB 9.9* 11.0*  HCT 31.9* 34.8*  MCV 79.2 78.2  PLT 301 379   Cardiac Enzymes: No results found for this basename: CKTOTAL:5,CKMB:5,CKMBINDEX:5,TROPONINI:5 in the last 168 hours BNP: No components found with this basename: POCBNP:5 CBG:  Lab 02/15/12 1159 02/15/12 0359 02/15/12 0007 02/14/12 2008 02/14/12 1607  GLUCAP 118* 123* 89 106* 103*    Recent Results (from the past 240 hour(s))  URINE CULTURE     Status: Normal   Collection Time   02/12/12  5:20 PM      Component Value Range Status Comment   Specimen Description URINE, RANDOM   Final    Special Requests NONE   Final    Culture  Setup Time 02/12/2012 18:02   Final    Colony Count 75,000 COLONIES/ML   Final    Culture     Final    Value: Multiple bacterial morphotypes present, none predominant. Suggest appropriate recollection if clinically indicated.   Report Status 02/13/2012 FINAL   Final   CULTURE, BLOOD (ROUTINE X 2)     Status: Normal (Preliminary result)   Collection Time   02/12/12  5:48 PM      Component Value Range Status Comment   Specimen Description BLOOD HAND LEFT   Final    Special Requests BOTTLES DRAWN AEROBIC ONLY [facility]   Final    Culture  Setup Time 02/12/2012 23:05   Final    Culture     Final    Value:        BLOOD CULTURE RECEIVED NO GROWTH TO DATE CULTURE WILL BE HELD FOR 5 DAYS BEFORE ISSUING A FINAL NEGATIVE REPORT   Report Status PENDING   Incomplete   MRSA PCR SCREENING      Status: Normal   Collection Time   02/12/12 11:58 PM      Component Value Range Status Comment   MRSA by PCR NEGATIVE  NEGATIVE Final   CULTURE, BLOOD (ROUTINE X 2)     Status: Normal (Preliminary result)   Collection Time   02/13/12 12:40 AM      Component Value Range Status Comment   Specimen Description BLOOD RIGHT HAND   Final    Special Requests BOTTLES DRAWN AEROBIC ONLY 5CC   Final    Culture  Setup Time 02/13/2012 04:07   Final    Culture     Final    Value:        BLOOD CULTURE RECEIVED NO GROWTH TO DATE CULTURE WILL BE HELD FOR 5 DAYS BEFORE ISSUING A FINAL NEGATIVE REPORT   Report Status  PENDING   Incomplete   URINE CULTURE     Status: Normal   Collection Time   02/14/12  6:13 AM      Component Value Range Status Comment   Specimen Description URINE, RANDOM   Final    Special Requests NONE   Final    Culture  Setup Time 02/14/2012 14:52   Final    Colony Count NO GROWTH   Final    Culture NO GROWTH   Final    Report Status 02/15/2012 FINAL   Final      Studies:              All Imaging reviewed and is as per above notation   Scheduled Meds:    . antiseptic oral rinse  15 mL Mouth Rinse q12n4p  . aspirin  325 mg Oral Daily  . atropine  2 drop Sublingual Q6H  . chlorhexidine  15 mL Mouth Rinse BID  . citalopram  10 mg Per Tube Daily  . docusate  100 mg Oral BID  . famotidine (PEPCID) IV  20 mg Intravenous Q12H  . ferrous sulfate  300 mg Per Tube BID  . gabapentin  300 mg Per Tube QHS  . heparin  5,000 Units Subcutaneous Q8H  . insulin aspart  0-9 Units Subcutaneous Q4H  . levothyroxine  300 mcg Per Tube QAC breakfast  . liothyronine  25 mcg Per Tube QAC breakfast  . multivitamin  5 mL Per Tube Daily  . piperacillin-tazobactam (ZOSYN)  IV  3.375 g Intravenous Q8H  . saccharomyces boulardii  250 mg Oral BID  . sennosides  10 mL Oral BID  . sodium chloride  3 mL Intravenous Q12H  . thiamine  100 mg Per Tube BH-q7a  . traZODone  25 mg Oral QHS  .  vancomycin  1,000 mg Intravenous Q24H  . vitamin C  500 mg Per Tube BID   Continuous Infusions:    . sodium chloride 125 mL/hr at 02/15/12 0316  . feeding supplement (JEVITY 1.2 CAL) 1,000 mL (02/15/12 1344)     Assessment/Plan: 1. Sepsis, likely severe and from a urinary cause-CT scan does not confirm pneumonia, urine culture from 10/31 is pending. Continue broad-spectrum Zosyn, vancomycin-transition to PO Levaquin 500 for 02/24/12 2. Acute toxic metabolic encephalopathy-probably related to #1-resolving 3. Acute kidney injury/acute renal failure secondary to sepsis-BUN/creatinine has begun to trend down, co-inciding with his improved mental status 4. History of infective endocarditis in 01/2011-EF 60-65 % 02/03/12 Grade 2 diast dysfunction-IVF d/c 02/15/12 5. History of CVA-supportive therapy. Continue aspirin 325 mg daily 6. History of hypothyroidism-continue levothyroxine 300 mcg daily, Cytomel 25 mcg daily.  7.  Diabetes mellitus-continue sliding scale coverage--change to QIDACHS coverage 8. ? History glaucoma patient gets atropine drops 1% ophthalmic 2 drops every 6 hourly 9. History sacral decubitus-mattress overlay-we will look at the wounds probably when the patient has been changed-continue Vitamin 500 bid, add Zinc oxide 10. History depression-continue Celexa suspension 10 mg daily, Trazadone 25 qhs 11. History hyperlipidemia-will discontinue Zocor at present time given poor likely outcome  Code Status:  Patient requests a change of COde status to FULL CODE-Long extensive discussions re: ACLS protocols-He understands and wishes to have this done Family Communication:  Fully updated family Disposition Plan:  Telemetry, potential d/c back to SNF 02/16/12   Pleas Koch, MD  [facility] Pager [phone] 02/15/2012, 2:33 PM    LOS: 3 days

## 2012-02-15 NOTE — Progress Notes (Signed)
Nutrition Consult/Follow-up  Intervention:    Increase Jevity 1.2 formula by 10 ml every 4 hours to goal rate of 75 ml/hr to provide 2160 kcals, 100 gm protein, 1453 ml of free water  Continue liquid MVI daily via tube RD to follow for nutrition care plan  Assessment:   RD consult for EN initiation & management.  S/p bedside swallow evaluation this AM -- patient at severe aspiration risk.  Jevity 1.2 formula initiated at 15 ml/hr via PEG tube. RD spoke with RN at [facility] -- patient was receiving Diabetisource 1.2 formula at 50 ml/hr x 16 hours (from 1500-0700). Receiving liquid MVI daily via tube.  Diet Order:  NPO  Meds: Scheduled Meds:   . antiseptic oral rinse  15 mL Mouth Rinse q12n4p  . aspirin  325 mg Oral Daily  . atropine  2 drop Sublingual Q6H  . chlorhexidine  15 mL Mouth Rinse BID  . citalopram  10 mg Per Tube Daily  . docusate  100 mg Oral BID  . famotidine (PEPCID) IV  20 mg Intravenous Q12H  . ferrous sulfate  300 mg Per Tube BID  . gabapentin  300 mg Per Tube QHS  . heparin  5,000 Units Subcutaneous Q8H  . insulin aspart  0-9 Units Subcutaneous Q4H  . levothyroxine  300 mcg Per Tube QAC breakfast  . liothyronine  25 mcg Per Tube QAC breakfast  . multivitamin  5 mL Per Tube Daily  . piperacillin-tazobactam (ZOSYN)  IV  3.375 g Intravenous Q8H  . saccharomyces boulardii  250 mg Oral BID  . sennosides  10 mL Oral BID  . sodium chloride  3 mL Intravenous Q12H  . thiamine  100 mg Per Tube BH-q7a  . traZODone  25 mg Oral QHS  . vancomycin  1,000 mg Intravenous Q24H  . vitamin C  500 mg Per Tube BID   Continuous Infusions:   . sodium chloride 125 mL/hr at 02/15/12 0316  . feeding supplement (JEVITY 1.2 CAL) 1,000 mL (02/15/12 1344)   PRN Meds:.morphine injection, promethazine   CMP     Component Value Date/Time   NA 145 02/14/2012 0640   K 3.6 02/14/2012 0640   CL 111 02/14/2012 0640   CO2 24 02/14/2012 0640   GLUCOSE 126* 02/14/2012 0640   BUN 37*  02/14/2012 0640   CREATININE 1.27 02/14/2012 0640   CALCIUM 9.3 02/14/2012 0640   PROT 7.6 02/14/2012 0640   ALBUMIN 2.1* 02/14/2012 0640   AST 20 02/14/2012 0640   ALT 12 02/14/2012 0640   ALKPHOS 71 02/14/2012 0640   BILITOT 0.3 02/14/2012 0640   GFRNONAA 54* 02/14/2012 0640   GFRAA 63* 02/14/2012 0640    CBG (last 3)   Basename 02/15/12 1159 02/15/12 0359 02/15/12 0007  GLUCAP 118* 123* 89     Intake/Output Summary (Last 24 hours) at 02/15/12 1418 Last data filed at 02/15/12 1113  Gross per 24 hour  Intake    480 ml  Output   1800 ml  Net  -1320 ml    Weight Status:  95.3 kg (10/29) -- no new weight available  Estimated needs:  2000-2200 kcals, 100-110 gm protein  Nutrition Dx:  Inadequate Oral Intake r/t inability to eat as evidenced by NPO status, ongoing  Goal:  EN to meet >/= 90% of estimated nutrition needs, progressing  Monitor:  EN regimen & tolerance, weight, labs, I/O's  Kirkland Hun, RD, LDN Pager #: [phone] After-Hours Pager #: [phone]

## 2012-02-15 NOTE — Evaluation (Signed)
Physical Therapy Evaluation Patient Details Name: [name] MRN: [id number] DOB: Mar 28, 1936 Today's Date: 02/15/2012 Time: 4098-1191 PT Time Calculation (min): 37 min  PT Assessment / Plan / Recommendation Clinical Impression  Pt admitted with sepsis, sacral wound. Pt with a history of CVA and R sided hemiplegia. Pt has been bedbound for over a year living at [facility]. Pt recently moved SNF's and has been lifted to a chair and able to sit up in [facility] prior to admission. Pt will benefit from skilled PT in the acute care setting in order to maximize functional mobility and strength.     PT Assessment  Patient needs continued PT services    Follow Up Recommendations  Post acute inpatient    Does the patient have the potential to tolerate intense rehabilitation   No, Recommend SNF  Barriers to Discharge        Equipment Recommendations  None recommended by PT    Recommendations for Other Services     Frequency Min 3X/week    Precautions / Restrictions Precautions Precautions: Fall Restrictions Weight Bearing Restrictions: No   Pertinent Vitals/Pain Pain 8/10 sacral with sitting.       Mobility  Bed Mobility Bed Mobility: Rolling Right;Rolling Left;Supine to Sit;Sitting - Scoot to [place] of Bed;Sit to Supine Rolling Right: 1: +2 Total assist Rolling Right: Patient Percentage: 20% Rolling Left: 1: +2 Total assist Rolling Left: Patient Percentage: 0% Supine to Sit: 1: +2 Total assist Supine to Sit: Patient Percentage: 10% Sitting - Scoot to Edge of Bed: 1: +2 Total assist Sitting - Scoot to Edge of Bed: Patient Percentage: 0% Sit to Supine: 1: +2 Total assist Sit to Supine: Patient Percentage: 0% Details for Bed Mobility Assistance: VC for sequencing throughout. Pt able to assist with rolling to the left using LUE. R side in extensor tone with movement.  Transfers Transfers: Not assessed Ambulation/Gait Ambulation/Gait Assistance: Not tested (comment)    Shoulder  Instructions     Exercises     PT Diagnosis: Generalized weakness;Hemiplegia dominant side  PT Problem List: Decreased strength;Decreased activity tolerance;Decreased mobility;Decreased balance;Impaired tone;Pain;Decreased skin integrity PT Treatment Interventions: Functional mobility training;Therapeutic activities;Therapeutic exercise;Balance training;Neuromuscular re-education;Patient/family education;Wheelchair mobility training   PT Goals Acute Rehab PT Goals PT Goal Formulation: With patient Time For Goal Achievement: 02/29/12 Potential to Achieve Goals: Fair Pt will Roll Supine to Right Side: with min assist PT Goal: Rolling Supine to Right Side - Progress: Goal set today Pt will Roll Supine to Left Side: with mod assist PT Goal: Rolling Supine to Left Side - Progress: Goal set today Pt will go Supine/Side to Sit: with mod assist PT Goal: Supine/Side to Sit - Progress: Goal set today Pt will Sit at Edge of Bed: with mod assist;3-5 min PT Goal: Sit at [place] Of Bed - Progress: Goal set today Pt will go Sit to Supine/Side: with mod assist PT Goal: Sit to Supine/Side - Progress: Goal set today Pt will go Sit to Stand: with +2 total assist PT Goal: Sit to Stand - Progress: Goal set today Pt will go Stand to Sit: with +2 total assist PT Goal: Stand to Sit - Progress: Goal set today  Visit Information  Last PT Received On: 02/15/12 Assistance Needed: +2 PT/OT Co-Evaluation/Treatment: Yes    Subjective Data  Patient Stated Goal: "to walk, one step at time"   Prior Functioning  Home Living Available Help at Discharge: Skilled Nursing Facility Type of Home: Skilled Nursing Facility [facility]) Prior Function Level of Independence:  Needs assistance Needs Assistance: Bathing;Dressing;Feeding;Grooming;Toileting;Meal Prep;Transfers Bath: Maximal Dressing: Total Feeding: Maximal Grooming: Maximal Toileting: Total Meal Prep: Total Transfer Assistance: pt's son reports  facility uses lift Able to Take Stairs?: No Driving: No Vocation: Retired Musician: Expressive difficulties;HOH Dominant Hand: Right (now uses Lt for majority)    Cognition  Overall Cognitive Status: Appears within functional limits for tasks assessed/performed (Simultaneous filing. User may not have seen previous data.) Arousal/Alertness: Awake/alert (Simultaneous filing. User may not have seen previous data.) Orientation Level: Appears intact for tasks assessed (Simultaneous filing. User may not have seen previous data.) Behavior During Session: [facility] for tasks performed (Simultaneous filing. User may not have seen previous data.) Cognition - Other Comments: pt with difficulty following one step commands. Also, he would change his answert (e.g. with the date) although they were correct the first time and required cues to verbalize the month- will continue to assess.    Extremity/Trunk Assessment Right Upper Extremity Assessment RUE ROM/Strength/Tone: Deficits;Unable to fully assess;Due to pain RUE ROM/Strength/Tone Deficits: appears to have elbow contracture? and extensor tone- limited testing due to pain Left Upper Extremity Assessment LUE ROM/Strength/Tone: Deficits;Unable to fully assess;Due to pain LUE ROM/Strength/Tone Deficits: Elbow flexion/extension WFL; limited shoulder, wrist and digit flexion Right Lower Extremity Assessment RLE ROM/Strength/Tone: Deficits RLE ROM/Strength/Tone Deficits: PROM 0-85 degrees. Increased extensor tone with passive flexion. No active strength Left Lower Extremity Assessment LLE ROM/Strength/Tone: Deficits LLE ROM/Strength/Tone Deficits: PROM 0-85 degrees. Minimal active movement, although able to perform SLR for ~30 degrees and heel slide for minimal range LLE Sensation: WFL - Light Touch   Balance Balance Balance Assessed: Yes Static Sitting Balance Static Sitting - Balance Support: Bilateral upper extremity supported;Feet  supported Static Sitting - Level of Assistance: 1: +2 Total assist Static Sitting - Comment/# of Minutes: +2 Total 30%. Assist x 1 in front and assist with maintaining stabilization on the left due to left lateral lean as well as support posteriorly. See below for dynamic balance activities Dynamic Sitting Balance Dynamic Sitting - Balance Support: Bilateral upper extremity supported;Feet supported Dynamic Sitting - Level of Assistance: 1: +2 Total assist Dynamic Sitting Balance - Compensations: Pt with posterior left lateral lean Dynamic Sitting - Balance Activities: Lateral lean/weight shifting;Forward lean/weight shifting Dynamic Sitting - Comments: Pt at EOB with 2 assist for safety and support. Pt given cues for posterior/anterior movements as well as L/R movements. Pt able to bring himself to the right and control movement, required assistance to get back to midline and controlled to the left. Pt required mod assist for anterior movement of trunk, no stability posteriorly.  End of Session PT - End of Session Activity Tolerance: Patient limited by pain;Patient limited by fatigue Patient left: in bed;with call bell/phone within reach;with family/visitor present Nurse Communication: Mobility status  GP     Milana Kidney 02/15/2012, 10:17 AM  02/15/2012 Milana Kidney DPT PAGER: [phone] OFFICE: [phone]

## 2012-02-15 NOTE — Progress Notes (Signed)
Clinical Social Worker left message with Selena Batten at [facility] to inquire about [facility] authorization for return to SNF.  All appropriate documents have been submitted for [facility] Medicare review.  CSW to continue to follow and assist as needed.  Angelia Mould, MSW, [place] [phone]

## 2012-02-16 LAB — GLUCOSE, CAPILLARY
Glucose-Capillary: 163 mg/dL — ABNORMAL HIGH (ref 70–99)
Glucose-Capillary: 151 mg/dL — ABNORMAL HIGH (ref 70–99)
Glucose-Capillary: 166 mg/dL — ABNORMAL HIGH (ref 70–99)

## 2012-02-16 MED ORDER — LEVOFLOXACIN 500 MG PO TABS: 500.0000 mg | ORAL_TABLET | Freq: Every day | ORAL | Status: DC

## 2012-02-16 MED ORDER — ZINC SULFATE 220 (50 ZN) MG PO CAPS: 220.0000 mg | ORAL_CAPSULE | Freq: Every day | ORAL | Status: DC

## 2012-02-16 MED ORDER — JEVITY 1.2 CAL PO LIQD: 1000.0000 mL | ORAL | Status: DC

## 2012-02-16 NOTE — Discharge Summary (Signed)
Physician Discharge Summary  [name] ZOX:[id number] DOB: 1935/09/11 DOA: 02/12/2012  PCP: No primary provider on file.  Admit date: 02/12/2012 Discharge date: 02/16/2012  Time spent: 35 minutes  Recommendations for Outpatient Follow-up:  1. Recommend continuous deviation of the goals of care 2. Finish ten-day course of Levaquin on 02/22/2012-was thought to be aspiration pneumonia 3. Recommend repeat TSH T3-T4 as well as basic metabolic panel and CBC with differential in about 3-5 days. 4. Would individualize this patient's dose of Lasix-we have discontinued this given the patient came in with renal insufficiency which was acute and potentially contributing to his metabolic encephalopathy on admission 5. Would carefully consider risks versus benefits of metformin-patient had a lactic acidosis which may have come from this    Discharge Diagnoses:  Principal Problem:  *Lactic acidosis Active Problems:  Dysphagia due to old stroke  Hypothyroidism  DM (diabetes mellitus)  Altered mental state  Sacral decubitus ulcer, stage IV  UTI (lower urinary tract infection)  Back pain   Discharge Condition: Guarded  Diet recommendation: Dysphagia 2 diet  Filed Weights   02/12/12 2300  Weight: 95.3 kg (210 lb 1.6 oz)    History of present illness:  76 yr old AAM admitted with likely Sepsis 2/2 UTI. Presented to [facility] with ? likely severe sepsis potentially of Urinary/Aspiartion causes. Has a h/o multiple co-morbidities inclusive of CVA in the past, has a h/o diverting colostomy and PEG feeds. It was initially thought that the patient had severe sepsis and he was made a DNR/DNI on admit. Patient made a very quick recovery on the second day of admission with broad-spectrum antibiotics. No source of sepsis was delineated given his cultures were negative. It was thought given his recent stroke in 2011 that a potential source would be aspiration pneumonia. His antibiotics were narrowed  appropriately and patient changed his mind about DO NOT RESUSCITATE status. He wished to BE a full code. Palliative medicine was involved in patient's care to her bulimia goals of care. It is recommended on discharge the above recommendations please see below for full hospital course   Hospital Course:  Assessment/Plan:  1. Sepsis, likely severe and from a urinary cause-CT scan performed on admission to rule out pulmonary embolism does not confirm pneumonia, urine culture from 10/31 grew only 45,000 colony-forming units. Broad-spectrum Zosyn, vancomycin-was transitioned to PO Levaquin 500 for 02/24/12-this would cover most respiratory and urinary pathogens and patient will need another 6 days course of this to and on 02/22/2012 2. Acute toxic metabolic encephalopathy-probably related to #1-resolved 3. Acute kidney injury/acute renal failure secondary to sepsis-BUN/creatinine has begun to trend down, co-inciding with his improved mental status-potential cofounder's were metformin and Lasix-Lasix has been discontinued his hospitalization. He potentially will need revisit of his metformin which has been continued however. 4. History of infective endocarditis in 01/2011-EF 60-65 % 02/03/12 Grade 2 diast dysfunction-IVF d/c 02/15/12-no role for further management. Blood cultures were negative x1 and given low suspicion by Jones criteria in for endocarditis did not do any further workup 5. History of CVA-supportive therapy. Continue aspirin 325 mg daily-potentially may benefit from statin long-term. Potentially may not change his long-term outcome. Individualized decision per nursing home physician. 6. History of hypothyroidism-continue levothyroxine 300 mcg daily, Cytomel 25 mcg daily. See above  7. Diabetes mellitus-continue sliding scale coverage--change to QIDACHS coverage-metformin and other medications continued subsequent to discharge-continue Lantus at regular dose at nursing home. Monitor basic  metabolic panel given Lantus and insulin are cleared renally to prevent  potential hypoglycemia 8. ? History glaucoma patient gets atropine drops 1% ophthalmic 2 drops every 6 hourly 9. History sacral decubitus-mattress overlay-we will look at the wounds probably when the patient has been changed-continue Vitamin 500 bid, added Zinc oxide twice a day-patient have outpatient followup with wound care at the wound center 10. History depression-continue Celexa suspension 10 mg daily, Trazadone 25 qhs 11. 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 Soft tissue mass at the inferior aspect of the left SI joint with associated sclerotic changes is worrisome for a primary spur, or metastasis of the none known primary. 2. No evidence for aspiration pneumonia. 3. Status post partial colectomy and colostomy. 4.  Gastrostomy tube in place. The jejunal tube is not X of the stomach. 5. Atherosclerosis. 6. Degenerative changes of the lumbar spine.  EEG done 02/13/2012 showed mild nonspecific generalized cerebral dysfunction i.e. encephalopathy with no seizure or disposition of this reinforced  Antibiotics:  Levaquin 10/29>>11/1, narrowed to orals to d/c on 02/24/12  Zosyn 10/29>>11/1  Discharge Exam: Filed Vitals:   02/15/12 0508 02/15/12 1404 02/15/12 2211 02/16/12 0500  BP: 117/69 170/79 136/72 127/75  Pulse: 71 69 68 74  Temp: 98.5 F (36.9 C) 97.8 F (36.6 C) 98.1 F (36.7 C) 98.3 F (36.8 C)  TempSrc: Axillary Axillary Oral Oral  Resp: 18     Height:      Weight:      SpO2: 98%  96% 97%   Alert pleasant a little sleepy did watch the basketball game last night and knows 1. It was the [facility]. States he has no complaints other than progressive sputum.  General: Alert oriented no apparent distress Cardiovascular: S1-S2 no murmur or gallop Respiratory: Clinically clear  Discharge Instructions  Discharge Orders    Future Appointments: Provider: Department: Dept Phone: Center:   03/07/2012 8:00 AM [facility] [phone] [facility]       Medication List     As of 02/16/2012  9:40 AM    STOP taking these medications         furosemide 20 MG tablet   Commonly known as: LASIX      sennosides-docusate sodium 8.6-50 MG tablet   Commonly known as: SENOKOT-S      TAKE these medications         acetaminophen 160 MG/5ML elixir   Commonly known as: TYLENOL   Give 640 mg by tube every 6 (six) hours as needed. For pain      aspirin 325 MG tablet   Give  325 mg by tube daily.      atropine 1 % ophthalmic solution   Take 2 drops by mouth every 6 (six) hours.      citalopram 10 MG/5ML suspension   Commonly known as: CELEXA   Give 10 mg by tube daily.      feeding supplement (JEVITY 1.2 CAL) Liqd   Place 1,000 mLs into feeding tube continuous.       ferrous sulfate 300 (60 FE) MG/5ML syrup   Give 300 mg by tube 2 (two) times daily.      gabapentin 300 MG capsule   Commonly known as: NEURONTIN   Give 300 mg by tube at bedtime.      insulin glargine 100 UNIT/ML injection   Commonly known as: LANTUS   Inject 10 Units into the skin at bedtime.      levofloxacin 500 MG tablet   Commonly known as: LEVAQUIN   Take 1 tablet (500 mg total) by mouth daily.      levothyroxine 150 MCG tablet   Commonly known as: SYNTHROID, LEVOTHROID   Give 300 mcg by tube at bedtime.      liothyronine 25 MCG tablet   Commonly known as: CYTOMEL   Give 25 mcg by tube daily.      metFORMIN 500 MG tablet   Commonly known as: GLUCOPHAGE   Give 500 mg by tube daily.      metoprolol tartrate 25 MG tablet   Commonly known as: LOPRESSOR   Give 25 mg by tube 2 (two) times daily.      mirtazapine 15 MG tablet   Commonly known as: REMERON   Give 15 mg by tube at bedtime.      multivitamins ther. w/minerals Tabs   Give 1 tablet by tube daily.      potassium chloride 20 MEQ/15ML (10%) solution   Give 10 mEq by tube daily.      pravastatin 40 MG tablet   Commonly known as: PRAVACHOL   Give 40 mg by tube at bedtime.      promethazine 25 MG tablet   Commonly known as: PHENERGAN   Give 25 mg by tube every 6 (six) hours as needed. For nausea      ranitidine 75 MG tablet   Commonly known as: ZANTAC   Give 150 mg by tube 2 (two) times daily.      saccharomyces boulardii 250 MG capsule   Commonly known as: FLORASTOR   Give 250 mg by tube 2 (two) times daily.      thiamine 100 MG tablet   Commonly known as: VITAMIN B-1   Give 100 mg by tube every morning.      traMADol 50 MG tablet   Commonly known as: ULTRAM   Give 50 mg by tube every 6 (six) hours as needed.      traZODone 25 mg Tabs   Commonly known as: DESYREL   Take 25 mg by mouth at bedtime.      vitamin C 500 MG tablet   Commonly known as: ASCORBIC ACID   Give 500 mg by tube 2 (two)  times daily.      zinc sulfate 220 MG capsule   Take 1 capsule (220 mg total) by mouth daily.           The results of significant diagnostics from this hospitalization (including imaging, microbiology, ancillary and laboratory) are listed below for reference.    Significant Diagnostic Studies: Ct Abdomen Pelvis W  Contrast  02/12/2012  *RADIOLOGY REPORT*  Clinical Data: Abdominal pain.  Cough.  Aspiration risk.  CT ABDOMEN AND PELVIS WITH CONTRAST  Technique:  Multidetector CT imaging of the abdomen and pelvis was performed following the standard protocol during bolus administration of intravenous contrast.  Contrast: OMNIPAQUE IOHEXOL 300 MG/ML  SOLN  Comparison: Single view abdomen 01/19/2011.  Findings: The lung bases are clear without focal nodule, mass, or airspace disease.  Cardiac pacing wires are noted.  The liver and spleen are within normal limits.  A small hiatal hernia is present.  A gastrostomy tube is in place.  The jejunostomy tube does not leave the stomach.  The duodenum and pancreas are within normal limits.  The study is moderately degraded by patient motion.  The patient is status post cholecystectomy.  The adrenal glands are normal bilaterally.  The kidneys and ureters are within normal limits bilaterally.  The urinary bladder is collapsed about a Foley catheter.  There is a soft tissue mass at the inferior left SI joint which measures 4.3 x 6.5 cm.  There is sclerosis of the associated sacrum.  The reservoir of the penile implant is noted in the left lower quadrant.  A partial sigmoid colectomy has been performed.  A left lower quadrant colostomy is noted.  A mucous pouch is also present at the same site.  The small bowel is unremarkable.  The more proximal colon and appendix are within normal limits.  Degenerative changes are present in the lower lumbar facets.  The bone windows are otherwise unremarkable.  IMPRESSION:  1.  Soft tissue mass at the inferior aspect of the  left SI joint with associated sclerotic changes is worrisome for a primary spur, or metastasis of the none known primary. 2.  No evidence for aspiration pneumonia. 3.  Status post partial colectomy and colostomy. 4.  Gastrostomy tube in place.  The jejunal tube is not X of the stomach. 5.  Atherosclerosis. 6.  Degenerative changes of the lumbar spine.   Original Report Authenticated By: Jamesetta Orleans. MATTERN, M.D.    Ir Gj Tube Change  02/05/2012  *RADIOLOGY REPORT*  Indication: Clogged jejunostomy lumen  FLUROSCOPIC GUIDED REPLACEMENT OF GASTROJEJUNOSTOMY TUBE  Comparison: Fluoroscopic guided G-tube exchange - 09/04/2011  Intravenous Medications: None  Contrast: 50 ml Omnipaque-300, administered enterically  Fluoroscopy Time: 5.5 minutes.  Complications: None immediate  Technique / Findings:  The upper abdomen and external portion of the existing gastrostomy tube was prepped and draped in the usual sterile fashion, and a sterile drape was applied covering the operative field.  Maximum barrier sterile technique with sterile gowns and gloves were used for the procedure.  A timeout was performed prior to the initiation of the procedure.  The jejunal lumen of the existing 18-French Gastrojejunostomy catheter was cannulated with a stiff Glidewire, however the wire was unable to be advanced throughout the entirety of the jejunostomy lumen.  As such, a stiff Glidewire was utilized to advance a Kumpe catheter alongside the track of the existing gastrostomy tube.  The Kumpe catheter was ultimately positioned within the distal jejunum. Limited contrast injection confirmed appropriate positioning.  Under intermittent fluoroscopic guidance, the existing gastrojejunostomy tube was removed and a new 20-French gastrojejunostomy catheter was inserted with tip ultimately terminating within the proximal jejunum.  Contrast injection confirmed appropriate positioning of both the gastrostomy and jejunostomy lumens.  Fluoroscopic  images were obtained.  A dressing was placed.  The patient tolerated procedure well without immediate postprocedural complication.  Impression:  Successful fluoroscopic guided exchange and upsize of a 20 French balloon inflatable gastrojejunostomy tube.  Both lumens are ready for immediate use.   Original Report Authenticated By: Waynard Reeds, M.D.    Dg Chest Portable 1 View  02/12/2012  *RADIOLOGY REPORT*  Clinical Data: The kidney, possible aspiration.  PORTABLE CHEST - 1 VIEW  Comparison: 01/19/2011.  Findings: The heart and mediastinal structures are normal.  The aorta is tortuous.  The lungs are clear.  There are no effusions or pneumothoraces.  There are a sequential cardiac pacing wires in place with the generator on the right.  There are no acute bony changes.  There are postop changes of the cervical spine.  IMPRESSION: No active disease.   Original Report Authenticated By: Mervin Hack, M.D.     Microbiology: Recent Results (from the past 240 hour(s))  URINE CULTURE     Status: Normal   Collection Time   02/12/12  5:20 PM      Component Value Range Status Comment   Specimen Description URINE, RANDOM   Final    Special Requests NONE   Final    Culture  Setup Time 02/12/2012 18:02   Final    Colony Count 75,000 COLONIES/ML   Final    Culture     Final    Value: Multiple bacterial morphotypes present, none predominant. Suggest appropriate recollection if clinically indicated.   Report Status 02/13/2012 FINAL   Final   CULTURE, BLOOD (ROUTINE X 2)     Status: Normal (Preliminary result)   Collection Time   02/12/12  5:48 PM      Component Value Range Status Comment   Specimen Description BLOOD HAND LEFT   Final    Special Requests BOTTLES DRAWN AEROBIC ONLY [facility]   Final    Culture  Setup Time 02/12/2012 23:05   Final    Culture     Final    Value:        BLOOD CULTURE RECEIVED NO GROWTH TO DATE CULTURE WILL BE HELD FOR 5 DAYS BEFORE ISSUING A FINAL NEGATIVE REPORT    Report Status PENDING   Incomplete   MRSA PCR SCREENING     Status: Normal   Collection Time   02/12/12 11:58 PM      Component Value Range Status Comment   MRSA by PCR NEGATIVE  NEGATIVE Final   CULTURE, BLOOD (ROUTINE X 2)     Status: Normal (Preliminary result)   Collection Time   02/13/12 12:40 AM      Component Value Range Status Comment   Specimen Description BLOOD RIGHT HAND   Final    Special Requests BOTTLES DRAWN AEROBIC ONLY 5CC   Final    Culture  Setup Time 02/13/2012 04:07   Final    Culture     Final    Value:        BLOOD CULTURE RECEIVED NO GROWTH TO DATE CULTURE WILL BE HELD FOR 5 DAYS BEFORE ISSUING A FINAL NEGATIVE REPORT   Report Status PENDING   Incomplete   URINE CULTURE     Status: Normal   Collection Time   02/14/12  6:13 AM      Component Value Range Status Comment   Specimen Description URINE, RANDOM   Final    Special Requests NONE   Final    Culture  Setup Time 02/14/2012 14:52   Final    Colony Count NO GROWTH   Final    Culture NO  GROWTH   Final    Report Status 02/15/2012 FINAL   Final      Labs: Basic Metabolic Panel:  Lab 02/14/12 1610 02/12/12 1638  NA 145 140  K 3.6 4.7  CL 111 98  CO2 24 23  GLUCOSE 126* 306*  BUN 37* 58*  CREATININE 1.27 1.21  CALCIUM 9.3 10.8*  MG -- --  PHOS -- --   Liver Function Tests:  Lab 02/14/12 0640  AST 20  ALT 12  ALKPHOS 71  BILITOT 0.3  PROT 7.6  ALBUMIN 2.1*   No results found for this basename: LIPASE:5,AMYLASE:5 in the last 168 hours No results found for this basename: AMMONIA:5 in the last 168 hours CBC:  Lab 02/14/12 0640 02/12/12 1638  WBC 6.0 12.4*  NEUTROABS -- 8.8*  HGB 9.9* 11.0*  HCT 31.9* 34.8*  MCV 79.2 78.2  PLT 301 379   Cardiac Enzymes: No results found for this basename: CKTOTAL:5,CKMB:5,CKMBINDEX:5,TROPONINI:5 in the last 168 hours BNP: BNP (last 3 results) No results found for this basename: PROBNP:3 in the last 8760 hours CBG:  Lab 02/16/12 0743 02/16/12 0453  02/16/12 0040 02/15/12 1955 02/15/12 1646  GLUCAP 151* 163* 120* 118* 133*       Signed:  Rhetta Mura  [facility] 02/16/2012, 9:29 AM

## 2012-02-16 NOTE — Plan of Care (Signed)
Problem: Discharge Progression Outcomes Goal: Tolerating diet Outcome: Adequate for Discharge PEG tube feeding with Jevity

## 2012-02-17 NOTE — Progress Notes (Signed)
CSW was consulted to complete discharge of patient. Pt to transfer to [facility] today via [facility]. [facility] is aware of d/c. D/C packet complete with chart copy, signed FL2, and signed hard Rx.  CSW signing off as no other CSW needs identified at this time.  Lia Foyer, LCSWA [facility] Clinical Social Worker Contact #: [phone] (weekend)

## 2012-02-18 LAB — CULTURE, BLOOD (ROUTINE X 2)

## 2012-02-19 LAB — CULTURE, BLOOD (ROUTINE X 2): Culture: NO GROWTH

## 2012-03-07 ENCOUNTER — Encounter (HOSPITAL_BASED_OUTPATIENT_CLINIC_OR_DEPARTMENT_OTHER): Payer: Medicare Other | Attending: General Surgery

## 2012-04-12 ENCOUNTER — Inpatient Hospital Stay (HOSPITAL_COMMUNITY)
Admission: EM | Admit: 2012-04-12 | Discharge: 2012-04-17 | DRG: 377 | Disposition: A | Discharge status: Skilled Nursing Facility | Payer: Medicare Other | Attending: Internal Medicine | Admitting: Internal Medicine

## 2012-04-12 ENCOUNTER — Encounter (HOSPITAL_COMMUNITY): Payer: Self-pay | Admitting: Emergency Medicine

## 2012-04-12 DIAGNOSIS — T83511A Infection and inflammatory reaction due to indwelling urethral catheter, initial encounter: Secondary | ICD-10-CM | POA: Diagnosis present

## 2012-04-12 DIAGNOSIS — I38 Endocarditis, valve unspecified: Secondary | ICD-10-CM

## 2012-04-12 DIAGNOSIS — I69959 Hemiplegia and hemiparesis following unspecified cerebrovascular disease affecting unspecified side: Secondary | ICD-10-CM

## 2012-04-12 DIAGNOSIS — E872 Acidosis: Secondary | ICD-10-CM

## 2012-04-12 DIAGNOSIS — L89154 Pressure ulcer of sacral region, stage 4: Secondary | ICD-10-CM

## 2012-04-12 DIAGNOSIS — Y9210 Unspecified residential institution as the place of occurrence of the external cause: Secondary | ICD-10-CM | POA: Diagnosis present

## 2012-04-12 DIAGNOSIS — K922 Gastrointestinal hemorrhage, unspecified: Secondary | ICD-10-CM

## 2012-04-12 DIAGNOSIS — I69991 Dysphagia following unspecified cerebrovascular disease: Secondary | ICD-10-CM

## 2012-04-12 DIAGNOSIS — E119 Type 2 diabetes mellitus without complications: Secondary | ICD-10-CM | POA: Diagnosis present

## 2012-04-12 DIAGNOSIS — Z7982 Long term (current) use of aspirin: Secondary | ICD-10-CM

## 2012-04-12 DIAGNOSIS — R404 Transient alteration of awareness: Secondary | ICD-10-CM | POA: Diagnosis present

## 2012-04-12 DIAGNOSIS — N390 Urinary tract infection, site not specified: Secondary | ICD-10-CM | POA: Diagnosis present

## 2012-04-12 DIAGNOSIS — D649 Anemia, unspecified: Secondary | ICD-10-CM | POA: Diagnosis present

## 2012-04-12 DIAGNOSIS — Z950 Presence of cardiac pacemaker: Secondary | ICD-10-CM

## 2012-04-12 DIAGNOSIS — E875 Hyperkalemia: Secondary | ICD-10-CM | POA: Diagnosis present

## 2012-04-12 DIAGNOSIS — E860 Dehydration: Secondary | ICD-10-CM

## 2012-04-12 DIAGNOSIS — Z932 Ileostomy status: Secondary | ICD-10-CM

## 2012-04-12 DIAGNOSIS — I69391 Dysphagia following cerebral infarction: Secondary | ICD-10-CM

## 2012-04-12 DIAGNOSIS — I6930 Unspecified sequelae of cerebral infarction: Secondary | ICD-10-CM

## 2012-04-12 DIAGNOSIS — E1365 Other specified diabetes mellitus with hyperglycemia: Secondary | ICD-10-CM | POA: Diagnosis present

## 2012-04-12 DIAGNOSIS — Z9049 Acquired absence of other specified parts of digestive tract: Secondary | ICD-10-CM

## 2012-04-12 DIAGNOSIS — Z79899 Other long term (current) drug therapy: Secondary | ICD-10-CM

## 2012-04-12 DIAGNOSIS — B964 Proteus (mirabilis) (morganii) as the cause of diseases classified elsewhere: Secondary | ICD-10-CM | POA: Diagnosis present

## 2012-04-12 DIAGNOSIS — Y846 Urinary catheterization as the cause of abnormal reaction of the patient, or of later complication, without mention of misadventure at the time of the procedure: Secondary | ICD-10-CM | POA: Diagnosis present

## 2012-04-12 DIAGNOSIS — E039 Hypothyroidism, unspecified: Secondary | ICD-10-CM | POA: Diagnosis present

## 2012-04-12 DIAGNOSIS — R1310 Dysphagia, unspecified: Secondary | ICD-10-CM | POA: Diagnosis present

## 2012-04-12 DIAGNOSIS — K920 Hematemesis: Principal | ICD-10-CM | POA: Diagnosis present

## 2012-04-12 DIAGNOSIS — Z794 Long term (current) use of insulin: Secondary | ICD-10-CM

## 2012-04-12 DIAGNOSIS — L89109 Pressure ulcer of unspecified part of back, unspecified stage: Secondary | ICD-10-CM | POA: Diagnosis present

## 2012-04-12 DIAGNOSIS — L8994 Pressure ulcer of unspecified site, stage 4: Secondary | ICD-10-CM | POA: Diagnosis present

## 2012-04-12 DIAGNOSIS — I10 Essential (primary) hypertension: Secondary | ICD-10-CM | POA: Diagnosis present

## 2012-04-12 DIAGNOSIS — M549 Dorsalgia, unspecified: Secondary | ICD-10-CM

## 2012-04-12 DIAGNOSIS — R4182 Altered mental status, unspecified: Secondary | ICD-10-CM | POA: Diagnosis present

## 2012-04-12 DIAGNOSIS — R042 Hemoptysis: Secondary | ICD-10-CM | POA: Diagnosis present

## 2012-04-12 LAB — COMPREHENSIVE METABOLIC PANEL
Albumin: 2.3 g/dL — ABNORMAL LOW (ref 3.5–5.2)
Alkaline Phosphatase: 96 U/L (ref 39–117)
BUN: 41 mg/dL — ABNORMAL HIGH (ref 6–23)
Chloride: 99 mEq/L (ref 96–112)
Creatinine, Ser: 0.85 mg/dL (ref 0.50–1.35)
GFR calc Af Amer: >90 mL/min (ref >90)
Glucose, Bld: 238 mg/dL — ABNORMAL HIGH (ref 70–99)
Total Bilirubin: 0.3 mg/dL (ref 0.3–1.2)
Total Protein: 8.9 g/dL — ABNORMAL HIGH (ref 6.0–8.3)

## 2012-04-12 LAB — CBC WITH DIFFERENTIAL/PLATELET
Basophils Relative: 0 % (ref 0–1)
Eosinophils Absolute: 0.2 10*3/uL (ref 0.0–0.7)
HCT: 28.6 % — ABNORMAL LOW (ref 39.0–52.0)
Hemoglobin: 8.7 g/dL — ABNORMAL LOW (ref 13.0–17.0)
Lymphs Abs: 1.9 10*3/uL (ref 0.7–4.0)
MCH: 23.6 pg — ABNORMAL LOW (ref 26.0–34.0)
MCHC: 30.4 g/dL (ref 30.0–36.0)
Monocytes Absolute: 1.2 10*3/uL — ABNORMAL HIGH (ref 0.1–1.0)
Monocytes Relative: 14 % — ABNORMAL HIGH (ref 3–12)
RBC: 3.68 MIL/uL — ABNORMAL LOW (ref 4.22–5.81)

## 2012-04-12 LAB — LACTIC ACID, PLASMA: Lactic Acid, Venous: 2.3 mmol/L — ABNORMAL HIGH (ref 0.5–2.2)

## 2012-04-12 NOTE — ED Notes (Signed)
Pt to ED from [facility] by [facility] with reports of pt vomiting blood.  Pt denies any problems at this time.

## 2012-04-13 ENCOUNTER — Observation Stay (HOSPITAL_COMMUNITY): Payer: Medicare Other

## 2012-04-13 ENCOUNTER — Encounter (HOSPITAL_COMMUNITY): Payer: Self-pay | Admitting: *Deleted

## 2012-04-13 DIAGNOSIS — E119 Type 2 diabetes mellitus without complications: Secondary | ICD-10-CM

## 2012-04-13 DIAGNOSIS — E875 Hyperkalemia: Secondary | ICD-10-CM | POA: Diagnosis present

## 2012-04-13 DIAGNOSIS — R4182 Altered mental status, unspecified: Secondary | ICD-10-CM

## 2012-04-13 DIAGNOSIS — K920 Hematemesis: Principal | ICD-10-CM | POA: Clinically undetermined

## 2012-04-13 DIAGNOSIS — I69991 Dysphagia following unspecified cerebrovascular disease: Secondary | ICD-10-CM

## 2012-04-13 DIAGNOSIS — K922 Gastrointestinal hemorrhage, unspecified: Secondary | ICD-10-CM

## 2012-04-13 LAB — URINALYSIS, ROUTINE W REFLEX MICROSCOPIC
Bilirubin Urine: NEGATIVE
Ketones, ur: NEGATIVE mg/dL
Nitrite: NEGATIVE
Specific Gravity, Urine: 1.022 (ref 1.005–1.030)
Urobilinogen, UA: 1 mg/dL (ref 0.0–1.0)

## 2012-04-13 LAB — BASIC METABOLIC PANEL
CO2: 24 mEq/L (ref 19–32)
Calcium: 10 mg/dL (ref 8.4–10.5)
Creatinine, Ser: 0.88 mg/dL (ref 0.50–1.35)
GFR calc non Af Amer: 83 mL/min — ABNORMAL LOW (ref >90)
Glucose, Bld: 139 mg/dL — ABNORMAL HIGH (ref 70–99)
Sodium: 144 mEq/L (ref 135–145)

## 2012-04-13 LAB — IRON AND TIBC
Iron: 15 ug/dL — ABNORMAL LOW (ref 42–135)
Saturation Ratios: 8 % — ABNORMAL LOW (ref 20–55)
UIBC: 180 ug/dL (ref 125–400)

## 2012-04-13 LAB — COMPREHENSIVE METABOLIC PANEL
Alkaline Phosphatase: 95 U/L (ref 39–117)
BUN: 40 mg/dL — ABNORMAL HIGH (ref 6–23)
Chloride: 106 mEq/L (ref 96–112)
GFR calc Af Amer: >90 mL/min (ref >90)
Glucose, Bld: 147 mg/dL — ABNORMAL HIGH (ref 70–99)
Potassium: 3.8 mEq/L (ref 3.5–5.1)
Total Bilirubin: 0.4 mg/dL (ref 0.3–1.2)

## 2012-04-13 LAB — URINE MICROSCOPIC-ADD ON

## 2012-04-13 LAB — HEMOGLOBIN A1C: Mean Plasma Glucose: 203 mg/dL — ABNORMAL HIGH (ref <117)

## 2012-04-13 LAB — GLUCOSE, CAPILLARY
Glucose-Capillary: 155 mg/dL — ABNORMAL HIGH (ref 70–99)
Glucose-Capillary: 143 mg/dL — ABNORMAL HIGH (ref 70–99)

## 2012-04-13 LAB — TSH: TSH: 5.205 u[IU]/mL — ABNORMAL HIGH (ref 0.350–4.500)

## 2012-04-13 LAB — HEMOGLOBIN AND HEMATOCRIT, BLOOD: HCT: 29.6 % — ABNORMAL LOW (ref 39.0–52.0)

## 2012-04-13 LAB — TYPE AND SCREEN: ABO/RH(D): B POS

## 2012-04-13 LAB — RETICULOCYTES: Retic Count, Absolute: 45 10*3/uL (ref 19.0–186.0)

## 2012-04-13 LAB — OCCULT BLOOD X 1 CARD TO LAB, STOOL: Fecal Occult Bld: NEGATIVE

## 2012-04-13 LAB — FOLATE: Folate: >20 ng/mL

## 2012-04-13 MED ORDER — TRAZODONE 25 MG HALF TABLET
25.0000 mg | ORAL_TABLET | Freq: Every day | ORAL | Status: DC
  Administered 2012-04-13 – 2012-04-16 (×4): 25 mg via ORAL
  Filled 2012-04-13 (×5): qty 1

## 2012-04-13 MED ORDER — SODIUM CHLORIDE 0.9 % IV BOLUS (SEPSIS)
500.0000 mL | Freq: Once | INTRAVENOUS | Status: AC
  Administered 2012-04-13: 02:00:00 via INTRAVENOUS

## 2012-04-13 MED ORDER — LIOTHYRONINE SODIUM 25 MCG PO TABS
25.0000 ug | ORAL_TABLET | Freq: Every day | ORAL | Status: DC
  Administered 2012-04-13 – 2012-04-17 (×5): 25 ug via ORAL
  Filled 2012-04-13 (×7): qty 1

## 2012-04-13 MED ORDER — ATROPINE SULFATE 1 % OP SOLN
2.0000 [drp] | Freq: Four times a day (QID) | OPHTHALMIC | Status: DC
  Administered 2012-04-13 – 2012-04-17 (×17): 2 [drp] via SUBLINGUAL
  Filled 2012-04-13 (×2): qty 2

## 2012-04-13 MED ORDER — PROMETHAZINE HCL 25 MG PO TABS: 25.0000 mg | ORAL_TABLET | Freq: Four times a day (QID) | ORAL | Status: DC | PRN

## 2012-04-13 MED ORDER — BIOTENE DRY MOUTH MT LIQD
15.0000 mL | Freq: Two times a day (BID) | OROMUCOSAL | Status: DC
  Administered 2012-04-13 – 2012-04-16 (×6): 15 mL via OROMUCOSAL

## 2012-04-13 MED ORDER — INSULIN ASPART 100 UNIT/ML ~~LOC~~ SOLN
0.0000 [IU] | SUBCUTANEOUS | Status: DC
  Administered 2012-04-13 (×3): 2 [IU] via SUBCUTANEOUS
  Administered 2012-04-13: 3 [IU] via SUBCUTANEOUS
  Administered 2012-04-13 – 2012-04-14 (×5): 2 [IU] via SUBCUTANEOUS
  Administered 2012-04-15 (×2): 3 [IU] via SUBCUTANEOUS
  Administered 2012-04-15: 2 [IU] via SUBCUTANEOUS
  Administered 2012-04-15 – 2012-04-16 (×4): 3 [IU] via SUBCUTANEOUS
  Administered 2012-04-16 (×2): 5 [IU] via SUBCUTANEOUS
  Administered 2012-04-17: 3 [IU] via SUBCUTANEOUS
  Administered 2012-04-17: 5 [IU] via SUBCUTANEOUS
  Administered 2012-04-17: 3 [IU] via SUBCUTANEOUS

## 2012-04-13 MED ORDER — METOPROLOL TARTRATE 25 MG PO TABS
25.0000 mg | ORAL_TABLET | Freq: Two times a day (BID) | ORAL | Status: DC
  Administered 2012-04-13 – 2012-04-17 (×9): 25 mg via ORAL
  Filled 2012-04-13 (×10): qty 1

## 2012-04-13 MED ORDER — FERROUS SULFATE 300 (60 FE) MG/5ML PO SYRP
300.0000 mg | ORAL_SOLUTION | Freq: Two times a day (BID) | ORAL | Status: DC
  Administered 2012-04-13 – 2012-04-17 (×9): 300 mg
  Filled 2012-04-13 (×10): qty 5

## 2012-04-13 MED ORDER — ONDANSETRON HCL 4 MG/2ML IJ SOLN: 4.0000 mg | Freq: Four times a day (QID) | INTRAMUSCULAR | Status: DC | PRN

## 2012-04-13 MED ORDER — LEVOTHYROXINE SODIUM 100 MCG PO TABS
100.0000 ug | ORAL_TABLET | Freq: Every day | ORAL | Status: DC
  Administered 2012-04-13 – 2012-04-17 (×5): 100 ug via ORAL
  Filled 2012-04-13 (×6): qty 1

## 2012-04-13 MED ORDER — ZINC SULFATE 220 (50 ZN) MG PO CAPS
220.0000 mg | ORAL_CAPSULE | Freq: Every day | ORAL | Status: DC
  Administered 2012-04-13 – 2012-04-17 (×5): 220 mg via ORAL
  Filled 2012-04-13 (×5): qty 1

## 2012-04-13 MED ORDER — NYSTATIN 100000 UNIT/GM EX POWD
Freq: Two times a day (BID) | CUTANEOUS | Status: DC
  Administered 2012-04-13 – 2012-04-17 (×6): via TOPICAL
  Filled 2012-04-13 (×2): qty 15

## 2012-04-13 MED ORDER — SODIUM POLYSTYRENE SULFONATE 15 GM/60ML PO SUSP
15.0000 g | Freq: Once | ORAL | Status: AC
  Administered 2012-04-13: 15 g
  Filled 2012-04-13: qty 60

## 2012-04-13 MED ORDER — GABAPENTIN 300 MG PO CAPS
300.0000 mg | ORAL_CAPSULE | Freq: Every day | ORAL | Status: DC
  Administered 2012-04-13 – 2012-04-16 (×4): 300 mg via ORAL
  Filled 2012-04-13 (×5): qty 1

## 2012-04-13 MED ORDER — SODIUM CHLORIDE 0.9 % IV SOLN
8.0000 mg/h | INTRAVENOUS | Status: DC
  Administered 2012-04-13 – 2012-04-14 (×2): 8 mg/h via INTRAVENOUS
  Filled 2012-04-13 (×9): qty 80

## 2012-04-13 MED ORDER — SACCHAROMYCES BOULARDII 250 MG PO CAPS
250.0000 mg | ORAL_CAPSULE | Freq: Two times a day (BID) | ORAL | Status: DC
  Administered 2012-04-13 – 2012-04-17 (×9): 250 mg via ORAL
  Filled 2012-04-13 (×10): qty 1

## 2012-04-13 MED ORDER — CITALOPRAM HYDROBROMIDE 10 MG PO TABS
10.0000 mg | ORAL_TABLET | Freq: Every day | ORAL | Status: DC
  Administered 2012-04-13 – 2012-04-17 (×5): 10 mg via ORAL
  Filled 2012-04-13 (×5): qty 1

## 2012-04-13 MED ORDER — TRAMADOL HCL 50 MG PO TABS
50.0000 mg | ORAL_TABLET | Freq: Four times a day (QID) | ORAL | Status: DC | PRN
  Administered 2012-04-16: 50 mg via ORAL
  Filled 2012-04-13: qty 1

## 2012-04-13 MED ORDER — INSULIN GLARGINE 100 UNIT/ML ~~LOC~~ SOLN
20.0000 [IU] | Freq: Every day | SUBCUTANEOUS | Status: DC
  Administered 2012-04-13 – 2012-04-16 (×4): 20 [IU] via SUBCUTANEOUS

## 2012-04-13 MED ORDER — VITAMIN C 500 MG PO TABS
500.0000 mg | ORAL_TABLET | Freq: Two times a day (BID) | ORAL | Status: DC
  Administered 2012-04-13 – 2012-04-17 (×9): 500 mg via ORAL
  Filled 2012-04-13 (×10): qty 1

## 2012-04-13 NOTE — Progress Notes (Signed)
Utilization review completed.  

## 2012-04-13 NOTE — ED Provider Notes (Signed)
History     CSN: [id number]  Arrival date & time 04/12/12  2142   First MD Initiated Contact with Patient 04/13/12 0039      Chief Complaint  Patient presents with  . Hematemesis    Patient is a 76 y.o. male presenting with vomiting. The history is provided by the patient. The history is limited by the condition of the patient.  Emesis  This is a new problem. Episode onset: just prior to arrival. The problem has been gradually improving. The emesis has an appearance of bright red blood. There has been no fever. Associated symptoms include abdominal pain.  nothing improves his symptoms  history is limited due to altered mental status Per report, pt vomited blood at the nursing home Pt reports he has abdominal pain No other complaints are reported   Past Medical History  Diagnosis Date  . Stroke     Hx of with right hemiparesis  . Hypertension   . Endocarditis, mitral valve, syphilitic   . Hypothyroidism   . Anorexia   . Dysphagia   . Reflux   . Bacteremia   . Clostridium difficile colitis   . CVA (cerebral vascular accident)   . Pressure ulcer(707.0)   . Diabetes mellitus, type 2   . Anemia   . Stroke     Past Surgical History  Procedure Date  . Colostomy   . Peg placement   . Hernia repair     Family History  Problem Relation Age of Onset  . Cancer Mother   . Other Father     old age after a fall  . Alzheimer's disease Sister     History  Substance Use Topics  . Smoking status: Never Smoker   . Smokeless tobacco: Not on file  . Alcohol Use: No      Review of Systems  Unable to perform ROS: Mental status change  Gastrointestinal: Positive for vomiting and abdominal pain.    Allergies  Review of patient's allergies indicates no known allergies.  Home Medications   Current Outpatient Rx  Name  Route  Sig  Dispense  Refill  . ACETAMINOPHEN 160 MG/5ML PO ELIX   Tube   Give 640 mg by tube every 6 (six) hours as needed. For pain            . ACETAMINOPHEN 650 MG RE SUPP   Rectal   Place 650 mg rectally every 4 (four) hours as needed. For fever         . ASPIRIN 325 MG PO TABS   Tube   Give 325 mg by tube daily.           . ATROPINE SULFATE 1 % OP SOLN   Oral   Take 2 drops by mouth every 6 (six) hours.           Marland Kitchen CITALOPRAM HYDROBROMIDE 10 MG PO TABS   Oral   Take 10 mg by mouth daily.         Marland Kitchen FERROUS SULFATE 300 (60 FE) MG/5ML PO SYRP   Tube   Give 300 mg by tube 2 (two) times daily.           Marland Kitchen GABAPENTIN 300 MG PO CAPS   Tube   Give 300 mg by tube at bedtime.           . GLYBURIDE 2.5 MG PO TABS   Oral   Take 2.5 mg by mouth 2 (two) times daily with a meal.         .  INSULIN ASPART 100 UNIT/ML [place] SOLN   Subcutaneous   Inject 5 Units into the skin 2 (two) times daily as needed. For CBG over 200         . INSULIN GLARGINE 100 UNIT/ML [place] SOLN   Subcutaneous   Inject 20 Units into the skin at bedtime.          Marland Kitchen LEVOTHYROXINE SODIUM 100 MCG PO TABS   Oral   Take 100 mcg by mouth daily.         Marland Kitchen LIOTHYRONINE SODIUM 25 MCG PO TABS   Oral   Take 25 mcg by mouth daily.         Marland Kitchen METOPROLOL TARTRATE 25 MG PO TABS   Tube   Give 25 mg by tube 2 (two) times daily.           Carma Leaven M PLUS PO TABS   Tube   Give 1 tablet by tube daily.           Marland Kitchen JEVITY 1.2 CAL PO LIQD   Per Tube   Place 1,000 mLs into feeding tube continuous.   1500 mL   0   . POTASSIUM CHLORIDE 20 MEQ/15ML (10%) PO LIQD   Tube   Give 10 mEq by tube daily.           Marland Kitchen PROMETHAZINE HCL 25 MG PO TABS   Tube   Give 25 mg by tube every 6 (six) hours as needed. For nausea         . PSEUDOEPHEDRINE HCL 30 MG PO TABS   Oral   Take 30 mg by mouth every 6 (six) hours as needed. For secretions         . RANITIDINE HCL 75 MG PO TABS   Tube   Give 150 mg by tube 2 (two) times daily.           Marland Kitchen SACCHAROMYCES BOULARDII 250 MG PO CAPS   Tube   Give 250 mg by tube 2 (two) times daily.            Marland Kitchen VITAMIN B-1 100 MG PO TABS   Tube   Give 100 mg by tube every morning.           Marland Kitchen TRAMADOL HCL 50 MG PO TABS   Tube   Give 50 mg by tube every 6 (six) hours as needed.          . TRAZODONE 25 MG HALF TABLET   Oral   Take 25 mg by mouth at bedtime.         Marland Kitchen VITAMIN C 500 MG PO TABS   Tube   Give 500 mg by tube 2 (two) times daily.           Marland Kitchen ZINC SULFATE 220 MG PO CAPS   Oral   Take 1 capsule (220 mg total) by mouth daily.   30 capsule        BP 119/70  Pulse 90  Temp 97.7 F (36.5 C) (Oral)  Resp 16  SpO2 99%  Physical Exam CONSTITUTIONAL: Well developed/well nourished HEAD AND FACE: Normocephalic/atraumatic EYES: EOMI/PERRL ENMT: Mucous membranes dry.  Dark material noted on lips NECK: supple no meningeal signs CV: S1/S2 noted LUNGS: Lungs are clear to auscultation bilaterally, no apparent distress ABDOMEN: soft, nontender, no rebound or guarding Colostomy noted - stool color brownish.  Jtube noted.  Dark material noted in tubing but hem negative GU foley in place on arrival  NEURO: Pt is awake/alert,appears confused EXTREMITIES: pulses normal SKIN: warm, color normal PSYCH: pt appears confused  ED Course  Procedures   Labs Reviewed  CBC WITH DIFFERENTIAL - Abnormal; Notable for the following:    RBC 3.68 (*)     Hemoglobin 8.7 (*)     HCT 28.6 (*)     MCV 77.7 (*)     MCH 23.6 (*)     RDW 18.3 (*)     Monocytes Relative 14 (*)     Monocytes Absolute 1.2 (*)     All other components within normal limits  COMPREHENSIVE METABOLIC PANEL - Abnormal; Notable for the following:    Potassium 5.4 (*)  HEMOLYSIS AT THIS LEVEL MAY AFFECT RESULT   Glucose, Bld 238 (*)     BUN 41 (*)     Total Protein 8.9 (*)     Albumin 2.3 (*)     AST 44 (*)     GFR calc non Af Amer 84 (*)     All other components within normal limits  LACTIC ACID, PLASMA - Abnormal; Notable for the following:    Lactic Acid, Venous 2.3 (*)     All other components  within normal limits  TYPE AND SCREEN    2:30 AM Pt poor historian, suspect his AMS is actually his baseline It is reported he vomited blood at nursing facility.  He has h/o GI bleed (EGd/colonoscopy in October 2012, performed by [facility], no active source of blood noted) He is anemic.  Will need admission for rehydration/monitoring and serial lab draws to evaluate his anemia.  He is currently stable  MDM  Nursing notes including past medical history and social history reviewed and considered in documentation Labs/vital reviewed and considered Previous records reviewed and considered - EGD/Colonoscopy in 2012         Joya Gaskins, MD 04/13/12 [id number]

## 2012-04-13 NOTE — ED Notes (Signed)
Patients 81f foley cath leaking severely. Bedding saturate and changed. 25 ml of saline removed fm balloon. Foley cath D/C and replaced with 32mm condom cath.

## 2012-04-13 NOTE — Progress Notes (Addendum)
[facility] PROGRESS NOTE  [name] ZOX:[id number] DOB: 23-Aug-1935 DOA: 04/12/2012 PCP: No primary provider on file.  Assessment/Plan: Principal Problem:  *GI bleed Active Problems:  Dysphagia due to old stroke  Hypothyroidism  DM (diabetes mellitus)  Altered mental state  Hyperkalemia    Questionable GI Bleed HEMOPTYSIS- 1 episode of vomiting, stool does not appear to be bloody, substance from G tube checked but came back hemeoccult negative x1. Rechecking the substance again and empirically treating with PPI gtt and q8h h/h in the meantime. Alternatively the substance could be his Iron syrup supplement that he takes which has been suggested to give the appearance of blood in the GI tract. willobtain CT CHEST ABDOMEN , PELVIS, CONTRAST THROUGH J TUBE , J TUBE CLOGGED?   Altered mental status, will admit for an observation visit until this becomes more clear. If he does have a GI bleed likely will need endoscopy so will make patient NPO for now.   Dysphagia due to old stroke -will get speech therapy evaluation   Hypothyroidism - check TSH, continue thyroid replacement therapy   DM - putting patient on med dose SSI Q4H while NPO here, holding home meds   AMS - according to patients son his current mental status could well be his baseline, while he does have good days and bad it is not uncommon for him to make comments as noted in the physical exam.   Hyperkalemia - no evidence of renal failure, will hold his home potassium. Give 1 dose of Paxil 8 by rectal tube   Anemia - HGB 8.7 down from 9.9 on 10/31, monitoring with q4h h/h, no indications for transfusion at this time. Hemoglobin at baseline   Code Status: full Family Communication: family updated about patient's clinical progress Disposition Plan:  As above    Brief narrative: *[name] is a 76 y.o. male brought in by his nursing home with c/o hematemesis, per his nursing home he had a single episode of  hematemesis characterized by vomiting up dark blood. Patient unfortunately is unable to add much to his history taking at this time as he has significant AMS possibly due to delirium, although with that said his mental status is apparently like this some days at baseline according to his son [name] whom I spoke with on the phone (has good days and bad days   Consultants:  None  Procedures:  None  Antibiotics:  None  HPI/Subjective: No signs of active bleeding overnight,  Objective: Filed Vitals:   04/13/12 0346 04/13/12 0415 04/13/12 0500 04/13/12 0543  BP: 126/86 113/79  117/84  Pulse: 91 89  94  Temp:    98.3 F (36.8 C)  TempSrc:    Oral  Resp: 21 16  18   Height:    5\' 10"  (1.778 m)  Weight:   95.255 kg (210 lb)   SpO2: 100% 100%  100%   No intake or output data in the 24 hours ending 04/13/12 0931  Exam:  General: NAD, resting comfortably in bed  Eyes: PEERLA EOMI, facial tic with eye closing noted (also noted on prior exam)  ENT: black / redish substance possibly digested blood in mouth and lips  Neck: supple w/o JVD  Cardiovascular: RRR w/o MRG  Respiratory: CTA B  Abdomen: soft, nt, nd, bs+, blackish substance in G tube  Skin: no rash nor lesion  Musculoskeletal: MAE, full ROM all 4 extremities  Psychiatric: normal tone and affect  Neurologic: alert, not oriented (patient  thinks that he was just at his 15 year old mothers funeral yesterday)    Data Reviewed: Basic Metabolic Panel:  Lab 04/12/12 1610  NA 136  K 5.4*  CL 99  CO2 27  GLUCOSE 238*  BUN 41*  CREATININE 0.85  CALCIUM 10.4  MG --  PHOS --    Liver Function Tests:  Lab 04/12/12 2219  AST 44*  ALT 26  ALKPHOS 96  BILITOT 0.3  PROT 8.9*  ALBUMIN 2.3*   No results found for this basename: LIPASE:5,AMYLASE:5 in the last 168 hours No results found for this basename: AMMONIA:5 in the last 168 hours  CBC:  Lab 04/12/12 2219  WBC 8.5  NEUTROABS 5.2  HGB 8.7*  HCT 28.6*  MCV  77.7*  PLT 323    Cardiac Enzymes: No results found for this basename: CKTOTAL:5,CKMB:5,CKMBINDEX:5,TROPONINI:5 in the last 168 hours BNP (last 3 results) No results found for this basename: PROBNP:3 in the last 8760 hours   CBG:  Lab 04/13/12 0815  GLUCAP 146*    No results found for this or any previous visit (from the past 240 hour(s)).   Studies: No results found.  Scheduled Meds:   . antiseptic oral rinse  15 mL Mouth Rinse BID  . atropine  2 drop Sublingual Q6H  . citalopram  10 mg Oral Daily  . ferrous sulfate  300 mg Per Tube BID  . gabapentin  300 mg Oral QHS  . insulin aspart  0-15 Units Subcutaneous Q4H  . insulin glargine  20 Units Subcutaneous QHS  . levothyroxine  100 mcg Oral QAC breakfast  . liothyronine  25 mcg Oral QAC breakfast  . metoprolol tartrate  25 mg Oral BID  . saccharomyces boulardii  250 mg Oral BID  . traZODone  25 mg Oral QHS  . vitamin C  500 mg Oral BID  . zinc sulfate  220 mg Oral Daily   Continuous Infusions:   . pantoprozole (PROTONIX) infusion 8 mg/hr (04/13/12 0531)    Principal Problem:  *GI bleed Active Problems:  Dysphagia due to old stroke  Hypothyroidism  DM (diabetes mellitus)  Altered mental state  Hyperkalemia    Time spent: 40 minutes   [facility]  [facility] Pager [phone]. If 8PM-8AM, please contact night-coverage at [email or web address], password [facility] 04/13/2012, 9:31 AM  LOS: 1 day

## 2012-04-13 NOTE — ED Notes (Signed)
Admitting MD at pt bedside

## 2012-04-13 NOTE — ED Notes (Signed)
From [facility]

## 2012-04-13 NOTE — H&P (Signed)
[facility] History and Physical  CHANEL MCKESSON [name]:[id number] DOB: 1935/11/09 DOA: 04/12/2012  Referring physician: ED PCP: No primary provider on file.  Specialists: None  Chief Complaint: Hematemesis  HPI: [name] is a 76 y.o. male brought in by his nursing home with c/o hematemesis, per his nursing home he had a single episode of hematemesis characterized by vomiting up dark blood.  Patient unfortunately is unable to add much to his history taking at this time as he has significant AMS possibly due to delirium, although with that said his mental status is apparently like this some days at baseline according to his son [name] whom I spoke with on the phone (has good days and bad days).  Review of Systems: Unable to perform due to AMS (though suspect this AMS is patients baseline).  Past Medical History  Diagnosis Date  . Stroke     Hx of with right hemiparesis  . Hypertension   . Endocarditis, mitral valve, syphilitic   . Hypothyroidism   . Anorexia   . Dysphagia   . Reflux   . Bacteremia   . Clostridium difficile colitis   . CVA (cerebral vascular accident)   . Pressure ulcer(707.0)   . Diabetes mellitus, type 2   . Anemia   . Stroke    Past Surgical History  Procedure Date  . Colostomy   . Peg placement   . Hernia repair    Social History:  reports that he has never smoked. He does not have any smokeless tobacco history on file. He reports that he does not drink alcohol or use illicit drugs.  No Known Allergies  Family History  Problem Relation Age of Onset  . Cancer Mother   . Other Father     old age after a fall  . Alzheimer's disease Sister     Prior to Admission medications   Medication Sig Start Date End Date Taking? Authorizing Provider  acetaminophen (TYLENOL) 160 MG/5ML elixir Give 640 mg by tube every 6 (six) hours as needed. For pain    Yes Historical Provider, MD  acetaminophen (TYLENOL) 650 MG suppository Place 650 mg rectally every 4  (four) hours as needed. For fever   Yes Historical Provider, MD  aspirin 325 MG tablet Give 325 mg by tube daily.     Yes Historical Provider, MD  atropine 1 % ophthalmic solution Take 2 drops by mouth every 6 (six) hours.     Yes Historical Provider, MD  citalopram (CELEXA) 10 MG tablet Take 10 mg by mouth daily.   Yes Historical Provider, MD  ferrous sulfate 300 (60 FE) MG/5ML syrup Give 300 mg by tube 2 (two) times daily.     Yes Historical Provider, MD  gabapentin (NEURONTIN) 300 MG capsule Give 300 mg by tube at bedtime.     Yes Historical Provider, MD  glyBURIDE (DIABETA) 2.5 MG tablet Take 2.5 mg by mouth 2 (two) times daily with a meal.   Yes Historical Provider, MD  insulin aspart (NOVOLOG) 100 UNIT/ML injection Inject 5 Units into the skin 2 (two) times daily as needed. For CBG over 200   Yes Historical Provider, MD  insulin glargine (LANTUS) 100 UNIT/ML injection Inject 20 Units into the skin at bedtime.    Yes Historical Provider, MD  levothyroxine (SYNTHROID, LEVOTHROID) 100 MCG tablet Take 100 mcg by mouth daily.   Yes Historical Provider, MD  liothyronine (CYTOMEL) 25 MCG tablet Take 25 mcg by mouth daily.   Yes  Historical Provider, MD  metoprolol tartrate (LOPRESSOR) 25 MG tablet Give 25 mg by tube 2 (two) times daily.     Yes Historical Provider, MD  Multiple Vitamins-Minerals (MULTIVITAMINS THER. W/MINERALS) TABS Give 1 tablet by tube daily.     Yes Historical Provider, MD  Nutritional Supplements (FEEDING SUPPLEMENT, JEVITY 1.2 CAL,) LIQD Place 1,000 mLs into feeding tube continuous. 02/16/12  Yes Rhetta Mura, MD  potassium chloride 20 MEQ/15ML (10%) solution Give 10 mEq by tube daily.     Yes Historical Provider, MD  promethazine (PHENERGAN) 25 MG tablet Give 25 mg by tube every 6 (six) hours as needed. For nausea   Yes Historical Provider, MD  pseudoephedrine (SUDAFED) 30 MG tablet Take 30 mg by mouth every 6 (six) hours as needed. For secretions   Yes Historical Provider,  MD  ranitidine (ZANTAC) 75 MG tablet Give 150 mg by tube 2 (two) times daily.     Yes Historical Provider, MD  saccharomyces boulardii (FLORASTOR) 250 MG capsule Give 250 mg by tube 2 (two) times daily.     Yes Historical Provider, MD  Thiamine HCl (VITAMIN B-1) 100 MG tablet Give 100 mg by tube every morning.     Yes Historical Provider, MD  traMADol (ULTRAM) 50 MG tablet Give 50 mg by tube every 6 (six) hours as needed.    Yes Historical Provider, MD  traZODone (DESYREL) 25 mg TABS Take 25 mg by mouth at bedtime.   Yes Historical Provider, MD  vitamin C (ASCORBIC ACID) 500 MG tablet Give 500 mg by tube 2 (two) times daily.     Yes Historical Provider, MD  zinc sulfate 220 MG capsule Take 1 capsule (220 mg total) by mouth daily. 02/16/12  Yes Rhetta Mura, MD   Physical Exam: Filed Vitals:   04/13/12 0115 04/13/12 0330 04/13/12 0346 04/13/12 0415  BP: 119/70 115/75 126/86 113/79  Pulse: 90 91 91 89  Temp:      TempSrc:      Resp: 16 18 21 16   SpO2: 99% 100% 100% 100%     General:  NAD, resting comfortably in bed  Eyes: PEERLA EOMI, facial tic with eye closing noted (also noted on prior exam)  ENT: black / redish substance possibly digested blood in mouth and lips  Neck: supple w/o JVD  Cardiovascular: RRR w/o MRG  Respiratory: CTA B  Abdomen: soft, nt, nd, bs+, blackish substance in G tube  Skin: no rash nor lesion  Musculoskeletal: MAE, full ROM all 4 extremities  Psychiatric: normal tone and affect  Neurologic: alert, not oriented (patient thinks that he was just at his 70 year old mothers funeral yesterday)   Labs on Admission:  Basic Metabolic Panel:  Lab 04/12/12 1191  NA 136  K 5.4*  CL 99  CO2 27  GLUCOSE 238*  BUN 41*  CREATININE 0.85  CALCIUM 10.4  MG --  PHOS --   Liver Function Tests:  Lab 04/12/12 2219  AST 44*  ALT 26  ALKPHOS 96  BILITOT 0.3  PROT 8.9*  ALBUMIN 2.3*   No results found for this basename: LIPASE:5,AMYLASE:5 in  the last 168 hours No results found for this basename: AMMONIA:5 in the last 168 hours CBC:  Lab 04/12/12 2219  WBC 8.5  NEUTROABS 5.2  HGB 8.7*  HCT 28.6*  MCV 77.7*  PLT 323   Cardiac Enzymes: No results found for this basename: CKTOTAL:5,CKMB:5,CKMBINDEX:5,TROPONINI:5 in the last 168 hours  BNP (last 3 results) No results found  for this basename: PROBNP:3 in the last 8760 hours CBG: No results found for this basename: GLUCAP:5 in the last 168 hours  Radiological Exams on Admission: No results found.  EKG: Independently reviewed.  Assessment/Plan Principal Problem:  *GI bleed Active Problems:  Dysphagia due to old stroke  Hypothyroidism  DM (diabetes mellitus)  Altered mental state  Hyperkalemia   1. Questionable GI Bleed - 1 episode of vomiting, stool does not appear to be bloody, substance from G tube checked but came back hemeoccult negative x1.  Rechecking the substance again and empirically treating with PPI gtt and q8h h/h in the meantime.  Alternatively the substance could be his Iron syrup supplement that he takes which has been suggested to give the appearance of blood in the GI tract.  Never the less since the patient cant give [place] much history and appears to be delirious, will admit for an observation visit until this becomes more clear.  If he does have a GI bleed likely will need endoscopy so will make patient NPO for now. 2. Dysphagia due to old stroke - concerning given recent episode of vomiting, no evidence of aspiration at this time but patient at risk in next couple of days to develop aspiration PNA. 3. Hypothyroidism - check TSH, continue thyroid replacement therapy 4. DM - putting patient on med dose SSI Q4H while NPO here, holding home meds 5. AMS - according to patients son his current mental status could well be his baseline, while he does have good days and bad it is not uncommon for him to make comments as noted in the physical exam. 6. Hyperkalemia  - no evidence of renal failure, will hold his home potassium. 7. Anemia - HGB 8.7 down from 9.9 on 10/31, monitoring with q4h h/h, no indications for transfusion at this time.  No consults obtained.  Code Status: Full code per information sent in from NH and most recent code status in our system on 11/1 see DC summary on 02/12/12 for a full discussion of this (must indicate code status--if unknown or must be presumed, indicate so) Family Communication: Spoke with [name], please contact him at [phone] (indicate person spoken with, if applicable, with phone number if by telephone) Disposition Plan: Admit to obs (indicate anticipated LOS)  Time spent: 70 min  GARDNER, JARED M. [facility] Pager [phone]  If 7PM-7AM, please contact night-coverage [email or web address] Password [facility] 04/13/2012, 4:34 AM

## 2012-04-14 ENCOUNTER — Observation Stay (HOSPITAL_COMMUNITY): Payer: Medicare Other

## 2012-04-14 LAB — GLUCOSE, CAPILLARY

## 2012-04-14 LAB — CBC
MCV: 78.2 fL (ref 78.0–100.0)
Platelets: 348 10*3/uL (ref 150–400)
RBC: 3.81 MIL/uL — ABNORMAL LOW (ref 4.22–5.81)
WBC: 7.5 10*3/uL (ref 4.0–10.5)

## 2012-04-14 LAB — OCCULT BLOOD X 1 CARD TO LAB, STOOL: Fecal Occult Bld: NEGATIVE

## 2012-04-14 LAB — BASIC METABOLIC PANEL
CO2: 21 mEq/L (ref 19–32)
GFR calc non Af Amer: 82 mL/min — ABNORMAL LOW (ref >90)
Glucose, Bld: 112 mg/dL — ABNORMAL HIGH (ref 70–99)
Potassium: 4.1 mEq/L (ref 3.5–5.1)
Sodium: 140 mEq/L (ref 135–145)

## 2012-04-14 LAB — HEMOGLOBIN AND HEMATOCRIT, BLOOD
HCT: 31.4 % — ABNORMAL LOW (ref 39.0–52.0)
Hemoglobin: 9.6 g/dL — ABNORMAL LOW (ref 13.0–17.0)

## 2012-04-14 LAB — OCCULT BLOOD, POC DEVICE: Fecal Occult Bld: NEGATIVE

## 2012-04-14 MED ORDER — GLUCERNA 1.2 CAL PO LIQD
1000.0000 mL | ORAL | Status: DC
  Administered 2012-04-14 – 2012-04-16 (×3): 1000 mL
  Filled 2012-04-14 (×8): qty 1000

## 2012-04-14 MED ORDER — IOHEXOL 300 MG/ML  SOLN
100.0000 mL | Freq: Once | INTRAMUSCULAR | Status: AC | PRN
  Administered 2012-04-14: 100 mL via INTRAVENOUS

## 2012-04-14 MED ORDER — PRO-STAT SUGAR FREE PO LIQD
30.0000 mL | Freq: Two times a day (BID) | ORAL | Status: DC
  Administered 2012-04-14 – 2012-04-17 (×6): 30 mL
  Filled 2012-04-14 (×8): qty 30

## 2012-04-14 MED ORDER — SODIUM CHLORIDE 0.9 % IV SOLN
INTRAVENOUS | Status: DC
  Administered 2012-04-14: 11:00:00 via INTRAVENOUS
  Administered 2012-04-17: 75 mL/h via INTRAVENOUS

## 2012-04-14 NOTE — Progress Notes (Signed)
SLP Cancellation Note  Patient Details Name: LAVELL SUPPLE MRN: [id number] DOB: 1935/12/13   Cancelled treatment:       Reason Eval/Treat Not Completed: Other (comment) (Pt refused)  Pt with a history of dysphagia after CVA and cervical fusion. Medical record and family report that pt was consuming POs prior to admit with known risk of aspiration (dys 2/thin liquids) but had stopped eating recently. Son was not willing to verbalize that pt should initiate a diet at this time, requested assessment first. SLP attempted assessment but pt firmly refused. Son stated that he hoped a family member would be there soon to help. Will reattempt tomorrow.  Even at his best, pt at high risk of aspriation given history. Consider J tube for nutrition/meds until family verbalizes acceptance of risk of aspiration.   Harlon Ditty, MA CCC-SLP [phone]  Claudine Mouton 04/14/2012, 10:15 AM

## 2012-04-14 NOTE — Evaluation (Signed)
Physical Therapy Evaluation Patient Details Name: [name] MRN: [id number] DOB: [birth year] Today's Date: 04/14/2012 Time: 1308-6578 PT Time Calculation (min): 15 min  PT Assessment / Plan / Recommendation Clinical Impression  Pt is a 76 yo male who resides in SNF. Patient extremely debilitated with R UE contractures and bilat knee contractures. Patient with minimal volitional mvmt although reports he gets OOB at [facility]. pt with questionable history due to h/o deliurum. Patient remains appropriate to return to SNF due to patient total dependent for all mobility and ADLs.    PT Assessment  Patient needs continued PT services    Follow Up Recommendations  SNF;Supervision/Assistance - 24 hour    Does the patient have the potential to tolerate intense rehabilitation      Barriers to Discharge        Equipment Recommendations       Recommendations for Other Services     Frequency Min 2X/week    Precautions / Restrictions Precautions Precautions: Fall Restrictions Weight Bearing Restrictions: No   Pain: bilat LEs, did not rate      Mobility  Bed Mobility Bed Mobility: Rolling Right Rolling Right: 1: +2 Total assist Rolling Right: Patient Percentage: 10% Details for Bed Mobility Assistance: pt deferred all other mobility due to LE pain and "I"m comfortable." Transfers Transfers: Not assessed Ambulation/Gait Ambulation/Gait Assistance: Not tested (comment)    Shoulder Instructions     Exercises     PT Diagnosis: Generalized weakness  PT Problem List: Decreased strength;Decreased activity tolerance;Decreased balance;Decreased mobility PT Treatment Interventions: Balance training;Functional mobility training;Therapeutic activities;Therapeutic exercise   PT Goals Acute Rehab PT Goals PT Goal Formulation: With patient Time For Goal Achievement: 04/21/12 Potential to Achieve Goals: Fair Pt will Roll Supine to Right Side: with max assist;with rail PT Goal: Rolling  Supine to Right Side - Progress: Goal set today Pt will go Supine/Side to Sit: with +1 total assist;with rail PT Goal: Supine/Side to Sit - Progress: Goal set today Pt will Sit at Edge of Bed: with mod assist;with bilateral upper extremity support;1-2 min PT Goal: Sit at Edge Of Bed - Progress: Goal set today Pt will go Sit to Stand: with +1 total assist;with upper extremity assist PT Goal: Sit to Stand - Progress: Goal set today  Visit Information  Last PT Received On: 04/14/12 Assistance Needed: +2    Subjective Data  Subjective: Pt received supine in bed with request for "Can I please have a sip of water."   Prior Functioning  Home Living Lives With:  (SNF) Available Help at Discharge: Skilled Nursing Facility Type of Home: Skilled Nursing Facility Additional Comments: per patient "I'm going home after this." pt with history of deliurm. Per chart pt resides in RN. Prior Function Level of Independence: Needs assistance Comments: appears pt to be bed-bound/w/c bound Dominant Hand: Left    Cognition  Overall Cognitive Status: History of cognitive impairments - at baseline Arousal/Alertness: Awake/alert Orientation Level: Disoriented to;Place;Time;Situation Behavior During Session: [facility] for tasks performed    Extremity/Trunk Assessment Right Upper Extremity Assessment RUE ROM/Strength/Tone: Deficits RUE ROM/Strength/Tone Deficits: pt R UE very rigid, h/o stroke, no active/passive shld flex, limited wrist, and 30 deg passive elbow flex, all finger in extensor position Left Upper Extremity Assessment LUE ROM/Strength/Tone: Deficits LUE ROM/Strength/Tone Deficits: shoulder flex 30 deg actively Right Lower Extremity Assessment RLE ROM/Strength/Tone: Deficits RLE ROM/Strength/Tone Deficits: grossly strength 2/5, knee flex to 90 Left Lower Extremity Assessment LLE ROM/Strength/Tone: Deficits LLE ROM/Strength/Tone Deficits: grossly 2-/5, limited hip/knee flex by  pain Trunk  Assessment Trunk Assessment:  (unable to assess)   Balance    End of Session PT - End of Session Activity Tolerance: Patient limited by fatigue Patient left: in bed;with call bell/phone within reach Nurse Communication: Mobility status  GP Functional Assessment Tool Used: clinical judgement Functional Limitation: Mobility: Walking and moving around Mobility: Walking and Moving Around Current Status (Z6109): At least 80 percent but less than 100 percent impaired, limited or restricted Mobility: Walking and Moving Around Goal Status [id number]): At least 40 percent but less than 60 percent impaired, limited or restricted   Marcene Brawn 04/14/2012, 5:41 PM  Lewis Shock, PT, DPT Pager #: [phone] Office #: [phone]

## 2012-04-14 NOTE — Progress Notes (Addendum)
INITIAL NUTRITION ASSESSMENT  DOCUMENTATION CODES Per approved criteria  -Obesity Unspecified   INTERVENTION: 1. Initiate Glucerna 1.2 at 15 ml/hr via PEG, advance by 10 ml/hr every 4 hours, to goal rate of 75 ml/hr. 30 ml Prostat liquid protein via tube BID. Total goal rate and protein supplement will provide 2360 kcals, 138 gm protein, 1449 ml of free water. 2. RD to continue to follow nutrition care plan  NUTRITION DIAGNOSIS: Inadequate oral intake related to hx of dysphagia as evidenced by use of PEG for main source of nutrition.  Goal: Pt to meet >/= 90% of their estimated nutrition needs.  Monitor:  weight trends, lab trends, I/O's, SLP eval/rec's; TF initiation/tolerance  Reason for Assessment: MD Consult for Initiation/Management of Enteral Nutrition  76 y.o. male  Admitting Dx: GI bleed  ASSESSMENT: Admitted with GIB from SNF. 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 Medical record and family report that pt was consuming POs prior to admit with known risk of aspiration (dys 2/thin liquids) but had stopped eating recently. SLP attempted to see pt today, planning to see tomorrow.  RD consulted to initiate enteral nutrition. Per nursing home records, pt receives Diabetesource at 150 ml/hr x 20 hours with 30 ml Prostat via tube BID. This regimen provides: 3800 kcal, 210 grams protein, and 2454 ml free water.   Height: Ht Readings from Last 1 Encounters:  04/13/12 5\' 10"  (1.778 m)    Weight: Wt Readings from Last 1 Encounters:  04/13/12 210 lb (95.255 kg)    Ideal Body Weight: 75.5 kg/166 lb  % Ideal Body Weight: 127%  Wt Readings from Last 10 Encounters:  04/13/12 210 lb (95.255 kg)  02/12/12 210 lb 1.6 oz (95.3 kg)  06/18/11 219 lb (99.338 kg)  05/22/11 210 lb (95.255 kg)  02/20/11 219 lb 12.8 oz (99.7 kg)    Usual Body Weight: 220 lb  % Usual Body Weight: 95%  BMI:  Body mass index is 30.13 kg/(m^2). Obese Class I  Estimated Nutritional  Needs: Kcal: 2100 - 2300 kcal Protein: 115 - 130 grams protein Fluid: 2 - 2.2 liters daily  Skin: stage IV sacrum  Diet Order: NPO  EDUCATION NEEDS: -No education needs identified at this time   Intake/Output Summary (Last 24 hours) at 04/14/12 1121 Last data filed at 04/14/12 0930  Gross per 24 hour  Intake      0 ml  Output   1900 ml  Net  -1900 ml    Last BM: 12/30  Labs:   Lab 04/13/12 0957 04/12/12 2219  NA 144143 136  K 3.83.8 5.4*  CL 107106 99  CO2 2424 27  BUN 40*40* 41*  CREATININE 0.880.91 0.85  CALCIUM 10.010.0 10.4  MG -- --  PHOS -- --  GLUCOSE 139*147* 238*    CBG (last 3)   Basename 04/14/12 0742 04/14/12 0558 04/14/12 0023  GLUCAP 133* 126* 149*    Scheduled Meds:   . antiseptic oral rinse  15 mL Mouth Rinse BID  . atropine  2 drop Sublingual Q6H  . citalopram  10 mg Oral Daily  . ferrous sulfate  300 mg Per Tube BID  . gabapentin  300 mg Oral QHS  . insulin aspart  0-15 Units Subcutaneous Q4H  . insulin glargine  20 Units Subcutaneous QHS  . levothyroxine  100 mcg Oral QAC breakfast  . liothyronine  25 mcg Oral QAC breakfast  . metoprolol tartrate  25 mg Oral BID  . nystatin  Topical BID  . saccharomyces boulardii  250 mg Oral BID  . traZODone  25 mg Oral QHS  . vitamin C  500 mg Oral BID  . zinc sulfate  220 mg Oral Daily    Continuous Infusions:   . sodium chloride    . pantoprozole (PROTONIX) infusion 8 mg/hr (04/14/12 0500)    Past Medical History  Diagnosis Date  . Stroke     Hx of with right hemiparesis  . Hypertension   . Endocarditis, mitral valve, syphilitic   . Hypothyroidism   . Anorexia   . Dysphagia   . Reflux   . Bacteremia   . Clostridium difficile colitis   . CVA (cerebral vascular accident)   . Pressure ulcer(707.0)   . Diabetes mellitus, type 2   . Anemia   . Stroke     Past Surgical History  Procedure Date  . Colostomy   . Peg placement   . Hernia repair     Jarold Motto  MS, RD, LDN Pager: [phone] After-hours pager: [phone]

## 2012-04-14 NOTE — Consult Note (Signed)
WOC consult Reason for Consult: eval sacral pressure ulcer. Pt known to this WOC from previous evaluation in Oct. 2013. Was followed by Wound Care center for some time for chronic Stage IV pressure ulcer. Pt from SNF.  Pt has had diverting ostomy to allow his pressure ulcer to heal. He has LALM at the facility per his previous reports, his baseline mental status is questionable.  At first I requested pts permission to assess the wound and the patient refused, after discussing this with the bedside nurse the pt was agreeable to let me assess.   Wound type: Stage IV pressure ulcer-sacrum, palpable bone but not exposed  Pressure Ulcer POA: Yes  Measurement: 6cm x 3.5cm x 4cm Wound bed: pink, moist, limited granulation with large amount of bone palpable in the wound bed center, tissue is dark but not necrotic, does have some tunneling at 6 o'clock that is the deepest measurable site (4cm ) Drainage (amount, consistency, odor) serosanguinous, no odor Periwound: scarring and reepithelialization some maceration of the periwound edges  Dressing procedure/placement/frequency: will order normal saline moist to moist dressings. Air mattress in place. Will need long term follow up in a wound care center.   Re consult if needed, will not follow at this time. Thanks  Gerilynn Mccullars [employer], CWOCN [phone])

## 2012-04-14 NOTE — Progress Notes (Signed)
[facility] PROGRESS NOTE  HENSON FRATICELLI ZOX:[id number] DOB: 1935-09-30 DOA: 04/12/2012 PCP: No primary provider on file.  Assessment/Plan: Principal Problem:  *GI bleed Active Problems:  Dysphagia due to old stroke  Hypothyroidism  DM (diabetes mellitus)  Altered mental state  Hyperkalemia    Questionable GI Bleed HEMOPTYSIS- 1 episode of vomiting, stool does not appear to be bloody, substance from G tube checked but came back hemeoccult negative x1. Rechecking the substance again and empirically treating with PPI gtt and q8h h/h in the meantime. Alternatively the substance could be his Iron syrup supplement that he takes which has been suggested to give the appearance of blood in the GI tract. willobtain CT CHEST ABDOMEN , PELVIS, CONTRAST THROUGH J TUBE , J TUBE CLOGGED? Shows no abnormalities on chest CT, abdominal CT shows gastrojejunostomy tube to be in good position. We'll order CT of the head and neck, could be postnasal bleeding. If negative will consult ENT,  Altered mental status, will admit for an observation visit until this becomes more clear. If he does have a GI bleed likely will need endoscopy so will make patient NPO for now.   Dysphagia due to old stroke -will get speech therapy evaluation   Hypothyroidism - check TSH, continue thyroid replacement therapy   DM - putting patient on med dose SSI Q4H while NPO here, holding home meds   AMS - according to patients son his current mental status could well be his baseline, while he does have good days and bad it is not uncommon for him to make comments as noted in the physical exam.   Hyperkalemia - no evidence of renal failure, will hold his home potassium. Give 1 dose of Paxil 8 by rectal tube   Anemia - HGB 8.7 down from 9.9 on 10/31, monitoring with q4h h/h, no indications for transfusion at this time. Hemoglobin at baseline   Code Status: full  Family Communication: family updated about patient's clinical  progress  Disposition Plan: As above  Brief narrative:  *[name] is a 76 y.o. male brought in by his nursing home with c/o hematemesis, per his nursing home he had a single episode of hematemesis characterized by vomiting up dark blood. Patient unfortunately is unable to add much to his history taking at this time as he has significant AMS possibly due to delirium, although with that said his mental status is apparently like this some days at baseline according to his son [name] whom I spoke with on the phone (has good days and bad days  Consultants:  None Procedures:  None Antibiotics:  None HPI/Subjective:  Dried blood with oral suctioning  Objective: Filed Vitals:   04/13/12 1017 04/13/12 1500 04/13/12 2101 04/14/12 0600  BP: 106/59 111/75 124/78 141/83  Pulse: 94 95 89 81  Temp: 98.5 F (36.9 C) 98.8 F (37.1 C) 98.1 F (36.7 C) 100.2 F (37.9 C)  TempSrc: Oral Oral Oral Oral  Resp: 20 18 18 18   Height:      Weight:      SpO2: 99% 100% 100% 100%    Intake/Output Summary (Last 24 hours) at 04/14/12 1000 Last data filed at 04/14/12 0601  Gross per 24 hour  Intake      0 ml  Output   1900 ml  Net  -1900 ml    Exam:  HENT:  Head: Atraumatic.  Nose: Nose normal.  Mouth/Throat: Oropharynx is clear and moist.  Eyes: Conjunctivae are normal. Pupils are equal, round,  and reactive to light. No scleral icterus.  Neck: Neck supple. No tracheal deviation present.  Cardiovascular: Normal rate, regular rhythm, normal heart sounds and intact distal pulses.  Pulmonary/Chest: Effort normal and breath sounds normal. No respiratory distress.  Abdominal: Soft. Normal appearance and bowel sounds are normal. She exhibits no distension. There is no tenderness.  Musculoskeletal: She exhibits no edema and no tenderness.  Neurological: She is alert. No cranial nerve deficit.    Data Reviewed: Basic Metabolic Panel:  Lab 04/13/12 4540 04/12/12 2219  NA 144143 136  K 3.83.8  5.4*  CL 107106 99  CO2 2424 27  GLUCOSE 139*147* 238*  BUN 40*40* 41*  CREATININE 0.880.91 0.85  CALCIUM 10.010.0 10.4  MG -- --  PHOS -- --    Liver Function Tests:  Lab 04/13/12 0957 04/12/12 2219  AST 31 44*  ALT 25 26  ALKPHOS 95 96  BILITOT 0.4 0.3  PROT 8.9* 8.9*  ALBUMIN 2.3* 2.3*   No results found for this basename: LIPASE:5,AMYLASE:5 in the last 168 hours No results found for this basename: AMMONIA:5 in the last 168 hours  CBC:  Lab 04/14/12 0624 04/13/12 1307 04/13/12 0957 04/12/12 2219  WBC 7.5 -- -- 8.5  NEUTROABS -- -- -- 5.2  HGB 9.3* 9.3* 9.4* 8.7*  HCT 29.8* 30.0* 29.6* 28.6*  MCV 78.2 -- -- 77.7*  PLT 348 -- -- 323    Cardiac Enzymes: No results found for this basename: CKTOTAL:5,CKMB:5,CKMBINDEX:5,TROPONINI:5 in the last 168 hours BNP (last 3 results) No results found for this basename: PROBNP:3 in the last 8760 hours   CBG:  Lab 04/14/12 0742 04/14/12 0558 04/14/12 0023 04/13/12 2055 04/13/12 1712  GLUCAP 133* 126* 149* 143* 150*    No results found for this or any previous visit (from the past 240 hour(s)).   Studies: Ct Abdomen Pelvis Wo Contrast  04/13/2012  *RADIOLOGY REPORT*  Clinical Data:  Questionable GI bleeding.  Mass.  Contrast was given through a J tube.  The patient is vomiting blood.  Hematuria. Low hemoglobin.  History of diabetes, stroke, colostomy, hernia repair  CT CHEST, ABDOMEN AND PELVIS WITHOUT CONTRAST  Technique:  Multidetector CT imaging of the chest, abdomen and pelvis was performed following the standard protocol without IV contrast.  Comparison:  CT of the abdomen and pelvis with contrast 02/12/2012  CT CHEST  Findings:  The patient has right-sided transvenous pacemaker with leads to the right atrium and right ventricle.  Heart size is normal.  There is no mediastinal, hilar, or axillary adenopathy. Small calcified nodules are identified within the lungs, consistent with calcified granulomata.  There are no  focal consolidations or pleural effusions.  No suspicious lytic or blastic lesions are identified in the thoracic spine.  IMPRESSION: No evidence for acute abnormality of the chest.  CT ABDOMEN AND PELVIS  Findings:  The the patient has a gastrojejunostomy.  The jejunal tip is in appropriate position in the proximal jejunum.  A left lower quadrant ostomy and mucus fistula are unremarkable in appearance except for scattered colonic diverticula.  The patient has a left lower quadrant port for penile implant.  No focal abnormality identified within the liver, spleen, pancreas, or adrenal glands.  Nonobstructing intrarenal calculus is identified in the left kidney, measuring 5 mm in diameter.  No evidence for ureteral stones.  The gallbladder is surgically absent.  No evidence for bowel dilatation or bowel wall thickening. The appendix is well seen and has a normal appearance.  No bowel  obstruction.  Contrast reaches the colostomy bag prior to the procedure.  There continues to be significant soft tissue density surrounding the left sacroiliac joint, associated with bony erosion.  This appears to have progressed since prior study.  There is associated sclerosis in the SI joint.  Differential diagnosis includes infection or metastasis.  Urinary bladder has a normal appearance. No retroperitoneal or mesenteric adenopathy.  The abdominal aorta is calcified but not aneurysmal.  IMPRESSION:  1.  Gastrojejunostomy tube appears been good position. 2.  Postoperative changes. 3.  Left SI joint soft tissue and bony process as described may represent infectious process or neoplasm.  This appears to have progressed since prior study. 4.  Nonobstructing left intrarenal calculus.   Original Report Authenticated By: Norva Pavlov, M.D.    Ct Chest Wo Contrast  04/13/2012  *RADIOLOGY REPORT*  Clinical Data:  Questionable GI bleeding.  Mass.  Contrast was given through a J tube.  The patient is vomiting blood.  Hematuria. Low  hemoglobin.  History of diabetes, stroke, colostomy, hernia repair  CT CHEST, ABDOMEN AND PELVIS WITHOUT CONTRAST  Technique:  Multidetector CT imaging of the chest, abdomen and pelvis was performed following the standard protocol without IV contrast.  Comparison:  CT of the abdomen and pelvis with contrast 02/12/2012  CT CHEST  Findings:  The patient has right-sided transvenous pacemaker with leads to the right atrium and right ventricle.  Heart size is normal.  There is no mediastinal, hilar, or axillary adenopathy. Small calcified nodules are identified within the lungs, consistent with calcified granulomata.  There are no focal consolidations or pleural effusions.  No suspicious lytic or blastic lesions are identified in the thoracic spine.  IMPRESSION: No evidence for acute abnormality of the chest.  CT ABDOMEN AND PELVIS  Findings:  The the patient has a gastrojejunostomy.  The jejunal tip is in appropriate position in the proximal jejunum.  A left lower quadrant ostomy and mucus fistula are unremarkable in appearance except for scattered colonic diverticula.  The patient has a left lower quadrant port for penile implant.  No focal abnormality identified within the liver, spleen, pancreas, or adrenal glands.  Nonobstructing intrarenal calculus is identified in the left kidney, measuring 5 mm in diameter.  No evidence for ureteral stones.  The gallbladder is surgically absent.  No evidence for bowel dilatation or bowel wall thickening. The appendix is well seen and has a normal appearance.  No bowel obstruction.  Contrast reaches the colostomy bag prior to the procedure.  There continues to be significant soft tissue density surrounding the left sacroiliac joint, associated with bony erosion.  This appears to have progressed since prior study.  There is associated sclerosis in the SI joint.  Differential diagnosis includes infection or metastasis.  Urinary bladder has a normal appearance. No retroperitoneal or  mesenteric adenopathy.  The abdominal aorta is calcified but not aneurysmal.  IMPRESSION:  1.  Gastrojejunostomy tube appears been good position. 2.  Postoperative changes. 3.  Left SI joint soft tissue and bony process as described may represent infectious process or neoplasm.  This appears to have progressed since prior study. 4.  Nonobstructing left intrarenal calculus.   Original Report Authenticated By: Norva Pavlov, M.D.     Scheduled Meds:   . antiseptic oral rinse  15 mL Mouth Rinse BID  . atropine  2 drop Sublingual Q6H  . citalopram  10 mg Oral Daily  . ferrous sulfate  300 mg Per Tube BID  . gabapentin  300 mg Oral QHS  . insulin aspart  0-15 Units Subcutaneous Q4H  . insulin glargine  20 Units Subcutaneous QHS  . levothyroxine  100 mcg Oral QAC breakfast  . liothyronine  25 mcg Oral QAC breakfast  . metoprolol tartrate  25 mg Oral BID  . nystatin   Topical BID  . saccharomyces boulardii  250 mg Oral BID  . traZODone  25 mg Oral QHS  . vitamin C  500 mg Oral BID  . zinc sulfate  220 mg Oral Daily   Continuous Infusions:   . sodium chloride    . pantoprozole (PROTONIX) infusion 8 mg/hr (04/14/12 0500)    Principal Problem:  *GI bleed Active Problems:  Dysphagia due to old stroke  Hypothyroidism  DM (diabetes mellitus)  Altered mental state  Hyperkalemia    Time spent: 40 minutes   [facility]  [facility] Pager [phone]. If 8PM-8AM, please contact night-coverage at [email or web address], password [email or web address] 04/14/2012, 10:00 AM  LOS: 2 days

## 2012-04-15 LAB — HEMOGLOBIN AND HEMATOCRIT, BLOOD: Hemoglobin: 9.8 g/dL — ABNORMAL LOW (ref 13.0–17.0)

## 2012-04-15 LAB — GLUCOSE, CAPILLARY
Glucose-Capillary: 131 mg/dL — ABNORMAL HIGH (ref 70–99)
Glucose-Capillary: 171 mg/dL — ABNORMAL HIGH (ref 70–99)
Glucose-Capillary: 179 mg/dL — ABNORMAL HIGH (ref 70–99)
Glucose-Capillary: 191 mg/dL — ABNORMAL HIGH (ref 70–99)

## 2012-04-15 LAB — COMPREHENSIVE METABOLIC PANEL
ALT: 23 U/L (ref 0–53)
AST: 24 U/L (ref 0–37)
CO2: 21 mEq/L (ref 19–32)
Calcium: 9.6 mg/dL (ref 8.4–10.5)
Chloride: 106 mEq/L (ref 96–112)
GFR calc non Af Amer: 84 mL/min — ABNORMAL LOW (ref >90)
Sodium: 140 mEq/L (ref 135–145)

## 2012-04-15 MED ORDER — SALINE SPRAY 0.65 % NA SOLN
2.0000 | Freq: Three times a day (TID) | NASAL | Status: DC
  Administered 2012-04-16: 2 via NASAL
  Filled 2012-04-15: qty 44

## 2012-04-15 MED ORDER — PANTOPRAZOLE SODIUM 40 MG IV SOLR
40.0000 mg | Freq: Every day | INTRAVENOUS | Status: DC
  Administered 2012-04-15: 40 mg via INTRAVENOUS
  Filled 2012-04-15 (×2): qty 40

## 2012-04-15 MED ORDER — OXYMETAZOLINE HCL 0.05 % NA SOLN
1.0000 | Freq: Once | NASAL | Status: AC
  Administered 2012-04-15: 1 via NASAL
  Filled 2012-04-15: qty 15

## 2012-04-15 MED ORDER — MUPIROCIN 2 % EX OINT
TOPICAL_OINTMENT | Freq: Three times a day (TID) | CUTANEOUS | Status: DC
  Administered 2012-04-15 – 2012-04-17 (×3): via NASAL
  Filled 2012-04-15: qty 22

## 2012-04-15 NOTE — Evaluation (Signed)
Clinical/Bedside Swallow Evaluation Patient Details  Name: [name] MRN: [id number] Date of Birth: Nov 07, 1935  Today's Date: 04/15/2012 Time: 4098-1191 SLP Time Calculation (min): 22 min  Past Medical History:  Past Medical History  Diagnosis Date  . Stroke     Hx of with right hemiparesis  . Hypertension   . Endocarditis, mitral valve, syphilitic   . Hypothyroidism   . Anorexia   . Dysphagia   . Reflux   . Bacteremia   . Clostridium difficile colitis   . CVA (cerebral vascular accident)   . Pressure ulcer(707.0)   . Diabetes mellitus, type 2   . Anemia   . Stroke    Past Surgical History:  Past Surgical History  Procedure Date  . Colostomy   . Peg placement   . Hernia repair    HPI:  Questionable GI Bleed HEMOPTYSIS- 1 episode of vomiting, stool does not appear to be bloody, substance from G tube checked but came back hemeoccult negative x1. Rechecking the substance again and empirically treating with PPI gtt and q8h h/h in the meantime. Alternatively the substance could be his Iron syrup supplement that he takes which has been suggested to give the appearance of blood in the GI tract. willobtain CT CHEST ABDOMEN , PELVIS, CONTRAST THROUGH J TUBE , J TUBE CLOGGED?  History of dysphagia includes Sensorimotor deficits from CVA as well as obstructive dysphagia from cervical fusion per 01/23/11 MBS. At that time pts altered mentation further complicated function, d/cd from hospital NPO with J tube for feeding. Med record in chart from [facility] states "Comfort feeding, Family and pt aware of risk of aspiration, pt must be suervised with feeds. Dys 2 diet." Will need to discuss history with family/SNF staff.    Assessment / Plan / Recommendation Clinical Impression  Pt presents with signs of oropharyngeal dysphagia including pharyngeal weakness, multiple swallows and delayed indication of penetration/aspiration (throat clearing and wet vocal quality). Pt is documented to  have had a moderate to severe dysphagia though he has been consuming a dys 2/thin liqudis diet with known risk of aspiration at [facility] prior to admission. His chest CT is currently negative for active disease. SLP discussed findings and history with the pt and his son (via phone) who both agreed that pt should consume PO with known risk. MD also gave verbal agreement. Pt will need full supervision with PO for aspiration precautions. SLP will f/u for implementation of precautions and tolerance of solid textures.     Aspiration Risk  Severe    Diet Recommendation Dysphagia 2 (Fine chop);Thin liquid   Liquid Administration via: Cup Medication Administration: Whole meds with puree Supervision: Patient able to self feed;Full supervision/cueing for compensatory strategies Compensations: Slow rate;Small sips/bites;Multiple dry swallows after each bite/sip;Clear throat intermittently Postural Changes and/or Swallow Maneuvers: Seated upright 90 degrees    Other  Recommendations Oral Care Recommendations: Oral care BID   Follow Up Recommendations  Skilled Nursing facility    Frequency and Duration min 2x/week  2 weeks   Pertinent Vitals/Pain NA    SLP Swallow Goals Patient will utilize recommended strategies during swallow to increase swallowing safety with: Minimal cueing   Swallow Study Prior Functional Status       General HPI: Questionable GI Bleed HEMOPTYSIS- 1 episode of vomiting, stool does not appear to be bloody, substance from G tube checked but came back hemeoccult negative x1. Rechecking the substance again and empirically treating with PPI gtt and q8h h/h in the  meantime. Alternatively the substance could be his Iron syrup supplement that he takes which has been suggested to give the appearance of blood in the GI tract. willobtain CT CHEST ABDOMEN , PELVIS, CONTRAST THROUGH J TUBE , J TUBE CLOGGED?  History of dysphagia includes Sensorimotor deficits from CVA as well as  obstructive dysphagia from cervical fusion per 01/23/11 MBS. At that time pts altered mentation further complicated function, d/cd from hospital NPO with J tube for feeding. Med record in chart from [facility] states "Comfort feeding, Family and pt aware of risk of aspiration, pt must be suervised with feeds. Dys 2 diet." Will need to discuss history with family/SNF staff.  Type of Study: Bedside swallow evaluation Previous Swallow Assessment: MBS see, HPI Diet Prior to this Study: NPO Temperature Spikes Noted: Yes Respiratory Status: Room air History of Recent Intubation: No Behavior/Cognition: Alert;Cooperative;Confused Oral Cavity - Dentition: Adequate natural dentition Self-Feeding Abilities: Able to feed self Patient Positioning: Upright in bed Baseline Vocal Quality: Wet Volitional Cough: Strong Volitional Swallow: Able to elicit    Oral/Motor/Sensory Function Overall Oral Motor/Sensory Function: Appears within functional limits for tasks assessed   Ice Chips     Thin Liquid Thin Liquid: Impaired Presentation: Cup;Self Fed;Straw Pharyngeal  Phase Impairments: Suspected delayed Swallow;Decreased hyoid-laryngeal movement;Multiple swallows;Wet Vocal Quality;Throat Clearing - Delayed    Nectar Thick Nectar Thick Liquid: Not tested   Honey Thick Honey Thick Liquid: Not tested   Puree Puree: Not tested (pt refused)   Solid   GO Functional Assessment Tool Used: clinical judgement Functional Limitations: Swallowing Swallow Current Status (W0981): At least 60 percent but less than 80 percent impaired, limited or restricted Swallow Goal Status [phone]): At least 40 percent but less than 60 percent impaired, limited or restricted Swallow Discharge Status [phone]): At least 40 percent but less than 60 percent impaired, limited or restricted  Solid: Impaired Presentation: Self Fed Pharyngeal Phase Impairments: Decreased hyoid-laryngeal movement;Multiple swallows;Throat Clearing - Delayed       Keith Ditty, MA CCC-SLP [phone]  [name], Riley Nearing 04/15/2012,12:17 PM

## 2012-04-15 NOTE — Consult Note (Signed)
[name], [place] [id number] 1935/12/07 Keith Overlie, MD  Reason for Consult: hemoptysis  HPI: 76yo admitted from nursing facility for hemoptysis. CT neck, chest, abdomen did not reveal source of bleeding so ENT consulted for scope exam.  Allergies: No Known Allergies  ROS: weakness, hemoptysis, otherwise negative x 10 systems except per HPI  PMH:  Past Medical History  Diagnosis Date  . Stroke     Hx of with right hemiparesis  . Hypertension   . Endocarditis, mitral valve, syphilitic   . Hypothyroidism   . Anorexia   . Dysphagia   . Reflux   . Bacteremia   . Clostridium difficile colitis   . CVA (cerebral vascular accident)   . Pressure ulcer(707.0)   . Diabetes mellitus, type 2   . Anemia   . Stroke     FH:  Family History  Problem Relation Age of Onset  . Cancer Mother   . Other Father     old age after a fall  . Alzheimer's disease Sister     SH:  History   Social History  . Marital Status: Married    Spouse Name: N/A    Number of Children: N/A  . Years of Education: N/A   Occupational History  . Not on file.   Social History Main Topics  . Smoking status: Never Smoker   . Smokeless tobacco: Not on file  . Alcohol Use: No  . Drug Use: No  . Sexually Active: Not on file   Other Topics Concern  . Not on file   Social History Narrative  . No narrative on file    PSH:  Past Surgical History  Procedure Date  . Colostomy   . Peg placement   . Hernia repair     Physical  Exam: patient in bed, no acute distress EAC/TMs normal BL. Oral cavity, lips, gums, ororpharynx normal with no masses or lesions. Skin warm and dry. Nasal cavity without polyps or purulence but copious crusting making exam of middle meatus difficult. External nose and ears without masses or lesions. EOMI, PERRLA. Neck supple with no masses or lesions. No lymphadenopathy palpated.  Procedure Note: [id number]  Informed verbal consent was obtained after explaining the risks (including  bleeding and infection), benefits and alternatives of the procedure. Verbal timeout was performed prior to the procedure. The 4mm flexible scope was advanced through the nasal cavity bilaterally. The nasal cavity is full of dried, hard yellow and green crusts and dried mucous bilaterally. The septum and turbinates and middle meatus appeared otherwise normal bilaterally with no epistaxis, no dried blood or blood clots, and no sources of bleeding. The middle meatus was free of polyps or purulence. The eustachian tube, choana, and adenoids were normal in appearance. The hypopharynx, epliglottis arytenoids, false vocal folds, and true vocal folds appeared normal. There was some inspissated mucous/secretions sitting above the glottis but the true vocal folds were normal without masses and the glottis was patent with grossly mobile true vocal folds with normal adduction and abduction bilaterally. The visualized portion of the subglottis appeared normal. No mucosal masses, ulcerations, clots, or sources of bleeding were seen anywhere throughout the visualized nasal cavity, nasopharynx, pharynx, hypopharynx, or glottis.The patient tolerated the procedure with no immediate complications.  A/P: hemoptysis with normal nasal, pharyngeal, laryngeal scope exam. Will add nasal saline and mupirocin nasal care for the nasal dryness and crusting.   Melvenia Beam 04/15/2012 5:25 PM

## 2012-04-15 NOTE — Progress Notes (Signed)
Occupational Therapy Discharge Patient Details Name: [name] MRN: [id number] DOB: [birth year] Today's Date: 04/15/2012 Time:  -      PT is total (A) and requires turning every 2 hours for skin integrity    Patient discharged from OT services secondary to pt is not appropriate for acute care OT. OT called [facility] spoke to staff which indicated that patient was total (A) for all adls and required hoyer lift for OOB. Pt required turning Q2 and has limited time OOB to w/c recently. Pt at baseline has confusion and inappropriate behavior per facility. OT to sign off. .  Please see latest therapy progress note for current level of functioning and progress toward goals.    Progress and discharge plan discussed with patient and/or caregiver: Patient unable to participate in discharge planning and no caregivers available  GO     Lucile Shutters Pager: [phone]  04/15/2012, 10:42 AM

## 2012-04-15 NOTE — Progress Notes (Signed)
[facility] PROGRESS NOTE  [name] WUJ:[id number] DOB: [birth year] DOA: 04/12/2012 PCP: No primary provider on file.  Assessment/Plan: Principal Problem:  *GI bleed Active Problems:  Dysphagia due to old stroke  Hypothyroidism  DM (diabetes mellitus)  Altered mental state  Hyperkalemia    Questionable GI Bleed HEMOPTYSIS- 1 episode of vomiting, stool does not appear to be bloody, substance from G tube checked but came back hemeoccult negative x 2. Rechecking the substance again and empirically treating with PPI gtt , now switched to Protonix once a day. Alternatively the substance could be his Iron syrup supplement that he takes which has been suggested to give the appearance of blood in the GI tract. willobtain CT CHEST ABDOMEN , PELVIS, CONTRAST THROUGH J TUBE , Shows no abnormalities on chest CT, abdominal CT shows gastrojejunostomy tube to be in good position. CT scan of the head and neck benign except for orders subacute stroke. No obvious tongue lesions but typically has blood on his tongue. ENT consultation has been obtained from [facility], as suspected source of bleeding is from the head and neck     Altered mental status, Likely secondary to subacute infarct   Dysphagia due to old stroke -will get speech therapy evaluation   Hypothyroidism - check TSH, continue thyroid replacement therapy   DM - putting patient on med dose SSI Q4H while NPO here, holding home meds   AMS - according to patients son his current mental status could well be his baseline, while he does have good days and bad it is not uncommon for him to make comments as noted in the physical exam.   Hyperkalemia - no evidence of renal failure, will hold his home potassium. Received Kayexalate 2 days ago  Anemia - HGB 8.7 down from 9.9 on 10/31, monitoring with q4h h/h, no indications for transfusion at this time. Hemoglobin at baseline   Code Status: full  Family Communication: family updated  about patient's clinical progress  Disposition Plan: As above    Brief narrative:  *[name] is a 76 y.o. male brought in by his nursing home with c/o hematemesis, per his nursing home he had a single episode of hematemesis characterized by vomiting up dark blood. Patient unfortunately is unable to add much to his history taking at this time as he has significant AMS possibly due to delirium, although with that said his mental status is apparently like this some days at baseline according to his son [name] whom I spoke with on the phone (has good days and bad days  Consultants:  None Procedures:  None Antibiotics:  None HPI/Subjective:  Dried blood with oral suctioning   Objective: Filed Vitals:   04/14/12 0600 04/14/12 1517 04/14/12 2136 04/15/12 0530  BP: 141/83 136/75 133/73 154/72  Pulse: 81 88 88 75  Temp: 100.2 F (37.9 C) 99.9 F (37.7 C) 97.6 F (36.4 C) 97.9 F (36.6 C)  TempSrc: Oral Axillary Oral Oral  Resp: 18 20 16 16   Height:      Weight:    95.029 kg (209 lb 8 oz)  SpO2: 100% 100% 100% 98%    Intake/Output Summary (Last 24 hours) at 04/15/12 1018 Last data filed at 04/15/12 0600  Gross per 24 hour  Intake 1443.75 ml  Output   2380 ml  Net -936.25 ml    Exam:  HENT:  Head: Atraumatic.  Nose: Nose normal.  Mouth/Throat: Oropharynx is clear and moist.  Eyes: Conjunctivae are normal. Pupils are  equal, round, and reactive to light. No scleral icterus.  Neck: Neck supple. No tracheal deviation present.  Cardiovascular: Normal rate, regular rhythm, normal heart sounds and intact distal pulses.  Pulmonary/Chest: Effort normal and breath sounds normal. No respiratory distress.  Abdominal: Soft. Normal appearance and bowel sounds are normal. She exhibits no distension. There is no tenderness.  Musculoskeletal: She exhibits no edema and no tenderness.  Neurological: She is alert. No cranial nerve deficit.    Data Reviewed: Basic Metabolic Panel:  Lab  04/14/12 1619 04/13/12 0957 04/12/12 2219  NA 140 144143 136  K 4.1 3.83.8 5.4*  CL 105 107106 99  CO2 21 2424 27  GLUCOSE 112* 139*147* 238*  BUN 30* 40*40* 41*  CREATININE 0.89 0.880.91 0.85  CALCIUM 9.7 10.010.0 10.4  MG -- -- --  PHOS -- -- --    Liver Function Tests:  Lab 04/13/12 0957 04/12/12 2219  AST 31 44*  ALT 25 26  ALKPHOS 95 96  BILITOT 0.4 0.3  PROT 8.9* 8.9*  ALBUMIN 2.3* 2.3*   No results found for this basename: LIPASE:5,AMYLASE:5 in the last 168 hours No results found for this basename: AMMONIA:5 in the last 168 hours  CBC:  Lab 04/15/12 0530 04/14/12 2119 04/14/12 1619 04/14/12 0624 04/13/12 1307 04/12/12 2219  WBC -- -- -- 7.5 -- 8.5  NEUTROABS -- -- -- -- -- 5.2  HGB 9.8* 9.9* 9.6* 9.3* 9.3* --  HCT 31.4* 31.4* 30.4* 29.8* 30.0* --  MCV -- -- -- 78.2 -- 77.7*  PLT -- -- -- 348 -- 323    Cardiac Enzymes: No results found for this basename: CKTOTAL:5,CKMB:5,CKMBINDEX:5,TROPONINI:5 in the last 168 hours BNP (last 3 results) No results found for this basename: PROBNP:3 in the last 8760 hours   CBG:  Lab 04/15/12 0758 04/15/12 0400 04/14/12 2333 04/14/12 1930 04/14/12 1557  GLUCAP 160* 131* 126* 107* 86    Recent Results (from the past 240 hour(s))  URINE CULTURE     Status: Normal (Preliminary result)   Collection Time   04/13/12  9:52 AM      Component Value Range Status Comment   Specimen Description URINE, RANDOM   Final    Special Requests NONE   Final    Culture  Setup Time 04/13/2012 16:09   Final    Colony Count >=100,000 COLONIES/ML   Final    Culture GRAM NEGATIVE RODS   Final    Report Status PENDING   Incomplete      Studies: Ct Abdomen Pelvis Wo Contrast  04/13/2012  *RADIOLOGY REPORT*  Clinical Data:  Questionable GI bleeding.  Mass.  Contrast was given through a J tube.  The patient is vomiting blood.  Hematuria. Low hemoglobin.  History of diabetes, stroke, colostomy, hernia repair  CT CHEST, ABDOMEN AND PELVIS  WITHOUT CONTRAST  Technique:  Multidetector CT imaging of the chest, abdomen and pelvis was performed following the standard protocol without IV contrast.  Comparison:  CT of the abdomen and pelvis with contrast 02/12/2012  CT CHEST  Findings:  The patient has right-sided transvenous pacemaker with leads to the right atrium and right ventricle.  Heart size is normal.  There is no mediastinal, hilar, or axillary adenopathy. Small calcified nodules are identified within the lungs, consistent with calcified granulomata.  There are no focal consolidations or pleural effusions.  No suspicious lytic or blastic lesions are identified in the thoracic spine.  IMPRESSION: No evidence for acute abnormality of the chest.  CT ABDOMEN  AND PELVIS  Findings:  The the patient has a gastrojejunostomy.  The jejunal tip is in appropriate position in the proximal jejunum.  A left lower quadrant ostomy and mucus fistula are unremarkable in appearance except for scattered colonic diverticula.  The patient has a left lower quadrant port for penile implant.  No focal abnormality identified within the liver, spleen, pancreas, or adrenal glands.  Nonobstructing intrarenal calculus is identified in the left kidney, measuring 5 mm in diameter.  No evidence for ureteral stones.  The gallbladder is surgically absent.  No evidence for bowel dilatation or bowel wall thickening. The appendix is well seen and has a normal appearance.  No bowel obstruction.  Contrast reaches the colostomy bag prior to the procedure.  There continues to be significant soft tissue density surrounding the left sacroiliac joint, associated with bony erosion.  This appears to have progressed since prior study.  There is associated sclerosis in the SI joint.  Differential diagnosis includes infection or metastasis.  Urinary bladder has a normal appearance. No retroperitoneal or mesenteric adenopathy.  The abdominal aorta is calcified but not aneurysmal.  IMPRESSION:  1.   Gastrojejunostomy tube appears been good position. 2.  Postoperative changes. 3.  Left SI joint soft tissue and bony process as described may represent infectious process or neoplasm.  This appears to have progressed since prior study. 4.  Nonobstructing left intrarenal calculus.   Original Report Authenticated By: Norva Pavlov, M.D.    Ct Head W Wo Contrast  04/14/2012  *RADIOLOGY REPORT*  Clinical Data: Rule out mass.  Oropharyngeal bleeding.  CT HEAD WITHOUT AND WITH CONTRAST,CT NECK WITH CONTRAST  Technique:  Contiguous axial images were obtained from the base of the skull through the vertex without and with intravenous contrast.,Technique:  Multidetector CT imaging of the neck was performed with intravenous contrast.  Contrast: OMNIPAQUE IOHEXOL 300 MG/ML  SOLN  Comparison: 01/19/2011 unenhanced head CT.  Findings: New from the prior examination is left occipital hypodensity which does not enhance suggestive of infarct of indeterminate age although I suspect subacute to chronic in age.  Prominent small vessel disease type changes.  Global atrophy.  Ventricular prominence may be related to atrophy rather than hydrocephalus.  No intracranial mass or abnormal enhancement.  No intracranial hemorrhage.  IMPRESSION: Since the prior CT, interval development of left occipital lobe infarct which I suspect is subacute to chronic in age.  Prominent small vessel disease type changes.  Atrophy with ventricular prominence without significant change.  CT NECK WITH CONTRAST  Technique:  Multidetector CT imaging of the neck was performed using the standard protocol following the bolus administration of intravenous contrast.  Contrast: OMNIPAQUE IOHEXOL 300 MG/ML  SOLN  Comparison:   None  Findings: Long segment anterior corpectomy extending from the inferior aspect of C2 to the superior aspect of C7.  Anterior plate and screws in place at the superior inferior margin.  The inferior anterior screw traverses  through the anterior inferior margin of C7 into the C7-T1 disc space.  Prior complex posterior fusion from CT to T2.  The right C4, C5 and C6 pedicle screws extends into the right transverse foramen bordering the vertebral artery.  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  The exam was not optimized to evaluate the fusion.  Dual lead pacemaker enters from the right.  Minimal asymmetry posterior-superior nasopharynx without discrete mass or secondary findings of eustachian tube dysfunction.  Slight distortion of the posterior pharynx by the fusion.  Secretions within the left piriform sinus.  No discrete worrisome neck mass identified.  Distortion of the glottic region with tracheal cartilage tilted to the right.  No neck adenopathy.  Atherosclerotic type changes carotid bifurcation bilaterally. Degree of stenosis of the internal carotid artery not adequately assessed on the present exam.  IMPRESSION: No neck mass identified as cause of patient's bleeding.  Prior neck fusion as detailed above.  The present exam was not optimized to evaluate this effusion.  Plaque carotid bifurcation with narrowing. Degree of stenosis not adequately assessed on the present exam.   Original Report Authenticated By: Lacy Duverney, M.D.    Ct Soft Tissue Neck W Contrast  04/14/2012  *RADIOLOGY REPORT*  Clinical Data: Rule out mass.  Oropharyngeal bleeding.  CT HEAD WITHOUT AND WITH CONTRAST,CT NECK WITH CONTRAST  Technique:  Contiguous axial images were obtained from the base of the skull through the vertex without and with intravenous contrast.,Technique:  Multidetector CT imaging of the neck was performed with intravenous contrast.  Contrast: OMNIPAQUE IOHEXOL 300 MG/ML  SOLN  Comparison: 01/19/2011 unenhanced head CT.  Findings: New from the prior examination is left occipital hypodensity which does not enhance  suggestive of infarct of indeterminate age although I suspect subacute to chronic in age.  Prominent small vessel disease type changes.  Global atrophy.  Ventricular prominence may be related to atrophy rather than hydrocephalus.  No intracranial mass or abnormal enhancement.  No intracranial hemorrhage.  IMPRESSION: Since the prior CT, interval development of left occipital lobe infarct which I suspect is subacute to chronic in age.  Prominent small vessel disease type changes.  Atrophy with ventricular prominence without significant change.  CT NECK WITH CONTRAST  Technique:  Multidetector CT imaging of the neck was performed using the standard protocol following the bolus administration of intravenous contrast.  Contrast: OMNIPAQUE IOHEXOL 300 MG/ML  SOLN  Comparison:   None  Findings: Long segment anterior corpectomy extending from the inferior aspect of C2 to the superior aspect of C7.  Anterior plate and screws in place at the superior inferior margin.  The inferior anterior screw traverses through the anterior inferior margin of C7 into the C7-T1 disc space.  Prior complex posterior fusion from CT to T2.  The right C4, C5 and C6 pedicle screws extends into the right transverse foramen bordering the vertebral artery.  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  The exam was not optimized to evaluate the fusion.  Dual lead pacemaker enters from the right.  Minimal asymmetry posterior-superior nasopharynx without discrete mass or secondary findings of eustachian tube dysfunction.  Slight distortion of the posterior pharynx by the fusion.  Secretions within the left piriform sinus.  No discrete worrisome neck mass identified.  Distortion of the glottic region with tracheal cartilage tilted to the right.  No neck adenopathy.  Atherosclerotic type changes carotid bifurcation bilaterally. Degree of  stenosis of the internal carotid artery not adequately assessed on the present exam.  IMPRESSION: No neck mass identified as cause of patient's bleeding.  Prior neck fusion as detailed above.  The present exam was not optimized to evaluate this effusion.  Plaque carotid bifurcation with narrowing. Degree of stenosis not adequately assessed on the present exam.   Original Report Authenticated By: Lacy Duverney, M.D.    Ct Chest Wo Contrast  04/13/2012  *RADIOLOGY REPORT*  Clinical Data:  Questionable GI bleeding.  Mass.  Contrast was given through a J tube.  The patient is vomiting blood.  Hematuria. Low hemoglobin.  History of diabetes, stroke, colostomy, hernia repair  CT CHEST, ABDOMEN AND PELVIS WITHOUT CONTRAST  Technique:  Multidetector CT imaging of the chest, abdomen and pelvis was performed following the standard protocol without IV contrast.  Comparison:  CT of the abdomen and pelvis with contrast 02/12/2012  CT CHEST  Findings:  The patient has right-sided transvenous pacemaker with leads to the right atrium and right ventricle.  Heart size is normal.  There is no mediastinal, hilar, or axillary adenopathy. Small calcified nodules are identified within the lungs, consistent with calcified granulomata.  There are no focal consolidations or pleural effusions.  No suspicious lytic or blastic lesions are identified in the thoracic spine.  IMPRESSION: No evidence for acute abnormality of the chest.  CT ABDOMEN AND PELVIS  Findings:  The the patient has a gastrojejunostomy.  The jejunal tip is in appropriate position in the proximal jejunum.  A left lower quadrant ostomy and mucus fistula are unremarkable in appearance except for scattered colonic diverticula.  The patient has a left lower quadrant port for penile implant.  No focal abnormality identified within the liver, spleen, pancreas, or adrenal glands.  Nonobstructing intrarenal calculus is identified in the left kidney, measuring 5 mm in diameter.  No  evidence for ureteral stones.  The gallbladder is surgically absent.  No evidence for bowel dilatation or bowel wall thickening. The appendix is well seen and has a normal appearance.  No bowel obstruction.  Contrast reaches the colostomy bag prior to the procedure.  There continues to be significant soft tissue density surrounding the left sacroiliac joint, associated with bony erosion.  This appears to have progressed since prior study.  There is associated sclerosis in the SI joint.  Differential diagnosis includes infection or metastasis.  Urinary bladder has a normal appearance. No retroperitoneal or mesenteric adenopathy.  The abdominal aorta is calcified but not aneurysmal.  IMPRESSION:  1.  Gastrojejunostomy tube appears been good position. 2.  Postoperative changes. 3.  Left SI joint soft tissue and bony process as described may represent infectious process or neoplasm.  This appears to have progressed since prior study. 4.  Nonobstructing left intrarenal calculus.   Original Report Authenticated By: Norva Pavlov, M.D.     Scheduled Meds:   . antiseptic oral rinse  15 mL Mouth Rinse BID  . atropine  2 drop Sublingual Q6H  . citalopram  10 mg Oral Daily  . feeding supplement  30 mL Per Tube BID  . ferrous sulfate  300 mg Per Tube BID  . gabapentin  300 mg Oral QHS  . insulin aspart  0-15 Units Subcutaneous Q4H  . insulin glargine  20 Units Subcutaneous QHS  . levothyroxine  100 mcg Oral QAC breakfast  . liothyronine  25 mcg Oral QAC breakfast  . metoprolol tartrate  25 mg Oral BID  . nystatin   Topical BID  . saccharomyces boulardii  250 mg Oral BID  . traZODone  25 mg Oral QHS  . vitamin C  500 mg Oral BID  . zinc sulfate  220 mg Oral Daily   Continuous Infusions:   . sodium chloride 75 mL/hr at 04/14/12 1123  . feeding supplement (GLUCERNA 1.2 CAL) 1,000 mL (04/14/12 1400)  . pantoprozole (PROTONIX) infusion 8 mg/hr (04/14/12 0500)    Principal Problem:  *GI bleed Active  Problems:  Dysphagia due  to old stroke  Hypothyroidism  DM (diabetes mellitus)  Altered mental state  Hyperkalemia    Time spent: 40 minutes   [facility]  [facility] Pager [phone]. If 8PM-8AM, please contact night-coverage at [email or web address], password [facility] 04/15/2012, 10:18 AM  LOS: 3 days

## 2012-04-15 NOTE — Clinical Social Work Psychosocial (Signed)
     Clinical Social Work Department BRIEF PSYCHOSOCIAL ASSESSMENT 04/15/2012  Patient:  OSBORN, PULLIN     Account Number:  [id number]     Admit date:  04/12/2012  Clinical Social Worker:  Hulan Fray  Date/Time:  04/15/2012 11:06 AM  Referred by:  RN  Date Referred:  04/15/2012 Referred for  Other - See comment   Other Referral:   admit from facility   Interview type:  Family Other interview type:   Son- [facility]    PSYCHOSOCIAL DATA Living Status:  FACILITY Admitted from facility:  [facility] Level of care:  Skilled Nursing Facility Primary support name:  Rebel Willcutt Primary support relationship to patient:  CHILD, ADULT Degree of support available:   supportive    CURRENT CONCERNS Current Concerns  None Noted   Other Concerns:    SOCIAL WORK ASSESSMENT / PLAN Clinical Social Worker received referral for patient being admitted from [facility]. CSW spoke with patient's son via telephone and introduced self and explained purpose of call. Per son, the plan is to return back to [facility] at discharge. CSW spoke with Tammy, admissions coordinator at facility and they will be able to receive patient back. Per Tammy, family did not place a bed hold. CSW will send clinicals into [facility] for insurance authorization. CSW will complete FL2 for MD's signature and continue to follow.   Assessment/plan status:  Psychosocial Support/Ongoing Assessment of Needs Other assessment/ plan:   Information/referral to community resources:   Patient is from facility    PATIENTS/FAMILYS RESPONSE TO PLAN OF CARE: Son agreeable for return back to [facility] at discharge.

## 2012-04-15 NOTE — Progress Notes (Signed)
PT Cancellation Note  Patient Details Name: [name] MRN: [id number] DOB: 1936/04/12   Cancelled Treatment:    Acute PT signing off due to patient at baseline. Confirmation received from OT, who spoke with [facility], that patient is dependent for all mobility and requires hoyer lift for OOB mobility, however pt with limited OOB mobility due to sacral ulcer, requiring Q2 turns in the bed. Pt with no further skilled PT needs at this time. Please re-consult if needed in future.   Marcene Brawn 04/15/2012, 2:05 PM  Lewis Shock, PT, DPT Pager #: [phone] Office #: [phone]

## 2012-04-16 DIAGNOSIS — N390 Urinary tract infection, site not specified: Secondary | ICD-10-CM

## 2012-04-16 LAB — GLUCOSE, CAPILLARY
Glucose-Capillary: 172 mg/dL — ABNORMAL HIGH (ref 70–99)
Glucose-Capillary: 214 mg/dL — ABNORMAL HIGH (ref 70–99)
Glucose-Capillary: 178 mg/dL — ABNORMAL HIGH (ref 70–99)

## 2012-04-16 LAB — URINE CULTURE: Colony Count: >=100000

## 2012-04-16 LAB — CBC
Hemoglobin: 8.8 g/dL — ABNORMAL LOW (ref 13.0–17.0)
MCH: 23.8 pg — ABNORMAL LOW (ref 26.0–34.0)
RBC: 3.7 MIL/uL — ABNORMAL LOW (ref 4.22–5.81)
WBC: 7.6 10*3/uL (ref 4.0–10.5)

## 2012-04-16 MED ORDER — DEXTROSE 5 % IV SOLN
1.0000 g | INTRAVENOUS | Status: DC
  Administered 2012-04-16: 1 g via INTRAVENOUS
  Filled 2012-04-16 (×2): qty 10

## 2012-04-16 MED ORDER — PANTOPRAZOLE SODIUM 40 MG PO PACK
40.0000 mg | PACK | Freq: Every day | ORAL | Status: DC
  Administered 2012-04-16 – 2012-04-17 (×2): 40 mg
  Filled 2012-04-16 (×2): qty 20

## 2012-04-16 NOTE — Progress Notes (Signed)
[facility] PROGRESS NOTE  MALE [name]:[id number] DOB: 27-Apr-1935 DOA: 04/12/2012 PCP: No primary provider on file.  HPI/Subjective:  Awake, alert and oriented x3.  Assessment/Plan: Principal Problem:  *GI bleed Active Problems:  Dysphagia due to old stroke  Hypothyroidism  DM (diabetes mellitus)  Altered mental state  Hyperkalemia   Questionable GI Bleed vs HEMOPTYSIS - Hematemesis x1, with vomitus checked twice and is negative for Hemoccult. -Assumed and initially to be GI bleed and was placed on Protonix drip, now changed to daily. -No evidence of bleeding, this could be some of his medications which can get his vomitus dark -Namely the iron syrup or other dark-colored medications. -CT of abdomen and pelvis negative for abnormalities. -Patient seen by ENT, Dr. Emeline Darling scope examination did not show any evidence of bleeding.  -Continue to check hemoglobin, continue PPI.  UTI -Complicated UTI, indwelling Foley catheter related UTI with Morganella morganii. -I will obtain blood cultures, start Rocephin 1 g daily.  Altered mental status -Likely secondary to UTI   Dysphagia -Has chronic dysphagia secondary to old stroke. SLP consulted. -SLP recommended dysphagia 2 with a thin liquids with known high risk of aspiration.  Hypothyroidism - check TSH, continue thyroid replacement therapy   DM -On moderate dose SSI Q4H while NPO here, holding home meds   AMS -According to patients son his current mental status could well be his baseline -I he is clear for me today, he might make it progress.  Hyperkalemia - no evidence of renal failure, will hold his home potassium. Received Kayexalate 2 days ago  Anemia - HGB 8.7 down from 9.9 on 10/31, monitoring with q4h h/h, no indications for transfusion at this time. Hemoglobin at baseline   Code Status: full  Family Communication: family updated about patient's clinical progress  Disposition Plan: Back to skilled  nursing facility when he is medically ready.   Brief narrative:  [name] is a 77 y.o. male brought in by his nursing home with c/o hematemesis, per his nursing home he had a single episode of hematemesis characterized by vomiting up dark blood. Patient unfortunately is unable to add much to his history taking at this time as he has significant AMS possibly due to delirium, although with that said his mental status is apparently like this some days at baseline according to his son [name] whom I spoke with on the phone (has good days and bad days.  Consultants:  ENT Procedures:  None Antibiotics:  None    Objective: Filed Vitals:   04/15/12 2122 04/16/12 0545 04/16/12 1022 04/16/12 1354  BP: 158/79 144/78 138/87 123/65  Pulse: 95 69 96 90  Temp: 98 F (36.7 C) 98.3 F (36.8 C) 97.4 F (36.3 C) 98.4 F (36.9 C)  TempSrc: Oral Oral    Resp: 16 18 16 18   Height:      Weight:      SpO2: 100% 97% 100% 100%    Intake/Output Summary (Last 24 hours) at 04/16/12 1542 Last data filed at 04/16/12 1300  Gross per 24 hour  Intake   1330 ml  Output   3250 ml  Net  -1920 ml    Exam:  HENT:  Head: Atraumatic.  Nose: Nose normal.  Mouth/Throat: Oropharynx is clear and moist.  Eyes: Conjunctivae are normal. Pupils are equal, round, and reactive to light. No scleral icterus.  Neck: Neck supple. No tracheal deviation present.  Cardiovascular: Normal rate, regular rhythm, normal heart sounds and intact distal pulses.  Pulmonary/Chest: Effort normal and breath sounds normal. No respiratory distress.  Abdominal: Soft. Normal appearance and bowel sounds are normal. She exhibits no distension. There is no tenderness.  Musculoskeletal: She exhibits no edema and no tenderness.  Neurological: She is alert. No cranial nerve deficit.    Data Reviewed: Basic Metabolic Panel:  Lab 04/15/12 9604 04/14/12 1619 04/13/12 0957 04/12/12 2219  NA 140 140 144143 136  K 3.8 4.1 3.83.8 5.4*    CL 106 105 107106 99  CO2 21 21 2424 27  GLUCOSE 198* 112* 139*147* 238*  BUN 27* 30* 40*40* 41*  CREATININE 0.84 0.89 0.880.91 0.85  CALCIUM 9.6 9.7 10.010.0 10.4  MG -- -- -- --  PHOS -- -- -- --    Liver Function Tests:  Lab 04/15/12 1148 04/13/12 0957 04/12/12 2219  AST 24 31 44*  ALT 23 25 26   ALKPHOS 108 95 96  BILITOT 0.3 0.4 0.3  PROT 8.6* 8.9* 8.9*  ALBUMIN 2.2* 2.3* 2.3*   No results found for this basename: LIPASE:5,AMYLASE:5 in the last 168 hours No results found for this basename: AMMONIA:5 in the last 168 hours  CBC:  Lab 04/16/12 0500 04/15/12 0530 04/14/12 2119 04/14/12 1619 04/14/12 0624 04/12/12 2219  WBC 7.6 -- -- -- 7.5 8.5  NEUTROABS -- -- -- -- -- 5.2  HGB 8.8* 9.8* 9.9* 9.6* 9.3* --  HCT 28.1* 31.4* 31.4* 30.4* 29.8* --  MCV 75.9* -- -- -- 78.2 77.7*  PLT 341 -- -- -- 348 323    Cardiac Enzymes: No results found for this basename: CKTOTAL:5,CKMB:5,CKMBINDEX:5,TROPONINI:5 in the last 168 hours BNP (last 3 results) No results found for this basename: PROBNP:3 in the last 8760 hours   CBG:  Lab 04/16/12 1217 04/16/12 0747 04/16/12 0402 04/15/12 2347 04/15/12 2005  GLUCAP 214* 155* 172* 171* 179*    Recent Results (from the past 240 hour(s))  URINE CULTURE     Status: Normal   Collection Time   04/13/12  9:52 AM      Component Value Range Status Comment   Specimen Description URINE, RANDOM   Final    Special Requests NONE   Final    Culture  Setup Time 04/13/2012 16:09   Final    Colony Count >=100,000 COLONIES/ML   Final    Culture [facility] MORGANII   Final    Report Status 04/16/2012 FINAL   Final    Organism ID, Bacteria MORGANELLA MORGANII   Final      Studies: Ct Abdomen Pelvis Wo Contrast  04/13/2012  *RADIOLOGY REPORT*  Clinical Data:  Questionable GI bleeding.  Mass.  Contrast was given through a J tube.  The patient is vomiting blood.  Hematuria. Low hemoglobin.  History of diabetes, stroke, colostomy, hernia repair   CT CHEST, ABDOMEN AND PELVIS WITHOUT CONTRAST  Technique:  Multidetector CT imaging of the chest, abdomen and pelvis was performed following the standard protocol without IV contrast.  Comparison:  CT of the abdomen and pelvis with contrast 02/12/2012  CT CHEST  Findings:  The patient has right-sided transvenous pacemaker with leads to the right atrium and right ventricle.  Heart size is normal.  There is no mediastinal, hilar, or axillary adenopathy. Small calcified nodules are identified within the lungs, consistent with calcified granulomata.  There are no focal consolidations or pleural effusions.  No suspicious lytic or blastic lesions are identified in the thoracic spine.  IMPRESSION: No evidence for acute abnormality of the chest.  CT ABDOMEN AND  PELVIS  Findings:  The the patient has a gastrojejunostomy.  The jejunal tip is in appropriate position in the proximal jejunum.  A left lower quadrant ostomy and mucus fistula are unremarkable in appearance except for scattered colonic diverticula.  The patient has a left lower quadrant port for penile implant.  No focal abnormality identified within the liver, spleen, pancreas, or adrenal glands.  Nonobstructing intrarenal calculus is identified in the left kidney, measuring 5 mm in diameter.  No evidence for ureteral stones.  The gallbladder is surgically absent.  No evidence for bowel dilatation or bowel wall thickening. The appendix is well seen and has a normal appearance.  No bowel obstruction.  Contrast reaches the colostomy bag prior to the procedure.  There continues to be significant soft tissue density surrounding the left sacroiliac joint, associated with bony erosion.  This appears to have progressed since prior study.  There is associated sclerosis in the SI joint.  Differential diagnosis includes infection or metastasis.  Urinary bladder has a normal appearance. No retroperitoneal or mesenteric adenopathy.  The abdominal aorta is calcified but not  aneurysmal.  IMPRESSION:  1.  Gastrojejunostomy tube appears been good position. 2.  Postoperative changes. 3.  Left SI joint soft tissue and bony process as described may represent infectious process or neoplasm.  This appears to have progressed since prior study. 4.  Nonobstructing left intrarenal calculus.   Original Report Authenticated By: Norva Pavlov, M.D.    Ct Head W Wo Contrast  04/14/2012  *RADIOLOGY REPORT*  Clinical Data: Rule out mass.  Oropharyngeal bleeding.  CT HEAD WITHOUT AND WITH CONTRAST,CT NECK WITH CONTRAST  Technique:  Contiguous axial images were obtained from the base of the skull through the vertex without and with intravenous contrast.,Technique:  Multidetector CT imaging of the neck was performed with intravenous contrast.  Contrast: OMNIPAQUE IOHEXOL 300 MG/ML  SOLN  Comparison: 01/19/2011 unenhanced head CT.  Findings: New from the prior examination is left occipital hypodensity which does not enhance suggestive of infarct of indeterminate age although I suspect subacute to chronic in age.  Prominent small vessel disease type changes.  Global atrophy.  Ventricular prominence may be related to atrophy rather than hydrocephalus.  No intracranial mass or abnormal enhancement.  No intracranial hemorrhage.  IMPRESSION: Since the prior CT, interval development of left occipital lobe infarct which I suspect is subacute to chronic in age.  Prominent small vessel disease type changes.  Atrophy with ventricular prominence without significant change.  CT NECK WITH CONTRAST  Technique:  Multidetector CT imaging of the neck was performed using the standard protocol following the bolus administration of intravenous contrast.  Contrast: OMNIPAQUE IOHEXOL 300 MG/ML  SOLN  Comparison:   None  Findings: Long segment anterior corpectomy extending from the inferior aspect of C2 to the superior aspect of C7.  Anterior plate and screws in place at the superior inferior margin.  The  inferior anterior screw traverses through the anterior inferior margin of C7 into the C7-T1 disc space.  Prior complex posterior fusion from CT to T2.  The right C4, C5 and C6 pedicle screws extends into the right transverse foramen bordering the vertebral artery.  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  The exam was not optimized to evaluate the fusion.  Dual lead pacemaker enters from the right.  Minimal asymmetry posterior-superior nasopharynx without discrete mass or secondary findings of eustachian tube dysfunction.  Slight  distortion of the posterior pharynx by the fusion.  Secretions within the left piriform sinus.  No discrete worrisome neck mass identified.  Distortion of the glottic region with tracheal cartilage tilted to the right.  No neck adenopathy.  Atherosclerotic type changes carotid bifurcation bilaterally. Degree of stenosis of the internal carotid artery not adequately assessed on the present exam.  IMPRESSION: No neck mass identified as cause of patient's bleeding.  Prior neck fusion as detailed above.  The present exam was not optimized to evaluate this effusion.  Plaque carotid bifurcation with narrowing. Degree of stenosis not adequately assessed on the present exam.   Original Report Authenticated By: Lacy Duverney, M.D.    Ct Soft Tissue Neck W Contrast  04/14/2012  *RADIOLOGY REPORT*  Clinical Data: Rule out mass.  Oropharyngeal bleeding.  CT HEAD WITHOUT AND WITH CONTRAST,CT NECK WITH CONTRAST  Technique:  Contiguous axial images were obtained from the base of the skull through the vertex without and with intravenous contrast.,Technique:  Multidetector CT imaging of the neck was performed with intravenous contrast.  Contrast: OMNIPAQUE IOHEXOL 300 MG/ML  SOLN  Comparison: 01/19/2011 unenhanced head CT.  Findings: New from the prior examination is left occipital  hypodensity which does not enhance suggestive of infarct of indeterminate age although I suspect subacute to chronic in age.  Prominent small vessel disease type changes.  Global atrophy.  Ventricular prominence may be related to atrophy rather than hydrocephalus.  No intracranial mass or abnormal enhancement.  No intracranial hemorrhage.  IMPRESSION: Since the prior CT, interval development of left occipital lobe infarct which I suspect is subacute to chronic in age.  Prominent small vessel disease type changes.  Atrophy with ventricular prominence without significant change.  CT NECK WITH CONTRAST  Technique:  Multidetector CT imaging of the neck was performed using the standard protocol following the bolus administration of intravenous contrast.  Contrast: OMNIPAQUE IOHEXOL 300 MG/ML  SOLN  Comparison:   None  Findings: Long segment anterior corpectomy extending from the inferior aspect of C2 to the superior aspect of C7.  Anterior plate and screws in place at the superior inferior margin.  The inferior anterior screw traverses through the anterior inferior margin of C7 into the C7-T1 disc space.  Prior complex posterior fusion from CT to T2.  The right C4, C5 and C6 pedicle screws extends into the right transverse foramen bordering the vertebral artery.  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  The exam was not optimized to evaluate the fusion.  Dual lead pacemaker enters from the right.  Minimal asymmetry posterior-superior nasopharynx without discrete mass or secondary findings of eustachian tube dysfunction.  Slight distortion of the posterior pharynx by the fusion.  Secretions within the left piriform sinus.  No discrete worrisome neck mass identified.  Distortion of the glottic region with tracheal cartilage tilted to the right.  No neck adenopathy.  Atherosclerotic type changes carotid  bifurcation bilaterally. Degree of stenosis of the internal carotid artery not adequately assessed on the present exam.  IMPRESSION: No neck mass identified as cause of patient's bleeding.  Prior neck fusion as detailed above.  The present exam was not optimized to evaluate this effusion.  Plaque carotid bifurcation with narrowing. Degree of stenosis not adequately assessed on the present exam.   Original Report Authenticated By: Lacy Duverney, M.D.    Ct Chest Wo Contrast  04/13/2012  *RADIOLOGY REPORT*  Clinical Data:  Questionable GI bleeding.  Mass.  Contrast was given through a J tube.  The patient is vomiting blood.  Hematuria. Low hemoglobin.  History of diabetes, stroke, colostomy, hernia repair  CT CHEST, ABDOMEN AND PELVIS WITHOUT CONTRAST  Technique:  Multidetector CT imaging of the chest, abdomen and pelvis was performed following the standard protocol without IV contrast.  Comparison:  CT of the abdomen and pelvis with contrast 02/12/2012  CT CHEST  Findings:  The patient has right-sided transvenous pacemaker with leads to the right atrium and right ventricle.  Heart size is normal.  There is no mediastinal, hilar, or axillary adenopathy. Small calcified nodules are identified within the lungs, consistent with calcified granulomata.  There are no focal consolidations or pleural effusions.  No suspicious lytic or blastic lesions are identified in the thoracic spine.  IMPRESSION: No evidence for acute abnormality of the chest.  CT ABDOMEN AND PELVIS  Findings:  The the patient has a gastrojejunostomy.  The jejunal tip is in appropriate position in the proximal jejunum.  A left lower quadrant ostomy and mucus fistula are unremarkable in appearance except for scattered colonic diverticula.  The patient has a left lower quadrant port for penile implant.  No focal abnormality identified within the liver, spleen, pancreas, or adrenal glands.  Nonobstructing intrarenal calculus is identified in the left  kidney, measuring 5 mm in diameter.  No evidence for ureteral stones.  The gallbladder is surgically absent.  No evidence for bowel dilatation or bowel wall thickening. The appendix is well seen and has a normal appearance.  No bowel obstruction.  Contrast reaches the colostomy bag prior to the procedure.  There continues to be significant soft tissue density surrounding the left sacroiliac joint, associated with bony erosion.  This appears to have progressed since prior study.  There is associated sclerosis in the SI joint.  Differential diagnosis includes infection or metastasis.  Urinary bladder has a normal appearance. No retroperitoneal or mesenteric adenopathy.  The abdominal aorta is calcified but not aneurysmal.  IMPRESSION:  1.  Gastrojejunostomy tube appears been good position. 2.  Postoperative changes. 3.  Left SI joint soft tissue and bony process as described may represent infectious process or neoplasm.  This appears to have progressed since prior study. 4.  Nonobstructing left intrarenal calculus.   Original Report Authenticated By: Norva Pavlov, M.D.     Scheduled Meds:    . antiseptic oral rinse  15 mL Mouth Rinse BID  . atropine  2 drop Sublingual Q6H  . citalopram  10 mg Oral Daily  . feeding supplement  30 mL Per Tube BID  . ferrous sulfate  300 mg Per Tube BID  . gabapentin  300 mg Oral QHS  . insulin aspart  0-15 Units Subcutaneous Q4H  . insulin glargine  20 Units Subcutaneous QHS  . levothyroxine  100 mcg Oral QAC breakfast  . liothyronine  25 mcg Oral QAC breakfast  . metoprolol tartrate  25 mg Oral BID  . mupirocin ointment   Nasal TID  . nystatin   Topical BID  . pantoprazole sodium  40 mg Per Tube Daily  . saccharomyces boulardii  250 mg Oral BID  . sodium chloride  2 spray Each Nare TID  . traZODone  25 mg Oral QHS  . vitamin C  500 mg Oral BID  . zinc sulfate  220 mg Oral Daily   Continuous Infusions:    . sodium chloride 75 mL/hr at 04/14/12 1123  .  feeding  supplement (GLUCERNA 1.2 CAL) 1,000 mL (04/15/12 1843)    Principal Problem:  *GI bleed Active Problems:  Dysphagia due to old stroke  Hypothyroidism  DM (diabetes mellitus)  Altered mental state  Hyperkalemia    Time spent: 40 minutes   [facility] A  [facility] Pager [phone]. If 8PM-8AM, please contact night-coverage at [email or web address], password [facility] 04/16/2012, 3:42 PM  LOS: 4 days

## 2012-04-17 LAB — GLUCOSE, CAPILLARY
Glucose-Capillary: 215 mg/dL — ABNORMAL HIGH (ref 70–99)
Glucose-Capillary: 206 mg/dL — ABNORMAL HIGH (ref 70–99)

## 2012-04-17 LAB — CBC
HCT: 27.2 % — ABNORMAL LOW (ref 39.0–52.0)
MCH: 23.8 pg — ABNORMAL LOW (ref 26.0–34.0)
MCHC: 31.3 g/dL (ref 30.0–36.0)
MCV: 76.2 fL — ABNORMAL LOW (ref 78.0–100.0)
RDW: 17.7 % — ABNORMAL HIGH (ref 11.5–15.5)

## 2012-04-17 LAB — BASIC METABOLIC PANEL
BUN: 22 mg/dL (ref 6–23)
Chloride: 103 mEq/L (ref 96–112)
Creatinine, Ser: 0.73 mg/dL (ref 0.50–1.35)
GFR calc Af Amer: >90 mL/min (ref >90)
Glucose, Bld: 212 mg/dL — ABNORMAL HIGH (ref 70–99)

## 2012-04-17 MED ORDER — PANTOPRAZOLE SODIUM 40 MG PO PACK: 40.0000 mg | PACK | Freq: Every day | ORAL | Status: DC

## 2012-04-17 MED ORDER — DEXTROSE 5 % IV SOLN: 1.0000 g | INTRAVENOUS | Status: AC

## 2012-04-17 MED ORDER — GLUCERNA 1.2 CAL PO LIQD: 1000.0000 mL | ORAL | Status: DC

## 2012-04-17 MED ORDER — INSULIN ASPART 100 UNIT/ML ~~LOC~~ SOLN: 3.0000 [IU] | Freq: Three times a day (TID) | SUBCUTANEOUS | Status: DC

## 2012-04-17 NOTE — Progress Notes (Signed)
Inpatient Diabetes Program Recommendations  AACE/ADA: New Consensus Statement on Inpatient Glycemic Control (2013)  Target Ranges:  Prepandial:   less than 140 mg/dL      Peak postprandial:   less than 180 mg/dL (1-2 hours)      Critically ill patients:  140 - 180 mg/dL   Reason for Visit: Results for ARA, MANO (MRN [id number]) as of 04/17/2012 13:23  Ref. Range 04/17/2012 04:12 04/17/2012 05:45 04/17/2012 07:50 04/17/2012 12:33  Glucose-Capillary Latest Range: 70-99 mg/dL 865 (H)  784 (H) 696 (H)   Consider changing correction Novolog to tid with meals and HS.  Consider adding Novolog meal coverage 3 units tid with meals (hold if patient eats less than 50%).       Note:Will follow.

## 2012-04-17 NOTE — Progress Notes (Signed)
Patient's IV saline locked to go transport to [facility]. G tube clamped. Condom cath removed. Patient's family aware of transport. Report called to [facility].

## 2012-04-17 NOTE — Discharge Summary (Addendum)
Physician Discharge Summary  [name] MRN: [id number] DOB/AGE: 12-09-1935 77 y.o.  PCP: No primary provider on file.   Admit date: 04/12/2012 Discharge date: 04/17/2012  Discharge Diagnoses:     *GI bleed Active Problems:  Dysphagia due to old stroke  Hypothyroidism  DM (diabetes mellitus)  Altered mental state  Hyperkalemia     Medication List     As of 04/17/2012  1:41 PM    STOP taking these medications         aspirin 325 MG tablet      TAKE these medications         acetaminophen 160 MG/5ML elixir   Commonly known as: TYLENOL   Give 640 mg by tube every 6 (six) hours as needed. For pain      acetaminophen 650 MG suppository   Commonly known as: TYLENOL   Place 650 mg rectally every 4 (four) hours as needed. For fever      atropine 1 % ophthalmic solution   Take 2 drops by mouth every 6 (six) hours.      citalopram 10 MG tablet   Commonly known as: CELEXA   Take 10 mg by mouth daily.      dextrose 5 % SOLN 50 mL with cefTRIAXone 1 G SOLR 1 g   Inject 1 g into the vein daily.      feeding supplement (JEVITY 1.2 CAL) Liqd   Place 1,000 mLs into feeding tube continuous.      feeding supplement (GLUCERNA 1.2 CAL) Liqd   Place 1,000 mLs into feeding tube continuous.      ferrous sulfate 300 (60 FE) MG/5ML syrup   Give 300 mg by tube 2 (two) times daily.      gabapentin 300 MG capsule   Commonly known as: NEURONTIN   Give 300 mg by tube at bedtime.      glyBURIDE 2.5 MG tablet   Commonly known as: DIABETA   Take 2.5 mg by mouth 2 (two) times daily with a meal.      insulin aspart 100 UNIT/ML injection   Commonly known as: novoLOG   Inject 3 Units into the skin 3 (three) times daily before meals. For CBG over 200      insulin glargine 100 UNIT/ML injection   Commonly known as: LANTUS   Inject 20 Units into the skin at bedtime.      levothyroxine 100 MCG tablet   Commonly known as: SYNTHROID, LEVOTHROID   Take 100 mcg by mouth daily.        liothyronine 25 MCG tablet   Commonly known as: CYTOMEL   Take 25 mcg by mouth daily.      metoprolol tartrate 25 MG tablet   Commonly known as: LOPRESSOR   Give 25 mg by tube 2 (two) times daily.      multivitamins ther. w/minerals Tabs   Give 1 tablet by tube daily.      pantoprazole sodium 40 mg/20 mL Pack   Commonly known as: PROTONIX   Place 20 mLs (40 mg total) into feeding tube daily.      potassium chloride 20 MEQ/15ML (10%) solution   Give 10 mEq by tube daily.      promethazine 25 MG tablet   Commonly known as: PHENERGAN   Give 25 mg by tube every 6 (six) hours as needed. For nausea      pseudoephedrine 30 MG tablet   Commonly known as: SUDAFED   Take  30 mg by mouth every 6 (six) hours as needed. For secretions      ranitidine 75 MG tablet   Commonly known as: ZANTAC   Give 150 mg by tube 2 (two) times daily.      saccharomyces boulardii 250 MG capsule   Commonly known as: FLORASTOR   Give 250 mg by tube 2 (two) times daily.      thiamine 100 MG tablet   Commonly known as: VITAMIN B-1   Give 100 mg by tube every morning.      traMADol 50 MG tablet   Commonly known as: ULTRAM   Give 50 mg by tube every 6 (six) hours as needed.      traZODone 25 mg Tabs   Commonly known as: DESYREL   Take 25 mg by mouth at bedtime.      vitamin C 500 MG tablet   Commonly known as: ASCORBIC ACID   Give 500 mg by tube 2 (two) times daily.      zinc sulfate 220 MG capsule   Take 1 capsule (220 mg total) by mouth daily.        Discharge Condition: Stable Disposition: 03-Skilled Nursing Facility   Consults:  ENT  Significant Diagnostic Studies: Ct Abdomen Pelvis Wo Contrast  04/13/2012  *RADIOLOGY REPORT*  Clinical Data:  Questionable GI bleeding.  Mass.  Contrast was given through a J tube.  The patient is vomiting blood.  Hematuria. Low hemoglobin.  History of diabetes, stroke, colostomy, hernia repair  CT CHEST, ABDOMEN AND PELVIS WITHOUT CONTRAST   Technique:  Multidetector CT imaging of the chest, abdomen and pelvis was performed following the standard protocol without IV contrast.  Comparison:  CT of the abdomen and pelvis with contrast 02/12/2012  CT CHEST  Findings:  The patient has right-sided transvenous pacemaker with leads to the right atrium and right ventricle.  Heart size is normal.  There is no mediastinal, hilar, or axillary adenopathy. Small calcified nodules are identified within the lungs, consistent with calcified granulomata.  There are no focal consolidations or pleural effusions.  No suspicious lytic or blastic lesions are identified in the thoracic spine.  IMPRESSION: No evidence for acute abnormality of the chest.  CT ABDOMEN AND PELVIS  Findings:  The the patient has a gastrojejunostomy.  The jejunal tip is in appropriate position in the proximal jejunum.  A left lower quadrant ostomy and mucus fistula are unremarkable in appearance except for scattered colonic diverticula.  The patient has a left lower quadrant port for penile implant.  No focal abnormality identified within the liver, spleen, pancreas, or adrenal glands.  Nonobstructing intrarenal calculus is identified in the left kidney, measuring 5 mm in diameter.  No evidence for ureteral stones.  The gallbladder is surgically absent.  No evidence for bowel dilatation or bowel wall thickening. The appendix is well seen and has a normal appearance.  No bowel obstruction.  Contrast reaches the colostomy bag prior to the procedure.  There continues to be significant soft tissue density surrounding the left sacroiliac joint, associated with bony erosion.  This appears to have progressed since prior study.  There is associated sclerosis in the SI joint.  Differential diagnosis includes infection or metastasis.  Urinary bladder has a normal appearance. No retroperitoneal or mesenteric adenopathy.  The abdominal aorta is calcified but not aneurysmal.  IMPRESSION:  1.  Gastrojejunostomy  tube appears been good position. 2.  Postoperative changes. 3.  Left SI joint soft tissue and bony process  as described may represent infectious process or neoplasm.  This appears to have progressed since prior study. 4.  Nonobstructing left intrarenal calculus.   Original Report Authenticated By: Norva Pavlov, M.D.    Ct Head W Wo Contrast  04/14/2012  *RADIOLOGY REPORT*  Clinical Data: Rule out mass.  Oropharyngeal bleeding.  CT HEAD WITHOUT AND WITH CONTRAST,CT NECK WITH CONTRAST  Technique:  Contiguous axial images were obtained from the base of the skull through the vertex without and with intravenous contrast.,Technique:  Multidetector CT imaging of the neck was performed with intravenous contrast.  Contrast: OMNIPAQUE IOHEXOL 300 MG/ML  SOLN  Comparison: 01/19/2011 unenhanced head CT.  Findings: New from the prior examination is left occipital hypodensity which does not enhance suggestive of infarct of indeterminate age although I suspect subacute to chronic in age.  Prominent small vessel disease type changes.  Global atrophy.  Ventricular prominence may be related to atrophy rather than hydrocephalus.  No intracranial mass or abnormal enhancement.  No intracranial hemorrhage.  IMPRESSION: Since the prior CT, interval development of left occipital lobe infarct which I suspect is subacute to chronic in age.  Prominent small vessel disease type changes.  Atrophy with ventricular prominence without significant change.  CT NECK WITH CONTRAST  Technique:  Multidetector CT imaging of the neck was performed using the standard protocol following the bolus administration of intravenous contrast.  Contrast: OMNIPAQUE IOHEXOL 300 MG/ML  SOLN  Comparison:   None  Findings: Long segment anterior corpectomy extending from the inferior aspect of C2 to the superior aspect of C7.  Anterior plate and screws in place at the superior inferior margin.  The inferior anterior screw traverses through the anterior  inferior margin of C7 into the C7-T1 disc space.  Prior complex posterior fusion from CT to T2.  The right C4, C5 and C6 pedicle screws extends into the right transverse foramen bordering the vertebral artery.  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  The exam was not optimized to evaluate the fusion.  Dual lead pacemaker enters from the right.  Minimal asymmetry posterior-superior nasopharynx without discrete mass or secondary findings of eustachian tube dysfunction.  Slight distortion of the posterior pharynx by the fusion.  Secretions within the left piriform sinus.  No discrete worrisome neck mass identified.  Distortion of the glottic region with tracheal cartilage tilted to the right.  No neck adenopathy.  Atherosclerotic type changes carotid bifurcation bilaterally. Degree of stenosis of the internal carotid artery not adequately assessed on the present exam.  IMPRESSION: No neck mass identified as cause of patient's bleeding.  Prior neck fusion as detailed above.  The present exam was not optimized to evaluate this effusion.  Plaque carotid bifurcation with narrowing. Degree of stenosis not adequately assessed on the present exam.   Original Report Authenticated By: Lacy Duverney, M.D.    Ct Soft Tissue Neck W Contrast  04/14/2012  *RADIOLOGY REPORT*  Clinical Data: Rule out mass.  Oropharyngeal bleeding.  CT HEAD WITHOUT AND WITH CONTRAST,CT NECK WITH CONTRAST  Technique:  Contiguous axial images were obtained from the base of the skull through the vertex without and with intravenous contrast.,Technique:  Multidetector CT imaging of the neck was performed with intravenous contrast.  Contrast: OMNIPAQUE IOHEXOL 300 MG/ML  SOLN  Comparison: 01/19/2011 unenhanced head CT.  Findings: New from the prior examination is left occipital hypodensity which does not enhance suggestive of infarct of  indeterminate age although I suspect subacute to chronic in age.  Prominent small vessel disease type changes.  Global atrophy.  Ventricular prominence may be related to atrophy rather than hydrocephalus.  No intracranial mass or abnormal enhancement.  No intracranial hemorrhage.  IMPRESSION: Since the prior CT, interval development of left occipital lobe infarct which I suspect is subacute to chronic in age.  Prominent small vessel disease type changes.  Atrophy with ventricular prominence without significant change.  CT NECK WITH CONTRAST  Technique:  Multidetector CT imaging of the neck was performed using the standard protocol following the bolus administration of intravenous contrast.  Contrast: OMNIPAQUE IOHEXOL 300 MG/ML  SOLN  Comparison:   None  Findings: Long segment anterior corpectomy extending from the inferior aspect of C2 to the superior aspect of C7.  Anterior plate and screws in place at the superior inferior margin.  The inferior anterior screw traverses through the anterior inferior margin of C7 into the C7-T1 disc space.  Prior complex posterior fusion from CT to T2.  The right C4, C5 and C6 pedicle screws extends into the right transverse foramen bordering the vertebral artery.  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  The exam was not optimized to evaluate the fusion.  Dual lead pacemaker enters from the right.  Minimal asymmetry posterior-superior nasopharynx without discrete mass or secondary findings of eustachian tube dysfunction.  Slight distortion of the posterior pharynx by the fusion.  Secretions within the left piriform sinus.  No discrete worrisome neck mass identified.  Distortion of the glottic region with tracheal cartilage tilted to the right.  No neck adenopathy.  Atherosclerotic type changes carotid bifurcation bilaterally. Degree of stenosis of the internal carotid  artery not adequately assessed on the present exam.  IMPRESSION: No neck mass identified as cause of patient's bleeding.  Prior neck fusion as detailed above.  The present exam was not optimized to evaluate this effusion.  Plaque carotid bifurcation with narrowing. Degree of stenosis not adequately assessed on the present exam.   Original Report Authenticated By: Lacy Duverney, M.D.    Ct Chest Wo Contrast  04/13/2012  *RADIOLOGY REPORT*  Clinical Data:  Questionable GI bleeding.  Mass.  Contrast was given through a J tube.  The patient is vomiting blood.  Hematuria. Low hemoglobin.  History of diabetes, stroke, colostomy, hernia repair  CT CHEST, ABDOMEN AND PELVIS WITHOUT CONTRAST  Technique:  Multidetector CT imaging of the chest, abdomen and pelvis was performed following the standard protocol without IV contrast.  Comparison:  CT of the abdomen and pelvis with contrast 02/12/2012  CT CHEST  Findings:  The patient has right-sided transvenous pacemaker with leads to the right atrium and right ventricle.  Heart size is normal.  There is no mediastinal, hilar, or axillary adenopathy. Small calcified nodules are identified within the lungs, consistent with calcified granulomata.  There are no focal consolidations or pleural effusions.  No suspicious lytic or blastic lesions are identified in the thoracic spine.  IMPRESSION: No evidence for acute abnormality of the chest.  CT ABDOMEN AND PELVIS  Findings:  The the patient has a gastrojejunostomy.  The jejunal tip is in appropriate position in the proximal jejunum.  A left lower quadrant ostomy and mucus fistula are unremarkable in appearance except for scattered colonic diverticula.  The patient has a left lower quadrant port for penile implant.  No focal abnormality identified within the liver, spleen, pancreas,  or adrenal glands.  Nonobstructing intrarenal calculus is identified in the left kidney, measuring 5 mm in diameter.  No evidence for ureteral stones.   The gallbladder is surgically absent.  No evidence for bowel dilatation or bowel wall thickening. The appendix is well seen and has a normal appearance.  No bowel obstruction.  Contrast reaches the colostomy bag prior to the procedure.  There continues to be significant soft tissue density surrounding the left sacroiliac joint, associated with bony erosion.  This appears to have progressed since prior study.  There is associated sclerosis in the SI joint.  Differential diagnosis includes infection or metastasis.  Urinary bladder has a normal appearance. No retroperitoneal or mesenteric adenopathy.  The abdominal aorta is calcified but not aneurysmal.  IMPRESSION:  1.  Gastrojejunostomy tube appears been good position. 2.  Postoperative changes. 3.  Left SI joint soft tissue and bony process as described may represent infectious process or neoplasm.  This appears to have progressed since prior study. 4.  Nonobstructing left intrarenal calculus.   Original Report Authenticated By: Norva Pavlov, M.D.        Microbiology: Recent Results (from the past 240 hour(s))  URINE CULTURE     Status: Normal   Collection Time   04/13/12  9:52 AM      Component Value Range Status Comment   Specimen Description URINE, RANDOM   Final    Special Requests NONE   Final    Culture  Setup Time 04/13/2012 16:09   Final    Colony Count >=100,000 COLONIES/ML   Final    Culture [facility] MORGANII   Final    Report Status 04/16/2012 FINAL   Final    Organism ID, Bacteria MORGANELLA MORGANII   Final      Labs: Results for orders placed during the hospital encounter of 04/12/12 (from the past 48 hour(s))  GLUCOSE, CAPILLARY     Status: Abnormal   Collection Time   04/15/12  4:05 PM      Component Value Range Comment   Glucose-Capillary 191 (*) 70 - 99 mg/dL    Comment 1 Notify RN     GLUCOSE, CAPILLARY     Status: Abnormal   Collection Time   04/15/12  8:05 PM      Component Value Range Comment    Glucose-Capillary 179 (*) 70 - 99 mg/dL    Comment 1 Notify RN     GLUCOSE, CAPILLARY     Status: Abnormal   Collection Time   04/15/12 11:47 PM      Component Value Range Comment   Glucose-Capillary 171 (*) 70 - 99 mg/dL    Comment 1 Notify RN     GLUCOSE, CAPILLARY     Status: Abnormal   Collection Time   04/16/12  4:02 AM      Component Value Range Comment   Glucose-Capillary 172 (*) 70 - 99 mg/dL   CBC     Status: Abnormal   Collection Time   04/16/12  5:00 AM      Component Value Range Comment   WBC 7.6  4.0 - 10.5 K/uL    RBC 3.70 (*) 4.22 - 5.81 MIL/uL    Hemoglobin 8.8 (*) 13.0 - 17.0 g/dL    HCT 45.4 (*) 09.8 - 52.0 %    MCV 75.9 (*) 78.0 - 100.0 fL    MCH 23.8 (*) 26.0 - 34.0 pg    MCHC 31.3  30.0 - 36.0 g/dL    RDW  17.8 (*) 11.5 - 15.5 %    Platelets 341  150 - 400 K/uL   GLUCOSE, CAPILLARY     Status: Abnormal   Collection Time   04/16/12  7:47 AM      Component Value Range Comment   Glucose-Capillary 155 (*) 70 - 99 mg/dL    Comment 1 Notify RN     GLUCOSE, CAPILLARY     Status: Abnormal   Collection Time   04/16/12 12:17 PM      Component Value Range Comment   Glucose-Capillary 214 (*) 70 - 99 mg/dL    Comment 1 Notify RN     GLUCOSE, CAPILLARY     Status: Abnormal   Collection Time   04/16/12  4:03 PM      Component Value Range Comment   Glucose-Capillary 178 (*) 70 - 99 mg/dL    Comment 1 Notify RN     GLUCOSE, CAPILLARY     Status: Abnormal   Collection Time   04/16/12  8:15 PM      Component Value Range Comment   Glucose-Capillary 186 (*) 70 - 99 mg/dL   GLUCOSE, CAPILLARY     Status: Abnormal   Collection Time   04/16/12 11:27 PM      Component Value Range Comment   Glucose-Capillary 215 (*) 70 - 99 mg/dL   GLUCOSE, CAPILLARY     Status: Abnormal   Collection Time   04/17/12  4:12 AM      Component Value Range Comment   Glucose-Capillary 192 (*) 70 - 99 mg/dL   BASIC METABOLIC PANEL     Status: Abnormal   Collection Time   04/17/12  5:45 AM       Component Value Range Comment   Sodium 136  135 - 145 mEq/L    Potassium 3.7  3.5 - 5.1 mEq/L    Chloride 103  96 - 112 mEq/L    CO2 21  19 - 32 mEq/L    Glucose, Bld 212 (*) 70 - 99 mg/dL    BUN 22  6 - 23 mg/dL    Creatinine, Ser 4.01  0.50 - 1.35 mg/dL    Calcium 9.1  8.4 - 02.7 mg/dL    GFR calc non Af Amer 89 (*) >90 mL/min    GFR calc Af Amer >90  >90 mL/min   CBC     Status: Abnormal   Collection Time   04/17/12  5:45 AM      Component Value Range Comment   WBC 7.7  4.0 - 10.5 K/uL    RBC 3.57 (*) 4.22 - 5.81 MIL/uL    Hemoglobin 8.5 (*) 13.0 - 17.0 g/dL    HCT 25.3 (*) 66.4 - 52.0 %    MCV 76.2 (*) 78.0 - 100.0 fL    MCH 23.8 (*) 26.0 - 34.0 pg    MCHC 31.3  30.0 - 36.0 g/dL    RDW 40.3 (*) 47.4 - 15.5 %    Platelets 324  150 - 400 K/uL   GLUCOSE, CAPILLARY     Status: Abnormal   Collection Time   04/17/12  7:50 AM      Component Value Range Comment   Glucose-Capillary 194 (*) 70 - 99 mg/dL    Comment 1 Notify RN     GLUCOSE, CAPILLARY     Status: Abnormal   Collection Time   04/17/12 12:33 PM      Component Value Range Comment   Glucose-Capillary 206 (*)  70 - 99 mg/dL    Comment 1 Notify RN        HPI : [name] is a 77 y.o. male brought in by his nursing home with c/o hematemesis, per his nursing home he had a single episode of hematemesis characterized by vomiting up dark blood. Patient unfortunately is unable to add much to his history taking at this time as he has significant AMS possibly due to delirium, although with that said his mental status is apparently like this some days at baseline according to his son [name] whom I spoke with on the phone (has good days and bad days).  HOSPITAL COURSE:  Questionable GI Bleed vs HEMOPTYSIS  - Hematemesis x1 prior to admission, thought to be secondary to upper airway Output from ileostomy checked twice and is negative for Hemoccult.  -Assumed and initially to be GI bleed and was placed on Protonix drip, now changed to  daily.  -No evidence of GI bleeding, this could be some of his medications such as ferrous sulfate which can get his vomitus dark   -CT of abdomen and pelvis negative for abnormalities.  -Patient seen by ENT, Dr. Emeline Darling scope examination did not show any evidence of bleeding.  we suctioned out dry blood on the tongue on several occasions, this was thought to be secondary to oropharyngeal in origin, no lung pathology found, output from ileostomy was quite negative At this point hemoglobin stable and repeat CBC in a week Continue PPI Asa dc because of bleeding  UTI  -Complicated UTI, indwelling Foley catheter related UTI with Morganella morganii.  -I will obtain blood cultures, start Rocephin 1 g daily via peripheral IV. For 5 days Altered mental status  -Likely secondary to UTI  Dysphagia  -Has chronic dysphagia secondary to old stroke. SLP consulted.  -SLP recommended dysphagia 2 with a thin liquids with known high risk of aspiration. , Tube feeds resumed  Hypothyroidism - check TSH, continue thyroid replacement therapy  DM  -On moderate dose SSI Q4H while NPO here, holding home meds  AMS  -According to patients son his current mental status could well be his baseline  -I he is clear for me today, he might make it progress.   Hyperkalemia - no evidence of renal failure, will hold his home potassium. Received Kayexalate 2 days ago , BMP in one week  Anemia - HGB 8.5 down from 9.9 on 10/31, no indications for transfusion at this time. Hemoglobin at baseline   WOC consult  Reason for Consult: eval sacral pressure ulcer. Pt known to this WOC from previous evaluation in Oct. 2013. Was followed by Wound Care center for some time for chronic Stage IV pressure ulcer. Pt from SNF. Pt has had diverting ostomy to allow his pressure ulcer to heal. He has LALM at the facility per his previous reports, his baseline mental status is questionable. At first I requested pts permission to assess the wound  and the patient refused, after discussing this with the bedside nurse the pt was agreeable to let me assess.  Wound type: Stage IV pressure ulcer-sacrum, palpable bone but not exposed  Pressure Ulcer POA: Yes  Measurement: 6cm x 3.5cm x 4cm  Wound bed: pink, moist, limited granulation with large amount of bone palpable in the wound bed center, tissue is dark but not necrotic, does have some tunneling at 6 o'clock that is the deepest measurable site (4cm )  Drainage (amount, consistency, odor) serosanguinous, no odor  Periwound: scarring and  reepithelialization some maceration of the periwound edges  Dressing procedure/placement/frequency: will order normal saline moist to moist dressings. Air mattress in place.  Will need long term follow up in a wound care center.     Code Status: full     Discharge Exam:  Blood pressure 136/67, pulse 90, temperature 97.8 F (36.6 C), temperature source Oral, resp. rate 18, height 5\' 10"  (1.778 m), weight 95.412 kg (210 lb 5.5 oz), SpO2 100.00%.   General: NAD, resting comfortably in bed  Eyes: PEERLA EOMI, facial tic with eye closing noted (also noted on prior exam)  ENT: black / redish substance possibly digested blood in mouth and lips  Neck: supple w/o JVD  Cardiovascular: RRR w/o MRG  Respiratory: CTA B  Abdomen: soft, nt, nd, bs+, blackish substance in G tube  Skin: no rash nor lesion  Musculoskeletal: MAE, full ROM all 4 extremities  Psychiatric: normal tone and affect  Neurologic: alert, not oriented (patient thinks that he was just at his 45 year old mothers funeral yesterday)       Discharge Orders    Future Orders Please Complete By Expires   Diet - low sodium heart healthy      Increase activity slowly           Signed: Johnae [name] 04/17/2012, 1:41 PM

## 2012-04-17 NOTE — Clinical Social Work Note (Signed)
Clinical Social Worker was informed of discharge today. CSW notified facility, [facility]. Facility is able to accept patient back without prior authorization from [employer]. CSW will complete discharge packet. Patient will be transported via ambulance.   Rozetta Nunnery MSW, [employer] [phone]

## 2012-04-17 NOTE — Progress Notes (Addendum)
NUTRITION FOLLOW UP  Intervention:   1. Continue current regimen of Glucerna 1.2 at 75 ml/hr with 30 ml Prostat BID via PEG. Total goal rate and protein supplement provides 2360 kcals, 138 gm protein, 1449 ml of free water. 2. Once IVF discontinued, recommend free water flushes of 250 ml QID to provide an additional 1000 ml daily. SNF RD to adjust prn. 3. RD to continue to follow nutrition care plan  Nutrition Dx:   Inadequate oral intake related to hx of dysphagia as evidenced by use of PEG for main source of nutrition.  Goal:   Pt to meet >/= 90% of their estimated nutrition needs.  Monitor:   weight trends, lab trends, I/O's, SLP eval/rec's; TF tolerance  Assessment:   BSE completed 12/31 with recommendations for po intake with known risk of aspiration, and pt was placed on Dysphagia 2 with thin liquids. RN reports pt is currently tolerating TF regimen well at this time.   Height: Ht Readings from Last 1 Encounters:  04/13/12 5\' 10"  (1.778 m)    Weight Status:  No new wt Wt Readings from Last 1 Encounters:  04/17/12 210 lb 5.5 oz (95.412 kg)    Re-estimated needs:  Kcal: 2100 - 2300 kcal Protein: 115 - 130 grams Fluid: 2 - 2.2 liters  Skin: stage IV sacrum  Diet Order: Dysphagia 2 with thin liquids TF Order: Glucerna 1.2 at 77 with 30 ml Prostat BID   Intake/Output Summary (Last 24 hours) at 04/17/12 1434 Last data filed at 04/17/12 0600  Gross per 24 hour  Intake   2760 ml  Output   1000 ml  Net   1760 ml    Last BM: 1/2   Labs:   Lab 04/17/12 0545 04/15/12 1148 04/14/12 1619  NA 136 140 140  K 3.7 3.8 4.1  CL 103 106 105  CO2 21 21 21   BUN 22 27* 30*  CREATININE 0.73 0.84 0.89  CALCIUM 9.1 9.6 9.7  MG -- -- --  PHOS -- -- --  GLUCOSE 212* 198* 112*    CBG (last 3)   Basename 04/17/12 1233 04/17/12 0750 04/17/12 0412  GLUCAP 206* 194* 192*    Scheduled Meds:   . atropine  2 drop Sublingual Q6H  . cefTRIAXone (ROCEPHIN)  IV  1 g  Intravenous Q24H  . citalopram  10 mg Oral Daily  . feeding supplement  30 mL Per Tube BID  . ferrous sulfate  300 mg Per Tube BID  . gabapentin  300 mg Oral QHS  . insulin aspart  0-15 Units Subcutaneous Q4H  . insulin glargine  20 Units Subcutaneous QHS  . levothyroxine  100 mcg Oral QAC breakfast  . liothyronine  25 mcg Oral QAC breakfast  . metoprolol tartrate  25 mg Oral BID  . mupirocin ointment   Nasal TID  . nystatin   Topical BID  . pantoprazole sodium  40 mg Per Tube Daily  . saccharomyces boulardii  250 mg Oral BID  . sodium chloride  2 spray Each Nare TID  . traZODone  25 mg Oral QHS  . vitamin C  500 mg Oral BID  . zinc sulfate  220 mg Oral Daily    Continuous Infusions:   . sodium chloride 75 mL/hr (04/17/12 0132)  . feeding supplement (GLUCERNA 1.2 CAL) 1,000 mL (04/16/12 2322)    Jarold Motto MS, RD, LDN Pager: [phone] After-hours pager: [phone]

## 2012-04-23 LAB — CULTURE, BLOOD (ROUTINE X 2): Culture: NO GROWTH

## 2012-04-30 ENCOUNTER — Encounter (HOSPITAL_COMMUNITY): Payer: Self-pay

## 2012-04-30 ENCOUNTER — Emergency Department (HOSPITAL_COMMUNITY): Payer: Medicare Other

## 2012-04-30 ENCOUNTER — Inpatient Hospital Stay (HOSPITAL_COMMUNITY)
Admission: EM | Admit: 2012-04-30 | Discharge: 2012-05-06 | DRG: 871 | Disposition: A | Discharge status: Home / Self Care | Payer: Medicare Other | Attending: Internal Medicine | Admitting: Internal Medicine

## 2012-04-30 DIAGNOSIS — J690 Pneumonitis due to inhalation of food and vomit: Secondary | ICD-10-CM | POA: Diagnosis present

## 2012-04-30 DIAGNOSIS — J9600 Acute respiratory failure, unspecified whether with hypoxia or hypercapnia: Secondary | ICD-10-CM | POA: Diagnosis present

## 2012-04-30 DIAGNOSIS — I798 Other disorders of arteries, arterioles and capillaries in diseases classified elsewhere: Secondary | ICD-10-CM | POA: Diagnosis present

## 2012-04-30 DIAGNOSIS — A0472 Enterocolitis due to Clostridium difficile, not specified as recurrent: Secondary | ICD-10-CM | POA: Diagnosis present

## 2012-04-30 DIAGNOSIS — L89159 Pressure ulcer of sacral region, unspecified stage: Secondary | ICD-10-CM

## 2012-04-30 DIAGNOSIS — J9601 Acute respiratory failure with hypoxia: Secondary | ICD-10-CM | POA: Diagnosis present

## 2012-04-30 DIAGNOSIS — K219 Gastro-esophageal reflux disease without esophagitis: Secondary | ICD-10-CM | POA: Diagnosis present

## 2012-04-30 DIAGNOSIS — N390 Urinary tract infection, site not specified: Secondary | ICD-10-CM | POA: Diagnosis present

## 2012-04-30 DIAGNOSIS — I6930 Unspecified sequelae of cerebral infarction: Secondary | ICD-10-CM

## 2012-04-30 DIAGNOSIS — E860 Dehydration: Secondary | ICD-10-CM | POA: Diagnosis present

## 2012-04-30 DIAGNOSIS — R6510 Systemic inflammatory response syndrome (SIRS) of non-infectious origin without acute organ dysfunction: Secondary | ICD-10-CM | POA: Diagnosis present

## 2012-04-30 DIAGNOSIS — G9341 Metabolic encephalopathy: Secondary | ICD-10-CM | POA: Diagnosis present

## 2012-04-30 DIAGNOSIS — L8994 Pressure ulcer of unspecified site, stage 4: Secondary | ICD-10-CM | POA: Diagnosis present

## 2012-04-30 DIAGNOSIS — R000 Tachycardia, unspecified: Secondary | ICD-10-CM

## 2012-04-30 DIAGNOSIS — J189 Pneumonia, unspecified organism: Secondary | ICD-10-CM

## 2012-04-30 DIAGNOSIS — E872 Acidosis, unspecified: Secondary | ICD-10-CM | POA: Diagnosis present

## 2012-04-30 DIAGNOSIS — E039 Hypothyroidism, unspecified: Secondary | ICD-10-CM | POA: Diagnosis present

## 2012-04-30 DIAGNOSIS — R509 Fever, unspecified: Secondary | ICD-10-CM | POA: Diagnosis present

## 2012-04-30 DIAGNOSIS — I69991 Dysphagia following unspecified cerebrovascular disease: Secondary | ICD-10-CM

## 2012-04-30 DIAGNOSIS — D649 Anemia, unspecified: Secondary | ICD-10-CM

## 2012-04-30 DIAGNOSIS — E1159 Type 2 diabetes mellitus with other circulatory complications: Secondary | ICD-10-CM | POA: Diagnosis present

## 2012-04-30 DIAGNOSIS — L89109 Pressure ulcer of unspecified part of back, unspecified stage: Secondary | ICD-10-CM | POA: Diagnosis present

## 2012-04-30 DIAGNOSIS — R4182 Altered mental status, unspecified: Secondary | ICD-10-CM

## 2012-04-30 DIAGNOSIS — I10 Essential (primary) hypertension: Secondary | ICD-10-CM | POA: Diagnosis present

## 2012-04-30 DIAGNOSIS — L89154 Pressure ulcer of sacral region, stage 4: Secondary | ICD-10-CM | POA: Diagnosis present

## 2012-04-30 DIAGNOSIS — Z79899 Other long term (current) drug therapy: Secondary | ICD-10-CM

## 2012-04-30 DIAGNOSIS — R109 Unspecified abdominal pain: Secondary | ICD-10-CM

## 2012-04-30 DIAGNOSIS — Z931 Gastrostomy status: Secondary | ICD-10-CM

## 2012-04-30 DIAGNOSIS — E876 Hypokalemia: Secondary | ICD-10-CM | POA: Diagnosis not present

## 2012-04-30 DIAGNOSIS — K920 Hematemesis: Secondary | ICD-10-CM

## 2012-04-30 DIAGNOSIS — A419 Sepsis, unspecified organism: Principal | ICD-10-CM | POA: Diagnosis present

## 2012-04-30 DIAGNOSIS — E1351 Other specified diabetes mellitus with diabetic peripheral angiopathy without gangrene: Secondary | ICD-10-CM | POA: Diagnosis present

## 2012-04-30 DIAGNOSIS — I69391 Dysphagia following cerebral infarction: Secondary | ICD-10-CM

## 2012-04-30 DIAGNOSIS — D72829 Elevated white blood cell count, unspecified: Secondary | ICD-10-CM | POA: Diagnosis present

## 2012-04-30 DIAGNOSIS — Z794 Long term (current) use of insulin: Secondary | ICD-10-CM

## 2012-04-30 DIAGNOSIS — I69959 Hemiplegia and hemiparesis following unspecified cerebrovascular disease affecting unspecified side: Secondary | ICD-10-CM

## 2012-04-30 DIAGNOSIS — R112 Nausea with vomiting, unspecified: Secondary | ICD-10-CM | POA: Diagnosis present

## 2012-04-30 DIAGNOSIS — Z933 Colostomy status: Secondary | ICD-10-CM

## 2012-04-30 LAB — COMPREHENSIVE METABOLIC PANEL
ALT: 23 U/L (ref 0–53)
Alkaline Phosphatase: 115 U/L (ref 39–117)
BUN: 54 mg/dL — ABNORMAL HIGH (ref 6–23)
CO2: 23 mEq/L (ref 19–32)
Chloride: 97 mEq/L (ref 96–112)
GFR calc Af Amer: >90 mL/min (ref >90)
GFR calc non Af Amer: 79 mL/min — ABNORMAL LOW (ref >90)
Glucose, Bld: 349 mg/dL — ABNORMAL HIGH (ref 70–99)
Potassium: 4.6 mEq/L (ref 3.5–5.1)
Sodium: 137 mEq/L (ref 135–145)
Total Bilirubin: 0.4 mg/dL (ref 0.3–1.2)
Total Protein: 9.4 g/dL — ABNORMAL HIGH (ref 6.0–8.3)

## 2012-04-30 LAB — CBC WITH DIFFERENTIAL/PLATELET
Basophils Absolute: 0 10*3/uL (ref 0.0–0.1)
Basophils Relative: 0 % (ref 0–1)
HCT: 30.9 % — ABNORMAL LOW (ref 39.0–52.0)
MCHC: 32.7 g/dL (ref 30.0–36.0)
Monocytes Absolute: 0.8 10*3/uL (ref 0.1–1.0)
Neutro Abs: 11.6 10*3/uL — ABNORMAL HIGH (ref 1.7–7.7)
Platelets: 427 10*3/uL — ABNORMAL HIGH (ref 150–400)
RDW: 18.8 % — ABNORMAL HIGH (ref 11.5–15.5)

## 2012-04-30 LAB — PROTIME-INR: Prothrombin Time: 15.4 seconds — ABNORMAL HIGH (ref 11.6–15.2)

## 2012-04-30 LAB — URINALYSIS, ROUTINE W REFLEX MICROSCOPIC
Glucose, UA: 250 mg/dL — AB
Ketones, ur: NEGATIVE mg/dL
Nitrite: NEGATIVE
Protein, ur: >300 mg/dL — AB
pH: 7.5 (ref 5.0–8.0)

## 2012-04-30 LAB — URINE MICROSCOPIC-ADD ON

## 2012-04-30 LAB — LACTIC ACID, PLASMA: Lactic Acid, Venous: 3.4 mmol/L — ABNORMAL HIGH (ref 0.5–2.2)

## 2012-04-30 MED ORDER — VANCOMYCIN HCL 10 G IV SOLR
1250.0000 mg | Freq: Once | INTRAVENOUS | Status: AC
  Administered 2012-04-30: 1250 mg via INTRAVENOUS
  Filled 2012-04-30: qty 1250

## 2012-04-30 MED ORDER — VANCOMYCIN HCL 1000 MG IV SOLR: 15.0000 mg/kg | Freq: Once | INTRAVENOUS | Status: DC

## 2012-04-30 MED ORDER — SODIUM CHLORIDE 0.9 % IV BOLUS (SEPSIS)
500.0000 mL | Freq: Once | INTRAVENOUS | Status: AC
  Administered 2012-04-30: 500 mL via INTRAVENOUS

## 2012-04-30 MED ORDER — SODIUM CHLORIDE 0.9 % IV BOLUS (SEPSIS)
1000.0000 mL | Freq: Once | INTRAVENOUS | Status: AC
  Administered 2012-04-30: 1000 mL via INTRAVENOUS

## 2012-04-30 MED ORDER — ACETAMINOPHEN 500 MG PO TABS
1000.0000 mg | ORAL_TABLET | Freq: Once | ORAL | Status: AC
  Administered 2012-04-30: 1000 mg
  Filled 2012-04-30: qty 2

## 2012-04-30 MED ORDER — IOHEXOL 300 MG/ML  SOLN
100.0000 mL | Freq: Once | INTRAMUSCULAR | Status: AC | PRN
  Administered 2012-04-30: 100 mL via INTRAVENOUS

## 2012-04-30 MED ORDER — PIPERACILLIN-TAZOBACTAM 3.375 G IVPB 30 MIN
3.3750 g | Freq: Once | INTRAVENOUS | Status: AC
  Administered 2012-04-30: 3.375 g via INTRAVENOUS
  Filled 2012-04-30: qty 50

## 2012-04-30 NOTE — ED Provider Notes (Signed)
History     CSN: [id number]  Arrival date & time 04/30/12  1654   First MD Initiated Contact with Patient 04/30/12 1701      Chief Complaint  Patient presents with  . Emesis    (Consider location/radiation/quality/duration/timing/severity/associated sxs/prior treatment) Patient is a 77 y.o. male presenting with general illness. The history is provided by the patient. No language interpreter was used.  Illness  The current episode started yesterday. The problem occurs continuously. The problem has been gradually worsening. The problem is severe. Nothing relieves the symptoms. Nothing aggravates the symptoms. Associated symptoms include a fever, abdominal pain, nausea and vomiting. Pertinent negatives include no constipation, no diarrhea, no congestion, no headaches, no sore throat, no cough and no rash.    Past Medical History  Diagnosis Date  . Stroke     Hx of with right hemiparesis  . Hypertension   . Endocarditis, mitral valve, syphilitic   . Hypothyroidism   . Anorexia   . Dysphagia   . Reflux   . Bacteremia   . Clostridium difficile colitis   . CVA (cerebral vascular accident)   . Pressure ulcer(707.0)   . Diabetes mellitus, type 2   . Anemia   . Stroke     Past Surgical History  Procedure Date  . Colostomy   . Peg placement   . Hernia repair     Family History  Problem Relation Age of Onset  . Cancer Mother   . Other Father     old age after a fall  . Alzheimer's disease Sister     History  Substance Use Topics  . Smoking status: Never Smoker   . Smokeless tobacco: Not on file  . Alcohol Use: No      Review of Systems  Constitutional: Positive for fever. Negative for chills.  HENT: Negative for congestion and sore throat.   Respiratory: Positive for shortness of breath. Negative for cough.   Cardiovascular: Negative for chest pain and leg swelling.  Gastrointestinal: Positive for nausea, vomiting and abdominal pain. Negative for diarrhea and  constipation.  Genitourinary: Negative for dysuria and frequency.  Skin: Negative for color change and rash.  Neurological: Negative for dizziness and headaches.  Psychiatric/Behavioral: Negative for confusion and agitation.  All other systems reviewed and are negative.    Allergies  Review of patient's allergies indicates no known allergies.  Home Medications   Current Outpatient Rx  Name  Route  Sig  Dispense  Refill  . ACETAMINOPHEN 160 MG/5ML PO ELIX   Tube   Give 640 mg by tube every 6 (six) hours as needed. For pain          . ACETAMINOPHEN 650 MG RE SUPP   Rectal   Place 650 mg rectally every 4 (four) hours as needed. For fever         . ATROPINE SULFATE 1 % OP SOLN   Oral   Take 2 drops by mouth every 6 (six) hours.           Marland Kitchen CITALOPRAM HYDROBROMIDE 10 MG PO TABS   Oral   Take 10 mg by mouth daily.         Marland Kitchen FERROUS SULFATE 300 (60 FE) MG/5ML PO SYRP   Tube   Give 300 mg by tube 2 (two) times daily.           Marland Kitchen GABAPENTIN 300 MG PO CAPS   Tube   Give 300 mg by tube at bedtime.           Marland Kitchen  GLYBURIDE 2.5 MG PO TABS   Oral   Take 2.5 mg by mouth 2 (two) times daily with a meal.         . INSULIN ASPART 100 UNIT/ML [place]vent SOLN   Subcutaneous   Inject 3 Units into the skin 3 (three) times daily before meals. For CBG over 200   1 vial   100   . INSULIN GLARGINE 100 UNIT/ML Paul SOLN   Subcutaneous   Inject 20 Units into the skin at bedtime.          Marland Kitchen LEVOTHYROXINE SODIUM 100 MCG PO TABS   Oral   Take 100 mcg by mouth daily.         Marland Kitchen LIOTHYRONINE SODIUM 25 MCG PO TABS   Oral   Take 25 mcg by mouth daily.         Marland Kitchen METOPROLOL TARTRATE 25 MG PO TABS   Tube   Give 25 mg by tube 2 (two) times daily.           Carma Leaven M PLUS PO TABS   Tube   Give 1 tablet by tube daily.           Marland Kitchen GLUCERNA 1.2 CAL PO LIQD   Per Tube   Place 1,000 mLs into feeding tube continuous.   1000 mL   2   . JEVITY 1.2 CAL PO LIQD   Per Tube    Place 1,000 mLs into feeding tube continuous.   1500 mL   0   . PANTOPRAZOLE 40 MG/20 ML SUSPENSION   Per Tube   Place 20 mLs (40 mg total) into feeding tube daily.   30 each   0   . POTASSIUM CHLORIDE 20 MEQ/15ML (10%) PO LIQD   Tube   Give 10 mEq by tube daily.           Marland Kitchen PROMETHAZINE HCL 25 MG PO TABS   Tube   Give 25 mg by tube every 6 (six) hours as needed. For nausea         . PSEUDOEPHEDRINE HCL 30 MG PO TABS   Oral   Take 30 mg by mouth every 6 (six) hours as needed. For secretions         . RANITIDINE HCL 75 MG PO TABS   Tube   Give 150 mg by tube 2 (two) times daily.           Marland Kitchen SACCHAROMYCES BOULARDII 250 MG PO CAPS   Tube   Give 250 mg by tube 2 (two) times daily.           Marland Kitchen VITAMIN B-1 100 MG PO TABS   Tube   Give 100 mg by tube every morning.           Marland Kitchen TRAMADOL HCL 50 MG PO TABS   Tube   Give 50 mg by tube every 6 (six) hours as needed.          . TRAZODONE 25 MG HALF TABLET   Oral   Take 25 mg by mouth at bedtime.         Marland Kitchen VITAMIN C 500 MG PO TABS   Tube   Give 500 mg by tube 2 (two) times daily.           Marland Kitchen ZINC SULFATE 220 MG PO CAPS   Oral   Take 1 capsule (220 mg total) by mouth daily.   30 capsule  BP 116/60  Pulse 123  Temp 102.6 F (39.2 C) (Oral)  Resp 22  SpO2 92%  Physical Exam  Constitutional: He is oriented to person, place, and time. He appears well-developed and well-nourished. He appears listless. He appears ill. He appears distressed.  HENT:  Head: Normocephalic and atraumatic.  Mouth/Throat: Mucous membranes are dry.  Eyes: EOM are normal. Pupils are equal, round, and reactive to light.  Neck: Normal range of motion. Neck supple.  Cardiovascular: Regular rhythm.  Tachycardia present.   Pulmonary/Chest: Effort normal. No respiratory distress. He has decreased breath sounds in the right lower field and the left lower field.  Abdominal: Soft. He exhibits distension. There is generalized  tenderness. There is no rigidity, no rebound, no guarding, no tenderness at McBurney's point and negative Murphy's sign.    Musculoskeletal: Normal range of motion. He exhibits no edema.       Back:  Neurological: He is oriented to person, place, and time. He appears listless. GCS eye subscore is 4. GCS verbal subscore is 4. GCS motor subscore is 6.       Right sided hemiparesis   Skin: Skin is warm. He is diaphoretic.  Psychiatric: His behavior is normal. His speech is delayed.    ED Course  Procedures (including critical care time)  Results for orders placed during the hospital encounter of 04/30/12  URINALYSIS, ROUTINE W REFLEX MICROSCOPIC      Component Value Range   Color, Urine YELLOW  YELLOW   APPearance CLOUDY (*) CLEAR   Specific Gravity, Urine 1.021  1.005 - 1.030   pH 7.5  5.0 - 8.0   Glucose, UA 250 (*) NEGATIVE mg/dL   Hgb urine dipstick LARGE (*) NEGATIVE   Bilirubin Urine NEGATIVE  NEGATIVE   Ketones, ur NEGATIVE  NEGATIVE mg/dL   Protein, ur >161 (*) NEGATIVE mg/dL   Urobilinogen, UA 0.2  0.0 - 1.0 mg/dL   Nitrite NEGATIVE  NEGATIVE   Leukocytes, UA SMALL (*) NEGATIVE  COMPREHENSIVE METABOLIC PANEL      Component Value Range   Sodium 137  135 - 145 mEq/L   Potassium 4.6  3.5 - 5.1 mEq/L   Chloride 97  96 - 112 mEq/L   CO2 23  19 - 32 mEq/L   Glucose, Bld 349 (*) 70 - 99 mg/dL   BUN 54 (*) 6 - 23 mg/dL   Creatinine, Ser 0.96  0.50 - 1.35 mg/dL   Calcium 9.9  8.4 - 04.5 mg/dL   Total Protein 9.4 (*) 6.0 - 8.3 g/dL   Albumin 2.2 (*) 3.5 - 5.2 g/dL   AST 27  0 - 37 U/L   ALT 23  0 - 53 U/L   Alkaline Phosphatase 115  39 - 117 U/L   Total Bilirubin 0.4  0.3 - 1.2 mg/dL   GFR calc non Af Amer 79 (*) >90 mL/min   GFR calc Af Amer >90  >90 mL/min  CBC WITH DIFFERENTIAL      Component Value Range   WBC 13.6 (*) 4.0 - 10.5 K/uL   RBC 4.02 (*) 4.22 - 5.81 MIL/uL   Hemoglobin 10.1 (*) 13.0 - 17.0 g/dL   HCT 40.9 (*) 81.1 - 91.4 %   MCV 76.9 (*) 78.0 - 100.0  fL   MCH 25.1 (*) 26.0 - 34.0 pg   MCHC 32.7  30.0 - 36.0 g/dL   RDW 78.2 (*) 95.6 - 21.3 %   Platelets 427 (*) 150 - 400 K/uL  Neutrophils Relative 86 (*) 43 - 77 %   Neutro Abs 11.6 (*) 1.7 - 7.7 K/uL   Lymphocytes Relative 8 (*) 12 - 46 %   Lymphs Abs 1.1  0.7 - 4.0 K/uL   Monocytes Relative 6  3 - 12 %   Monocytes Absolute 0.8  0.1 - 1.0 K/uL   Eosinophils Relative 0  0 - 5 %   Eosinophils Absolute 0.0  0.0 - 0.7 K/uL   Basophils Relative 0  0 - 1 %   Basophils Absolute 0.0  0.0 - 0.1 K/uL  LACTIC ACID, PLASMA      Component Value Range   Lactic Acid, Venous 3.4 (*) 0.5 - 2.2 mmol/L  TROPONIN I      Component Value Range   Troponin I <0.30  <0.30 ng/mL  PROTIME-INR      Component Value Range   Prothrombin Time 15.4 (*) 11.6 - 15.2 seconds   INR 1.24  0.00 - 1.49  URINE MICROSCOPIC-ADD ON      Component Value Range   WBC, UA 3-6  <3 WBC/hpf   RBC / HPF 21-50  <3 RBC/hpf   Bacteria, UA MANY (*) RARE    CT Abdomen Pelvis W Contrast (Final result)   Result time:04/30/12 2115    Final result by Rad Results In Interface (04/30/12 21:15:26)    Narrative:   *RADIOLOGY REPORT*  Clinical Data: Sepsis. Abdominal pain and nausea. Vomiting. Fever.  CT ABDOMEN AND PELVIS WITH CONTRAST  Technique: Multidetector CT imaging of the abdomen and pelvis was performed following the standard protocol during bolus administration of intravenous contrast.  Contrast: OMNIPAQUE IOHEXOL 300 MG/ML SOLN  Comparison: CT of the abdomen and pelvis 04/13/2012.  Findings: Bilateral lower lobe airspace disease progressed, worrisome for pneumonia. The heart size is normal. Pacing wires are in place. No significant pleural or pericardial effusion is present.  The liver and spleen are within normal limits. The gastrojejunostomy tube is in place. The tip is at the ligament of Treitz. The pancreas is mildly atrophic but within normal limits. The common bile duct is within normal limits  following cholecystectomy. Adrenal glands are normal bilaterally. A 6 mm posterior nonobstructing stone is present in the left kidney. There are several sub-centimeter hypodense lesions bilaterally, likely representing simple cysts.  Slight ectasia of the abdominal aorta is noted. There is no aneurysm.  A Foley catheter is present within the collapsed urinary bladder. Penile prosthesis is in place. A colostomy and mucous fistula is present in the left lower quadrant. The bowel is otherwise unremarkable. No significant adenopathy or free fluid is present.  A lytic lesion in the left iliac bone, adjacent to the SI joint is slightly more prominent than on the prior exam.  IMPRESSION:  1. No acute or focal abnormality to explain the patient's symptoms. 2. Nonobstructing left renal stone is stable. 3. Status post cholecystectomy. 4. Satisfactory positioning of the gastrojejunostomy tube. 5. Left lower quadrant colectomy without evidence for complication. 6. Atherosclerosis. 7. New onset of bilateral lower lobe airspace disease is concerning for pneumonia.   Original Report Authenticated By: Marin Roberts, M.D.             DG Chest Portable 1 View (Final result)   Result time:04/30/12 1757    Final result by Rad Results In Interface (04/30/12 17:57:36)    Narrative:   *RADIOLOGY REPORT*  Clinical Data: Emesis, cough and wheezing.  PORTABLE CHEST - 1 VIEW  Comparison: CT chest 04/[birth year]/1938 chest  radiograph 02/12/2012.  Findings: Trachea is midline. Heart size stable. Pacemaker lead tips project over the right atrium and right ventricle. Lungs are somewhat low in volume with central pulmonary vascular congestion. Lingular subsegmental atelectasis. No definite pleural fluid.  IMPRESSION: Central pulmonary vascular congestion without overt air space consolidation or edema.   Original Report Authenticated By: Leanna Battles, M.D.         Date: 05/01/2012   Rate: 121  Rhythm: sinus tachycardia  QRS Axis: normal  Intervals: normal  ST/T Wave abnormalities: nonspecific ST/T changes  Conduction Disutrbances:none  Narrative Interpretation:   Old EKG Reviewed: changes noted   No results found.   No diagnosis found.    MDM  Pt lives at SNF - hx of DM, CVA w/ right sided hemiparesis, also hx of DM, c.diff, now w/ acute onset nausea/vomting abdominal pain and fever. Clear emesis x 1 yesterday. Today 1 episode dark emesis - no gross blood or bile. Pt has feeding tube and and colostomy. Noted decreased responsiveness today and fever. EMS called. On arrival to ED pt admits to mild diffuse abd pain. No other complaints  Exam: febrile at 102, normotensive, HR 123, tachypnea and hypoxia. Sleeping but arousable, following commands. Exam significant for diminished breath sounds at bases, dry oropharynx, abd mildly distended, BS present, mild diffuse ttp, no rebound or guarding. Green stool in colostomy, stage 4 sacral decubitus ulcer - clean base.  DDx/plan: sepsis - source possibly respiratory vs GI. Possible aspiration pneumonia.  Doubt wound infection. Will check blood and urine cultures, u/a, coags, troponin, cmp, lactic acid, cbc, c.diff, CXR and CT abd. Will place on vanc/zosyn. Give tylenol for fever and IVF resuscitation.   Course: reassessed, vitals stable, mental status improved, CT abd reveals likely BLL infiltrates concerning for developing pneumonia. CXR - NACPF, WBC 13.6, lactic acid 3.4 - given total of 3L IVF. U/a neg for infection. Remainder of labs unremarkable. D/w internal medicine and pt admitted to step down unit in serious. Condition.   1. Sepsis   2. Aspiration pneumonia   3. Fever   4. Tachycardia   5. Leukocytosis   6. Abdominal  pain, other specified site   7. Sacral decubitus ulcer   8. Lactic acidosis   9. Anemia   10. Nausea & vomiting   11. SIRS (systemic inflammatory response syndrome)             Audelia Hives, MD 05/01/12 [id number]

## 2012-04-30 NOTE — ED Notes (Signed)
Replaced both suction containers as they were full.

## 2012-04-30 NOTE — ED Notes (Signed)
Pt to ED via [facility] from [facility] with c/o elevated temp and vomiting.

## 2012-05-01 ENCOUNTER — Encounter (HOSPITAL_COMMUNITY): Payer: Self-pay | Admitting: Internal Medicine

## 2012-05-01 DIAGNOSIS — D649 Anemia, unspecified: Secondary | ICD-10-CM | POA: Diagnosis present

## 2012-05-01 DIAGNOSIS — G9341 Metabolic encephalopathy: Secondary | ICD-10-CM | POA: Diagnosis present

## 2012-05-01 DIAGNOSIS — R6889 Other general symptoms and signs: Secondary | ICD-10-CM

## 2012-05-01 DIAGNOSIS — J189 Pneumonia, unspecified organism: Secondary | ICD-10-CM | POA: Diagnosis present

## 2012-05-01 DIAGNOSIS — J9601 Acute respiratory failure with hypoxia: Secondary | ICD-10-CM | POA: Diagnosis present

## 2012-05-01 DIAGNOSIS — R112 Nausea with vomiting, unspecified: Secondary | ICD-10-CM

## 2012-05-01 DIAGNOSIS — R6510 Systemic inflammatory response syndrome (SIRS) of non-infectious origin without acute organ dysfunction: Secondary | ICD-10-CM

## 2012-05-01 DIAGNOSIS — R509 Fever, unspecified: Secondary | ICD-10-CM | POA: Diagnosis present

## 2012-05-01 DIAGNOSIS — A0472 Enterocolitis due to Clostridium difficile, not specified as recurrent: Secondary | ICD-10-CM | POA: Diagnosis present

## 2012-05-01 DIAGNOSIS — J690 Pneumonitis due to inhalation of food and vomit: Secondary | ICD-10-CM

## 2012-05-01 DIAGNOSIS — E860 Dehydration: Secondary | ICD-10-CM | POA: Diagnosis present

## 2012-05-01 DIAGNOSIS — D72829 Elevated white blood cell count, unspecified: Secondary | ICD-10-CM | POA: Diagnosis present

## 2012-05-01 LAB — CBC
HCT: 26.9 % — ABNORMAL LOW (ref 39.0–52.0)
MCH: 24.1 pg — ABNORMAL LOW (ref 26.0–34.0)
MCHC: 31.6 g/dL (ref 30.0–36.0)
MCV: 76.2 fL — ABNORMAL LOW (ref 78.0–100.0)
Platelets: 344 10*3/uL (ref 150–400)
RDW: 19.2 % — ABNORMAL HIGH (ref 11.5–15.5)
WBC: 8.4 10*3/uL (ref 4.0–10.5)

## 2012-05-01 LAB — INFLUENZA PANEL BY PCR (TYPE A & B)
Influenza A By PCR: NEGATIVE
Influenza B By PCR: NEGATIVE

## 2012-05-01 LAB — TYPE AND SCREEN

## 2012-05-01 LAB — GLUCOSE, CAPILLARY
Glucose-Capillary: 203 mg/dL — ABNORMAL HIGH (ref 70–99)
Glucose-Capillary: 173 mg/dL — ABNORMAL HIGH (ref 70–99)
Glucose-Capillary: 94 mg/dL (ref 70–99)
Glucose-Capillary: 88 mg/dL (ref 70–99)

## 2012-05-01 LAB — PROTIME-INR: INR: 1.38 (ref 0.00–1.49)

## 2012-05-01 LAB — MRSA PCR SCREENING: MRSA by PCR: POSITIVE — AB

## 2012-05-01 MED ORDER — ACETAMINOPHEN 325 MG PO TABS
650.0000 mg | ORAL_TABLET | Freq: Four times a day (QID) | ORAL | Status: DC | PRN
  Administered 2012-05-02 – 2012-05-05 (×4): 650 mg
  Filled 2012-05-01 (×4): qty 2

## 2012-05-01 MED ORDER — MUPIROCIN 2 % EX OINT
1.0000 "application " | TOPICAL_OINTMENT | Freq: Two times a day (BID) | CUTANEOUS | Status: AC
  Administered 2012-05-01 – 2012-05-04 (×6): 1 via NASAL
  Filled 2012-05-01: qty 22

## 2012-05-01 MED ORDER — SODIUM CHLORIDE 0.9 % IV SOLN
INTRAVENOUS | Status: DC
  Administered 2012-05-01: 18:00:00 via INTRAVENOUS
  Administered 2012-05-01: 150 mL/h via INTRAVENOUS
  Administered 2012-05-02: 15:00:00 via INTRAVENOUS
  Administered 2012-05-02: 1000 mL via INTRAVENOUS
  Administered 2012-05-03 – 2012-05-06 (×8): via INTRAVENOUS

## 2012-05-01 MED ORDER — ACETAMINOPHEN 325 MG PO TABS: 650.0000 mg | ORAL_TABLET | Freq: Four times a day (QID) | ORAL | Status: DC | PRN

## 2012-05-01 MED ORDER — LIOTHYRONINE SODIUM 25 MCG PO TABS
25.0000 ug | ORAL_TABLET | Freq: Every day | ORAL | Status: DC
  Administered 2012-05-01 – 2012-05-06 (×6): 25 ug
  Filled 2012-05-01 (×7): qty 1

## 2012-05-01 MED ORDER — ZINC SULFATE 220 (50 ZN) MG PO CAPS
220.0000 mg | ORAL_CAPSULE | Freq: Every day | ORAL | Status: DC
  Administered 2012-05-01 – 2012-05-06 (×6): 220 mg
  Filled 2012-05-01 (×6): qty 1

## 2012-05-01 MED ORDER — LEVOTHYROXINE SODIUM 112 MCG PO TABS
112.0000 ug | ORAL_TABLET | Freq: Every day | ORAL | Status: DC
  Filled 2012-05-01 (×2): qty 1

## 2012-05-01 MED ORDER — VITAMIN B-1 100 MG PO TABS
100.0000 mg | ORAL_TABLET | Freq: Every day | ORAL | Status: DC
  Administered 2012-05-01 – 2012-05-06 (×6): 100 mg
  Filled 2012-05-01 (×6): qty 1

## 2012-05-01 MED ORDER — METRONIDAZOLE IN NACL 5-0.79 MG/ML-% IV SOLN
500.0000 mg | Freq: Three times a day (TID) | INTRAVENOUS | Status: DC
  Administered 2012-05-01 – 2012-05-06 (×16): 500 mg via INTRAVENOUS
  Filled 2012-05-01 (×21): qty 100

## 2012-05-01 MED ORDER — PANTOPRAZOLE SODIUM 40 MG IV SOLR
8.0000 mg/h | INTRAVENOUS | Status: DC
  Administered 2012-05-01 – 2012-05-04 (×5): 8 mg/h via INTRAVENOUS
  Filled 2012-05-01 (×18): qty 80

## 2012-05-01 MED ORDER — SCOPOLAMINE 1 MG/3DAYS TD PT72
1.0000 | MEDICATED_PATCH | TRANSDERMAL | Status: DC
  Administered 2012-05-01 – 2012-05-04 (×2): 1.5 mg via TRANSDERMAL
  Filled 2012-05-01 (×2): qty 1

## 2012-05-01 MED ORDER — LEVOTHYROXINE SODIUM 112 MCG PO TABS
112.0000 ug | ORAL_TABLET | Freq: Every day | ORAL | Status: DC
  Administered 2012-05-01 – 2012-05-04 (×4): 112 ug
  Filled 2012-05-01 (×5): qty 1

## 2012-05-01 MED ORDER — VANCOMYCIN HCL 10 G IV SOLR
1250.0000 mg | Freq: Two times a day (BID) | INTRAVENOUS | Status: DC
  Administered 2012-05-01 – 2012-05-02 (×3): 1250 mg via INTRAVENOUS
  Filled 2012-05-01 (×4): qty 1250

## 2012-05-01 MED ORDER — CHLORHEXIDINE GLUCONATE CLOTH 2 % EX PADS
6.0000 | MEDICATED_PAD | Freq: Every day | CUTANEOUS | Status: AC
  Administered 2012-05-02 – 2012-05-04 (×3): 6 via TOPICAL

## 2012-05-01 MED ORDER — ATROPINE SULFATE 1 % OP SOLN
2.0000 [drp] | Freq: Three times a day (TID) | OPHTHALMIC | Status: DC
Start: 2012-05-01 — End: 2012-05-06
  Administered 2012-05-02 – 2012-05-03 (×4): via OPHTHALMIC
  Administered 2012-05-03: 2 [drp] via OPHTHALMIC
  Administered 2012-05-03 – 2012-05-04 (×2): via OPHTHALMIC
  Administered 2012-05-04 – 2012-05-06 (×2): 2 [drp] via OPHTHALMIC
  Filled 2012-05-01 (×2): qty 2

## 2012-05-01 MED ORDER — ZINC SULFATE 220 (50 ZN) MG PO CAPS
220.0000 mg | ORAL_CAPSULE | Freq: Every day | ORAL | Status: DC
  Filled 2012-05-01: qty 1

## 2012-05-01 MED ORDER — INSULIN ASPART 100 UNIT/ML ~~LOC~~ SOLN
0.0000 [IU] | Freq: Three times a day (TID) | SUBCUTANEOUS | Status: DC
  Administered 2012-05-01: 2 [IU] via SUBCUTANEOUS
  Administered 2012-05-01: 3 [IU] via SUBCUTANEOUS
  Administered 2012-05-05: 2 [IU] via SUBCUTANEOUS
  Administered 2012-05-05: 1 [IU] via SUBCUTANEOUS

## 2012-05-01 MED ORDER — PIPERACILLIN-TAZOBACTAM 3.375 G IVPB
3.3750 g | Freq: Three times a day (TID) | INTRAVENOUS | Status: DC
  Administered 2012-05-01 – 2012-05-02 (×4): 3.375 g via INTRAVENOUS
  Filled 2012-05-01 (×7): qty 50

## 2012-05-01 MED ORDER — SODIUM CHLORIDE 0.9 % IJ SOLN
3.0000 mL | Freq: Two times a day (BID) | INTRAMUSCULAR | Status: DC
  Administered 2012-05-02 – 2012-05-05 (×6): 3 mL via INTRAVENOUS

## 2012-05-01 MED ORDER — ONDANSETRON HCL 4 MG/2ML IJ SOLN: 4.0000 mg | Freq: Four times a day (QID) | INTRAMUSCULAR | Status: DC | PRN

## 2012-05-01 MED ORDER — LEVOFLOXACIN IN D5W 750 MG/150ML IV SOLN
750.0000 mg | INTRAVENOUS | Status: DC
  Administered 2012-05-01 – 2012-05-02 (×2): 750 mg via INTRAVENOUS
  Filled 2012-05-01 (×2): qty 150

## 2012-05-01 MED ORDER — ACETAMINOPHEN 650 MG RE SUPP: 650.0000 mg | Freq: Four times a day (QID) | RECTAL | Status: DC | PRN

## 2012-05-01 MED ORDER — PANTOPRAZOLE SODIUM 40 MG IV SOLR
80.0000 mg | Freq: Once | INTRAVENOUS | Status: AC
  Administered 2012-05-01: 80 mg via INTRAVENOUS
  Filled 2012-05-01: qty 80

## 2012-05-01 MED ORDER — ONDANSETRON HCL 4 MG PO TABS: 4.0000 mg | ORAL_TABLET | Freq: Four times a day (QID) | ORAL | Status: DC | PRN

## 2012-05-01 NOTE — Progress Notes (Signed)
Received positive MRSA results (patient nares) from [facility] in main lab. Patient placed on contact precautions. Wife at bedside and notified.

## 2012-05-01 NOTE — Progress Notes (Signed)
Positive c diff report called from lab. Patient placed on precautions and MD made aware.

## 2012-05-01 NOTE — Progress Notes (Signed)
Inpatient Diabetes Program Recommendations  AACE/ADA: New Consensus Statement on Inpatient Glycemic Control (2013)  Target Ranges:  Prepandial:   less than 140 mg/dL      Peak postprandial:   less than 180 mg/dL (1-2 hours)      Critically ill patients:  140 - 180 mg/dL   Reason for Visit: 9/14  CBGs 349 mg/dl        7/82  956 mg/dl  Inpatient Diabetes Program Recommendations Correction (SSI): Change Novolog correction scale to MODERATE every 4 hours while NPO and then AC & HS when eating.  Note: HgbA1C in December, 2013  was 8.7%.

## 2012-05-01 NOTE — H&P (Signed)
[name] is an 77 y.o. male.   Patient was seen and examined on May 01, 2012. History obtained from patient's wife, ER physician and previous records. Chief Complaint: Nausea vomiting. HPI: 77 year old male with history of stroke with right-sided hemiparesis who has been bedbound and was recently admitted 2 weeks ago for possible GI bleed was brought to the ER because of persistent nausea and vomiting. As per the patient's family patient had one episode of nausea vomiting 2 days ago and yesterday since morning patient has had multiple episodes with chocolate brown colored vomitus. Patient in addition became more lethargic and in the ER was found to be febrile. Chest x-ray revealed bilateral infiltrates concerning for pneumonia. Due to patient's persistent nausea vomiting CT abdomen and pelvis was done which did not show any acute in the abdomen but did show features concerning for pneumonia. Patient also has UA showed features of UTI. Patient on admission was found to have increased lactic acid level but blood pressure was in the 100s systolic. Patient was given 3 L normal saline and at this time is maintaining blood pressure systolic more than 100. Patient will be admitted for further management.  Past Medical History  Diagnosis Date  . Stroke     Hx of with right hemiparesis  . Hypertension   . Endocarditis, mitral valve, syphilitic   . Hypothyroidism   . Anorexia   . Dysphagia   . Reflux   . Bacteremia   . Clostridium difficile colitis   . CVA (cerebral vascular accident)   . Pressure ulcer(707.0)   . Diabetes mellitus, type 2   . Anemia   . Stroke     Past Surgical History  Procedure Date  . Colostomy   . Peg placement   . Hernia repair     Family History  Problem Relation Age of Onset  . Cancer Mother   . Other Father     old age after a fall  . Alzheimer's disease Sister    Social History:  reports that he has never smoked. He does not have any smokeless tobacco  history on file. He reports that he does not drink alcohol or use illicit drugs.  Allergies: No Known Allergies   (Not in a hospital admission)  Results for orders placed during the hospital encounter of 04/30/12 (from the past 48 hour(s))  COMPREHENSIVE METABOLIC PANEL     Status: Abnormal   Collection Time   04/30/12  5:47 PM      Component Value Range Comment   Sodium 137  135 - 145 mEq/L    Potassium 4.6  3.5 - 5.1 mEq/L    Chloride 97  96 - 112 mEq/L    CO2 23  19 - 32 mEq/L    Glucose, Bld 349 (*) 70 - 99 mg/dL    BUN 54 (*) 6 - 23 mg/dL    Creatinine, Ser 1.61  0.50 - 1.35 mg/dL    Calcium 9.9  8.4 - 09.6 mg/dL    Total Protein 9.4 (*) 6.0 - 8.3 g/dL    Albumin 2.2 (*) 3.5 - 5.2 g/dL    AST 27  0 - 37 U/L    ALT 23  0 - 53 U/L    Alkaline Phosphatase 115  39 - 117 U/L    Total Bilirubin 0.4  0.3 - 1.2 mg/dL    GFR calc non Af Amer 79 (*) >90 mL/min    GFR calc Af Amer >90  >90  mL/min   CBC WITH DIFFERENTIAL     Status: Abnormal   Collection Time   04/30/12  5:47 PM      Component Value Range Comment   WBC 13.6 (*) 4.0 - 10.5 K/uL    RBC 4.02 (*) 4.22 - 5.81 MIL/uL    Hemoglobin 10.1 (*) 13.0 - 17.0 g/dL    HCT 16.1 (*) 09.6 - 52.0 %    MCV 76.9 (*) 78.0 - 100.0 fL    MCH 25.1 (*) 26.0 - 34.0 pg    MCHC 32.7  30.0 - 36.0 g/dL    RDW 04.5 (*) 40.9 - 15.5 %    Platelets 427 (*) 150 - 400 K/uL    Neutrophils Relative 86 (*) 43 - 77 %    Neutro Abs 11.6 (*) 1.7 - 7.7 K/uL    Lymphocytes Relative 8 (*) 12 - 46 %    Lymphs Abs 1.1  0.7 - 4.0 K/uL    Monocytes Relative 6  3 - 12 %    Monocytes Absolute 0.8  0.1 - 1.0 K/uL    Eosinophils Relative 0  0 - 5 %    Eosinophils Absolute 0.0  0.0 - 0.7 K/uL    Basophils Relative 0  0 - 1 %    Basophils Absolute 0.0  0.0 - 0.1 K/uL   PROTIME-INR     Status: Abnormal   Collection Time   04/30/12  5:47 PM      Component Value Range Comment   Prothrombin Time 15.4 (*) 11.6 - 15.2 seconds    INR 1.24  0.00 - 1.49   LACTIC  ACID, PLASMA     Status: Abnormal   Collection Time   04/30/12  6:04 PM      Component Value Range Comment   Lactic Acid, Venous 3.4 (*) 0.5 - 2.2 mmol/L   TROPONIN I     Status: Normal   Collection Time   04/30/12  6:05 PM      Component Value Range Comment   Troponin I <0.30  <0.30 ng/mL   URINALYSIS, ROUTINE W REFLEX MICROSCOPIC     Status: Abnormal   Collection Time   04/30/12  7:43 PM      Component Value Range Comment   Color, Urine YELLOW  YELLOW    APPearance CLOUDY (*) CLEAR    Specific Gravity, Urine 1.021  1.005 - 1.030    pH 7.5  5.0 - 8.0    Glucose, UA 250 (*) NEGATIVE mg/dL    Hgb urine dipstick LARGE (*) NEGATIVE    Bilirubin Urine NEGATIVE  NEGATIVE    Ketones, ur NEGATIVE  NEGATIVE mg/dL    Protein, ur >811 (*) NEGATIVE mg/dL    Urobilinogen, UA 0.2  0.0 - 1.0 mg/dL    Nitrite NEGATIVE  NEGATIVE    Leukocytes, UA SMALL (*) NEGATIVE   URINE MICROSCOPIC-ADD ON     Status: Abnormal   Collection Time   04/30/12  7:43 PM      Component Value Range Comment   WBC, UA 3-6  <3 WBC/hpf    RBC / HPF 21-50  <3 RBC/hpf    Bacteria, UA MANY (*) RARE    Ct Abdomen Pelvis W Contrast  04/30/2012  *RADIOLOGY REPORT*  Clinical Data: Sepsis.  Abdominal pain and nausea.  Vomiting. Fever.  CT ABDOMEN AND PELVIS WITH CONTRAST  Technique:  Multidetector CT imaging of the abdomen and pelvis was performed following the standard protocol during bolus administration  of intravenous contrast.  Contrast: OMNIPAQUE IOHEXOL 300 MG/ML  SOLN  Comparison: CT of the abdomen and pelvis 04/13/2012.  Findings: Bilateral lower lobe airspace disease progressed, worrisome for pneumonia.  The heart size is normal.  Pacing wires are in place.  No significant pleural or pericardial effusion is present.  The liver and spleen are within normal limits.  The gastrojejunostomy tube is in place.  The tip is at the ligament of Treitz.  The pancreas is mildly atrophic but within normal limits. The common bile duct  is within normal limits following cholecystectomy.  Adrenal glands are normal bilaterally.  A 6 mm posterior nonobstructing stone is present in the left kidney. There are several sub-centimeter hypodense lesions bilaterally, likely representing simple cysts.  Slight ectasia of the abdominal aorta is noted.  There is no aneurysm.  A Foley catheter is present within the collapsed urinary bladder. Penile prosthesis is in place.  A colostomy and mucous fistula is present in the left lower quadrant.  The bowel is otherwise unremarkable.  No significant adenopathy or free fluid is present.  A lytic lesion in the left iliac bone, adjacent to the SI joint is slightly more prominent than on the prior exam.  IMPRESSION:  1.  No acute or focal abnormality to explain the patient's symptoms. 2.  Nonobstructing left renal stone is stable. 3.  Status post cholecystectomy. 4.  Satisfactory positioning of the gastrojejunostomy tube. 5.  Left lower quadrant colectomy without evidence for complication. 6.  Atherosclerosis. 7.  New onset of bilateral lower lobe airspace disease is concerning for pneumonia.   Original Report Authenticated By: Marin Roberts, M.D.    Dg Chest Portable 1 View  04/30/2012  *RADIOLOGY REPORT*  Clinical Data: Emesis, cough and wheezing.  PORTABLE CHEST - 1 VIEW  Comparison: CT chest 04/13/1936 chest radiograph 02/12/2012.  Findings: Trachea is midline.  Heart size stable.  Pacemaker lead tips project over the right atrium and right ventricle.  Lungs are somewhat low in volume with central pulmonary vascular congestion. Lingular subsegmental atelectasis.  No definite pleural fluid.  IMPRESSION: Central pulmonary vascular congestion without overt air space consolidation or edema.   Original Report Authenticated By: Leanna Battles, M.D.     Review of Systems  Constitutional: Negative.   HENT: Negative.   Eyes: Negative.   Respiratory: Negative.   Cardiovascular: Negative.   Gastrointestinal:  Positive for nausea and vomiting.  Genitourinary: Negative.   Musculoskeletal: Negative.   Skin: Negative.   Neurological: Negative.   Endo/Heme/Allergies: Negative.   Psychiatric/Behavioral: Negative.     Blood pressure 97/58, pulse 97, temperature 98.1 F (36.7 C), temperature source Oral, resp. rate 27, height 5' 10.08" (1.78 m), weight 95.4 kg (210 lb 5.1 oz), SpO2 94.00%. Physical Exam  Constitutional: He appears well-developed and well-nourished. No distress.  HENT:  Head: Normocephalic and atraumatic.  Eyes: Conjunctivae normal are normal. Pupils are equal, round, and reactive to light. Right eye exhibits no discharge. Left eye exhibits no discharge. No scleral icterus.  Neck: Normal range of motion. Neck supple.  Cardiovascular: Normal rate and regular rhythm.   Respiratory: Effort normal and breath sounds normal. No respiratory distress. He has no wheezes.  GI: Soft. Bowel sounds are normal.       Colostomy bag filled with yellow stools.  Musculoskeletal: He exhibits edema. He exhibits no tenderness.  Neurological:       Appears lethargic and confused. Has right sided hemiparesis.  Skin: He is not diaphoretic.  Stage 4 sacral decubitus ulcer.     Assessment/Plan #1. SIRS probably from pneumonia and UTI - patient possibly has aspiration pneumonia. Patient also during recent admission was treated for UTI. At this time blood cultures urine cultures have been ordered. Patient has been empirically started on vancomycin Zosyn and Levaquin. Check flu PCR. Continue with hydration and hold antihypertensives. Patient's sacral decubitus does not look infected at this time but we'll get wound consult for further recommendations. Patient's family has not noticed any diarrhea. #2. Nausea vomiting - patient vomited chocolate-colored vomitus. Patient's colostomy bag shows yellow colored stools. Patient had similar vomitus during last admission and was felt the color of the vomitus may be  from oropharyngeal bleed and at that time had ENT consult which showed no source for bleeding. At this time patient will be kept n.p.o. will recheck CBC stat and if there is any significant drop in hemoglobin then transfuse PRBC. Patient has been placed on protoniX infusion. Consult GI. #3. Sacral decubitus ulcer stage IV - consult wound team. #4. History of CVA with right-sided hemiparesis. #5. Anemia - follow CBC. See #2. #6. History of hypertension presently mildly hypotensive. #7. History of endocarditis - follow blood cultures. #8. Hypothyroidism - continue Synthroid. Check TSH. #9. Diabetes mellitus type 2 - patient is on sliding-scale coverage as patient is n.p.o.  CODE STATUS - full code.   Nesa Distel N. 05/01/2012, 12:17 AM

## 2012-05-01 NOTE — Progress Notes (Signed)
INITIAL NUTRITION ASSESSMENT  DOCUMENTATION CODES Per approved criteria  -Obesity Unspecified   INTERVENTION: 1. Once medically able to initiate enteral nutrition, recommend Glucerna 1.2 at 15 ml/hr via PEG, advance by 10 ml/hr every 4 hours, to goal rate of 85 ml/hr with 30 ml Prostat BID via PEG. Total goal rate and protein supplement provides 2648 kcals, 152 gm protein, 1642 ml of free water.   2. Once IVF discontinued, recommend free water flushes of 200 ml every 4 hours to provide an additional 1200 ml daily. SNF RD to adjust prn.  3. If pt to resume oral nutrition, recommend SLP evaluation if within GOC to determine safest diet texture and liquid consistency.  4. RD to continue to follow nutrition care plan  NUTRITION DIAGNOSIS: Inadequate oral intake related to hx of dysphagia as evidenced by use of PEG for main source of nutrition.  Goal: 1. Initiation of EN 2. EN to meet >/= 90% of estimated nutrition needs   Monitor:  Weight trends, TF initiation/ tolerance, lab trends, wound healing      Reason for Assessment: Malnutrition Screening Tool + Low Braden   77 y.o. male  Admitting Dx: SIRS (systemic inflammatory response syndrome)  ASSESSMENT: Pt admitted to hospital from SNF with fever, N/V.  Diagnosis of possible aspiration PNA and SIRS.  RD drawn to chart 2/2 Malnutrition Screening Tool. Per screening tool, pt reported that he had significant wt loss, however according to documented weight trends, pt's weight has been steady and displayed no recent weight loss.   Noted per nursing home medication report, pt is still receiving Vitamin C and Zinc Sulfate for wound healing.  At SNF, pt offered mech soft diet at meal times for comfort with known risk for aspiration. Per nursing home RD, po intake is extremely minimal. However, PEG tube medically necessary as pt unable to take adequate PO to meet needs.      Per nursing home records, pt receives 100% of daily kcal via PEG-   Diabetesource @ 150 ml/hr x 20 hours (3 pm to 11 am); H2O 15 mL before and after receiving meds., this tube feed order provides ~3600 kcal, 144 grams protein, 2454 mL free water.   Pt currently NPO. RD discussed initiation of nutrition support with NP, Junious Silk. She notes that pt is not ready to resume enteral nutrition at this time. Due to recent acute illness and unresolved stage 4 sacral pressure ulcer, pt is at nutrition risk.   Height: Ht Readings from Last 1 Encounters:  04/30/12 5' 10.08" (1.78 m)    Weight: Wt Readings from Last 1 Encounters:  04/30/12 210 lb 5.1 oz (95.4 kg)    Ideal Body Weight: 166 lbs (75.5 kg)  % Ideal Body Weight: 130%   Wt Readings from Last 10 Encounters:  04/30/12 210 lb 5.1 oz (95.4 kg)  04/17/12 210 lb 5.5 oz (95.412 kg)  02/12/12 210 lb 1.6 oz (95.3 kg)  06/18/11 219 lb (99.338 kg)  05/22/11 210 lb (95.255 kg)  02/20/11 219 lb 12.8 oz (99.7 kg)    Usual Body Weight: 210 lbs (95.4 kg)  % Usual Body Weight: 100%  BMI:  Body mass index is 30.11 kg/(m^2). Obese class 1  Estimated Nutritional Needs: Kcal: 2500 - 2800 kcal/day  Protein: 120 - 145 g daily  Fluid: 2.5 - 2. 8 L daily   Skin: Stage 4 sacral decubitus ulcer   Diet Order: NPO   EDUCATION NEEDS: -No education needs identified at this time  Intake/Output Summary (Last 24 hours) at 05/01/12 0933 Last data filed at 05/01/12 0736  Gross per 24 hour  Intake     60 ml  Output    800 ml  Net   -740 ml    Last BM: 05/01/2012  Labs:   Lab 04/30/12 1747  NA 137  K 4.6  CL 97  CO2 23  BUN 54*  CREATININE 0.97  CALCIUM 9.9  MG --  PHOS --  GLUCOSE 349*    CBG (last 3)   Basename 05/01/12 0741  GLUCAP 203*    Scheduled Meds:   . atropine  2 drop Both Eyes TID AC & HS  . Chlorhexidine Gluconate Cloth  6 each Topical Q0600  . insulin aspart  0-9 Units Subcutaneous TID WC  . levofloxacin (LEVAQUIN) IV  750 mg Intravenous Q24H  . levothyroxine  112 mcg  Per Tube QAC breakfast  . liothyronine  25 mcg Per Tube Daily  . mupirocin ointment  1 application Nasal BID  . piperacillin-tazobactam (ZOSYN)  IV  3.375 g Intravenous Q8H  . scopolamine  1 patch Transdermal Q72H  . sodium chloride  3 mL Intravenous Q12H  . thiamine  100 mg Per Tube Daily  . vancomycin  1,250 mg Intravenous Q12H  . zinc sulfate  220 mg Per Tube Daily    Continuous Infusions:   . sodium chloride 150 mL/hr (05/01/12 0700)  . pantoprozole (PROTONIX) infusion 8 mg/hr (05/01/12 1610)    Past Medical History  Diagnosis Date  . Stroke     Hx of with right hemiparesis  . Hypertension   . Endocarditis, mitral valve, syphilitic   . Hypothyroidism   . Anorexia   . Dysphagia   . Reflux   . Bacteremia   . Clostridium difficile colitis   . CVA (cerebral vascular accident)   . Pressure ulcer(707.0)   . Diabetes mellitus, type 2   . Anemia   . Stroke     Past Surgical History  Procedure Date  . Colostomy   . Peg placement   . Hernia repair    Belenda Cruise  Dietetic Intern Pager: [phone]  I have reviewed the above information and agree. Jarold Motto MS, RD, LDN Pager: [phone] After-hours pager: [phone]

## 2012-05-01 NOTE — Clinical Social Work Psychosocial (Addendum)
Clinical Social Work Department BRIEF PSYCHOSOCIAL ASSESSMENT 05/01/2012  [name], [name]     Account Number:  [id number]     Admit date:  04/30/2012  Clinical Social Worker:  Delmer Islam  Date/Time:  05/01/2012 05:24 PM  Referred by:  Physician  Date Referred:  05/01/2012 Referred for  SNF Placement   Other Referral:   Interview type:  Patient Other interview type:   Patient's wife: [name]    PSYCHOSOCIAL DATA Living Status:  FACILITY Admitted from facility:  [facility] Level of care:  Skilled Nursing Facility Primary support name:  [name] Primary support relationship to patient:  SPOUSE Degree of support available:   Patient's wife at bedside. Strong interested in patient's progress.    CURRENT CONCERNS Current Concerns  Post-Acute Placement   Other Concerns:    SOCIAL WORK ASSESSMENT / PLAN CSW intern introduced self to patient and patient's wife. Patient was alert but had little engagement in conversation with CSW intern. CSW intern spoke with the patient and wife about his return to [facility]. Wife advised CSW intern that patient is long term care at [facility] and confirmed that the patient will be returning.   Assessment/plan status:  Psychosocial Support/Ongoing Assessment of Needs Other assessment/ plan:   Information/referral to community resources:   No information needed or requested at this time.    PATIENTS/FAMILYS RESPONSE TO PLAN OF CARE: Patient and wife was appreciate of CSW intern visit and the assistance that will be provided at discharge.     Fernande Boyden, Social Work Intern 05/01/2012

## 2012-05-01 NOTE — Consult Note (Signed)
WOC consult Note Reason for Consult: sacral pressure ulcer. Pt known to this WOC nurse from previous admissions. Pt from SNF, has air mattress in place in SNF. Has been followed for some time per the wound care center, however since his frequent admissions and SNF placement, not sure he has been followed recently.  Wound type: Stage IV pressure ulcer Pressure Ulcer POA: Yes Measurement: 6cm x 5cm x 3.5cm  Wound NAT:FTDD tissue present with some new necrosis at the wound edges from 12-4 o'clock, early granulation present in a portion of the wound Drainage (amount, consistency, odor) heavy, serosanguinous, no odor  Periwound: large amount of periwound maceration and the dressing in place was heavily saturated Dressing procedure/placement/frequency: Hydrotherapy for mechanical debridement of the wound, saline gauze dressing daily after tx..  Air mattress for pressure redistribution and moisture management.  Notified PT of new orders.  Noted pt also has colostomy, will order correct supplies for the nurse use while here.  WOC will follow along with PT for wound management Hilda Wexler [facility] RN,CWOCN [phone]

## 2012-05-01 NOTE — Progress Notes (Signed)
PT HYDROTHERAPY EVALUATION NOTE    05/01/12 1300  Subjective Assessment  Subjective I feel better  Patient and Family Stated Goals clean up wound  Date of Onset (prior to admission)  Prior Treatments previous hydrotherapy on prior admissions; wound care center treatments  Evaluation and Treatment  Evaluation and Treatment Procedures Explained to Patient/Family Yes  Evaluation and Treatment Procedures agreed to  Wound 04/13/12 Other (Comment) Sacrum STAGE IV pressure ulcer POA  Date First Assessed/Time First Assessed: 04/13/12 1114   Wound Type: Other (Comment)  Location: (c) Sacrum  Wound Description (Comments): STAGE IV pressure ulcer POA  Present on Admission: Yes  Site / Wound Assessment Brown;Dusky;Granulation tissue;Pink;Red;Yellow  % Wound base Red or Granulating 50%  % Wound base Yellow 20%  % Wound base Black 30%  Wound Length (cm) 6 cm  Wound Width (cm) 5 cm  Wound Depth (cm) 3.5 cm  Margins Unattacted edges (unapproximated)  Closure None  Drainage Amount Moderate  Drainage Description Sanguineous  Non-staged Wound Description Partial thickness  Treatment Hydrotherapy (Pulse lavage);Packing (Saline gauze);Tape changed  Dressing Type Gauze (Comment)  Dressing Changed Changed  Dressing Status Clean;Intact (Foam dressing applied)  Hydrotherapy  Pulsed Lavage with Suction (psi) 4 psi  Pulsed Lavage with Suction - Normal Saline Used 1000 mL  Pulsed Lavage Tip Tip with splash shield  Pulsed lavage therapy - wound location sacral wound  Wound Therapy - Assess/Plan/Recommendations  Wound Therapy - Clinical Statement Pt is a 77 y.o. male with a stage IV sacral wound.  Patient has had prior treatments on previous admission for this wound.  Pt presents today with significant amounts of maceration and moderate amounts of necrotic tissue around the wound margins.  Wound bed does show some amounts of granulation tissue with small portions of filmy overlay.  Pt will benefit from  continuued hydrotherapy and selective debridement of necrotic tissue to facilitate wound healing. Rec 6x wk course of hydrotherapy.   Wound Therapy - Functional Problem List decreased skin integrity; decreased mobility  Factors Delaying/Impairing Wound Healing Altered sensation;Infection - systemic/local;Immobility;Vascular compromise;Multiple medical problems  Hydrotherapy Plan Debridement;Dressing change;Pulsatile lavage with suction  Wound Therapy - Frequency 6X / week  Wound Plan Debridement;Dressing change;Pulsatile lavage with suction  Wound Therapy Goals - Improve the function of patient's integumentary system by progressing the wound(s) through the phases of wound healing by:  Decrease Necrotic Tissue to <30%  Decrease Necrotic Tissue - Progress Goal set today  Increase Granulation Tissue to >70%  Increase Granulation Tissue - Progress Goal set today  Improve Drainage Characteristics Min  Improve Drainage Characteristics - Progress Goal set today  Goals/treatment plan/discharge plan were made with and agreed upon by patient/family Yes  Time For Goal Achievement 7 days  Wound Therapy - Potential for Goals Good     Charlotte Crumb, PT DPT  [phone]

## 2012-05-01 NOTE — Progress Notes (Signed)
[facility] Progress Note [place] TEAM 1 - Stepdown/ICU TEAM   RYDER MAN AOZ:[id number] DOB: 1935-09-21 DOA: 04/30/2012 PCP: Default, Provider, MD  Brief narrative: 77 year old male patient resident of the nursing facility who is nonambulatory status post prior stroke with residual right-sided hemiparesis. Recently admitted to  2 weeks ago for question of GI bleed. Back to the hospital because of persistent nausea and vomiting with apparent hematemesis. The vomiting has been intermittent over a period of 2 days. On the morning of admission patient had more frequent episodes. The family was also concerned of the patient's lethargy that in the past has been consistent with some sort of infectious process. In the emergency department the patient was febrile with a temperature of greater than 102. A chest x-ray revealed bilateral infiltrates concerning for pneumonia. CT abdomen and pelvis did not showing acute abdominal process but did demonstrate features in the lungs concerning for pneumonia. Urinalysis was consistent with possible urinary tract infection. Patient appeared to be dehydrated and hypo-perfusing with a lactic acid level greater than 3 and a low systolic blood pressure. He was subsequently given 3 L of normal saline and blood pressure was 100 systolic at that time the admitting physician evaluated him. He was subsequently admitted to the step down unit.  Assessment/Plan: Principal Problem:  *SIRS (systemic inflammatory response syndrome) -Continue supportive care with broad-spectrum antibiotics and narrow based on culture finding -Currently hemodynamically stable, continue supportive hydration  Active Problems:  Lactic acidosis/Dehydration -Followup lactic acid in a.m. -Renal function is normal except for elevated BUN -Likely exacerbated by diarrhea from colostomy site continue IV fluids  Acute respiratory failure with hypoxia due to : A) ? Aspiration  pneumonia B) HCAP (healthcare-associated pneumonia) -Likely primary etiology to respiratory failure is aspiration pneumonia in setting of elderly patient with lethargy and recurrent nausea and vomiting -Continue broad-spectrum antibiotic coverage for aspiration as well as healthcare acquired pneumonia since patient recently hospitalized 2 weeks ago and is a resident of a skilled nursing facility -Continue supportive care with oxygen and pulmonary toileting   Clostridium difficile colitis -Colostomy output positive for C. Difficile -Initiate IV Flagyl and narrow antibiotics for above-stated conditions as soon as possible -Likely will need at least 10-14 days of treatment duration after antibiotics completed   ? UTI (lower urinary tract infection) -Urinalysis abnormal and could be indicative of UTI but also been related to volume depletion - urine culture negative   Hematemesis/Nausea & vomiting/Chronic anemia -Unclear if she really having GI bleeding symptoms especially given colostomy output is yellow and not black or brown -Will check colostomy output for fecal occult blood -Will hold tube feedings for an additional 24 hours then slowly began at nutritionist's recommendation -Continue to monitor, may need gastroenterology evaluation this admission -Currently on Protonix infusion given diarrhea and C. difficile colitis consider discontinuation of this medication and monitor for recurrent bleeding symptoms -Hemoglobin stable at baseline between 8.5 and 9   Fever/Leukocytosis -White count has normalized after admission with initiation of empiric antibiotic coverage and adequate rehydration -Continue to follow laboratory trends   Metabolic encephalopathy -Due to acute infection as well as hypoperfusion from sirs and dehydration -Continue to follow   Dysphagia due to old stroke -Has chronic gastrojejunostomy tube-enteric feedings to be resumed as noted above   Hypothyroidism -Continue  Synthroid per tube   DM (diabetes mellitus), secondary, uncontrolled, with peripheral vascular complications -CBGs greater than 200 -Continue sliding scale insulin -Was on Lantus 25 units at hour of sleep-consider resuming  at least half dose at CBGs remain elevated   Hypertension -Blood pressure soft in setting of SIRS -Hold home beta blocker but watch for rebound tachycardia   Sacral decubitus ulcer, stage IV -Appreciate wound ostomy care nurse assistance -Current wound measurements: 6 cm x 5 cm x 3.5 cm--apparent new necrosis of wound edges is associated large amount of periwound maceration -Recommendations are PT for hydrotherapy for mechanical debridement of the wound with saline dressings and air mattress for pressure redistribution and moisture management-wound care nurse was notified PT   DVT prophylaxis: SCDs Code Status: Full Family Communication: Spoke to patient's wife who was at the bedside Disposition Plan: Remain in step down  Consultants: None  Procedures: None  Antibiotics: Vancomycin 1/16 >>> Zosyn 1/16 >>> Levaquin 1/16 >>> Flagyl 1/16 >>>  HPI/Subjective: Patient is lethargic and unable to participate in bedside exam or history. History obtained from wife. Currently patient appears comfortable and wife states she feels he looks better than he did prior to admission.   Objective: Blood pressure 107/56, pulse 86, temperature 98.8 F (37.1 C), temperature source Axillary, resp. rate 15, height 5' 10.08" (1.78 m), weight 95.4 kg (210 lb 5.1 oz), SpO2 100.00%.  Intake/Output Summary (Last 24 hours) at 05/01/12 1339 Last data filed at 05/01/12 1125  Gross per 24 hour  Intake     60 ml  Output   1000 ml  Net   -940 ml     Exam: General: No acute respiratory distress but is lethargic and chronically ill-appearing Lungs: Clear but coarse to auscultation bilaterally without wheezes or crackles, 4 L nasal cannula oxygen Cardiovascular: Regular rate and  rhythm without murmur gallop or rub normal S1 and S2, no JVD, no peripheral edema Abdomen: Nontender, nondistended, soft, bowel sounds positive, no rebound, no ascites, no appreciable mass; gastrojejunostomy tube currently clamped, colostomy bag intact stoma peak and non-retracted-yellow light brown mucoid returns noted Musculoskeletal: No significant cyanosis, clubbing of bilateral lower extremities Neurological: Patient is lethargic and unresponsive, unable to ascertain strength or movement  Data Reviewed: Basic Metabolic Panel:  Lab 04/30/12 9604  NA 137  K 4.6  CL 97  CO2 23  GLUCOSE 349*  BUN 54*  CREATININE 0.97  CALCIUM 9.9  MG --  PHOS --   Liver Function Tests:  Lab 04/30/12 1747  AST 27  ALT 23  ALKPHOS 115  BILITOT 0.4  PROT 9.4*  ALBUMIN 2.2*   No results found for this basename: LIPASE:5,AMYLASE:5 in the last 168 hours No results found for this basename: AMMONIA:5 in the last 168 hours CBC:  Lab 05/01/12 0645 04/30/12 1747  WBC 8.4 13.6*  NEUTROABS -- 11.6*  HGB 8.5* 10.1*  HCT 26.9* 30.9*  MCV 76.2* 76.9*  PLT 344 427*   Cardiac Enzymes:  Lab 04/30/12 1805  CKTOTAL --  CKMB --  CKMBINDEX --  TROPONINI <0.30   BNP (last 3 results) No results found for this basename: PROBNP:3 in the last 8760 hours CBG:  Lab 05/01/12 0741  GLUCAP 203*    Recent Results (from the past 240 hour(s))  CLOSTRIDIUM DIFFICILE BY PCR     Status: Abnormal   Collection Time   04/30/12  6:27 PM      Component Value Range Status Comment   C difficile by pcr POSITIVE (*) NEGATIVE Final   MRSA PCR SCREENING     Status: Abnormal   Collection Time   05/01/12  3:39 AM      Component Value Range Status Comment  MRSA by PCR POSITIVE (*) NEGATIVE Final      Studies:  Recent x-ray studies have been reviewed in detail by the Attending Physician  Scheduled Meds:  Reviewed in detail by the Attending Physician   Junious Silk, ANP [facility] Office   [phone] Pager [phone]  On-Call/Text Page:      [email or web address]      password [email or web address]  If 7PM-7AM, please contact night-coverage [email or web address] Password [email or web address] 05/01/2012, 1:39 PM   LOS: 1 day   I have examined the patient and reviewed the chart. I agree with the above note which I have modified.   Calvert Cantor, MD [phone]

## 2012-05-01 NOTE — Progress Notes (Addendum)
ANTIBIOTIC CONSULT NOTE - INITIAL  Pharmacy Consult for Vancomycin, Zosyn, Levaquin Indication: pneumonia  No Known Allergies  Patient Measurements: Height: 5' 10.08" (178 cm) Weight: 210 lb 5.1 oz (95.4 kg) IBW/kg (Calculated) : 73.18   Labs:  Basename 04/30/12 1747  WBC 13.6*  HGB 10.1*  PLT 427*  LABCREA --  CREATININE 0.97   Medical History: Past Medical History  Diagnosis Date  . Stroke     Hx of with right hemiparesis  . Hypertension   . Endocarditis, mitral valve, syphilitic   . Hypothyroidism   . Anorexia   . Dysphagia   . Reflux   . Bacteremia   . Clostridium difficile colitis   . CVA (cerebral vascular accident)   . Pressure ulcer(707.0)   . Diabetes mellitus, type 2   . Anemia   . Stroke     Assessment: 77 year old male with a history of stroke with right-sided hemiparesis who has been been bed bound.  He was recently admitted to the ED 2 weeks ago for possible GI bleed.  Family reports episodes of nausea and vomiting chocolate brown vomitus.  Found to be lethargic and febrile in ED.  Chest xray concerning for pneumonia (?aspiration pneumonia)  Pharmacy asked to begin IV antibiotics for pneumonia.  Placed on Protonix infusion for ? GI bleed (GI consulted).   Renally stable.  Received doses of Zosyn and Vancomycin last PM  Goal of Therapy:  Vancomycin trough level 15-20 mcg/ml Appropriate Levaquin / Zosyn dosing  Plan:  1) Levaquin 750 mg IV Q 24 hours 2) Vancomycin 1250 mg IV Q 12 hours 3) Zosyn 3.375 grams IV Q 8 hours (4 hr infusion) 4) Follow up progress, renal function, cultures  Thank you. Okey Regal, PharmD [phone]  05/01/2012,6:51 AM

## 2012-05-02 DIAGNOSIS — A419 Sepsis, unspecified organism: Principal | ICD-10-CM

## 2012-05-02 DIAGNOSIS — E860 Dehydration: Secondary | ICD-10-CM

## 2012-05-02 DIAGNOSIS — A0472 Enterocolitis due to Clostridium difficile, not specified as recurrent: Secondary | ICD-10-CM

## 2012-05-02 DIAGNOSIS — J9600 Acute respiratory failure, unspecified whether with hypoxia or hypercapnia: Secondary | ICD-10-CM

## 2012-05-02 LAB — GLUCOSE, CAPILLARY
Glucose-Capillary: 99 mg/dL (ref 70–99)
Glucose-Capillary: 107 mg/dL — ABNORMAL HIGH (ref 70–99)
Glucose-Capillary: 100 mg/dL — ABNORMAL HIGH (ref 70–99)
Glucose-Capillary: 85 mg/dL (ref 70–99)
Glucose-Capillary: 120 mg/dL — ABNORMAL HIGH (ref 70–99)

## 2012-05-02 LAB — COMPREHENSIVE METABOLIC PANEL
ALT: 19 U/L (ref 0–53)
AST: 27 U/L (ref 0–37)
Alkaline Phosphatase: 95 U/L (ref 39–117)
CO2: 21 mEq/L (ref 19–32)
Calcium: 8.6 mg/dL (ref 8.4–10.5)
GFR calc Af Amer: >90 mL/min (ref >90)
Glucose, Bld: 98 mg/dL (ref 70–99)
Potassium: 3.5 mEq/L (ref 3.5–5.1)
Sodium: 144 mEq/L (ref 135–145)
Total Protein: 7.5 g/dL (ref 6.0–8.3)

## 2012-05-02 LAB — URINE CULTURE

## 2012-05-02 LAB — CBC WITH DIFFERENTIAL/PLATELET
Eosinophils Absolute: 0.2 10*3/uL (ref 0.0–0.7)
Eosinophils Relative: 5 % (ref 0–5)
HCT: 24.7 % — ABNORMAL LOW (ref 39.0–52.0)
Lymphocytes Relative: 17 % (ref 12–46)
Lymphs Abs: 0.8 10*3/uL (ref 0.7–4.0)
MCH: 24.2 pg — ABNORMAL LOW (ref 26.0–34.0)
MCV: 77.7 fL — ABNORMAL LOW (ref 78.0–100.0)
Monocytes Absolute: 0.6 10*3/uL (ref 0.1–1.0)
Monocytes Relative: 13 % — ABNORMAL HIGH (ref 3–12)
Platelets: 291 10*3/uL (ref 150–400)
RBC: 3.18 MIL/uL — ABNORMAL LOW (ref 4.22–5.81)
WBC: 4.3 10*3/uL (ref 4.0–10.5)

## 2012-05-02 MED ORDER — GLUCERNA SHAKE PO LIQD
237.0000 mL | Freq: Three times a day (TID) | ORAL | Status: DC
  Administered 2012-05-02 – 2012-05-03 (×3): 237 mL via ORAL

## 2012-05-02 MED ORDER — VANCOMYCIN HCL 10 G IV SOLR
1250.0000 mg | Freq: Two times a day (BID) | INTRAVENOUS | Status: DC
  Administered 2012-05-02 – 2012-05-03 (×2): 1250 mg via INTRAVENOUS
  Filled 2012-05-02 (×3): qty 1250

## 2012-05-02 MED ORDER — SODIUM CHLORIDE 0.9 % IV SOLN
500.0000 mg | Freq: Three times a day (TID) | INTRAVENOUS | Status: DC
  Administered 2012-05-02 – 2012-05-05 (×9): 500 mg via INTRAVENOUS
  Filled 2012-05-02 (×13): qty 500

## 2012-05-02 MED ORDER — FAMOTIDINE 40 MG/5ML PO SUSR
20.0000 mg | Freq: Two times a day (BID) | ORAL | Status: DC
  Administered 2012-05-02 – 2012-05-05 (×7): 20 mg
  Filled 2012-05-02 (×9): qty 2.5

## 2012-05-02 NOTE — Progress Notes (Signed)
PT HYDROTHERAPY PROGRESS NOTE  05/02/12 1000  Subjective Assessment  Subjective I just want a cup of water  Patient and Family Stated Goals clean up wound  Date of Onset (prior to admission)  Prior Treatments previous hydrotherapy on prior admissions; wound care center treatments  Evaluation and Treatment  Evaluation and Treatment Procedures Explained to Patient/Family Yes  Evaluation and Treatment Procedures agreed to  Wound 04/13/12 Other (Comment) Sacrum STAGE IV pressure ulcer POA  Date First Assessed/Time First Assessed: 04/13/12 1114   Wound Type: Other (Comment)  Location: (c) Sacrum  Wound Description (Comments): STAGE IV pressure ulcer POA  Present on Admission: Yes  Site / Wound Assessment Brown;Dusky;Granulation tissue;Pink;Red;Yellow  % Wound base Red or Granulating 50%  % Wound base Yellow 20%  % Wound base Black 30%  Margins Unattacted edges (unapproximated)  Closure None  Drainage Amount Moderate  Drainage Description Sanguineous  Non-staged Wound Description Partial thickness  Treatment Debridement (Selective);Hydrotherapy (Pulse lavage);Packing (Saline gauze);Tape changed;Cleansed  Dressing Type Gauze (Comment)  Dressing Changed Changed  Dressing Status Clean;Intact  Hydrotherapy  Pulsed Lavage with Suction (psi) 4 psi  Pulsed Lavage with Suction - Normal Saline Used 1000 mL  Pulsed Lavage Tip Tip with splash shield  Pulsed lavage therapy - wound location sacral wound  Selective Debridement  Selective Debridement - Location sacrum  Selective Debridement - Tools Used Forceps;Scissors  Selective Debridement - Tissue Removed Necrotic tissue  Wound Therapy - Assess/Plan/Recommendations  Wound Therapy - Clinical Statement Pt received with modeerate drainage from wound bed and urine saturation secondary to leaking foley at insertion site. Skin around the wound has large amounts of maceration and moderate amounts of necrotic tissue around the wound margins.  Wound bed  does show some increased amounts of granulation tissue with small portions today.  Will continue to treat as indicated.  Wound Therapy - Functional Problem List decreased skin integrity; decreased mobility  Factors Delaying/Impairing Wound Healing Altered sensation;Infection - systemic/local;Immobility;Vascular compromise;Multiple medical problems  Hydrotherapy Plan Debridement;Dressing change;Pulsatile lavage with suction  Wound Therapy - Frequency 6X / week  Wound Plan Debridement;Dressing change;Pulsatile lavage with suction  Wound Therapy Goals - Improve the function of patient's integumentary system by progressing the wound(s) through the phases of wound healing by:  Decrease Necrotic Tissue to <30%  Decrease Necrotic Tissue - Progress Progressing toward goal  Increase Granulation Tissue to >70%  Increase Granulation Tissue - Progress Progressing toward goal  Improve Drainage Characteristics Min  Improve Drainage Characteristics - Progress Progressing toward goal  Goals/treatment plan/discharge plan were made with and agreed upon by patient/family Yes  Time For Goal Achievement 7 days  Wound Therapy - Potential for Goals Good     Charlotte Crumb, PT DPT  [phone]

## 2012-05-02 NOTE — Progress Notes (Signed)
[facility] Progress Note [place] TEAM 1 - Stepdown/ICU TEAM   FRANCOIS ELK ZOX:[id number] DOB: 10-06-1935 DOA: 04/30/2012 PCP: Default, Provider, MD  Brief narrative: 77 year old male patient resident of the nursing facility who is nonambulatory status post prior stroke with residual right-sided hemiparesis. Recently admitted to [facility] 2 weeks ago for question of GI bleed. Back to the hospital because of persistent nausea and vomiting with apparent hematemesis. The vomiting has been intermittent over a period of 2 days. On the morning of admission patient had more frequent episodes. The family was also concerned of the patient's lethargy that in the past has been consistent with some sort of infectious process. In the emergency department the patient was febrile with a temperature of greater than 102. A chest x-ray revealed bilateral infiltrates concerning for pneumonia. CT abdomen and pelvis did not showing acute abdominal process but did demonstrate features in the lungs concerning for pneumonia. Urinalysis was consistent with possible urinary tract infection. Patient appeared to be dehydrated and hypo-perfusing with a lactic acid level greater than 3 and a low systolic blood pressure. He was subsequently given 3 L of normal saline and blood pressure was 100 systolic at that time the admitting physician evaluated him. He was subsequently admitted to the step down unit.  Assessment/Plan: Principal Problem:  *SIRS (systemic inflammatory response syndrome) -Continue supportive care with broad-spectrum antibiotics and narrow based on culture finding -Currently hemodynamically stable, continue supportive hydration  Active Problems:  Lactic acidosis/Dehydration -Followup lactic acid in a.m. -Renal function is normal except for elevated BUN -Likely exacerbated by diarrhea from colostomy site continue IV fluids  Acute respiratory failure with hypoxia due to : A) ? Aspiration  pneumonia B) HCAP (healthcare-associated pneumonia) -Likely primary etiology to respiratory failure is aspiration pneumonia in setting of elderly patient with lethargy and recurrent nausea and vomiting -Will narrow broad-spectrum antibiotic coverage for aspiration pneumonia to Primaxin (see below) This will also cover healthcare acquired pneumonia   -Will continue vancomycin to cover for possible MRSA pneumonia since nasal swab was positive -Continue supportive care with oxygen and pulmonary toileting   Clostridium difficile colitis -Colostomy output positive for C. Difficile -Initiate IV Flagyl and narrow antibiotics for above-stated conditions as soon as possible -Likely will need at least 10-14 days of treatment duration after antibiotics completed   UTI (lower urinary tract infection)/Klebsiella -Urinalysis abnormal and could be indicative of UTI but also been related to volume depletion -Resistant to ampicillin, cefazolin, Cipro, Bactrim and Rocephin- sensitive to Zosyn and Imipenem - will dc Levaquin in favor of Primaxin   Hematemesis/Nausea & vomiting/Chronic anemia -Unclear if she really having GI bleeding symptoms especially given colostomy output is yellow and not black or brown -Continue to monitor, may need gastroenterology evaluation this admission -DC Protonix infusion in favor of H2 blockers in setting of C. difficile colitis and no active GI bleeding seen. -Dissipate would have heme positive stools simply because of acute colitis -Hemoglobin stable at baseline between 8.5 and 9   Fever/Leukocytosis -White count has normalized after admission with initiation of empiric antibiotic coverage and adequate rehydration -Continue to follow laboratory trends   Metabolic encephalopathy -Resolving, patient appears to be at baseline today -Due to acute infection as well as hypoperfusion from sirs and dehydration -Continue to follow   Dysphagia due to old stroke -Has chronic  gastrojejunostomy tube -Tube feeding recommendation is for nutrition as follows: Glucerna 1.2 at 15 ml/hr via PEG, advance by 10 ml/hr every 4 hours, to goal rate  of 85 ml/hr with 30 ml Prostat BID via PEG. Total goal rate and protein supplement provides 2648 kcals, 152 gm protein, 1642 ml of free water.  -If able to allow oral diet would like to see if patient would eat and then supplement diet with Glucerna 1 can 4 times a day per tube. If oral intake extremely poor then would change to continuous tube feedings -All asked speech therapy to evaluate today   Hypothyroidism -Continue Synthroid per tube -TSH 16.158 -Already on high-dose Synthroid prior to admission so we'll check free T4 and T3 to determine if dose needs to be adjusted upward   DM (diabetes mellitus), secondary, uncontrolled, with peripheral vascular complications -CBGs greater than 200 -Continue sliding scale insulin -Was on Lantus 25 units at hour of sleep-consider resuming at least half dose at CBGs remain elevated   Hypertension -Blood pressure soft in setting of SIRS -Hold home beta blocker but watch for rebound tachycardia   Sacral decubitus ulcer, stage IV -Appreciate wound ostomy care nurse assistance -Current wound measurements: 6 cm x 5 cm x 3.5 cm--apparent new necrosis of wound edges is associated large amount of periwound maceration -Recommendations are PT for hydrotherapy for mechanical debridement of the wound with saline dressings and air mattress for pressure redistribution and moisture management-wound care nurse was notified PT   DVT prophylaxis: SCDs Code Status: Full Family Communication: Spoke to patient's wife who was at the bedside Disposition Plan: Remain in step down  Consultants: None  Procedures: None  Antibiotics: Vancomycin 1/16 >>> 1/17 Zosyn 1/16 >>> 1/17 Levaquin 1/16 >>> 1/17 Flagyl 1/16 >>> Primaxin 1/17 >>>  HPI/Subjective: Patient much more awake today and is verbally  communicating. States he is thirsty and would like something to drink. Wife is at the bedside.   Objective: Blood pressure 113/60, pulse 75, temperature 97.7 F (36.5 C), temperature source Oral, resp. rate 20, height 5' 10.08" (1.78 m), weight 95.4 kg (210 lb 5.1 oz), SpO2 100.00%.  Intake/Output Summary (Last 24 hours) at 05/02/12 1343 Last data filed at 05/02/12 1300  Gross per 24 hour  Intake 4835.42 ml  Output   1700 ml  Net 3135.42 ml     Exam: General: No acute respiratory distress  Lungs: Bibasilar crackles but otherwise mostly clear, 3 L nasal cannula oxygen Cardiovascular: Regular rate and rhythm without murmur gallop or rub normal S1 and S2, no JVD, no peripheral edema Abdomen: Nontender, nondistended, soft, bowel sounds positive, no rebound, no ascites, no appreciable mass; gastrojejunostomy tube currently clamped, colostomy bag intact stoma peak and non-retracted-yellow light brown mucoid returns noted Musculoskeletal: No significant cyanosis, clubbing of bilateral lower extremities Neurological: Awake and oriented to name, seems to be aware he is in a hospital, able to move all extremities x4 but is very weak, exam nonfocal for any acute findings  Data Reviewed: Basic Metabolic Panel:  Lab 05/02/12 1610 04/30/12 1747  NA 144 137  K 3.5 4.6  CL 111 97  CO2 21 23  GLUCOSE 98 349*  BUN 31* 54*  CREATININE 0.99 0.97  CALCIUM 8.6 9.9  MG -- --  PHOS -- --   Liver Function Tests:  Lab 05/02/12 0455 04/30/12 1747  AST 27 27  ALT 19 23  ALKPHOS 95 115  BILITOT 0.3 0.4  PROT 7.5 9.4*  ALBUMIN 1.9* 2.2*   No results found for this basename: LIPASE:5,AMYLASE:5 in the last 168 hours No results found for this basename: AMMONIA:5 in the last 168 hours CBC:  Lab  05/02/12 0455 05/01/12 0645 04/30/12 1747  WBC 4.3 8.4 13.6*  NEUTROABS 2.8 -- 11.6*  HGB 7.7* 8.5* 10.1*  HCT 24.7* 26.9* 30.9*  MCV 77.7* 76.2* 76.9*  PLT 291 344 427*   Cardiac Enzymes:  Lab  04/30/12 1805  CKTOTAL --  CKMB --  CKMBINDEX --  TROPONINI <0.30   BNP (last 3 results) No results found for this basename: PROBNP:3 in the last 8760 hours CBG:  Lab 05/02/12 0959 05/02/12 0423 05/02/12 0012 05/01/12 1932 05/01/12 1642  GLUCAP 107* 99 99 88 94    Recent Results (from the past 240 hour(s))  CULTURE, BLOOD (ROUTINE X 2)     Status: Normal (Preliminary result)   Collection Time   04/30/12  5:50 PM      Component Value Range Status Comment   Specimen Description BLOOD RIGHT HAND   Final    Special Requests BOTTLES DRAWN AEROBIC AND ANAEROBIC 10CC   Final    Culture  Setup Time 05/01/2012 00:21   Final    Culture     Final    Value:        BLOOD CULTURE RECEIVED NO GROWTH TO DATE CULTURE WILL BE HELD FOR 5 DAYS BEFORE ISSUING A FINAL NEGATIVE REPORT   Report Status PENDING   Incomplete   CULTURE, BLOOD (ROUTINE X 2)     Status: Normal (Preliminary result)   Collection Time   04/30/12  6:00 PM      Component Value Range Status Comment   Specimen Description BLOOD LEFT HAND   Final    Special Requests BOTTLES DRAWN AEROBIC AND ANAEROBIC 10CC   Final    Culture  Setup Time 05/01/2012 00:21   Final    Culture     Final    Value:        BLOOD CULTURE RECEIVED NO GROWTH TO DATE CULTURE WILL BE HELD FOR 5 DAYS BEFORE ISSUING A FINAL NEGATIVE REPORT   Report Status PENDING   Incomplete   CLOSTRIDIUM DIFFICILE BY PCR     Status: Abnormal   Collection Time   04/30/12  6:27 PM      Component Value Range Status Comment   C difficile by pcr POSITIVE (*) NEGATIVE Final   URINE CULTURE     Status: Normal   Collection Time   04/30/12  7:44 PM      Component Value Range Status Comment   Specimen Description URINE, CATHETERIZED   Final    Special Requests NONE   Final    Culture  Setup Time 04/30/2012 20:52   Final    Colony Count >=100,000 COLONIES/ML   Final    Culture     Final    Value: KLEBSIELLA PNEUMONIAE     Note: Confirmed Extended Spectrum Beta-Lactamase Producer  (ESBL)   Report Status 05/02/2012 FINAL   Final    Organism ID, Bacteria KLEBSIELLA PNEUMONIAE   Final   MRSA PCR SCREENING     Status: Abnormal   Collection Time   05/01/12  3:39 AM      Component Value Range Status Comment   MRSA by PCR POSITIVE (*) NEGATIVE Final      Studies:  Recent x-ray studies have been reviewed in detail by the Attending Physician  Scheduled Meds:  Reviewed in detail by the Attending Physician   Junious Silk, ANP [facility] Office  [phone] Pager [phone]  On-Call/Text Page:      [email or web address]      password [facility]  If 7PM-7AM, please contact night-coverage [email or web address] Password [facility] 05/02/2012, 1:43 PM   LOS: 2 days   I have examined the patient, reviewed the chart and modified the above note which I agree with.  Calvert Cantor, MD [phone]

## 2012-05-02 NOTE — Progress Notes (Signed)
ANTIBIOTIC CONSULT NOTE  Pharmacy Consult for Vancomycin, Zosyn, Levaquin Indication: pneumonia, Klebsiella pneumoniae ESBL UTI  No Known Allergies  Patient Measurements: Height: 5' 10.08" (178 cm) Weight: 210 lb 5.1 oz (95.4 kg) IBW/kg (Calculated) : 73.18   Labs:  Basename 05/02/12 0455 05/01/12 0645 04/30/12 1747  WBC 4.3 8.4 13.6*  HGB 7.7* 8.5* 10.1*  PLT 291 344 427*  LABCREA -- -- --  CREATININE 0.99 -- 0.97    Assessment: 77 year old male with a history of stroke with right-sided hemiparesis who has been been bed bound.  He was recently admitted to the ED 2 weeks ago for possible GI bleed.  Family reports episodes of nausea and vomiting chocolate brown vomitus.  Found to be lethargic and febrile in ED.  Chest xray concerning for pneumonia (?aspiration pneumonia)  Pharmacy asked to begin IV antibiotics for pneumonia.  Placed on Protonix infusion for ? GI bleed (GI consulted).   Renally stable.  ESBL Klebsiella pneumoniae isolated from urine  Goal of Therapy:  Vancomycin trough level 15-20 mcg/ml   Plan:  1) Imipenem 500mg  IV q8h 2) Vancomycin 1250 mg IV Q 12 hours 3) Follow up progress, renal function, cultures 4) Vancomycin trough in AM  Celedonio Miyamoto, PharmD, BCPS Clinical Pharmacist Pager [phone]   05/02/2012,2:20 PM

## 2012-05-02 NOTE — ED Provider Notes (Signed)
I have personally seen and examined the patient.  I have discussed the plan of care with the resident.  I have reviewed the documentation on PMH/FH/Soc. History.  I have reviewed the documentation of the resident and agree.  I have reviewed and agree with the ECG interpretation(s) documented by the resident.  Pt ill appearing but improved in the ER.  No acute findings per CT abd/pelvis.  Pneumonia noted.    Joya Gaskins, MD 05/02/12 1146

## 2012-05-02 NOTE — Evaluation (Signed)
Clinical/Bedside Swallow Evaluation Patient Details  Name: [name] MRN: [id number] Date of Birth: [birth year]  Today's Date: 05/02/2012 Time: 4098-1191 SLP Time Calculation (min): 13 min  Past Medical History:  Past Medical History  Diagnosis Date  . Stroke     Hx of with right hemiparesis  . Hypertension   . Endocarditis, mitral valve, syphilitic   . Hypothyroidism   . Anorexia   . Dysphagia   . Reflux   . Bacteremia   . Clostridium difficile colitis   . CVA (cerebral vascular accident)   . Pressure ulcer(707.0)   . Diabetes mellitus, type 2   . Anemia   . Stroke    Past Surgical History:  Past Surgical History  Procedure Date  . Colostomy   . Peg placement   . Hernia repair    HPI:  Pt is an 77 year old male with a history of stroke with right-sided hemiparesis who has been been bed bound. He was recently admitted to the ED 2 weeks ago for possible GI bleed. Family reports episodes of nausea and vomiting chocolate brown vomitus. Found to be lethargic and febrile in ED. Chest xray concerning for pneumonia (?aspiration pneumonia). History of dysphagia includes Sensorimotor deficits from CVA as well as obstructive dysphagia from cervical fusion per 01/23/11 MBS. At that time pts altered mentation further complicated function, d/cd from hospital NPO with J tube for feeding. Med record in chart from [facility] states "Comfort feeding, Family and pt aware of risk of aspiration, pt must be suervised with feeds. Dys 2 diet." During last admission (2 weeks ago) this SLP evaluated pt who continued to demosntrate signs of aspriation. Pts family verbalized that pt should continue PO diet for comfort with known risk.    Assessment / Plan / Recommendation Clinical Impression  Pt known to this SLP from previous admission. History of dysphagia with pt/family accepting risk of aspiration, verbalized to this SLP last admission. Today pt actually appears to have slightly better function  than two weeks ago. Pt with multiple swallows, delayed dry cough response, but no wet vocal quality or severe weakness as seen prior. Pt has been on a dys 2 diet at SNF, but reports he eats very little and has had tube feeding to supplement. Recommend pt initiate a dys 2 diet (fine chop) with thin liquids. Will defer writing diet to NP, reported results to her. Moderate aspiration risk given history. SLP will f/u x1 to check for tolerance of solid consistency and educate family regarding function as no one was present during assessment.     Aspiration Risk  Moderate    Diet Recommendation Dysphagia 2 (Fine chop);Thin liquid   Liquid Administration via: Straw Medication Administration: Whole meds with puree Supervision: Patient able to self feed;Full supervision/cueing for compensatory strategies Compensations: Slow rate;Small sips/bites;Multiple dry swallows after each bite/sip;Clear throat intermittently Postural Changes and/or Swallow Maneuvers: Seated upright 90 degrees    Other  Recommendations Oral Care Recommendations: Oral care BID   Follow Up Recommendations  Skilled Nursing facility    Frequency and Duration min 1 x/week  1 week   Pertinent Vitals/Pain NA    SLP Swallow Goals Patient will utilize recommended strategies during swallow to increase swallowing safety with: Minimal cueing   Swallow Study Prior Functional Status       General HPI: Pt is an 77 year old male with a history of stroke with right-sided hemiparesis who has been been bed bound. He was recently admitted to the  ED 2 weeks ago for possible GI bleed. Family reports episodes of nausea and vomiting chocolate brown vomitus. Found to be lethargic and febrile in ED. Chest xray concerning for pneumonia (?aspiration pneumonia). History of dysphagia includes Sensorimotor deficits from CVA as well as obstructive dysphagia from cervical fusion per 01/23/11 MBS. At that time pts altered mentation further complicated  function, d/cd from hospital NPO with J tube for feeding. Med record in chart from [facility] states "Comfort feeding, Family and pt aware of risk of aspiration, pt must be suervised with feeds. Dys 2 diet." During last admission (2 weeks ago) this SLP evaluated pt who continued to demosntrate signs of aspriation. Pts family verbalized that pt should continue PO diet for comfort with known risk.  Type of Study: Bedside swallow evaluation Previous Swallow Assessment: MBS see, HPI Diet Prior to this Study: NPO Temperature Spikes Noted: No Respiratory Status: Room air History of Recent Intubation: No Behavior/Cognition: Alert;Cooperative Oral Cavity - Dentition: Adequate natural dentition Self-Feeding Abilities: Able to feed self Patient Positioning: Upright in bed Baseline Vocal Quality: Clear Volitional Cough: Strong Volitional Swallow: Able to elicit    Oral/Motor/Sensory Function Overall Oral Motor/Sensory Function: Appears within functional limits for tasks assessed   Ice Chips     Thin Liquid Thin Liquid: Impaired Presentation: Self Fed;Straw (demands straw) Pharyngeal  Phase Impairments: Suspected delayed Swallow;Multiple swallows;Decreased hyoid-laryngeal movement;Cough - Delayed    Nectar Thick Nectar Thick Liquid: Not tested   Honey Thick Honey Thick Liquid: Not tested   Puree Puree: Not tested (pt refused)   Solid   GO    Solid: Not tested (Pt refused)      Harlon Ditty, MA CCC-SLP [phone]  Claudine Mouton 05/02/2012,4:03 PM

## 2012-05-03 DIAGNOSIS — J189 Pneumonia, unspecified organism: Secondary | ICD-10-CM

## 2012-05-03 LAB — CBC
MCV: 77.8 fL — ABNORMAL LOW (ref 78.0–100.0)
Platelets: 300 10*3/uL (ref 150–400)
RBC: 3.2 MIL/uL — ABNORMAL LOW (ref 4.22–5.81)
RDW: 18.8 % — ABNORMAL HIGH (ref 11.5–15.5)
WBC: 4.8 10*3/uL (ref 4.0–10.5)

## 2012-05-03 LAB — BASIC METABOLIC PANEL
CO2: 20 mEq/L (ref 19–32)
Chloride: 108 mEq/L (ref 96–112)
Creatinine, Ser: 0.83 mg/dL (ref 0.50–1.35)
GFR calc Af Amer: >90 mL/min (ref >90)
Potassium: 3.6 mEq/L (ref 3.5–5.1)

## 2012-05-03 LAB — T4, FREE: Free T4: 0.56 ng/dL — ABNORMAL LOW (ref 0.80–1.80)

## 2012-05-03 LAB — GLUCOSE, CAPILLARY
Glucose-Capillary: 98 mg/dL (ref 70–99)
Glucose-Capillary: 97 mg/dL (ref 70–99)

## 2012-05-03 LAB — T3: T3, Total: 61 ng/dl — ABNORMAL LOW (ref 80.0–204.0)

## 2012-05-03 MED ORDER — VANCOMYCIN HCL IN DEXTROSE 1-5 GM/200ML-% IV SOLN
1000.0000 mg | Freq: Two times a day (BID) | INTRAVENOUS | Status: DC
  Administered 2012-05-03 – 2012-05-06 (×6): 1000 mg via INTRAVENOUS
  Filled 2012-05-03 (×10): qty 200

## 2012-05-03 NOTE — Progress Notes (Addendum)
[facility] Progress Note [place] TEAM 1 - Stepdown/ICU TEAM   KRISHNA DANCEL ZOX:[id number] DOB: [birth year] DOA: 04/30/2012 PCP: Default, Provider, MD  Brief narrative: 77 year old male patient resident of the nursing facility who is nonambulatory status post prior stroke with residual right-sided hemiparesis. Recently admitted to [facility] 2 weeks ago for question of GI bleed. Back to the hospital because of persistent nausea and vomiting with apparent hematemesis. The vomiting has been intermittent over a period of 2 days. On the morning of admission patient had more frequent episodes. The family was also concerned of the patient's lethargy that in the past has been consistent with some sort of infectious process.   In the emergency department the patient was febrile with a temperature of greater than 102. A chest x-ray revealed bilateral infiltrates concerning for pneumonia. CT abdomen and pelvis did not showing acute abdominal process but did demonstrate features in the lungs concerning for pneumonia. Urinalysis was consistent with possible urinary tract infection. Patient appeared to be dehydrated and hypo-perfusing with a lactic acid level greater than 3 and a low systolic blood pressure. He was subsequently given 3 L of normal saline and blood pressure was 100 systolic at that time the admitting physician evaluated him. He was subsequently admitted to the step down unit.  Assessment/Plan:  Sepsis Due to b/l PNA, UTI and C. Diff colitis  Acute respiratory failure with hypoxia due to : Pneumonia- HCAP vs Aspiration- b/l bases -Question aspiration pneumoniawith recurrent nausea and vomiting -Vanc, Primaxin (also for UTI) and Flagyl (also for C.diff)-    Clostridium difficile colitis - IV Flagyl  -Likely will need at least 7-10 days of treatment  after antibiotics for Pneumonia and UTI are completed   Klebsiella UTI  -Urinalysis abnormal and could be indicative of UTI but  also been related to volume depletion -Resistant to ampicillin, cefazolin, Cipro, Bactrim and Rocephin- sensitive to Zosyn and Imipenem - will dc Levaquin in favor of Primaxin   Lactic acidosis/Dehydration -Renal function is normal except for elevated BUN -Likely exacerbated by diarrhea from colostomy    Hematemesis?/Nausea & vomiting/Chronic anemia -apparently had vomiting dark brown liquid once prior to admission but stool in colostomy was yellow on admission- now more black -DC Protonix infusion in favor of H2 blockers in setting of C. difficile colitis and no active GI bleeding seen. - Hgb low yesterday but may be dilutional as he is 7700 cc positive- will follow   Fever/Leukocytosis -White count has normalized after admission with initiation of empiric antibiotic coverage and adequate rehydration   Metabolic encephalopathy -Resolving, patient appears to be at baseline- talking, oriented  -Due to acute infection as well as hypoperfusion from sirs and dehydration   Dysphagia due to old stroke -Has chronic gastrojejunostomy tube -Tube feeding recommendation is for nutrition as follows: Glucerna 1.2 at 15 ml/hr via PEG, advance by 10 ml/hr every 4 hours, to goal rate of 85 ml/hr with 30 ml Prostat BID via PEG. Total goal rate and protein supplement provides 2648 kcals, 152 gm protein, 1642 ml of free water.  -has passed speech eval- but as usual, appetite is poor   Hypothyroidism -Continue Synthroid per tube -TSH 16.158 -Already on high-dose Synthroid prior to admission so we'll check free T4 and T3 to determine if dose needs to be adjusted upward   DM (diabetes mellitus), secondary, uncontrolled, with peripheral vascular complications -Continue sensitive sliding scale insulin -Was on Lantus 25 units at hour of sleep but have not needed to  resume yet   Hypertension -Blood pressure soft in setting of SIRS -Hold home beta blocker - no rebound tachycardia   Sacral decubitus ulcer,  stage IV -Appreciate wound ostomy care nurse assistance -Current wound measurements: 6 cm x 5 cm x 3.5 cm--apparent new necrosis of wound edges is associated large amount of periwound maceration -Recommendations are PT for hydrotherapy for mechanical debridement of the wound with saline dressings and air mattress for pressure redistribution and moisture management-wound care nurse was notified PT  Poor Pedal pulses Has erythema of tips of toes with a clear line of demarcation- check ABIs  DVT prophylaxis: SCDs Code Status: Full Family Communication: none today Disposition Plan: Remain in step down  Consultants: None  Procedures: None  Antibiotics: Vancomycin 1/16 >>>  Flagyl 1/16 >>> Primaxin 1/17 >>> Zosyn 1/16 >>> 1/17 Levaquin 1/16 >>> 1/17  HPI/Subjective: Patient continues to be alert and communicative- has a breakfast tray but has barely eaten- he has no appetite mild cough, no other complaints.    Objective: Blood pressure 133/109, pulse 80, temperature 97.5 F (36.4 C), temperature source Oral, resp. rate 15, height 5' 10.08" (1.78 m), weight 107 kg (235 lb 14.3 oz), SpO2 100.00%.  Intake/Output Summary (Last 24 hours) at 05/03/12 0935 Last data filed at 05/03/12 0800  Gross per 24 hour  Intake 5610.42 ml  Output    400 ml  Net 5210.42 ml     Exam: General: No acute respiratory distress  Lungs: Bibasilar crackles Cardiovascular: Regular rate and rhythm without murmur gallop or rub normal S1 and S2, no JVD, no peripheral edema Abdomen: Nontender, nondistended, soft, bowel sounds positive, no rebound, no ascites, no appreciable mass; gastrojejunostomy tube currently clamped, colostomy bag intact- dark stools - black/brown Musculoskeletal: No significant cyanosis, clubbing of bilateral lower extremities- mild edema b/l  Neurological: Awake and oriented x3-  able to move all extremities x4 but is very weak, exam nonfocal for any acute findings  Data  Reviewed: Basic Metabolic Panel:  Lab 05/02/12 8295 04/30/12 1747  NA 144 137  K 3.5 4.6  CL 111 97  CO2 21 23  GLUCOSE 98 349*  BUN 31* 54*  CREATININE 0.99 0.97  CALCIUM 8.6 9.9  MG -- --  PHOS -- --   Liver Function Tests:  Lab 05/02/12 0455 04/30/12 1747  AST 27 27  ALT 19 23  ALKPHOS 95 115  BILITOT 0.3 0.4  PROT 7.5 9.4*  ALBUMIN 1.9* 2.2*   No results found for this basename: LIPASE:5,AMYLASE:5 in the last 168 hours No results found for this basename: AMMONIA:5 in the last 168 hours CBC:  Lab 05/02/12 0455 05/01/12 0645 04/30/12 1747  WBC 4.3 8.4 13.6*  NEUTROABS 2.8 -- 11.6*  HGB 7.7* 8.5* 10.1*  HCT 24.7* 26.9* 30.9*  MCV 77.7* 76.2* 76.9*  PLT 291 344 427*   Cardiac Enzymes:  Lab 04/30/12 1805  CKTOTAL --  CKMB --  CKMBINDEX --  TROPONINI <0.30   BNP (last 3 results) No results found for this basename: PROBNP:3 in the last 8760 hours CBG:  Lab 05/03/12 0825 05/03/12 0621 05/02/12 2035 05/02/12 1639 05/02/12 1411  GLUCAP 98 105* 120* 85 100*    Recent Results (from the past 240 hour(s))  CULTURE, BLOOD (ROUTINE X 2)     Status: Normal (Preliminary result)   Collection Time   04/30/12  5:50 PM      Component Value Range Status Comment   Specimen Description BLOOD RIGHT HAND   Final  Special Requests BOTTLES DRAWN AEROBIC AND ANAEROBIC 10CC   Final    Culture  Setup Time 05/01/2012 00:21   Final    Culture     Final    Value:        BLOOD CULTURE RECEIVED NO GROWTH TO DATE CULTURE WILL BE HELD FOR 5 DAYS BEFORE ISSUING A FINAL NEGATIVE REPORT   Report Status PENDING   Incomplete   CULTURE, BLOOD (ROUTINE X 2)     Status: Normal (Preliminary result)   Collection Time   04/30/12  6:00 PM      Component Value Range Status Comment   Specimen Description BLOOD LEFT HAND   Final    Special Requests BOTTLES DRAWN AEROBIC AND ANAEROBIC 10CC   Final    Culture  Setup Time 05/01/2012 00:21   Final    Culture     Final    Value:        BLOOD CULTURE  RECEIVED NO GROWTH TO DATE CULTURE WILL BE HELD FOR 5 DAYS BEFORE ISSUING A FINAL NEGATIVE REPORT   Report Status PENDING   Incomplete   CLOSTRIDIUM DIFFICILE BY PCR     Status: Abnormal   Collection Time   04/30/12  6:27 PM      Component Value Range Status Comment   C difficile by pcr POSITIVE (*) NEGATIVE Final   URINE CULTURE     Status: Normal   Collection Time   04/30/12  7:44 PM      Component Value Range Status Comment   Specimen Description URINE, CATHETERIZED   Final    Special Requests NONE   Final    Culture  Setup Time 04/30/2012 20:52   Final    Colony Count >=100,000 COLONIES/ML   Final    Culture     Final    Value: KLEBSIELLA PNEUMONIAE     Note: Confirmed Extended Spectrum Beta-Lactamase Producer (ESBL)   Report Status 05/02/2012 FINAL   Final    Organism ID, Bacteria KLEBSIELLA PNEUMONIAE   Final   MRSA PCR SCREENING     Status: Abnormal   Collection Time   05/01/12  3:39 AM      Component Value Range Status Comment   MRSA by PCR POSITIVE (*) NEGATIVE Final      Studies:  Recent x-ray studies have been reviewed in detail by the Attending Physician  Scheduled Meds:  Reviewed in detail by the Attending Physician   Calvert Cantor, MD [phone]  If 7PM-7AM, please contact night-coverage [email or web address] Password [email or web address] 05/03/2012, 9:35 AM   LOS: 3 days

## 2012-05-03 NOTE — Progress Notes (Signed)
ANTIBIOTIC CONSULT NOTE  Pharmacy Consult for Vancomycin, Zosyn, Levaquin Indication: pneumonia, Klebsiella pneumoniae ESBL UTI  No Known Allergies  Patient Measurements: Height: 5' 10.08" (178 cm) Weight: 235 lb 14.3 oz (107 kg) (wt. taken w/o bumpers and motor on bed) IBW/kg (Calculated) : 73.18   Labs:  Basename 05/03/12 0916 05/02/12 0455 05/01/12 0645 04/30/12 1747  WBC 4.8 4.3 8.4 --  HGB 7.7* 7.7* 8.5* --  PLT 300 291 344 --  LABCREA -- -- -- --  CREATININE 0.83 0.99 -- 0.97    Assessment: 77 year old male with a history of stroke with right-sided hemiparesis who has been been bed bound.  Seen recently for potential GIB.  Lethargic and febrile in ED.  Chest xray concerning for pneumonia, possible aspiration PNA. ESBL Klebsiella pneumoniae isolated from urine. Vancomycin trough today was supratherapeutic at 25.84mcg/ml on vancomycin 1250mg  IV Q12hr. Renal function is stable.   Goal of Therapy:  Vancomycin trough level 15-20 mcg/ml   Plan:  1) Continue Imipenem 500mg  IV q8h 2) Decrease Vancomycin to 1000 mg IV Q 12 hours 3) Follow up progress, renal function, cultures  Suleman Gunning D. Deaven Barron, PharmD Clinical Pharmacist Pager: [phone] 05/03/2012 12:06 PM

## 2012-05-03 NOTE — Progress Notes (Signed)
HYDROTHERAPY NOTE   05/03/12 1002  Wound 04/13/12 Other (Comment) Sacrum STAGE IV pressure ulcer POA  Date First Assessed/Time First Assessed: 04/13/12 1114   Wound Type: Other (Comment)  Location: (c) Sacrum  Wound Description (Comments): STAGE IV pressure ulcer POA  Present on Admission: Yes  Site / Wound Assessment Granulation tissue;Purple;Red;Clean  % Wound base Red or Granulating 80%  % Wound base Yellow 20%  % Wound base Black 0%  Peri-wound Assessment Intact;Maceration  Margins Unattacted edges (unapproximated)  Closure None  Drainage Amount Minimal  Drainage Description Serosanguineous  Treatment Cleansed;Debridement (Selective);Hydrotherapy (Pulse lavage);Packing (Saline gauze)  Dressing Type Gauze (Comment)  Dressing Changed Changed  Dressing Status Clean;Dry;Intact  Hydrotherapy  Pulsed Lavage with Suction (psi) 4 psi  Pulsed Lavage with Suction - Normal Saline Used 1000 mL  Pulsed Lavage Tip Tip with splash shield  Pulsed lavage therapy - wound location sacral wound  Selective Debridement  Selective Debridement - Location sacrum  Selective Debridement - Tools Used Forceps  Selective Debridement - Tissue Removed Necrotic tissue  Wound Therapy - Assess/Plan/Recommendations  Wound Therapy - Clinical Statement As of this treatment, the sacral wound looks healthy if a little dark on the purple side in places.  There is an excess of maceration that could be addressed with some absorption.  There is a small amount of translucent eschar, but appears to be much decreased.  Wound Therapy - Functional Problem List decreased skin integrity; decreased mobility  Factors Delaying/Impairing Wound Healing Altered sensation;Infection - systemic/local;Immobility;Vascular compromise;Multiple medical problems  Hydrotherapy Plan Debridement;Dressing change;Pulsatile lavage with suction  Wound Therapy - Frequency 6X / week  Wound Plan Debridement;Dressing change;Pulsatile lavage with suction    Wound Therapy Goals - Improve the function of patient's integumentary system by progressing the wound(s) through the phases of wound healing by:  Decrease Necrotic Tissue to 10%  Decrease Necrotic Tissue - Progress Updated due to goal met  Increase Granulation Tissue to 90%  Increase Granulation Tissue - Progress Updated due to goal met  Improve Drainage Characteristics Min  Improve Drainage Characteristics - Progress Progressing toward goal  Time For Goal Achievement 7 days  Wound Therapy - Potential for Goals Good   05/03/2012  Bayard Bing, PT [phone] (pager)

## 2012-05-04 DIAGNOSIS — K920 Hematemesis: Secondary | ICD-10-CM

## 2012-05-04 DIAGNOSIS — R0989 Other specified symptoms and signs involving the circulatory and respiratory systems: Secondary | ICD-10-CM

## 2012-05-04 LAB — GLUCOSE, CAPILLARY
Glucose-Capillary: 115 mg/dL — ABNORMAL HIGH (ref 70–99)
Glucose-Capillary: 122 mg/dL — ABNORMAL HIGH (ref 70–99)
Glucose-Capillary: 128 mg/dL — ABNORMAL HIGH (ref 70–99)

## 2012-05-04 MED ORDER — LEVOTHYROXINE SODIUM 125 MCG PO TABS
125.0000 ug | ORAL_TABLET | Freq: Every day | ORAL | Status: DC
  Administered 2012-05-05 – 2012-05-06 (×2): 125 ug via ORAL
  Filled 2012-05-04 (×3): qty 1

## 2012-05-04 MED ORDER — SODIUM CHLORIDE 0.9 % IV BOLUS (SEPSIS)
500.0000 mL | Freq: Once | INTRAVENOUS | Status: AC
  Administered 2012-05-04: 500 mL via INTRAVENOUS

## 2012-05-04 MED ORDER — GLUCERNA SHAKE PO LIQD: 474.0000 mL | Freq: Three times a day (TID) | ORAL | Status: DC

## 2012-05-04 MED ORDER — PRO-STAT SUGAR FREE PO LIQD
30.0000 mL | Freq: Three times a day (TID) | ORAL | Status: DC
  Administered 2012-05-04: 30 mL via ORAL
  Filled 2012-05-04 (×6): qty 30

## 2012-05-04 NOTE — Progress Notes (Addendum)
[facility] Progress Note [name]   [name] ZOX:[id number] DOB: August 12, 1935 DOA: 04/30/2012 PCP: Default, Provider, MD  Brief narrative: 77 year old male patient resident of the nursing facility who is nonambulatory status post prior stroke with residual right-sided hemiparesis. Recently admitted to [place] 2 weeks ago for question of GI bleed. Back to the hospital because of persistent nausea and vomiting with apparent hematemesis. The vomiting has been intermittent over a period of 2 days. On the morning of admission patient had more frequent episodes. The family was also concerned of the patient's lethargy that in the past has been consistent with some sort of infectious process.   In the emergency department the patient was febrile with a temperature of greater than 102. A chest x-ray revealed bilateral infiltrates concerning for pneumonia. CT abdomen and pelvis did not showing acute abdominal process but did demonstrate features in the lungs concerning for pneumonia. Urinalysis was consistent with possible urinary tract infection. Patient appeared to be dehydrated and hypo-perfusing with a lactic acid level greater than 3 and a low systolic blood pressure. He was subsequently given 3 L of normal saline and blood pressure was 100 systolic at that time the admitting physician evaluated him. He was subsequently admitted to the step down unit.  Assessment/Plan:  Sepsis Due to b/l PNA, UTI and C. Diff colitis  Acute respiratory failure with hypoxia due to : Pneumonia- HCAP vs Aspiration- b/l bases -Question aspiration pneumonia with recurrent nausea and vomiting -Vanc (MRSA pos), Primaxin (also for UTI) and Flagyl (also for C.diff)-   Sepsis due to above (fevers, leukocytosis) resolved   Clostridium difficile colitis - IV Flagyl  -Likely will need at least 7-10 days of treatment  after antibiotics for Pneumonia and UTI are completed   Klebsiella UTI  -Urinalysis abnormal and could be indicative of UTI but also been related to volume depletion -Resistant to ampicillin, cefazolin, Cipro, Bactrim and Rocephin- sensitive to Zosyn and Imipenem - will dc Levaquin in favor of Primaxin   Lactic acidosis/Dehydration -improved    Hematemesis?/Nausea & vomiting/Chronic anemia -apparently had vomiting dark brown liquid once prior to admission but stool in colostomy was yellow on admission- now more black -DC Protonix infusion in favor of H2 blockers in setting of C. difficile colitis and no active GI bleeding seen. - Hgb low yesterday but may be dilutional as he is 7700 cc positive- will follow- check anemia panel   Metabolic encephalopathy -Resolving, patient appears to be at baseline- talking, oriented  -Due to acute infection as well as hypoperfusion from sirs and dehydration   Dysphagia due to old stroke -Has chronic gastrojejunostomy tube -Tube feeding recommendation is for nutrition as follows: Glucerna 1.2 at 15 ml/hr via PEG, advance by 10 ml/hr every 4 hours, to goal rate of 85 ml/hr with 30 ml Prostat BID via PEG. Total goal rate and protein supplement provides 2648 kcals, 152 gm protein, 1642 ml of free water.  -has passed speech eval- but as usual, appetite is poor   Hypothyroidism -Continue Synthroid per tube -TSH 16.158- FT4 low- increase dose from 112 to 125   DM (diabetes mellitus), secondary, uncontrolled, with peripheral vascular complications -Continue sensitive sliding scale insulin -Was on Lantus 25 units at hour of sleep but have not needed to resume yet   Hypertension -Blood pressure soft in setting of SIRS -Hold home beta blocker - no rebound tachycardia   Sacral decubitus ulcer, stage IV -Appreciate wound ostomy care nurse assistance -  Current wound measurements: 6 cm x 5 cm x 3.5 cm--apparent new necrosis of wound edges is associated large amount of periwound maceration -Recommendations are PT for  hydrotherapy for mechanical debridement of the wound with saline dressings and air mattress for pressure redistribution and moisture management-wound care nurse was notified PT  Poor Pedal pulses Has erythema of tips of toes with a clear line of demarcation- ABIs greater than 1   DVT prophylaxis: SCDs Code Status: Full Family Communication: none today Disposition Plan: transfer to med/surg  Consultants: None  Procedures: None  Antibiotics: Vancomycin 1/16 >>>  Flagyl 1/16 >>> Primaxin 1/17 >>> Zosyn 1/16 >>> 1/17 Levaquin 1/16 >>> 1/17  HPI/Subjective: Alert no complaints    Objective: Blood pressure 133/75, pulse 78, temperature 97.8 F (36.6 C), temperature source Oral, resp. rate 13, height 5' 10.08" (1.78 m), weight 111 kg (244 lb 11.4 oz), SpO2 100.00%.  Intake/Output Summary (Last 24 hours) at 05/04/12 1545 Last data filed at 05/04/12 1400  Gross per 24 hour  Intake 4004.58 ml  Output   5200 ml  Net -1195.42 ml     Exam: General: No acute respiratory distress - Large amount of white mucous in mouth today Lungs: Bibasilar crackles Cardiovascular: Regular rate and rhythm without murmur gallop or rub normal S1 and S2, no JVD, no peripheral edema Abdomen: Nontender, nondistended, soft, bowel sounds positive, no rebound, no ascites, no appreciable mass; gastrojejunostomy tube currently clamped, colostomy bag intact- dark stools - black/brown Musculoskeletal: No significant cyanosis, clubbing of bilateral lower extremities- mild edema b/l  Neurological: Awake and oriented x3-  able to move all extremities x4 but is very weak, exam nonfocal for any acute findings  Data Reviewed: Basic Metabolic Panel:  Lab 05/03/12 1610 05/02/12 0455 04/30/12 1747  NA 139 144 137  K 3.6 3.5 4.6  CL 108 111 97  CO2 20 21 23   GLUCOSE 104* 98 349*  BUN 16 31* 54*  CREATININE 0.83 0.99 0.97  CALCIUM 8.9 8.6 9.9  MG -- -- --  PHOS -- -- --   Liver Function Tests:  Lab  05/02/12 0455 04/30/12 1747  AST 27 27  ALT 19 23  ALKPHOS 95 115  BILITOT 0.3 0.4  PROT 7.5 9.4*  ALBUMIN 1.9* 2.2*   No results found for this basename: LIPASE:5,AMYLASE:5 in the last 168 hours No results found for this basename: AMMONIA:5 in the last 168 hours CBC:  Lab 05/03/12 0916 05/02/12 0455 05/01/12 0645 04/30/12 1747  WBC 4.8 4.3 8.4 13.6*  NEUTROABS -- 2.8 -- 11.6*  HGB 7.7* 7.7* 8.5* 10.1*  HCT 24.9* 24.7* 26.9* 30.9*  MCV 77.8* 77.7* 76.2* 76.9*  PLT 300 291 344 427*   Cardiac Enzymes:  Lab 04/30/12 1805  CKTOTAL --  CKMB --  CKMBINDEX --  TROPONINI <0.30   BNP (last 3 results) No results found for this basename: PROBNP:3 in the last 8760 hours CBG:  Lab 05/04/12 1205 05/04/12 0826 05/04/12 0056 05/03/12 2115 05/03/12 1728  GLUCAP 115* 114* 87 99 79    Recent Results (from the past 240 hour(s))  CULTURE, BLOOD (ROUTINE X 2)     Status: Normal (Preliminary result)   Collection Time   04/30/12  5:50 PM      Component Value Range Status Comment   Specimen Description BLOOD RIGHT HAND   Final    Special Requests BOTTLES DRAWN AEROBIC AND ANAEROBIC 10CC   Final    Culture  Setup Time 05/01/2012 00:21   Final  Culture     Final    Value:        BLOOD CULTURE RECEIVED NO GROWTH TO DATE CULTURE WILL BE HELD FOR 5 DAYS BEFORE ISSUING A FINAL NEGATIVE REPORT   Report Status PENDING   Incomplete   CULTURE, BLOOD (ROUTINE X 2)     Status: Normal (Preliminary result)   Collection Time   04/30/12  6:00 PM      Component Value Range Status Comment   Specimen Description BLOOD LEFT HAND   Final    Special Requests BOTTLES DRAWN AEROBIC AND ANAEROBIC 10CC   Final    Culture  Setup Time 05/01/2012 00:21   Final    Culture     Final    Value:        BLOOD CULTURE RECEIVED NO GROWTH TO DATE CULTURE WILL BE HELD FOR 5 DAYS BEFORE ISSUING A FINAL NEGATIVE REPORT   Report Status PENDING   Incomplete   CLOSTRIDIUM DIFFICILE BY PCR     Status: Abnormal   Collection Time    04/30/12  6:27 PM      Component Value Range Status Comment   C difficile by pcr POSITIVE (*) NEGATIVE Final   URINE CULTURE     Status: Normal   Collection Time   04/30/12  7:44 PM      Component Value Range Status Comment   Specimen Description URINE, CATHETERIZED   Final    Special Requests NONE   Final    Culture  Setup Time 04/30/2012 20:52   Final    Colony Count >=100,000 COLONIES/ML   Final    Culture     Final    Value: KLEBSIELLA PNEUMONIAE     Note: Confirmed Extended Spectrum Beta-Lactamase Producer (ESBL)   Report Status 05/02/2012 FINAL   Final    Organism ID, Bacteria KLEBSIELLA PNEUMONIAE   Final   MRSA PCR SCREENING     Status: Abnormal   Collection Time   05/01/12  3:39 AM      Component Value Range Status Comment   MRSA by PCR POSITIVE (*) NEGATIVE Final      Studies:  Recent x-ray studies have been reviewed in detail by the Attending Physician  Scheduled Meds:  Reviewed in detail by the Attending Physician   Calvert Cantor, MD [phone]  If 7PM-7AM, please contact night-coverage [email or web address] Password [email or web address] 05/04/2012, 3:45 PM   LOS: 4 days

## 2012-05-04 NOTE — Progress Notes (Signed)
Extremely flat affect. Pt slept thoughout shift. Agreed to bed bath. Refused all food. Vitals remained stable. Will notify wife of transfer at 1900 to 3900.

## 2012-05-04 NOTE — Progress Notes (Signed)
VASCULAR LAB PRELIMINARY  ARTERIAL  ABI completed:    RIGHT    LEFT    PRESSURE WAVEFORM  PRESSURE WAVEFORM  BRACHIAL 155 T BRACHIAL 157   DP 193 T DP 195 T  AT   AT    PT 202 T PT 188 T  PER   PER    GREAT TOE  NA GREAT TOE  NA    RIGHT LEFT  ABI >1.0 >1.0     Yetta Marceaux, RVT 05/04/2012, 1:37 PM

## 2012-05-05 DIAGNOSIS — I6990 Unspecified sequelae of unspecified cerebrovascular disease: Secondary | ICD-10-CM

## 2012-05-05 DIAGNOSIS — I798 Other disorders of arteries, arterioles and capillaries in diseases classified elsewhere: Secondary | ICD-10-CM

## 2012-05-05 DIAGNOSIS — E872 Acidosis, unspecified: Secondary | ICD-10-CM

## 2012-05-05 DIAGNOSIS — E1351 Other specified diabetes mellitus with diabetic peripheral angiopathy without gangrene: Secondary | ICD-10-CM

## 2012-05-05 DIAGNOSIS — E039 Hypothyroidism, unspecified: Secondary | ICD-10-CM

## 2012-05-05 DIAGNOSIS — R109 Unspecified abdominal pain: Secondary | ICD-10-CM

## 2012-05-05 DIAGNOSIS — R4182 Altered mental status, unspecified: Secondary | ICD-10-CM

## 2012-05-05 DIAGNOSIS — I69991 Dysphagia following unspecified cerebrovascular disease: Secondary | ICD-10-CM

## 2012-05-05 DIAGNOSIS — I10 Essential (primary) hypertension: Secondary | ICD-10-CM

## 2012-05-05 LAB — BASIC METABOLIC PANEL
Calcium: 8.4 mg/dL (ref 8.4–10.5)
Chloride: 109 mEq/L (ref 96–112)
Creatinine, Ser: 0.75 mg/dL (ref 0.50–1.35)
GFR calc Af Amer: >90 mL/min (ref >90)

## 2012-05-05 LAB — CBC
MCH: 23.6 pg — ABNORMAL LOW (ref 26.0–34.0)
MCV: 77 fL — ABNORMAL LOW (ref 78.0–100.0)
Platelets: 283 10*3/uL (ref 150–400)
RDW: 19 % — ABNORMAL HIGH (ref 11.5–15.5)
WBC: 4.3 10*3/uL (ref 4.0–10.5)

## 2012-05-05 LAB — GLUCOSE, CAPILLARY
Glucose-Capillary: 127 mg/dL — ABNORMAL HIGH (ref 70–99)
Glucose-Capillary: 138 mg/dL — ABNORMAL HIGH (ref 70–99)
Glucose-Capillary: 122 mg/dL — ABNORMAL HIGH (ref 70–99)
Glucose-Capillary: 117 mg/dL — ABNORMAL HIGH (ref 70–99)

## 2012-05-05 LAB — IRON AND TIBC
Iron: 30 ug/dL — ABNORMAL LOW (ref 42–135)
TIBC: 152 ug/dL — ABNORMAL LOW (ref 215–435)

## 2012-05-05 LAB — FOLATE: Folate: >20 ng/mL

## 2012-05-05 LAB — RETICULOCYTES: Retic Ct Pct: 1.7 % (ref 0.4–3.1)

## 2012-05-05 LAB — VITAMIN B12: Vitamin B-12: >2000 pg/mL — ABNORMAL HIGH (ref 211–911)

## 2012-05-05 MED ORDER — INSULIN ASPART 100 UNIT/ML ~~LOC~~ SOLN
0.0000 [IU] | Freq: Three times a day (TID) | SUBCUTANEOUS | Status: DC
  Administered 2012-05-06: 2 [IU] via SUBCUTANEOUS

## 2012-05-05 MED ORDER — POTASSIUM CHLORIDE 20 MEQ/15ML (10%) PO LIQD
40.0000 meq | Freq: Three times a day (TID) | ORAL | Status: DC
  Filled 2012-05-05 (×2): qty 30

## 2012-05-05 MED ORDER — FAMOTIDINE 40 MG/5ML PO SUSR
40.0000 mg | Freq: Two times a day (BID) | ORAL | Status: DC
  Administered 2012-05-05 – 2012-05-06 (×2): 40 mg
  Filled 2012-05-05 (×3): qty 5

## 2012-05-05 MED ORDER — GLUCERNA 1.2 CAL PO LIQD
1000.0000 mL | ORAL | Status: DC
  Administered 2012-05-05: 1000 mL
  Filled 2012-05-05 (×6): qty 1000

## 2012-05-05 MED ORDER — SODIUM CHLORIDE 0.9 % IV SOLN
500.0000 mg | Freq: Four times a day (QID) | INTRAVENOUS | Status: DC
  Administered 2012-05-05 – 2012-05-06 (×5): 500 mg via INTRAVENOUS
  Filled 2012-05-05 (×8): qty 500

## 2012-05-05 MED ORDER — METOPROLOL TARTRATE 25 MG PO TABS
25.0000 mg | ORAL_TABLET | Freq: Two times a day (BID) | ORAL | Status: DC
  Administered 2012-05-05 – 2012-05-06 (×3): 25 mg
  Filled 2012-05-05 (×4): qty 1

## 2012-05-05 MED ORDER — PRO-STAT SUGAR FREE PO LIQD
30.0000 mL | Freq: Two times a day (BID) | ORAL | Status: DC
Start: 2012-05-05 — End: 2012-05-06
  Administered 2012-05-05 – 2012-05-06 (×2): 30 mL
  Filled 2012-05-05 (×4): qty 30

## 2012-05-05 MED ORDER — POTASSIUM CHLORIDE CRYS ER 20 MEQ PO TBCR: 40.0000 meq | EXTENDED_RELEASE_TABLET | ORAL | Status: DC

## 2012-05-05 MED ORDER — POTASSIUM CHLORIDE 20 MEQ/15ML (10%) PO LIQD
40.0000 meq | ORAL | Status: AC
  Administered 2012-05-05 (×3): 40 meq via ORAL
  Filled 2012-05-05 (×5): qty 30

## 2012-05-05 NOTE — Progress Notes (Signed)
Clinical Social Worker staffed case with MD.  CSW updated Tammy at [facility]-pt welcome to return once medically ready for dc.   Angelia Mould, MSW, [place] [phone]

## 2012-05-05 NOTE — Progress Notes (Signed)
PT HYDROTHERAPY PROGRESS NOTE   05/05/12 1200  Subjective Assessment  Subjective My head itches  Patient and Family Stated Goals clean up wound  Prior Treatments previous hydrotherapy on prior admissions; wound care center treatments  Evaluation and Treatment  Evaluation and Treatment Procedures Explained to Patient/Family Yes  Evaluation and Treatment Procedures agreed to  Wound 04/13/12 Other (Comment) Sacrum STAGE IV pressure ulcer POA  Date First Assessed/Time First Assessed: 04/13/12 1114   Wound Type: Other (Comment)  Location: (c) Sacrum  Wound Description (Comments): STAGE IV pressure ulcer POA  Present on Admission: Yes  Site / Wound Assessment Granulation tissue;Purple;Red;Clean  % Wound base Red or Granulating 80%  % Wound base Yellow 20%  % Wound base Black 0%  Peri-wound Assessment Intact;Maceration  Margins Unattacted edges (unapproximated)  Closure None  Drainage Amount Minimal  Drainage Description Serosanguineous  Treatment Hydrotherapy (Pulse lavage);Packing (Saline gauze);Tape changed  Dressing Type Gauze (Comment)  Dressing Changed New  Dressing Status Clean;Dry;Intact  Hydrotherapy  Pulsed Lavage with Suction (psi) 4 psi  Pulsed Lavage with Suction - Normal Saline Used 1000 mL  Pulsed Lavage Tip Tip with splash shield  Pulsed lavage therapy - wound location sacral wound  Selective Debridement  Selective Debridement - Location sacrum  Selective Debridement - Tools Used Forceps  Selective Debridement - Tissue Removed Necrotic tissue  Wound Therapy - Assess/Plan/Recommendations  Wound Therapy - Clinical Statement Wound continues to make steady process if healing.  Increase bed of granulation tissue visiable with clean margine and decreased necrotic tissue. Spoke with WOC; will meet during session tomorrow to assess further needs and possible d/c of hydrotherapy at this time.  Wound Therapy - Functional Problem List decreased skin integrity; decreased mobility    Factors Delaying/Impairing Wound Healing Altered sensation;Infection - systemic/local;Immobility;Vascular compromise;Multiple medical problems  Hydrotherapy Plan Debridement;Dressing change;Pulsatile lavage with suction  Wound Therapy - Frequency 6X / week  Wound Plan Debridement;Dressing change;Pulsatile lavage with suction  Wound Therapy Goals - Improve the function of patient's integumentary system by progressing the wound(s) through the phases of wound healing by:  Decrease Necrotic Tissue to 10%  Decrease Necrotic Tissue - Progress Progressing toward goal  Increase Granulation Tissue to 90%  Increase Granulation Tissue - Progress Progressing toward goal  Improve Drainage Characteristics Min  Improve Drainage Characteristics - Progress Met  Time For Goal Achievement 7 days  Wound Therapy - Potential for Goals Good   Charlotte Crumb, PT DPT  [phone]

## 2012-05-05 NOTE — Progress Notes (Addendum)
NUTRITION CONSULT/FOLLOW UP  Intervention:    Initiate Glucerna 1.2 formula at 15 ml/hr via PEG, advance by 10 ml/hr every 4 hours, to goal rate of 85 ml/hr with 30 ml Prostat BID via G-tube. Total goal rate and protein supplement provides 2648 kcals, 152 gm protein, 1642 ml of free water RD to follow for nutrition care plan  Nutrition Dx:   Inadequate oral intake related to hx of dysphagia as evidenced by use of G-tube for main source of nutrition, ongoing  Goal:   EN to meet >/= 90% of estimated nutrition needs, currently unmet  Monitor:   EN regimen & tolerance, weight, labs, I/O's  Assessment:   RD consult for EN initiation and management.  S/p bedside swallow evaluation 1/17.  CWOCN note reviewed 1/16.  Per [facility] records, patient receives comfort feeds with known risk for aspiration.  Patient receives 100% of daily kcal via G-tube ---> Diabetesource formula @ 150 ml/hr x 20 hours (3 pm to 11 am).  Height: Ht Readings from Last 1 Encounters:  04/30/12 5' 10.08" (1.78 m)    Weight Status:   Wt Readings from Last 1 Encounters:  05/04/12 244 lb 11.4 oz (111 kg)    Re-estimated needs:  Kcal: 2500-2800  Protein: 120-145 gm Fluid: 2.5-2.8 L  Skin: Stage 4 sacral decubitus ulcer   Diet Order: Dysphagia 2, thin liquid   Intake/Output Summary (Last 24 hours) at 05/05/12 1103 Last data filed at 05/05/12 0737  Gross per 24 hour  Intake   4685 ml  Output   1350 ml  Net   3335 ml    Last BM: 1/20  Labs:   Lab 05/03/12 0916 05/02/12 0455 04/30/12 1747  NA 139 144 137  K 3.6 3.5 4.6  CL 108 111 97  CO2 20 21 23   BUN 16 31* 54*  CREATININE 0.83 0.99 0.97  CALCIUM 8.9 8.6 9.9  MG -- -- --  PHOS -- -- --  GLUCOSE 104* 98 349*    CBG (last 3)   Basename 05/05/12 0735 05/05/12 0418 05/05/12 0110  GLUCAP 138* 144* 127*    Scheduled Meds:   . atropine  2 drop Both Eyes TID AC & HS  . Chlorhexidine Gluconate Cloth  6 each Topical Q0600  . famotidine   20 mg Per Tube BID  . feeding supplement  30 mL Oral TID WC  . imipenem-cilastatin  500 mg Intravenous Q6H  . insulin aspart  0-9 Units Subcutaneous TID WC  . levothyroxine  125 mcg Oral QAC breakfast  . liothyronine  25 mcg Per Tube Daily  . metoprolol tartrate  25 mg Per Tube BID  . metronidazole  500 mg Intravenous Q8H  . mupirocin ointment  1 application Nasal BID  . scopolamine  1 patch Transdermal Q72H  . sodium chloride  3 mL Intravenous Q12H  . thiamine  100 mg Per Tube Daily  . vancomycin  1,000 mg Intravenous Q12H  . zinc sulfate  220 mg Per Tube Daily    Continuous Infusions:   . sodium chloride 150 mL/hr at 05/05/12 0700  . pantoprozole (PROTONIX) infusion 8 mg/hr (05/04/12 2201)    Maureen Chatters, RD, LDN Pager #: [phone] After-Hours Pager #: [phone]

## 2012-05-05 NOTE — Progress Notes (Signed)
Pt refusing oral care and sublingual atropine drops at this time.   RN emphasized the importance of oral care and decreasing the amount of secretions. Pt continues to refuse.  Pt also refusing to be repositioned every 2 hours. Pt states he will notify the RN when he wishes to be repositioned. Will continue to offer routine care as ordered to pt.  Efraim Kaufmann

## 2012-05-05 NOTE — Progress Notes (Signed)
[facility] Progress Note [place] TEAM 1 - Stepdown/ICU TEAM   [name] RUE:[id number] DOB: [birth year] DOA: 04/30/2012 PCP: Default, Provider, MD  Brief narrative: 77 year old male patient resident of the nursing facility who is nonambulatory status post prior stroke with residual right-sided hemiparesis. Recently admitted to [place] 2 weeks ago for question of GI bleed. Back to the hospital because of persistent nausea and vomiting with apparent hematemesis. The vomiting has been intermittent over a period of 2 days. On the morning of admission patient had more frequent episodes. The family was also concerned of the patient's lethargy that in the past has been consistent with some sort of infectious process.  In the emergency department the patient was febrile with a temperature of greater than 102. A chest x-ray revealed bilateral infiltrates concerning for pneumonia. CT abdomen and pelvis did not showing acute abdominal process but did demonstrate features in the lungs concerning for pneumonia as well. Urinalysis was consistent with possible urinary tract infection. Patient appeared to be dehydrated and hypo-perfusing with a lactic acid level greater than 3 and a low systolic blood pressure.  Assessment/Plan: Sepsis Due to Healthcare associated PNA, Klebsiella UTI and C. Diff colitis; now resolved. -Lactic acidosis, Fevers and Leukocytosis resolved.  Acute respiratory failure with hypoxia due to : Pneumonia- HCAP vs Aspiration- b/l bases -Question aspiration pneumonia with recurrent nausea and vomiting -Will continue current antibiotics Vanc (MRSA pos), Primaxin (also for UTI) and Flagyl (also for C.diff)-  -will place PICC line as patient might need at least 7 more days of antibiotic treatment and access is very limited.  Clostridium difficile colitis -Continue IV Flagyl for now -will need at least 7-10 extra days of treatment after antibiotics for Pneumonia and UTI  are completed  Klebsiella UTI  -Urinalysis abnormal and sensitivity demonstrating Klebsiella microorganisms  -Resistant to ampicillin, cefazolin, Cipro, Bactrim and Rocephin- sensitive to Zosyn and Imipenem   Lactic acidosis/Dehydration Resolved.   Hematemesis?/Nausea & vomiting/Chronic anemia -apparently had vomiting dark brown liquid once prior to admission but stool in colostomy was yellow on admission -DC Protonix infusion in favor of H2 blockers in setting of C. difficile colitis and no active GI bleeding seen. - Hgb low yesterday but may be dilutional  -CBC in am to follow Hgb   Metabolic encephalopathy -Resolving, patient appears to be at baseline- talking, oriented  -Due to acute infection as well as hypoperfusion from sepsis and dehydration   Dysphagia due to old stroke -Has chronic gastrojejunostomy tube -Tube feeding recommendation is for nutrition as follows: Glucerna 1.2 at 15 ml/hr via PEG, advance by 10 ml/hr every 4 hours, to goal rate of 85 ml/hr with 30 ml Prostat BID via PEG. Total goal rate and protein supplement provides 2648 kcals, 152 gm protein, 1642 ml of free water.  -has passed speech eval- but as usual, appetite is poor -Continue TF   Hypothyroidism -TSH 16.158- FT4 low -synthroid dose adjusted to 125 mcg daily   DM (diabetes mellitus), secondary, uncontrolled, with peripheral vascular complications -Continue sensitive sliding scale insulin -Was on Lantus 25 units QHS prior to admission; might need to be resumed as soon as TF restarted; will follow.   Hypertension -Blood pressure soft in setting of Sepsis -slightly rising -will resume B-blocker once BP elevated for at least 24 consecutive hours.   Sacral decubitus ulcer, stage IV -Appreciate wound ostomy care nurse assistance -Current wound measurements: 6 cm x 5 cm x 3.5 cm--apparent new necrosis of wound edges is associated large  amount of peri wound maceration -Recommendations are PT for  hydrotherapy for mechanical debridement of the wound with saline dressings and air mattress for pressure redistribution and moisture management  Poor Pedal pulses Has erythema of tips of toes with a clear line of demarcation- ABIs greater than 1   Hypokalemia: will replete  DVT prophylaxis: SCDs Code Status: Full Family Communication: no family at bedside Disposition Plan: place PICC; back to SNF when medically stable.  Antibiotics: Vancomycin 1/16 >>>  Flagyl 1/16 >>> Primaxin 1/17 >>> Zosyn 1/16 >>> 1/17 Levaquin 1/16 >>> 1/17  HPI/Subjective: Alert, afebrile and w/o acute complaints    Objective: Blood pressure 148/83, pulse 80, temperature 97.7 F (36.5 C), temperature source Axillary, resp. rate 20, height 5' 10.08" (1.78 m), weight 111 kg (244 lb 11.4 oz), SpO2 97.00%.  Intake/Output Summary (Last 24 hours) at 05/05/12 1518 Last data filed at 05/05/12 0800  Gross per 24 hour  Intake   3900 ml  Output    650 ml  Net   3250 ml     Exam: General: No acute respiratory distress - Large amount of white mucous in mouth today Lungs: decrease BS bibasilar,  no wheezing; diffuse rhonchi Cardiovascular: Regular rate and rhythm without murmur gallop or rub normal S1 and S2, no JVD, no peripheral edema Abdomen: Nontender, nondistended, soft, bowel sounds positive, no rebound, no ascites, no mass; gastrojejunostomy tube currently clamped, colostomy bag intact- dark brown stools Musculoskeletal: No significant cyanosis or clubbing; trace edema b/l  Neurological: Awake and oriented x3-  able to move all extremities x4 but is very weak, exam nonfocal for any new acute findings  Data Reviewed: Basic Metabolic Panel:  Lab 05/05/12 1610 05/03/12 0916 05/02/12 0455 04/30/12 1747  NA 137 139 144 137  K 2.8* 3.6 3.5 4.6  CL 109 108 111 97  CO2 15* 20 21 23   GLUCOSE 126* 104* 98 349*  BUN 15 16 31* 54*  CREATININE 0.75 0.83 0.99 0.97  CALCIUM 8.4 8.9 8.6 9.9  MG -- -- -- --    PHOS -- -- -- --   Liver Function Tests:  Lab 05/02/12 0455 04/30/12 1747  AST 27 27  ALT 19 23  ALKPHOS 95 115  BILITOT 0.3 0.4  PROT 7.5 9.4*  ALBUMIN 1.9* 2.2*   CBC:  Lab 05/05/12 1050 05/03/12 0916 05/02/12 0455 05/01/12 0645 04/30/12 1747  WBC 4.3 4.8 4.3 8.4 13.6*  NEUTROABS -- -- 2.8 -- 11.6*  HGB 8.3* 7.7* 7.7* 8.5* 10.1*  HCT 27.1* 24.9* 24.7* 26.9* 30.9*  MCV 77.0* 77.8* 77.7* 76.2* 76.9*  PLT 283 300 291 344 427*   Cardiac Enzymes:  Lab 04/30/12 1805  CKTOTAL --  CKMB --  CKMBINDEX --  TROPONINI <0.30   CBG:  Lab 05/05/12 1154 05/05/12 0735 05/05/12 0418 05/05/12 0110 05/04/12 1943  GLUCAP 151* 138* 144* 127* 128*    Recent Results (from the past 240 hour(s))  CULTURE, BLOOD (ROUTINE X 2)     Status: Normal (Preliminary result)   Collection Time   04/30/12  5:50 PM      Component Value Range Status Comment   Specimen Description BLOOD RIGHT HAND   Final    Special Requests BOTTLES DRAWN AEROBIC AND ANAEROBIC 10CC   Final    Culture  Setup Time 05/01/2012 00:21   Final    Culture     Final    Value:        BLOOD CULTURE RECEIVED NO GROWTH TO DATE  CULTURE WILL BE HELD FOR 5 DAYS BEFORE ISSUING A FINAL NEGATIVE REPORT   Report Status PENDING   Incomplete   CULTURE, BLOOD (ROUTINE X 2)     Status: Normal (Preliminary result)   Collection Time   04/30/12  6:00 PM      Component Value Range Status Comment   Specimen Description BLOOD LEFT HAND   Final    Special Requests BOTTLES DRAWN AEROBIC AND ANAEROBIC 10CC   Final    Culture  Setup Time 05/01/2012 00:21   Final    Culture     Final    Value:        BLOOD CULTURE RECEIVED NO GROWTH TO DATE CULTURE WILL BE HELD FOR 5 DAYS BEFORE ISSUING A FINAL NEGATIVE REPORT   Report Status PENDING   Incomplete   CLOSTRIDIUM DIFFICILE BY PCR     Status: Abnormal   Collection Time   04/30/12  6:27 PM      Component Value Range Status Comment   C difficile by pcr POSITIVE (*) NEGATIVE Final   URINE CULTURE      Status: Normal   Collection Time   04/30/12  7:44 PM      Component Value Range Status Comment   Specimen Description URINE, CATHETERIZED   Final    Special Requests NONE   Final    Culture  Setup Time 04/30/2012 20:52   Final    Colony Count >=100,000 COLONIES/ML   Final    Culture     Final    Value: KLEBSIELLA PNEUMONIAE     Note: Confirmed Extended Spectrum Beta-Lactamase Producer (ESBL)   Report Status 05/02/2012 FINAL   Final    Organism ID, Bacteria KLEBSIELLA PNEUMONIAE   Final   MRSA PCR SCREENING     Status: Abnormal   Collection Time   05/01/12  3:39 AM      Component Value Range Status Comment   MRSA by PCR POSITIVE (*) NEGATIVE Final        [name] [phone]  If 7PM-7AM, please contact night-coverage [email or web address] Password [email or web address] 05/05/2012, 3:18 PM   LOS: 5 days

## 2012-05-06 ENCOUNTER — Inpatient Hospital Stay (HOSPITAL_COMMUNITY): Payer: Medicare Other

## 2012-05-06 LAB — BASIC METABOLIC PANEL
Calcium: 8.9 mg/dL (ref 8.4–10.5)
GFR calc non Af Amer: 87 mL/min — ABNORMAL LOW (ref >90)
Sodium: 138 mEq/L (ref 135–145)

## 2012-05-06 LAB — CBC
MCH: 24.3 pg — ABNORMAL LOW (ref 26.0–34.0)
MCHC: 31.7 g/dL (ref 30.0–36.0)
Platelets: 307 10*3/uL (ref 150–400)

## 2012-05-06 LAB — GLUCOSE, CAPILLARY: Glucose-Capillary: 130 mg/dL — ABNORMAL HIGH (ref 70–99)

## 2012-05-06 MED ORDER — VANCOMYCIN HCL IN DEXTROSE 1-5 GM/200ML-% IV SOLN: 1000.0000 mg | Freq: Two times a day (BID) | INTRAVENOUS | Status: AC

## 2012-05-06 MED ORDER — HEPARIN SOD (PORK) LOCK FLUSH 100 UNIT/ML IV SOLN
250.0000 [IU] | INTRAVENOUS | Status: AC | PRN
  Administered 2012-05-06: 250 [IU]

## 2012-05-06 MED ORDER — SODIUM CHLORIDE 0.9 % IV SOLN: 500.0000 mg | Freq: Four times a day (QID) | INTRAVENOUS | Status: AC

## 2012-05-06 MED ORDER — TRAMADOL HCL 50 MG PO TABS: 50.0000 mg | ORAL_TABLET | Freq: Four times a day (QID) | ORAL | Status: DC | PRN

## 2012-05-06 MED ORDER — GLUCERNA 1.2 CAL PO LIQD: 1000.0000 mL | ORAL | Status: DC

## 2012-05-06 MED ORDER — SODIUM CHLORIDE 0.9 % IV SOLN
INTRAVENOUS | Status: DC | PRN
  Administered 2012-05-06: 20 mL/h via INTRAVENOUS

## 2012-05-06 MED ORDER — SODIUM CHLORIDE 0.9 % IJ SOLN
10.0000 mL | INTRAMUSCULAR | Status: DC | PRN
  Administered 2012-05-06: 10 mL

## 2012-05-06 MED ORDER — LEVOTHYROXINE SODIUM 125 MCG PO TABS: 125.0000 ug | ORAL_TABLET | Freq: Every day | ORAL | Status: DC

## 2012-05-06 MED ORDER — PRO-STAT SUGAR FREE PO LIQD: 30.0000 mL | Freq: Two times a day (BID) | ORAL | Status: DC

## 2012-05-06 MED ORDER — INSULIN GLARGINE 100 UNIT/ML ~~LOC~~ SOLN: 20.0000 [IU] | Freq: Every day | SUBCUTANEOUS | Status: DC

## 2012-05-06 MED ORDER — METRONIDAZOLE 500 MG PO TABS: 500.0000 mg | ORAL_TABLET | Freq: Three times a day (TID) | ORAL | Status: AC

## 2012-05-06 NOTE — Progress Notes (Signed)
NP K.Kirby paged and made aware of patient refusing his tube feeds at this time, and most of his medications. No new orders received at this time. Will continue to monitor.

## 2012-05-06 NOTE — Progress Notes (Signed)
Clinical Social Worker staffed case with MD and RN-pt ready for dc back to [facility].  CSW updated SNF and family.  CSW to continue to follow and assist as needed.   Angelia Mould, MSW, [place] [phone]

## 2012-05-06 NOTE — Progress Notes (Signed)
ANTIBIOTIC CONSULT NOTE  Pharmacy Consult for Vancomycin + primaxin Indication: pneumonia, Klebsiella pneumoniae ESBL UTI  No Known Allergies  Patient Measurements: Height: 5' 10.08" (178 cm) Weight: 244 lb 11.4 oz (111 kg) IBW/kg (Calculated) : 73.18   Labs:  Basename 05/06/12 0520 05/05/12 1050  WBC 4.4 4.3  HGB 8.9* 8.3*  PLT 307 283  LABCREA -- --  CREATININE 0.77 0.75    Assessment: 77 year old male with a history of stroke with right-sided hemiparesis who has been been bed bound.  Seen recently for potential GIB.  Lethargic and febrile in ED.  Chest xray concerning for pneumonia, possible aspiration PNA. ESBL Klebsiella pneumoniae isolated from urine. Pt continues on vancomycin D#6 and primaxin D#5. MD wants at least 10 days abx. Pt is afebrile and WBC is WNL. He is also Cdiff positive and continues on flagyl. Dose this AM was delayed by several hours d/t PICC placement so will defer trough for now.   Goal of Therapy:  Vancomycin trough level 15-20 mcg/ml  Plan:  1) Change Imipenem 500mg  IV q6h (done 1/20) 2) Continue vanc as ordered - will consider another trough soon 3) F/u renal fxn, C&S, clinical status and trough when appropriate  Lysle Pearl, PharmD, BCPS Pager # [phone] 05/06/2012 11:07 AM

## 2012-05-06 NOTE — Progress Notes (Signed)
Speech Language Pathology Dysphagia Treatment Patient Details Name: [name] MRN: [id number] DOB: Dec 12, 1935 Today's Date: 05/06/2012 Time: 1020-1040 SLP Time Calculation (min): 20 min  Assessment / Plan / Recommendation Clinical Impression  Pt seen with wife present. Treatment session focused on pt/family education regarding aspiration risk, diet recommendation and strategies to reduce risk. Discussed fluctuating nature of dysphagia depending on pts day to day function with pts wife. She verbalized understanding of aspiration risk (aspiration of thin liquids, reflux of tube feeds and aspiration of vomit) and confirmed that she wanted her husband to continue consuming PO despite risk though she did want him to be safe. SLP offered strategies to reduce impact of aspiration including oral care, appropriate posture and monitoring closely for decreased tolerance due to fatigue, weakness and AMS. Pts wife was appreciative of discussion and also verbalized that though pt does not often eat solids he is able to chew (pt has never accepted solid trials for SLP observation). She feels that he would be more likely to consume a regular consistency diet because it would be more appetizing to him. Will upgrade diet to regular/thin to liberalize pts options. Education complete, will sign off at this time.     Diet Recommendation  Initiate / Change Diet: Regular;Thin liquid    SLP Plan All goals met;Discharge SLP treatment due to (comment)   Pertinent Vitals/Pain NA   Swallowing Goals  SLP Swallowing Goals Patient will utilize recommended strategies during swallow to increase swallowing safety with: Minimal cueing Swallow Study Goal #2 - Progress: Met  General Temperature Spikes Noted: No Respiratory Status: Room air Behavior/Cognition: Alert;Cooperative Oral Cavity - Dentition: Adequate natural dentition Patient Positioning: Upright in bed  Oral Cavity - Oral Hygiene Patient is HIGH RISK - Oral  Care Protocol followed (see row info): Yes   Dysphagia Treatment Treatment focused on: Skilled observation of diet tolerance;Patient/family/caregiver education;Utilization of compensatory strategies Family/Caregiver Educated: wife Treatment Methods/Modalities: Skilled observation Patient observed directly with PO's: Yes Type of PO's observed: Thin liquids Feeding: Needs assist Liquids provided via: Straw Pharyngeal Phase Signs & Symptoms: Multiple swallows;Delayed cough Type of cueing: Verbal Amount of cueing: Minimal   GO    Harlon Ditty, MA CCC-SLP [phone]  Claudine Mouton 05/06/2012, 11:34 AM

## 2012-05-06 NOTE — Consult Note (Signed)
WOC follow up Wound type: Stage IV pressure ulcer Pressure Ulcer POA: Yes Measurement:4cm x 3cm x 1cm  Wound bed: clean, pink, moist with early granulation tissue Drainage (amount, consistency, odor) serosanguinous, no odor Periwound: intact, much improved Dressing procedure/placement/frequency: pt has had hydrotherapy x 1 wk and the wound is now very clean and much improved. The decline of the wound was felt to be related to dressings that were too moist and placement of these dressing not only in the wound, but on the periwound which had led to maceration of this tissue and breakdown of the periwound tissue. Will continue normal saline dressings, however will need to only be damp not saturated with saline and only placed in the wound, being careful to not place the moist gauze onto the good skin.  New orders written, hydrotherapy dced.  WOC will follow along with you for wound progress Sariya Trickey Eliberto Ivory RN,CWOCN [phone]

## 2012-05-06 NOTE — Progress Notes (Signed)
PT HYDROTHERAPY PROGRESS NOTE   05/06/12 1200  Subjective Assessment  Subjective I would like some water  Patient and Family Stated Goals clean up wound  Evaluation and Treatment  Evaluation and Treatment Procedures Explained to Patient/Family Yes  Evaluation and Treatment Procedures agreed to  Wound 04/13/12 Other (Comment) Sacrum STAGE IV pressure ulcer POA  Date First Assessed/Time First Assessed: 04/13/12 1114   Wound Type: Other (Comment)  Location: (c) Sacrum  Wound Description (Comments): STAGE IV pressure ulcer POA  Present on Admission: Yes  Site / Wound Assessment Granulation tissue;Purple;Red;Clean  % Wound base Red or Granulating 95%  % Wound base Yellow 5%  Peri-wound Assessment Intact;Maceration  Wound Length (cm) 4 cm  Wound Width (cm) 3 cm  Wound Depth (cm) 1 cm  Drainage Amount Minimal  Drainage Description Serosanguineous  Dressing Type Gauze (Comment)  Dressing Changed New  Dressing Status Clean;Dry;Intact  Wound Therapy - Assess/Plan/Recommendations  Wound Therapy - Clinical Statement Reassessed wound with WOC nurse.Wound bed is clean and appropriate for d/c from hydro therapy at this time. Goals met, patient and wife educated on type of dressing to be used (dampy to dry gauze). No further hydrotherapy needs.   Wound Therapy Goals - Improve the function of patient's integumentary system by progressing the wound(s) through the phases of wound healing by:  Decrease Necrotic Tissue - Progress Met  Increase Granulation Tissue - Progress Met  Improve Drainage Characteristics - Progress Met    Charlotte Crumb, PT DPT  [phone]

## 2012-05-06 NOTE — Progress Notes (Signed)
Peripherally Inserted Central Catheter/Midline Placement  The IV Nurse has discussed with the patient and/or persons authorized to consent for the patient, the purpose of this procedure and the potential benefits and risks involved with this procedure.  The benefits include less needle sticks, lab draws from the catheter and patient may be discharged home with the catheter.  Risks include, but not limited to, infection, bleeding, blood clot (thrombus formation), and puncture of an artery; nerve damage and irregular heat beat.  Alternatives to this procedure were also discussed.  PICC/Midline Placement Documentation  PICC / Midline Single Lumen 05/06/12 PICC Left Brachial (Active)       Stacie Glaze Horton 05/06/2012, 10:09 AM

## 2012-05-06 NOTE — Discharge Summary (Signed)
Physician Discharge Summary  [name] ZOX:[id number] DOB: 23-Jun-1935 DOA: 04/30/2012  PCP: Default, Provider, MD  Admit date: 04/30/2012 Discharge date: 05/06/2012  Time spent: >30 minutes  Recommendations for Outpatient treatment: Take medications as prescribed -Make sure to provide aspiration as needed of extra secretions inside his mouth -keep patient well hydrated -Normal saline fluff gauze(only in wound bed), please try to limit moist gauze on periwound skin, cover with ABD and secure with tape. Change daily. Preventive measures for decubitus ulcer -CBC and BMET in 5 days to follow Hgb, electrolytes and kidney function  Discharge Diagnoses:  Principal Problem:  *SIRS (systemic inflammatory response syndrome) Active Problems:  Dysphagia due to old stroke  Hypothyroidism  DM (diabetes mellitus), secondary, uncontrolled, with peripheral vascular complications  Hypertension  Lactic acidosis  Sacral decubitus ulcer, stage IV  uti  Hematemesis  Aspiration pneumonia  Nausea & vomiting  Chronic anemia  Clostridium difficile colitis  Acute respiratory failure with hypoxia  HCAP (healthcare-associated pneumonia)  Fever  Leukocytosis  Dehydration  Metabolic encephalopathy   Discharge Condition: stable and improved. Will be transferred back to SNF to finish antibiotics and to continue supportive care and rehabilitation. VS stable and no acute complaints at discharge.  Diet recommendation: low carb diet, regular diet and thin liquids. Continue tube feedings  Filed Weights   04/30/12 1723 05/03/12 0655 05/04/12 0500  Weight: 95.4 kg (210 lb 5.1 oz) 107 kg (235 lb 14.3 oz) 111 kg (244 lb 11.4 oz)    History of present illness:  77 year old male patient resident of the nursing facility who is nonambulatory status post prior stroke with residual right-sided hemiparesis. Recently admitted to [facility] 2 weeks ago for question of GI bleed. Back to the hospital because of  persistent nausea and vomiting with apparent hematemesis. The vomiting has been intermittent over a period of 2 days. On the morning of admission patient had more frequent episodes. The family was also concerned of the patient's lethargy that in the past has been consistent with some sort of infectious process.  In the emergency department the patient was febrile with a temperature of greater than 102. A chest x-ray revealed bilateral infiltrates concerning for pneumonia. CT abdomen and pelvis did not showing acute abdominal process but did demonstrate features in the lungs concerning for pneumonia as well. Urinalysis was consistent with possible urinary tract infection. Patient appeared to be dehydrated and hypo-perfusing with a lactic acid level greater than 3 and a low systolic blood pressure.   Hospital Course:  Sepsis  Due to Healthcare associated PNA, Klebsiella UTI and C. Diff colitis; now resolved.  -Lactic acidosis, Fevers and Leukocytosis resolved.   Acute respiratory failure with hypoxia due to :  Pneumonia- HCAP vs Aspiration- b/l bases  -Question aspiration pneumonia with recurrent nausea and vomiting  -Will continue current antibiotics Vanc (MRSA pos), Primaxin (also for UTI) and Flagyl (also for C.diff)-  -will place PICC line as patient might need at least 7 more days of antibiotic treatment and access is very limited.   Clostridium difficile colitis  -will need at least 10 extra days of treatment after antibiotics for Pneumonia and UTI are completed   Klebsiella UTI  -Urinalysis abnormal and sensitivity demonstrating Klebsiella microorganisms  -Resistant to ampicillin, cefazolin, Cipro, Bactrim and Rocephin- sensitive to Zosyn and Imipenem   Lactic acidosis/Dehydration  Resolved.   Hematemesis?/Nausea & vomiting/Chronic anemia  -apparently had vomiting dark brown liquid once prior to admission but stool in colostomy was yellow on admission  -  DC Protonix infusion in favor  of H2 blockers in setting of C. difficile colitis and no active GI bleeding seen.  - Hgb low yesterday but may be dilutional  -CBC in am to follow Hgb   Metabolic encephalopathy  -Resolving, patient appears to be at baseline- talking, oriented  -Due to acute infection as well as hypoperfusion from sepsis and dehydration   Dysphagia due to old stroke  -Has chronic gastrojejunostomy tube  -Tube feeding recommendation is for nutrition as follows:  Glucerna 1.2 at 15 ml/hr via PEG, advance by 10 ml/hr every 4 hours, to goal rate of 85 ml/hr with 30 ml Prostat BID via PEG. Total goal rate and protein supplement provides 2648 kcals, 152 gm protein, 1642 ml of free water.  -has passed speech eval- but as usual, appetite is poor  -Continue TF   Hypothyroidism  -TSH 16.158- FT4 low  -synthroid dose adjusted to 125 mcg daily   DM (diabetes mellitus), secondary, uncontrolled, with peripheral vascular complications  -Continue sliding scale insulin and lantus  Hypertension  -Blood pressure stable again -resume b-blocker  Sacral decubitus ulcer, stage IV  -Appreciate wound ostomy care nurse assistance  -Continue wound care as instructed by wound care specialist  Poor Pedal pulses  Has erythema of tips of toes with a clear line of demarcation- ABIs greater than 1   Hypokalemia: Repleted   Discharge Exam: Filed Vitals:   05/05/12 1103 05/05/12 1330 05/05/12 1957 05/06/12 0411  BP: 130/76 148/83 144/83 107/76  Pulse: 116 80 81 112  Temp:  97.7 F (36.5 C) 97.8 F (36.6 C) 97.5 F (36.4 C)  TempSrc:   Oral Oral  Resp:  20 20 20   Height:      Weight:      SpO2:  97% 96% 94%    General: NAD; afebrile, AAOX3 Lungs: decrease BS bibasilar, no wheezing; diffuse rhonchi  Cardiovascular: Regular rate and rhythm without murmur gallop or rub normal S1 and S2, no JVD, no peripheral edema  Abdomen: Nontender, nondistended, soft, bowel sounds positive, no rebound, no ascites, no mass;  gastrojejunostomy tube currently clamped, colostomy bag intact- dark brown stools  Musculoskeletal: No significant cyanosis or clubbing; trace edema b/l  Neurological: Awake and oriented x3- able to move all extremities x4 but is very weak, exam nonfocal for any new acute findings   Discharge Instructions  Discharge Orders    Future Orders Please Complete By Expires   Discharge instructions      Comments:   Take medications as prescribed -Make sure to provide aspiration as needed of extra secretions inside his mouth -keep patient well hydrated -wet to dry changes BID and preventive measures for decubitus ulcer -CBC and BMET in 5 days to follow Hgb, electrolytes and kidney function       Medication List     As of 05/06/2012  2:23 PM    STOP taking these medications         glyBURIDE 2.5 MG tablet   Commonly known as: DIABETA      pantoprazole sodium 40 mg/20 mL Pack   Commonly known as: PROTONIX      saccharomyces boulardii 250 MG capsule   Commonly known as: FLORASTOR      TAKE these medications         acetaminophen 160 MG/5ML elixir   Commonly known as: TYLENOL   Give 640 mg by tube every 6 (six) hours as needed. For pain      acetaminophen 650 MG suppository  Commonly known as: TYLENOL   Place 650 mg rectally every 4 (four) hours as needed. For fever      atropine 1 % ophthalmic solution   Take 2 drops by mouth every 6 (six) hours.      citalopram 10 MG tablet   Commonly known as: CELEXA   10 mg by PEG Tube route daily.      feeding supplement (GLUCERNA 1.2 CAL) Liqd   Place 1,000 mLs into feeding tube continuous.      feeding supplement Liqd   Place 30 mLs into feeding tube 2 (two) times daily.      ferrous sulfate 300 (60 FE) MG/5ML syrup   Give 300 mg by tube 2 (two) times daily.      gabapentin 300 MG capsule   Commonly known as: NEURONTIN   Give 300 mg by tube at bedtime.      guaiFENesin-dextromethorphan 100-10 MG/5ML syrup   Commonly known  as: ROBITUSSIN DM   10 mLs by PEG Tube route every 6 (six) hours as needed. For productive cough x 2 days      insulin aspart 100 UNIT/ML injection   Commonly known as: novoLOG   Inject 5 Units into the skin 3 (three) times daily before meals. For cbg > 250      insulin glargine 100 UNIT/ML injection   Commonly known as: LANTUS   Inject 20 Units into the skin at bedtime.      levothyroxine 125 MCG tablet   Commonly known as: SYNTHROID, LEVOTHROID   Take 1 tablet (125 mcg total) by mouth daily.      liothyronine 25 MCG tablet   Commonly known as: CYTOMEL   25 mcg by PEG Tube route daily.      metoprolol tartrate 25 MG tablet   Commonly known as: LOPRESSOR   Give 25 mg by tube 2 (two) times daily.      metroNIDAZOLE 500 MG tablet   Commonly known as: FLAGYL   Take 1 tablet (500 mg total) by mouth 3 (three) times daily. Per tube      multivitamins ther. w/minerals Tabs   Give 1 tablet by tube daily.      potassium chloride 20 MEQ/15ML (10%) solution   Give 10 mEq by tube daily.      promethazine 25 MG tablet   Commonly known as: PHENERGAN   Give 25 mg by tube every 6 (six) hours as needed. For nausea      ranitidine 75 MG tablet   Commonly known as: ZANTAC   Give 150 mg by tube 2 (two) times daily.      scopolamine 1.5 MG   Commonly known as: TRANSDERM-SCOP   Place 1 patch onto the skin every 3 (three) days.      sodium chloride 0.9 % SOLN 100 mL with imipenem-cilastatin 500 MG SOLR 500 mg   Inject 500 mg into the vein every 6 (six) hours. To be given until 05-11-12      thiamine 100 MG tablet   Commonly known as: VITAMIN B-1   Give 100 mg by tube every morning.      traMADol 50 MG tablet   Commonly known as: ULTRAM   Take 1 tablet (50 mg total) by mouth every 6 (six) hours as needed.      traZODone 25 mg Tabs   Commonly known as: DESYREL   25 mg by PEG Tube route at bedtime.      vancomycin 1 GM/200ML Soln  Commonly known as: VANCOCIN   Inject 200 mLs  (1,000 mg total) into the vein every 12 (twelve) hours. For 5 more days (until 05-11-12)      vitamin C 500 MG tablet   Commonly known as: ASCORBIC ACID   Give 500 mg by tube 2 (two) times daily.      zinc sulfate 220 MG capsule   220 mg by PEG Tube route daily.          The results of significant diagnostics from this hospitalization (including imaging, microbiology, ancillary and laboratory) are listed below for reference.    Significant Diagnostic Studies: Ct Abdomen Pelvis Wo Contrast  04/13/2012  *RADIOLOGY REPORT*  Clinical Data:  Questionable GI bleeding.  Mass.  Contrast was given through a J tube.  The patient is vomiting blood.  Hematuria. Low hemoglobin.  History of diabetes, stroke, colostomy, hernia repair  CT CHEST, ABDOMEN AND PELVIS WITHOUT CONTRAST  Technique:  Multidetector CT imaging of the chest, abdomen and pelvis was performed following the standard protocol without IV contrast.  Comparison:  CT of the abdomen and pelvis with contrast 02/12/2012  CT CHEST  Findings:  The patient has right-sided transvenous pacemaker with leads to the right atrium and right ventricle.  Heart size is normal.  There is no mediastinal, hilar, or axillary adenopathy. Small calcified nodules are identified within the lungs, consistent with calcified granulomata.  There are no focal consolidations or pleural effusions.  No suspicious lytic or blastic lesions are identified in the thoracic spine.  IMPRESSION: No evidence for acute abnormality of the chest.  CT ABDOMEN AND PELVIS  Findings:  The the patient has a gastrojejunostomy.  The jejunal tip is in appropriate position in the proximal jejunum.  A left lower quadrant ostomy and mucus fistula are unremarkable in appearance except for scattered colonic diverticula.  The patient has a left lower quadrant port for penile implant.  No focal abnormality identified within the liver, spleen, pancreas, or adrenal glands.  Nonobstructing intrarenal calculus  is identified in the left kidney, measuring 5 mm in diameter.  No evidence for ureteral stones.  The gallbladder is surgically absent.  No evidence for bowel dilatation or bowel wall thickening. The appendix is well seen and has a normal appearance.  No bowel obstruction.  Contrast reaches the colostomy bag prior to the procedure.  There continues to be significant soft tissue density surrounding the left sacroiliac joint, associated with bony erosion.  This appears to have progressed since prior study.  There is associated sclerosis in the SI joint.  Differential diagnosis includes infection or metastasis.  Urinary bladder has a normal appearance. No retroperitoneal or mesenteric adenopathy.  The abdominal aorta is calcified but not aneurysmal.  IMPRESSION:  1.  Gastrojejunostomy tube appears been good position. 2.  Postoperative changes. 3.  Left SI joint soft tissue and bony process as described may represent infectious process or neoplasm.  This appears to have progressed since prior study. 4.  Nonobstructing left intrarenal calculus.   Original Report Authenticated By: Norva Pavlov, M.D.    Ct Head W Wo Contrast  04/14/2012  *RADIOLOGY REPORT*  Clinical Data: Rule out mass.  Oropharyngeal bleeding.  CT HEAD WITHOUT AND WITH CONTRAST,CT NECK WITH CONTRAST  Technique:  Contiguous axial images were obtained from the base of the skull through the vertex without and with intravenous contrast.,Technique:  Multidetector CT imaging of the neck was performed with intravenous contrast.  Contrast: OMNIPAQUE IOHEXOL 300 MG/ML  SOLN  Comparison: 01/19/2011 unenhanced head CT.  Findings: New from the prior examination is left occipital hypodensity which does not enhance suggestive of infarct of indeterminate age although I suspect subacute to chronic in age.  Prominent small vessel disease type changes.  Global atrophy.  Ventricular prominence may be related to atrophy rather than hydrocephalus.  No intracranial  mass or abnormal enhancement.  No intracranial hemorrhage.  IMPRESSION: Since the prior CT, interval development of left occipital lobe infarct which I suspect is subacute to chronic in age.  Prominent small vessel disease type changes.  Atrophy with ventricular prominence without significant change.  CT NECK WITH CONTRAST  Technique:  Multidetector CT imaging of the neck was performed using the standard protocol following the bolus administration of intravenous contrast.  Contrast: OMNIPAQUE IOHEXOL 300 MG/ML  SOLN  Comparison:   None  Findings: Long segment anterior corpectomy extending from the inferior aspect of C2 to the superior aspect of C7.  Anterior plate and screws in place at the superior inferior margin.  The inferior anterior screw traverses through the anterior inferior margin of C7 into the C7-T1 disc space.  Prior complex posterior fusion from CT to T2.  The right C4, C5 and C6 pedicle screws extends into the right transverse foramen bordering the vertebral artery.  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  The exam was not optimized to evaluate the fusion.  Dual lead pacemaker enters from the right.  Minimal asymmetry posterior-superior nasopharynx without discrete mass or secondary findings of eustachian tube dysfunction.  Slight distortion of the posterior pharynx by the fusion.  Secretions within the left piriform sinus.  No discrete worrisome neck mass identified.  Distortion of the glottic region with tracheal cartilage tilted to the right.  No neck adenopathy.  Atherosclerotic type changes carotid bifurcation bilaterally. Degree of stenosis of the internal carotid artery not adequately assessed on the present exam.  IMPRESSION: No neck mass identified as cause of patient's bleeding.  Prior neck fusion as detailed above.  The present exam was not optimized to evaluate this effusion.   Plaque carotid bifurcation with narrowing. Degree of stenosis not adequately assessed on the present exam.   Original Report Authenticated By: Lacy Duverney, M.D.    Ct Soft Tissue Neck W Contrast  04/14/2012  *RADIOLOGY REPORT*  Clinical Data: Rule out mass.  Oropharyngeal bleeding.  CT HEAD WITHOUT AND WITH CONTRAST,CT NECK WITH CONTRAST  Technique:  Contiguous axial images were obtained from the base of the skull through the vertex without and with intravenous contrast.,Technique:  Multidetector CT imaging of the neck was performed with intravenous contrast.  Contrast: OMNIPAQUE IOHEXOL 300 MG/ML  SOLN  Comparison: 01/19/2011 unenhanced head CT.  Findings: New from the prior examination is left occipital hypodensity which does not enhance suggestive of infarct of indeterminate age although I suspect subacute to chronic in age.  Prominent small vessel disease type changes.  Global atrophy.  Ventricular prominence may be related to atrophy rather than hydrocephalus.  No intracranial mass or abnormal enhancement.  No intracranial hemorrhage.  IMPRESSION: Since the prior CT, interval development of left occipital lobe infarct which I suspect is subacute to chronic in age.  Prominent small vessel disease type changes.  Atrophy with ventricular prominence without significant change.  CT NECK WITH CONTRAST  Technique:  Multidetector CT imaging of the neck was performed using the standard protocol following the bolus administration of intravenous contrast.  Contrast: OMNIPAQUE IOHEXOL 300 MG/ML  SOLN  Comparison:   None  Findings: Long segment anterior corpectomy extending from the inferior aspect of C2 to the superior aspect of C7.  Anterior plate and screws in place at the superior inferior margin.  The inferior anterior screw traverses through the anterior inferior margin of C7 into the C7-T1 disc space.  Prior complex posterior fusion from CT to T2.  The right C4, C5 and C6 pedicle screws extends into  the right transverse foramen bordering the vertebral artery.  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  The exam was not optimized to evaluate the fusion.  Dual lead pacemaker enters from the right.  Minimal asymmetry posterior-superior nasopharynx without discrete mass or secondary findings of eustachian tube dysfunction.  Slight distortion of the posterior pharynx by the fusion.  Secretions within the left piriform sinus.  No discrete worrisome neck mass identified.  Distortion of the glottic region with tracheal cartilage tilted to the right.  No neck adenopathy.  Atherosclerotic type changes carotid bifurcation bilaterally. Degree of stenosis of the internal carotid artery not adequately assessed on the present exam.  IMPRESSION: No neck mass identified as cause of patient's bleeding.  Prior neck fusion as detailed above.  The present exam was not optimized to evaluate this effusion.  Plaque carotid bifurcation with narrowing. Degree of stenosis not adequately assessed on the present exam.   Original Report Authenticated By: Lacy Duverney, M.D.    Ct Chest Wo Contrast  04/13/2012  *RADIOLOGY REPORT*  Clinical Data:  Questionable GI bleeding.  Mass.  Contrast was given through a J tube.  The patient is vomiting blood.  Hematuria. Low hemoglobin.  History of diabetes, stroke, colostomy, hernia repair  CT CHEST, ABDOMEN AND PELVIS WITHOUT CONTRAST  Technique:  Multidetector CT imaging of the chest, abdomen and pelvis was performed following the standard protocol without IV contrast.  Comparison:  CT of the abdomen and pelvis with contrast 02/12/2012  CT CHEST  Findings:  The patient has right-sided transvenous pacemaker with leads to the right atrium and right ventricle.  Heart size is normal.  There is no mediastinal, hilar, or axillary adenopathy. Small calcified nodules are identified within the lungs,  consistent with calcified granulomata.  There are no focal consolidations or pleural effusions.  No suspicious lytic or blastic lesions are identified in the thoracic spine.  IMPRESSION: No evidence for acute abnormality of the chest.  CT ABDOMEN AND PELVIS  Findings:  The the patient has a gastrojejunostomy.  The jejunal tip is in appropriate position in the proximal jejunum.  A left lower quadrant ostomy and mucus fistula are unremarkable in appearance except for scattered colonic diverticula.  The patient has a left lower quadrant port for penile implant.  No focal abnormality identified within the liver, spleen, pancreas, or adrenal glands.  Nonobstructing intrarenal calculus is identified in the left kidney, measuring 5 mm in diameter.  No evidence for ureteral stones.  The gallbladder is surgically absent.  No evidence for bowel dilatation or bowel wall thickening. The appendix is well seen and has a normal appearance.  No bowel obstruction.  Contrast reaches the colostomy bag prior to the procedure.  There continues to be significant soft tissue density surrounding the left sacroiliac joint, associated with bony erosion.  This appears to have progressed since prior study.  There is associated sclerosis in the SI joint.  Differential diagnosis includes infection or  metastasis.  Urinary bladder has a normal appearance. No retroperitoneal or mesenteric adenopathy.  The abdominal aorta is calcified but not aneurysmal.  IMPRESSION:  1.  Gastrojejunostomy tube appears been good position. 2.  Postoperative changes. 3.  Left SI joint soft tissue and bony process as described may represent infectious process or neoplasm.  This appears to have progressed since prior study. 4.  Nonobstructing left intrarenal calculus.   Original Report Authenticated By: Norva Pavlov, M.D.    Ct Abdomen Pelvis W Contrast  04/30/2012  *RADIOLOGY REPORT*  Clinical Data: Sepsis.  Abdominal pain and nausea.  Vomiting. Fever.  CT  ABDOMEN AND PELVIS WITH CONTRAST  Technique:  Multidetector CT imaging of the abdomen and pelvis was performed following the standard protocol during bolus administration of intravenous contrast.  Contrast: OMNIPAQUE IOHEXOL 300 MG/ML  SOLN  Comparison: CT of the abdomen and pelvis 04/13/2012.  Findings: Bilateral lower lobe airspace disease progressed, worrisome for pneumonia.  The heart size is normal.  Pacing wires are in place.  No significant pleural or pericardial effusion is present.  The liver and spleen are within normal limits.  The gastrojejunostomy tube is in place.  The tip is at the ligament of Treitz.  The pancreas is mildly atrophic but within normal limits. The common bile duct is within normal limits following cholecystectomy.  Adrenal glands are normal bilaterally.  A 6 mm posterior nonobstructing stone is present in the left kidney. There are several sub-centimeter hypodense lesions bilaterally, likely representing simple cysts.  Slight ectasia of the abdominal aorta is noted.  There is no aneurysm.  A Foley catheter is present within the collapsed urinary bladder. Penile prosthesis is in place.  A colostomy and mucous fistula is present in the left lower quadrant.  The bowel is otherwise unremarkable.  No significant adenopathy or free fluid is present.  A lytic lesion in the left iliac bone, adjacent to the SI joint is slightly more prominent than on the prior exam.  IMPRESSION:  1.  No acute or focal abnormality to explain the patient's symptoms. 2.  Nonobstructing left renal stone is stable. 3.  Status post cholecystectomy. 4.  Satisfactory positioning of the gastrojejunostomy tube. 5.  Left lower quadrant colectomy without evidence for complication. 6.  Atherosclerosis. 7.  New onset of bilateral lower lobe airspace disease is concerning for pneumonia.   Original Report Authenticated By: Marin Roberts, M.D.    Dg Chest Port 1 View  05/06/2012  *RADIOLOGY REPORT*  Clinical  Data: Central catheter placement  PORTABLE CHEST - 1 VIEW  Comparison: April 30, 2012  Findings:  Central catheter tip is in the superior vena cava.  No pneumothorax.  Pacemaker leads remain attached to the right heart.  There is atelectatic change in the left lower lobe. There is a small left pleural effusion.  Lungs are otherwise clear.  Heart is mildly enlarged with normal pulmonary vascularity, stable.  There is postoperative change in the cervical spine.  IMPRESSION: Central catheter with tip in superior vena cava.  No pneumothorax. Left lower lobe atelectatic change.  There is a new small effusion in this area as well.   Original Report Authenticated By: Bretta Bang, M.D.    Dg Chest Portable 1 View  04/30/2012  *RADIOLOGY REPORT*  Clinical Data: Emesis, cough and wheezing.  PORTABLE CHEST - 1 VIEW  Comparison: CT chest 04/[birth year]/1938 chest radiograph 02/12/2012.  Findings: Trachea is midline.  Heart size stable.  Pacemaker lead tips project over the right atrium  and right ventricle.  Lungs are somewhat low in volume with central pulmonary vascular congestion. Lingular subsegmental atelectasis.  No definite pleural fluid.  IMPRESSION: Central pulmonary vascular congestion without overt air space consolidation or edema.   Original Report Authenticated By: Leanna Battles, M.D.     Microbiology: Recent Results (from the past 240 hour(s))  CULTURE, BLOOD (ROUTINE X 2)     Status: Normal (Preliminary result)   Collection Time   04/30/12  5:50 PM      Component Value Range Status Comment   Specimen Description BLOOD RIGHT HAND   Final    Special Requests BOTTLES DRAWN AEROBIC AND ANAEROBIC 10CC   Final    Culture  Setup Time 05/01/2012 00:21   Final    Culture     Final    Value:        BLOOD CULTURE RECEIVED NO GROWTH TO DATE CULTURE WILL BE HELD FOR 5 DAYS BEFORE ISSUING A FINAL NEGATIVE REPORT   Report Status PENDING   Incomplete   CULTURE, BLOOD (ROUTINE X 2)     Status: Normal (Preliminary  result)   Collection Time   04/30/12  6:00 PM      Component Value Range Status Comment   Specimen Description BLOOD LEFT HAND   Final    Special Requests BOTTLES DRAWN AEROBIC AND ANAEROBIC 10CC   Final    Culture  Setup Time 05/01/2012 00:21   Final    Culture     Final    Value:        BLOOD CULTURE RECEIVED NO GROWTH TO DATE CULTURE WILL BE HELD FOR 5 DAYS BEFORE ISSUING A FINAL NEGATIVE REPORT   Report Status PENDING   Incomplete   CLOSTRIDIUM DIFFICILE BY PCR     Status: Abnormal   Collection Time   04/30/12  6:27 PM      Component Value Range Status Comment   C difficile by pcr POSITIVE (*) NEGATIVE Final   URINE CULTURE     Status: Normal   Collection Time   04/30/12  7:44 PM      Component Value Range Status Comment   Specimen Description URINE, CATHETERIZED   Final    Special Requests NONE   Final    Culture  Setup Time 04/30/2012 20:52   Final    Colony Count >=100,000 COLONIES/ML   Final    Culture     Final    Value: KLEBSIELLA PNEUMONIAE     Note: Confirmed Extended Spectrum Beta-Lactamase Producer (ESBL)   Report Status 05/02/2012 FINAL   Final    Organism ID, Bacteria KLEBSIELLA PNEUMONIAE   Final   MRSA PCR SCREENING     Status: Abnormal   Collection Time   05/01/12  3:39 AM      Component Value Range Status Comment   MRSA by PCR POSITIVE (*) NEGATIVE Final      Labs: Basic Metabolic Panel:  Lab 05/06/12 1610 05/05/12 1050 05/03/12 0916 05/02/12 0455 04/30/12 1747  NA 138 137 139 144 137  K 3.5 2.8* 3.6 3.5 4.6  CL 108 109 108 111 97  CO2 17* 15* 20 21 23   GLUCOSE 143* 126* 104* 98 349*  BUN 14 15 16  31* 54*  CREATININE 0.77 0.75 0.83 0.99 0.97  CALCIUM 8.9 8.4 8.9 8.6 9.9  MG -- -- -- -- --  PHOS -- -- -- -- --   Liver Function Tests:  Lab 05/02/12 0455 04/30/12 1747  AST 27 27  ALT 19 23  ALKPHOS 95 115  BILITOT 0.3 0.4  PROT 7.5 9.4*  ALBUMIN 1.9* 2.2*   CBC:  Lab 05/06/12 0520 05/05/12 1050 05/03/12 0916 05/02/12 0455 05/01/12 0645  04/30/12 1747  WBC 4.4 4.3 4.8 4.3 8.4 --  NEUTROABS -- -- -- 2.8 -- 11.6*  HGB 8.9* 8.3* 7.7* 7.7* 8.5* --  HCT 28.1* 27.1* 24.9* 24.7* 26.9* --  MCV 76.8* 77.0* 77.8* 77.7* 76.2* --  PLT 307 283 300 291 344 --   Cardiac Enzymes:  Lab 04/30/12 1805  CKTOTAL --  CKMB --  CKMBINDEX --  TROPONINI <0.30   CBG:  Lab 05/06/12 1220 05/06/12 0744 05/06/12 0403 05/05/12 2354 05/05/12 1954  GLUCAP 157* 143* 130* 140* 117*     Signed:  Tyquarius Paglia  [facility] 05/06/2012, 2:23 PM

## 2012-05-07 ENCOUNTER — Other Ambulatory Visit (HOSPITAL_COMMUNITY): Payer: Self-pay | Admitting: Interventional Radiology

## 2012-05-07 ENCOUNTER — Ambulatory Visit (HOSPITAL_COMMUNITY)
Admission: RE | Admit: 2012-05-07 | Discharge: 2012-05-07 | Disposition: A | Discharge status: Home / Self Care | Payer: Medicare Other | Source: Ambulatory Visit | Attending: Interventional Radiology | Admitting: Interventional Radiology

## 2012-05-07 DIAGNOSIS — B9689 Other specified bacterial agents as the cause of diseases classified elsewhere: Secondary | ICD-10-CM

## 2012-05-07 DIAGNOSIS — Z452 Encounter for adjustment and management of vascular access device: Secondary | ICD-10-CM | POA: Insufficient documentation

## 2012-05-07 LAB — CULTURE, BLOOD (ROUTINE X 2): Culture: NO GROWTH

## 2012-05-07 MED ORDER — CHLORHEXIDINE GLUCONATE 4 % EX LIQD
CUTANEOUS | Status: AC
  Filled 2012-05-07: qty 15

## 2012-05-07 MED ORDER — CHLORHEXIDINE GLUCONATE 4 % EX LIQD
CUTANEOUS | Status: AC
  Filled 2012-05-07: qty 30

## 2012-05-07 NOTE — Procedures (Signed)
Patient with existing PICC placed by IV team which is not functioning properly   Received telephone consent from his spouse.  Procedure : exchange of SL left arm PICC measuring 49 cm in length with tip at cavo-atrial junction ready for immediate use. Catheter flushes and aspirates without difficulty.  Complications : none immediate

## 2012-05-09 ENCOUNTER — Encounter: Payer: Self-pay | Admitting: *Deleted

## 2012-06-22 ENCOUNTER — Emergency Department (HOSPITAL_COMMUNITY): Payer: Medicare Other

## 2012-06-22 ENCOUNTER — Emergency Department (HOSPITAL_COMMUNITY)
Admission: EM | Admit: 2012-06-22 | Discharge: 2012-06-22 | Disposition: A | Discharge status: Home / Self Care | Payer: Medicare Other | Attending: Emergency Medicine | Admitting: Emergency Medicine

## 2012-06-22 DIAGNOSIS — W06XXXA Fall from bed, initial encounter: Secondary | ICD-10-CM | POA: Insufficient documentation

## 2012-06-22 DIAGNOSIS — Z794 Long term (current) use of insulin: Secondary | ICD-10-CM | POA: Insufficient documentation

## 2012-06-22 DIAGNOSIS — Y939 Activity, unspecified: Secondary | ICD-10-CM | POA: Insufficient documentation

## 2012-06-22 DIAGNOSIS — E119 Type 2 diabetes mellitus without complications: Secondary | ICD-10-CM | POA: Insufficient documentation

## 2012-06-22 DIAGNOSIS — Z8673 Personal history of transient ischemic attack (TIA), and cerebral infarction without residual deficits: Secondary | ICD-10-CM | POA: Insufficient documentation

## 2012-06-22 DIAGNOSIS — Z872 Personal history of diseases of the skin and subcutaneous tissue: Secondary | ICD-10-CM | POA: Insufficient documentation

## 2012-06-22 DIAGNOSIS — D649 Anemia, unspecified: Secondary | ICD-10-CM | POA: Insufficient documentation

## 2012-06-22 DIAGNOSIS — K219 Gastro-esophageal reflux disease without esophagitis: Secondary | ICD-10-CM | POA: Insufficient documentation

## 2012-06-22 DIAGNOSIS — W19XXXA Unspecified fall, initial encounter: Secondary | ICD-10-CM

## 2012-06-22 DIAGNOSIS — Z79899 Other long term (current) drug therapy: Secondary | ICD-10-CM | POA: Insufficient documentation

## 2012-06-22 DIAGNOSIS — S0990XA Unspecified injury of head, initial encounter: Secondary | ICD-10-CM | POA: Insufficient documentation

## 2012-06-22 DIAGNOSIS — Z8619 Personal history of other infectious and parasitic diseases: Secondary | ICD-10-CM | POA: Insufficient documentation

## 2012-06-22 DIAGNOSIS — E039 Hypothyroidism, unspecified: Secondary | ICD-10-CM | POA: Insufficient documentation

## 2012-06-22 DIAGNOSIS — I10 Essential (primary) hypertension: Secondary | ICD-10-CM | POA: Insufficient documentation

## 2012-06-22 DIAGNOSIS — Y929 Unspecified place or not applicable: Secondary | ICD-10-CM | POA: Insufficient documentation

## 2012-06-22 LAB — URINE MICROSCOPIC-ADD ON

## 2012-06-22 LAB — URINALYSIS, ROUTINE W REFLEX MICROSCOPIC
Bilirubin Urine: NEGATIVE
Hgb urine dipstick: NEGATIVE
Nitrite: NEGATIVE
Specific Gravity, Urine: 1.024 (ref 1.005–1.030)
Urobilinogen, UA: 1 mg/dL (ref 0.0–1.0)
pH: 8.5 — ABNORMAL HIGH (ref 5.0–8.0)

## 2012-06-22 LAB — BASIC METABOLIC PANEL
CO2: 24 mEq/L (ref 19–32)
Calcium: 9.2 mg/dL (ref 8.4–10.5)
Chloride: 102 mEq/L (ref 96–112)
Glucose, Bld: 131 mg/dL — ABNORMAL HIGH (ref 70–99)
Potassium: 4 mEq/L (ref 3.5–5.1)
Sodium: 134 mEq/L — ABNORMAL LOW (ref 135–145)

## 2012-06-22 LAB — CBC
Hemoglobin: 10.9 g/dL — ABNORMAL LOW (ref 13.0–17.0)
MCH: 26.1 pg (ref 26.0–34.0)
Platelets: 303 10*3/uL (ref 150–400)
RBC: 4.18 MIL/uL — ABNORMAL LOW (ref 4.22–5.81)
WBC: 5.9 10*3/uL (ref 4.0–10.5)

## 2012-06-22 LAB — GLUCOSE, CAPILLARY: Glucose-Capillary: 125 mg/dL — ABNORMAL HIGH (ref 70–99)

## 2012-06-22 NOTE — ED Notes (Signed)
Patient denies pain and is resting comfortably.  

## 2012-06-22 NOTE — ED Notes (Signed)
Patient family at bedside. Family states they are concerned patient may be "infected with something". Family states patient baseline behaviors are altered. Family states patient's mouth movements are not "normal" except when he is in the hospital setting.

## 2012-06-22 NOTE — ED Provider Notes (Signed)
History     CSN: [id number]  Arrival date & time 06/22/12  8469   First MD Initiated Contact with Patient 06/22/12 [phone]      Chief Complaint  Patient presents with  . Fall     HPI The patient was brought to the emergency department from a nursing home after an unwitnessed fall.  The patient was found with his head on the floor in his feet on the bed.  No reported vomiting.  The patient has no complaints at this time.  History of right arm weakness secondary to CVA.  Sacral ulcer in bilateral air bruits noted lower [place] is.  Foley, colostomy, PEG tube.  Patient without significant complaints.   Past Medical History  Diagnosis Date  . Stroke     Hx of with right hemiparesis  . Hypertension   . Endocarditis, mitral valve, syphilitic   . Hypothyroidism   . Anorexia   . Dysphagia   . Reflux   . Bacteremia   . Clostridium difficile colitis   . CVA (cerebral vascular accident)   . Pressure ulcer(707.0)   . Diabetes mellitus, type 2   . Anemia   . Stroke     Past Surgical History  Procedure Laterality Date  . Colostomy    . Peg placement    . Hernia repair      Family History  Problem Relation Age of Onset  . Cancer Mother   . Other Father     old age after a fall  . Alzheimer's disease Sister     History  Substance Use Topics  . Smoking status: Never Smoker   . Smokeless tobacco: Not on file  . Alcohol Use: No      Review of Systems  All other systems reviewed and are negative.    Allergies  Review of patient's allergies indicates no known allergies.  Home Medications   Current Outpatient Rx  Name  Route  Sig  Dispense  Refill  . citalopram (CELEXA) 10 MG tablet   PEG Tube   10 mg by PEG Tube route every morning.          . feeding supplement (PRO-STAT SUGAR FREE 64) LIQD   Per Tube   Place 30 mLs into feeding tube 2 (two) times daily.   900 mL      . ferrous sulfate 300 (60 FE) MG/5ML syrup   Tube   Give 300 mg by tube 2 (two) times  daily.           Marland Kitchen gabapentin (NEURONTIN) 300 MG capsule   Tube   Give 300 mg by tube at bedtime.          Marland Kitchen glycopyrrolate (ROBINUL) 1 MG tablet   PEG Tube   1 mg by PEG Tube route 3 (three) times daily.         . insulin aspart (NOVOLOG) 100 UNIT/ML injection   Subcutaneous   Inject 5 Units into the skin 3 (three) times daily before meals. For cbg > 250         . insulin glargine (LANTUS) 100 UNIT/ML injection   Subcutaneous   Inject 20 Units into the skin at bedtime.         Marland Kitchen levothyroxine (SYNTHROID, LEVOTHROID) 125 MCG tablet   Oral   Take 125 mcg by mouth every morning.         Marland Kitchen liothyronine (CYTOMEL) 25 MCG tablet   PEG Tube   25 mcg by PEG  Tube route every morning.          . metoprolol tartrate (LOPRESSOR) 25 MG tablet   Tube   Give 25 mg by tube 2 (two) times daily.          . Multiple Vitamins-Minerals (MULTIVITAMINS THER. W/MINERALS) TABS   PEG Tube   1 tablet by PEG Tube route every morning.          . Nutritional Supplements (FEEDING SUPPLEMENT, GLUCERNA 1.2 CAL,) LIQD   Per Tube   Place 1,000 mLs into feeding tube continuous. From 9pm-5am         . potassium chloride 20 MEQ/15ML (10%) solution   Tube   Give 20 mEq by tube every morning.          . ranitidine (ZANTAC) 75 MG tablet   Tube   Give 150 mg by tube 2 (two) times daily.           Marland Kitchen scopolamine (TRANSDERM-SCOP) 1.5 MG   Transdermal   Place 1 patch onto the skin every 3 (three) days.         . Thiamine HCl (VITAMIN B-1) 100 MG tablet   PEG Tube   100 mg by PEG Tube route every morning.          . traMADol (ULTRAM) 50 MG tablet   PEG Tube   50 mg by PEG Tube route every 6 (six) hours as needed for pain.         . traZODone (DESYREL) 50 MG tablet   Oral   Take 25 mg by mouth at bedtime. For insomnia         . vitamin C (ASCORBIC ACID) 500 MG tablet   Tube   Give 500 mg by tube 2 (two) times daily.          Marland Kitchen zinc sulfate 220 MG capsule   PEG  Tube   220 mg by PEG Tube route every morning.            BP 109/63  Pulse 78  Temp(Src) 97.9 F (36.6 C) (Oral)  Resp 18  SpO2 100%  Physical Exam  Nursing note and vitals reviewed. Constitutional: He is oriented to person, place, and time. He appears well-developed and well-nourished.  HENT:  Head: Normocephalic and atraumatic.  No scalp hematomas or lacerations noted  Eyes: EOM are normal.  Neck: Neck supple.  Mild cervical spine tenderness without step-off.  Also with paracervical tenderness  Cardiovascular: Normal rate, regular rhythm, normal heart sounds and intact distal pulses.   Pulmonary/Chest: Effort normal and breath sounds normal. No respiratory distress.  Abdominal: Soft. He exhibits no distension. There is no tenderness.  PEG tube in place without secondary signs of infection.  Colostomy in place in left lower quadrant with normal colored stool and air in his colostomy bag.  No surrounding infectious signs around the colostomy  Musculoskeletal: He exhibits no tenderness.  Chronic contractures of bilateral lower extremities.  Full range of motion of bilateral hips without pain.  Full range of motion of knees   Neurological: He is alert and oriented to person, place, and time.  Skin: Skin is warm and dry.  Psychiatric: He has a normal mood and affect. Judgment normal.    ED Course  Procedures (including critical care time)  Labs Reviewed  GLUCOSE, CAPILLARY - Abnormal; Notable for the following:    Glucose-Capillary 111 (*)    All other components within normal limits  URINALYSIS, ROUTINE W REFLEX MICROSCOPIC -  Abnormal; Notable for the following:    APPearance TURBID (*)    pH 8.5 (*)    Protein, ur >300 (*)    Leukocytes, UA LARGE (*)    All other components within normal limits  URINE MICROSCOPIC-ADD ON - Abnormal; Notable for the following:    Bacteria, UA MANY (*)    Crystals TRIPLE PHOSPHATE CRYSTALS (*)    All other components within normal limits   CBC - Abnormal; Notable for the following:    RBC 4.18 (*)    Hemoglobin 10.9 (*)    HCT 33.6 (*)    RDW 18.8 (*)    All other components within normal limits  BASIC METABOLIC PANEL - Abnormal; Notable for the following:    Sodium 134 (*)    Glucose, Bld 131 (*)    BUN 24 (*)    GFR calc non Af Amer 70 (*)    GFR calc Af Amer 81 (*)    All other components within normal limits  URINE CULTURE   Dg Pelvis 1-2 Views  06/22/2012  *RADIOLOGY REPORT*  Clinical Data: Status post fall; bilateral hip pain.  PELVIS - 1-2 VIEW  Comparison: CT of the abdomen and pelvis performed 04/30/2012  Findings: There is no evidence of fracture or dislocation.  Both femoral heads are seated normally within their respective acetabula.  Minimal degenerative change is noted at both hips.  The sacroiliac joints are unremarkable in appearance.  The visualized bowel gas pattern is grossly unremarkable in appearance.  Scattered clips are noted along both pelvic sidewalls. A penile prosthesis is noted.  IMPRESSION: No evidence of fracture or dislocation.   Original Report Authenticated By: Tonia Ghent, M.D.    Ct Head Wo Contrast  06/22/2012  *RADIOLOGY REPORT*  Clinical Data:   Unwitnessed fall.  Prior CVA.  CT HEAD WITHOUT CONTRAST  Technique: Contiguous axial images were obtained from the base of the skull through the vertex without contrast  Comparison: 04/14/2012  Findings:  Bone windows demonstrate right maxillary sinus mucous retention cyst or polyp. No significant soft tissue swelling. Ethmoid air cell mucosal thickening. Clear mastoid air cells.  Soft tissue windows demonstrate moderate low density in the periventricular white matter likely related to small vessel disease.  Advanced cerebral atrophy.  Left occipital lobe remote infarct. No  mass lesion, hemorrhage, hydrocephalus, acute infarct, intra-axial, or extra-axial fluid collection.  IMPRESSION:  1.  No acute or post-traumatic deformity. 2. Cerebral atrophy and  small vessel ischemic change. 3.  Remote left occipital lobe infarct. 4.  Sinus disease.  CT CERVICAL SPINE WITHOUT CONTRAST  Technique: Continous axial images were obtained of the cervical spine without contrast.  Sagittal and coronal reformats were constructed.  Findings:  Spinal visualization through bottom of T1. Prevertebral soft tissues grossly within normal limits. Diminutive thyroid gland. No apical pneumothorax.  Probable secretions in the dependent trachea on image 89/series 5. Extensive surgical changes with posterior trans pedicle screw fixation from C2 on the left to at least T2.  Partial right-sided corpectomy with graft material from C2-C7.  A left-sided screw at C7 extends below the inferior endplate.  Remaining vertebral body height is grossly maintained.  Underlying severe facet arthropathy. No acute hardware complication identified.  Anterior plate fixation at C2-C3.  C1-C2 articulation unremarkable.  Anterior plate and screw fixation at C6-C7 as well.  IMPRESSION: Extensive surgical changes throughout the cervical spine.  No gross acute injury identified.   Original Report Authenticated By: Jeronimo Greaves, M.D.  Ct Cervical Spine Wo Contrast  06/22/2012  *RADIOLOGY REPORT*  Clinical Data:   Unwitnessed fall.  Prior CVA.  CT HEAD WITHOUT CONTRAST  Technique: Contiguous axial images were obtained from the base of the skull through the vertex without contrast  Comparison: 04/14/2012  Findings:  Bone windows demonstrate right maxillary sinus mucous retention cyst or polyp. No significant soft tissue swelling. Ethmoid air cell mucosal thickening. Clear mastoid air cells.  Soft tissue windows demonstrate moderate low density in the periventricular white matter likely related to small vessel disease.  Advanced cerebral atrophy.  Left occipital lobe remote infarct. No  mass lesion, hemorrhage, hydrocephalus, acute infarct, intra-axial, or extra-axial fluid collection.  IMPRESSION:  1.  No acute or  post-traumatic deformity. 2. Cerebral atrophy and small vessel ischemic change. 3.  Remote left occipital lobe infarct. 4.  Sinus disease.  CT CERVICAL SPINE WITHOUT CONTRAST  Technique: Continous axial images were obtained of the cervical spine without contrast.  Sagittal and coronal reformats were constructed.  Findings:  Spinal visualization through bottom of T1. Prevertebral soft tissues grossly within normal limits. Diminutive thyroid gland. No apical pneumothorax.  Probable secretions in the dependent trachea on image 89/series 5. Extensive surgical changes with posterior trans pedicle screw fixation from C2 on the left to at least T2.  Partial right-sided corpectomy with graft material from C2-C7.  A left-sided screw at C7 extends below the inferior endplate.  Remaining vertebral body height is grossly maintained.  Underlying severe facet arthropathy. No acute hardware complication identified.  Anterior plate fixation at C2-C3.  C1-C2 articulation unremarkable.  Anterior plate and screw fixation at C6-C7 as well.  IMPRESSION: Extensive surgical changes throughout the cervical spine.  No gross acute injury identified.   Original Report Authenticated By: Jeronimo Greaves, M.D.    I personally reviewed the imaging tests through PACS system I reviewed available ER/hospitalization records through the EMR   1. Fall, initial encounter   2. Minor head injury, initial encounter       MDM  Minor head injury.  CT head and C-spine are without acute abnormality.  Labs without significant abnormality.  Discharge back to his facility.  Full range of motion bilateral hips.        Lyanne Co, MD 06/22/12 [id number]

## 2012-06-22 NOTE — ED Notes (Signed)
Pt transported via EMS from [facility] after unwitnessed fall from bed. Pt found with feet on bed and head on ground, denies LOC, A & O. Denies neck/back pain. R arm contracted from previous CVA. Pressure sore noted to sacrum, air boots noted to bilat lower extremities. Foley and colostomy bag in place on arrival. Facial tick noted, baseline per NH staff.

## 2012-06-22 NOTE — ED Notes (Signed)
BS 125

## 2012-06-22 NOTE — ED Notes (Addendum)
Corney Knighton (wife) [phone] or [phone]; Mirza Fessel (son) [phone]. Password "[email or web address]

## 2012-06-22 NOTE — ED Notes (Signed)
MD at bedside. 

## 2012-06-24 LAB — URINE CULTURE

## 2012-06-29 ENCOUNTER — Telehealth (HOSPITAL_COMMUNITY): Payer: Self-pay | Admitting: Emergency Medicine

## 2012-06-29 NOTE — ED Notes (Signed)
Chart returned from EDP office. Per Tiffany Greene PA-C, Ampicillin 500 mg tab. 500 mg PO QID for 7 days. Disp #28. °

## 2012-09-01 ENCOUNTER — Other Ambulatory Visit: Payer: Self-pay | Admitting: Internal Medicine

## 2012-09-01 ENCOUNTER — Ambulatory Visit (HOSPITAL_COMMUNITY)
Admission: RE | Admit: 2012-09-01 | Discharge: 2012-09-01 | Disposition: A | Discharge status: Home / Self Care | Payer: Medicare Other | Source: Ambulatory Visit | Attending: Internal Medicine | Admitting: Internal Medicine

## 2012-09-01 DIAGNOSIS — R633 Feeding difficulties: Secondary | ICD-10-CM

## 2012-09-01 DIAGNOSIS — Z434 Encounter for attention to other artificial openings of digestive tract: Secondary | ICD-10-CM | POA: Insufficient documentation

## 2012-09-01 MED ORDER — IOHEXOL 300 MG/ML  SOLN
50.0000 mL | Freq: Once | INTRAMUSCULAR | Status: AC | PRN
  Administered 2012-09-01: 20 mL via INTRAVENOUS

## 2012-09-12 ENCOUNTER — Other Ambulatory Visit: Payer: Self-pay | Admitting: Internal Medicine

## 2012-09-12 DIAGNOSIS — R633 Feeding difficulties: Secondary | ICD-10-CM

## 2012-09-13 ENCOUNTER — Inpatient Hospital Stay (HOSPITAL_COMMUNITY)
Admission: EM | Admit: 2012-09-13 | Discharge: 2012-09-23 | DRG: 871 | Disposition: A | Discharge status: Skilled Nursing Facility | Payer: Medicare Other | Attending: Internal Medicine | Admitting: Internal Medicine

## 2012-09-13 ENCOUNTER — Encounter (HOSPITAL_COMMUNITY): Payer: Self-pay | Admitting: Emergency Medicine

## 2012-09-13 ENCOUNTER — Emergency Department (HOSPITAL_COMMUNITY): Payer: Medicare Other

## 2012-09-13 DIAGNOSIS — N179 Acute kidney failure, unspecified: Secondary | ICD-10-CM

## 2012-09-13 DIAGNOSIS — L89314 Pressure ulcer of right buttock, stage 4: Secondary | ICD-10-CM

## 2012-09-13 DIAGNOSIS — M86659 Other chronic osteomyelitis, unspecified thigh: Secondary | ICD-10-CM | POA: Diagnosis present

## 2012-09-13 DIAGNOSIS — Z954 Presence of other heart-valve replacement: Secondary | ICD-10-CM

## 2012-09-13 DIAGNOSIS — L89304 Pressure ulcer of unspecified buttock, stage 4: Secondary | ICD-10-CM

## 2012-09-13 DIAGNOSIS — Z933 Colostomy status: Secondary | ICD-10-CM

## 2012-09-13 DIAGNOSIS — T50995A Adverse effect of other drugs, medicaments and biological substances, initial encounter: Secondary | ICD-10-CM | POA: Diagnosis present

## 2012-09-13 DIAGNOSIS — E872 Acidosis, unspecified: Secondary | ICD-10-CM

## 2012-09-13 DIAGNOSIS — R112 Nausea with vomiting, unspecified: Secondary | ICD-10-CM

## 2012-09-13 DIAGNOSIS — I38 Endocarditis, valve unspecified: Secondary | ICD-10-CM

## 2012-09-13 DIAGNOSIS — I10 Essential (primary) hypertension: Secondary | ICD-10-CM

## 2012-09-13 DIAGNOSIS — G9341 Metabolic encephalopathy: Secondary | ICD-10-CM

## 2012-09-13 DIAGNOSIS — J189 Pneumonia, unspecified organism: Secondary | ICD-10-CM

## 2012-09-13 DIAGNOSIS — N390 Urinary tract infection, site not specified: Secondary | ICD-10-CM

## 2012-09-13 DIAGNOSIS — A4159 Other Gram-negative sepsis: Principal | ICD-10-CM | POA: Diagnosis present

## 2012-09-13 DIAGNOSIS — R509 Fever, unspecified: Secondary | ICD-10-CM

## 2012-09-13 DIAGNOSIS — R4182 Altered mental status, unspecified: Secondary | ICD-10-CM

## 2012-09-13 DIAGNOSIS — R6510 Systemic inflammatory response syndrome (SIRS) of non-infectious origin without acute organ dysfunction: Secondary | ICD-10-CM

## 2012-09-13 DIAGNOSIS — E860 Dehydration: Secondary | ICD-10-CM

## 2012-09-13 DIAGNOSIS — A0472 Enterocolitis due to Clostridium difficile, not specified as recurrent: Secondary | ICD-10-CM

## 2012-09-13 DIAGNOSIS — Z950 Presence of cardiac pacemaker: Secondary | ICD-10-CM

## 2012-09-13 DIAGNOSIS — A419 Sepsis, unspecified organism: Secondary | ICD-10-CM

## 2012-09-13 DIAGNOSIS — D649 Anemia, unspecified: Secondary | ICD-10-CM

## 2012-09-13 DIAGNOSIS — J9601 Acute respiratory failure with hypoxia: Secondary | ICD-10-CM

## 2012-09-13 DIAGNOSIS — F19921 Other psychoactive substance use, unspecified with intoxication with delirium: Secondary | ICD-10-CM | POA: Diagnosis present

## 2012-09-13 DIAGNOSIS — I6930 Unspecified sequelae of cerebral infarction: Secondary | ICD-10-CM

## 2012-09-13 DIAGNOSIS — D72829 Elevated white blood cell count, unspecified: Secondary | ICD-10-CM

## 2012-09-13 DIAGNOSIS — M4628 Osteomyelitis of vertebra, sacral and sacrococcygeal region: Secondary | ICD-10-CM

## 2012-09-13 DIAGNOSIS — K9413 Enterostomy malfunction: Secondary | ICD-10-CM | POA: Diagnosis present

## 2012-09-13 DIAGNOSIS — J690 Pneumonitis due to inhalation of food and vomit: Secondary | ICD-10-CM

## 2012-09-13 DIAGNOSIS — I69391 Dysphagia following cerebral infarction: Secondary | ICD-10-CM

## 2012-09-13 DIAGNOSIS — T827XXA Infection and inflammatory reaction due to other cardiac and vascular devices, implants and grafts, initial encounter: Secondary | ICD-10-CM

## 2012-09-13 DIAGNOSIS — I69959 Hemiplegia and hemiparesis following unspecified cerebrovascular disease affecting unspecified side: Secondary | ICD-10-CM

## 2012-09-13 DIAGNOSIS — Z7401 Bed confinement status: Secondary | ICD-10-CM

## 2012-09-13 DIAGNOSIS — E039 Hypothyroidism, unspecified: Secondary | ICD-10-CM

## 2012-09-13 DIAGNOSIS — L89309 Pressure ulcer of unspecified buttock, unspecified stage: Secondary | ICD-10-CM | POA: Diagnosis present

## 2012-09-13 DIAGNOSIS — I69991 Dysphagia following unspecified cerebrovascular disease: Secondary | ICD-10-CM

## 2012-09-13 DIAGNOSIS — I798 Other disorders of arteries, arterioles and capillaries in diseases classified elsewhere: Secondary | ICD-10-CM | POA: Diagnosis present

## 2012-09-13 DIAGNOSIS — K920 Hematemesis: Secondary | ICD-10-CM

## 2012-09-13 DIAGNOSIS — Z6826 Body mass index (BMI) 26.0-26.9, adult: Secondary | ICD-10-CM

## 2012-09-13 DIAGNOSIS — E1351 Other specified diabetes mellitus with diabetic peripheral angiopathy without gangrene: Secondary | ICD-10-CM

## 2012-09-13 DIAGNOSIS — L8990 Pressure ulcer of unspecified site, unspecified stage: Secondary | ICD-10-CM

## 2012-09-13 DIAGNOSIS — K219 Gastro-esophageal reflux disease without esophagitis: Secondary | ICD-10-CM | POA: Diagnosis present

## 2012-09-13 DIAGNOSIS — E875 Hyperkalemia: Secondary | ICD-10-CM

## 2012-09-13 DIAGNOSIS — IMO0002 Reserved for concepts with insufficient information to code with codable children: Secondary | ICD-10-CM

## 2012-09-13 DIAGNOSIS — E1159 Type 2 diabetes mellitus with other circulatory complications: Secondary | ICD-10-CM | POA: Diagnosis present

## 2012-09-13 DIAGNOSIS — R1310 Dysphagia, unspecified: Secondary | ICD-10-CM | POA: Diagnosis present

## 2012-09-13 DIAGNOSIS — M549 Dorsalgia, unspecified: Secondary | ICD-10-CM

## 2012-09-13 DIAGNOSIS — A499 Bacterial infection, unspecified: Secondary | ICD-10-CM

## 2012-09-13 DIAGNOSIS — K9403 Colostomy malfunction: Secondary | ICD-10-CM | POA: Diagnosis present

## 2012-09-13 DIAGNOSIS — E440 Moderate protein-calorie malnutrition: Secondary | ICD-10-CM

## 2012-09-13 DIAGNOSIS — L89154 Pressure ulcer of sacral region, stage 4: Secondary | ICD-10-CM

## 2012-09-13 DIAGNOSIS — L8994 Pressure ulcer of unspecified site, stage 4: Secondary | ICD-10-CM | POA: Diagnosis present

## 2012-09-13 DIAGNOSIS — R7881 Bacteremia: Secondary | ICD-10-CM

## 2012-09-13 LAB — COMPREHENSIVE METABOLIC PANEL
ALT: 10 U/L (ref 0–53)
AST: 18 U/L (ref 0–37)
CO2: 24 mEq/L (ref 19–32)
Calcium: 9.3 mg/dL (ref 8.4–10.5)
Chloride: 99 mEq/L (ref 96–112)
Creatinine, Ser: 1.7 mg/dL — ABNORMAL HIGH (ref 0.50–1.35)
GFR calc Af Amer: 44 mL/min — ABNORMAL LOW (ref >90)
GFR calc non Af Amer: 38 mL/min — ABNORMAL LOW (ref >90)
Glucose, Bld: 193 mg/dL — ABNORMAL HIGH (ref 70–99)
Total Bilirubin: 0.3 mg/dL (ref 0.3–1.2)

## 2012-09-13 LAB — URINE MICROSCOPIC-ADD ON

## 2012-09-13 LAB — CBC WITH DIFFERENTIAL/PLATELET
Basophils Absolute: 0.1 10*3/uL (ref 0.0–0.1)
Eosinophils Relative: 0 % (ref 0–5)
HCT: 33.3 % — ABNORMAL LOW (ref 39.0–52.0)
Hemoglobin: 10.9 g/dL — ABNORMAL LOW (ref 13.0–17.0)
Lymphocytes Relative: 8 % — ABNORMAL LOW (ref 12–46)
Lymphs Abs: 1.7 10*3/uL (ref 0.7–4.0)
MCV: 77.6 fL — ABNORMAL LOW (ref 78.0–100.0)
Monocytes Absolute: 2 10*3/uL — ABNORMAL HIGH (ref 0.1–1.0)
Neutro Abs: 17.9 10*3/uL — ABNORMAL HIGH (ref 1.7–7.7)
RBC: 4.29 MIL/uL (ref 4.22–5.81)
RDW: 17.2 % — ABNORMAL HIGH (ref 11.5–15.5)
WBC: 21.7 10*3/uL — ABNORMAL HIGH (ref 4.0–10.5)

## 2012-09-13 LAB — URINALYSIS, ROUTINE W REFLEX MICROSCOPIC
Nitrite: NEGATIVE
Specific Gravity, Urine: 1.02 (ref 1.005–1.030)
pH: 5.5 (ref 5.0–8.0)

## 2012-09-13 LAB — CG4 I-STAT (LACTIC ACID): Lactic Acid, Venous: 2.08 mmol/L (ref 0.5–2.2)

## 2012-09-13 MED ORDER — SODIUM CHLORIDE 0.9 % IV SOLN: INTRAVENOUS | Status: DC

## 2012-09-13 MED ORDER — PRO-STAT SUGAR FREE PO LIQD
30.0000 mL | Freq: Three times a day (TID) | ORAL | Status: DC
  Administered 2012-09-14 – 2012-09-15 (×4): 30 mL
  Filled 2012-09-13 (×8): qty 30

## 2012-09-13 MED ORDER — TRAZODONE 25 MG HALF TABLET
25.0000 mg | ORAL_TABLET | Freq: Every day | ORAL | Status: DC
  Administered 2012-09-15 – 2012-09-22 (×8): 25 mg via ORAL
  Filled 2012-09-13 (×11): qty 1

## 2012-09-13 MED ORDER — LIOTHYRONINE SODIUM 25 MCG PO TABS
25.0000 ug | ORAL_TABLET | Freq: Every morning | ORAL | Status: DC
  Administered 2012-09-14 – 2012-09-23 (×10): 25 ug via ORAL
  Filled 2012-09-13 (×10): qty 1

## 2012-09-13 MED ORDER — INSULIN ASPART 100 UNIT/ML ~~LOC~~ SOLN
0.0000 [IU] | Freq: Three times a day (TID) | SUBCUTANEOUS | Status: DC
  Administered 2012-09-14: 2 [IU] via SUBCUTANEOUS

## 2012-09-13 MED ORDER — PIPERACILLIN-TAZOBACTAM 3.375 G IVPB 30 MIN
3.3750 g | Freq: Once | INTRAVENOUS | Status: AC
  Administered 2012-09-14: 3.375 g via INTRAVENOUS
  Filled 2012-09-13: qty 50

## 2012-09-13 MED ORDER — SODIUM CHLORIDE 0.9 % IV SOLN
INTRAVENOUS | Status: DC
Start: 2012-09-14 — End: 2012-09-23
  Administered 2012-09-14: 01:00:00 via INTRAVENOUS
  Administered 2012-09-16: 20 mL via INTRAVENOUS

## 2012-09-13 MED ORDER — FERROUS SULFATE 300 (60 FE) MG/5ML PO SYRP
300.0000 mg | ORAL_SOLUTION | Freq: Two times a day (BID) | ORAL | Status: DC
  Administered 2012-09-13 – 2012-09-23 (×20): 300 mg
  Filled 2012-09-13 (×21): qty 5

## 2012-09-13 MED ORDER — ENOXAPARIN SODIUM 40 MG/0.4ML ~~LOC~~ SOLN
40.0000 mg | Freq: Every day | SUBCUTANEOUS | Status: DC
  Administered 2012-09-13 – 2012-09-22 (×9): 40 mg via SUBCUTANEOUS
  Filled 2012-09-13 (×11): qty 0.4

## 2012-09-13 MED ORDER — SCOPOLAMINE 1 MG/3DAYS TD PT72
1.0000 | MEDICATED_PATCH | TRANSDERMAL | Status: DC
  Administered 2012-09-15 – 2012-09-18 (×2): 1.5 mg via TRANSDERMAL
  Administered 2012-09-21: 09:00:00 via TRANSDERMAL
  Filled 2012-09-13 (×3): qty 1

## 2012-09-13 MED ORDER — GLYCOPYRROLATE 1 MG PO TABS
1.0000 mg | ORAL_TABLET | Freq: Three times a day (TID) | ORAL | Status: DC
  Administered 2012-09-13 – 2012-09-23 (×27): 1 mg via ORAL
  Filled 2012-09-13 (×31): qty 1

## 2012-09-13 MED ORDER — SODIUM CHLORIDE 0.9 % IV BOLUS (SEPSIS)
250.0000 mL | Freq: Once | INTRAVENOUS | Status: AC
  Administered 2012-09-13: 250 mL via INTRAVENOUS

## 2012-09-13 MED ORDER — VITAMIN C 500 MG PO TABS
500.0000 mg | ORAL_TABLET | Freq: Two times a day (BID) | ORAL | Status: DC
  Administered 2012-09-13 – 2012-09-23 (×19): 500 mg
  Filled 2012-09-13 (×21): qty 1

## 2012-09-13 MED ORDER — TRAMADOL HCL 50 MG PO TABS
50.0000 mg | ORAL_TABLET | Freq: Four times a day (QID) | ORAL | Status: DC | PRN
  Administered 2012-09-15: 50 mg via ORAL
  Filled 2012-09-13: qty 1

## 2012-09-13 MED ORDER — GABAPENTIN 300 MG PO CAPS
300.0000 mg | ORAL_CAPSULE | Freq: Every day | ORAL | Status: DC
  Administered 2012-09-13 – 2012-09-22 (×10): 300 mg
  Filled 2012-09-13 (×11): qty 1

## 2012-09-13 MED ORDER — CEFTRIAXONE SODIUM 1 G IJ SOLR
1.0000 g | Freq: Once | INTRAMUSCULAR | Status: AC
  Administered 2012-09-13: 1 g via INTRAVENOUS
  Filled 2012-09-13: qty 10

## 2012-09-13 MED ORDER — LEVOTHYROXINE SODIUM 125 MCG PO TABS
125.0000 ug | ORAL_TABLET | Freq: Every day | ORAL | Status: DC
  Administered 2012-09-14 – 2012-09-23 (×10): 125 ug via ORAL
  Filled 2012-09-13 (×13): qty 1

## 2012-09-13 MED ORDER — INSULIN GLARGINE 100 UNIT/ML ~~LOC~~ SOLN
20.0000 [IU] | Freq: Every day | SUBCUTANEOUS | Status: DC
  Administered 2012-09-15 – 2012-09-22 (×8): 20 [IU] via SUBCUTANEOUS
  Filled 2012-09-13 (×11): qty 0.2

## 2012-09-13 MED ORDER — VANCOMYCIN HCL IN DEXTROSE 1-5 GM/200ML-% IV SOLN
1000.0000 mg | Freq: Once | INTRAVENOUS | Status: AC
  Administered 2012-09-13: 1000 mg via INTRAVENOUS
  Filled 2012-09-13: qty 200

## 2012-09-13 MED ORDER — CITALOPRAM HYDROBROMIDE 10 MG PO TABS
10.0000 mg | ORAL_TABLET | Freq: Every morning | ORAL | Status: DC
  Administered 2012-09-14 – 2012-09-23 (×10): 10 mg via ORAL
  Filled 2012-09-13 (×10): qty 1

## 2012-09-13 MED ORDER — METOPROLOL TARTRATE 25 MG PO TABS
25.0000 mg | ORAL_TABLET | Freq: Two times a day (BID) | ORAL | Status: DC
  Administered 2012-09-13 – 2012-09-23 (×19): 25 mg
  Filled 2012-09-13 (×21): qty 1

## 2012-09-13 NOTE — H&P (Signed)
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 Patient has PEG tube in place, chronic Foley catheter, colostomy bag in place. Patient has a large decubitus ulcer which had been from time. Patient had a flap done a year ago and the ulcer failed to heal at this time as per the wife patient is not a candidate for further surgery. Colostomy was done to prevent soiling of the ulcer. Today he  was brought to the hospital for fever, hypotension. Patient is found to have elevated white count, and abnormal UA. He denies chest pain shortness of breath no nausea vomiting or diarrhea. He denies pain at this time  Allergies:  No Known Allergies    Past Medical History  Diagnosis Date  . Stroke     Hx of with right hemiparesis  . Hypertension   . Endocarditis, mitral valve, syphilitic   . Hypothyroidism   . Anorexia   . Dysphagia   . Reflux   . Bacteremia   . Clostridium difficile colitis   . CVA (cerebral vascular accident)   . Pressure ulcer   . Diabetes mellitus, type 2   . Anemia   . Stroke     Past Surgical History  Procedure Laterality Date  . Colostomy    . Peg placement    . Hernia repair      Prior to Admission medications   Medication Sig Start Date End Date Taking? Authorizing Provider  acetaminophen (TYLENOL) 160 MG/5ML liquid Take 640 mg by mouth every 4 (four) hours as needed for fever.   Yes Historical Provider, MD  citalopram (CELEXA) 10 MG tablet 10 mg by PEG Tube route every morning.    Yes Historical Provider, MD  feeding supplement (PRO-STAT SUGAR FREE 64) LIQD Place 30 mLs into feeding tube 3 (three) times daily with meals. 05/06/12  Yes Vassie Loll, MD  ferrous sulfate 300 (60 FE) MG/5ML syrup Give 300 mg by tube 2 (two) times daily.     Yes Historical Provider, MD   gabapentin (NEURONTIN) 300 MG capsule Give 300 mg by tube at bedtime.    Yes Historical Provider, MD  glycopyrrolate (ROBINUL) 1 MG tablet 1 mg by PEG Tube route 3 (three) times daily.   Yes Historical Provider, MD  insulin aspart (NOVOLOG) 100 UNIT/ML injection Inject 5 Units into the skin 3 (three) times daily before meals. For cbg > 250   Yes Historical Provider, MD  insulin glargine (LANTUS) 100 UNIT/ML injection Inject 20 Units into the skin at bedtime. 05/06/12  Yes Vassie Loll, MD  levothyroxine (SYNTHROID, LEVOTHROID) 125 MCG tablet Take 125 mcg by mouth every morning. 05/06/12  Yes Vassie Loll, MD  liothyronine (CYTOMEL) 25 MCG tablet 25 mcg by PEG Tube route every morning.    Yes Historical Provider, MD  metoprolol tartrate (LOPRESSOR) 25 MG tablet Give 25 mg by tube 2 (two) times daily.    Yes Historical Provider, MD  Multiple Vitamins-Minerals (MULTIVITAMINS THER. W/MINERALS) TABS 1 tablet by PEG Tube route every morning.    Yes Historical Provider, MD  potassium chloride 20 MEQ/15ML (10%) solution Give 20 mEq by tube every morning.    Yes Historical Provider, MD  scopolamine (TRANSDERM-SCOP) 1.5 MG Place 1 patch onto the skin every 3 (three) days.   Yes  Historical Provider, MD  Thiamine HCl (VITAMIN B-1) 100 MG tablet 100 mg by PEG Tube route every morning.    Yes Historical Provider, MD  traMADol (ULTRAM) 50 MG tablet 50 mg by PEG Tube route every 6 (six) hours as needed for pain. 05/06/12  Yes Vassie Loll, MD  traZODone (DESYREL) 50 MG tablet Take 25 mg by mouth at bedtime. For insomnia   Yes Historical Provider, MD  vitamin C (ASCORBIC ACID) 500 MG tablet Give 500 mg by tube 2 (two) times daily.    Yes Historical Provider, MD    Social History:  reports that he has never smoked. He does not have any smokeless tobacco history on file. He reports that he does not drink alcohol or use illicit drugs.  Family History  Problem Relation Age of Onset  . Cancer Mother   . Other  Father     old age after a fall  . Alzheimer's disease Sister     Review of Systems:  HEENT: Denies headache, blurred vision, runny nose, sore throat,  Neck: Positive thyroid problems, no lymphadenopathy Chest : Denies shortness of breath, no history of COPD Heart : Denies Chest pain,  coronary arterey disease GI: Denies  nausea, vomiting, diarrhea, constipation GU: Chronic indwelling Foley catheter to prevent soiling of the sacral wound Neuro: Denies stroke, seizures, syncope Psych: Denies depression, anxiety, hallucinations   Physical Exam: Blood pressure 113/66, pulse 84, temperature 98.9 F (37.2 C), temperature source Oral, resp. rate 18, SpO2 95.00%. Constitutional:   Patient is a well-developed and well-nourished male in no acute distress and cooperative with exam. Head: Normocephalic and atraumatic Mouth: Mucus membranes moist Eyes: PERRL, EOMI, conjunctivae normal Neck: Supple, No Thyromegaly Cardiovascular: RRR, S1 normal, S2 normal Pulmonary/Chest: CTAB, no wheezes, rales, or rhonchi Abdominal: Soft. Non-tender, non-distended, bowel sounds are normal, no masses, organomegaly, or guarding present.  Sacrum: Large stage IV 5 x 6 cm ulcer noted in the sacrum, with fall swelling discharge  Neurological: A&O x3, Strenght is normal in left upper extremity, hemiparesis in the right upper extremity and lower extremity II-XII are grossly intact, no focal motor deficit, sensory intact to light touch bilaterally.  Extremities : No Cyanosis, Clubbing or Edema   Labs on Admission:  Results for orders placed during the hospital encounter of 09/13/12 (from the past 48 hour(s))  CBC WITH DIFFERENTIAL     Status: Abnormal   Collection Time    09/13/12  3:56 PM      Result Value Range   WBC 21.7 (*) 4.0 - 10.5 K/uL   RBC 4.29  4.22 - 5.81 MIL/uL   Hemoglobin 10.9 (*) 13.0 - 17.0 g/dL   HCT 40.9 (*) 81.1 - 91.4 %   MCV 77.6 (*) 78.0 - 100.0 fL   MCH 25.4 (*) 26.0 - 34.0 pg   MCHC  32.7  30.0 - 36.0 g/dL   RDW 78.2 (*) 95.6 - 21.3 %   Platelets 351  150 - 400 K/uL   Neutrophils Relative % 83 (*) 43 - 77 %   Neutro Abs 17.9 (*) 1.7 - 7.7 K/uL   Lymphocytes Relative 8 (*) 12 - 46 %   Lymphs Abs 1.7  0.7 - 4.0 K/uL   Monocytes Relative 9  3 - 12 %   Monocytes Absolute 2.0 (*) 0.1 - 1.0 K/uL   Eosinophils Relative 0  0 - 5 %   Eosinophils Absolute 0.0  0.0 - 0.7 K/uL   Basophils Relative 0  0 -  1 %   Basophils Absolute 0.1  0.0 - 0.1 K/uL  COMPREHENSIVE METABOLIC PANEL     Status: Abnormal   Collection Time    09/13/12  3:56 PM      Result Value Range   Sodium 133 (*) 135 - 145 mEq/L   Potassium 5.1  3.5 - 5.1 mEq/L   Chloride 99  96 - 112 mEq/L   CO2 24  19 - 32 mEq/L   Glucose, Bld 193 (*) 70 - 99 mg/dL   BUN 43 (*) 6 - 23 mg/dL   Creatinine, Ser 4.69 (*) 0.50 - 1.35 mg/dL   Calcium 9.3  8.4 - 62.9 mg/dL   Total Protein 8.6 (*) 6.0 - 8.3 g/dL   Albumin 2.1 (*) 3.5 - 5.2 g/dL   AST 18  0 - 37 U/L   ALT 10  0 - 53 U/L   Alkaline Phosphatase 83  39 - 117 U/L   Total Bilirubin 0.3  0.3 - 1.2 mg/dL   GFR calc non Af Amer 38 (*) >90 mL/min   GFR calc Af Amer 44 (*) >90 mL/min   Comment:            The eGFR has been calculated     using the CKD EPI equation.     This calculation has not been     validated in all clinical     situations.     eGFR's persistently     <90 mL/min signify     possible Chronic Kidney Disease.  LIPASE, BLOOD     Status: None   Collection Time    09/13/12  3:56 PM      Result Value Range   Lipase 28  11 - 59 U/L  PRO B NATRIURETIC PEPTIDE     Status: Abnormal   Collection Time    09/13/12  3:56 PM      Result Value Range   Pro B Natriuretic peptide (BNP) 2027.0 (*) 0 - 450 pg/mL  CG4 I-STAT (LACTIC ACID)     Status: None   Collection Time    09/13/12  4:05 PM      Result Value Range   Lactic Acid, Venous 2.20  0.5 - 2.2 mmol/L  CG4 I-STAT (LACTIC ACID)     Status: None   Collection Time    09/13/12  4:46 PM      Result  Value Range   Lactic Acid, Venous 2.08  0.5 - 2.2 mmol/L  URINALYSIS, ROUTINE W REFLEX MICROSCOPIC     Status: Abnormal   Collection Time    09/13/12  6:02 PM      Result Value Range   Color, Urine AMBER (*) YELLOW   Comment: BIOCHEMICALS MAY BE AFFECTED BY COLOR   APPearance TURBID (*) CLEAR   Specific Gravity, Urine 1.020  1.005 - 1.030   pH 5.5  5.0 - 8.0   Glucose, UA NEGATIVE  NEGATIVE mg/dL   Hgb urine dipstick LARGE (*) NEGATIVE   Bilirubin Urine NEGATIVE  NEGATIVE   Ketones, ur NEGATIVE  NEGATIVE mg/dL   Protein, ur 30 (*) NEGATIVE mg/dL   Urobilinogen, UA 1.0  0.0 - 1.0 mg/dL   Nitrite NEGATIVE  NEGATIVE   Leukocytes, UA LARGE (*) NEGATIVE  URINE MICROSCOPIC-ADD ON     Status: Abnormal   Collection Time    09/13/12  6:02 PM      Result Value Range   WBC, UA TOO NUMEROUS TO COUNT  <3 WBC/hpf  RBC / HPF TOO NUMEROUS TO COUNT  <3 RBC/hpf   Bacteria, UA MANY (*) RARE    Radiological Exams on Admission: Dg Chest Port 1 View  09/13/2012   *RADIOLOGY REPORT*  Clinical Data: Urinary tract infection.  History of hypertension and diabetes.  PORTABLE CHEST - 1 VIEW  Comparison: 05/06/2012.  Findings: 1536 hours.  Right subclavian pacemaker leads are unchanged within the right atrium and right ventricle.  The heart size and mediastinal contours are stable with aortic atherosclerosis.  The overall pulmonary aeration has improved. There is no edema, confluent airspace opacity or significant pleural effusion.  The patient is status post lower cervical thoracic fusion.  IMPRESSION: No acute cardiopulmonary process.   Original Report Authenticated By: Carey Bullocks, M.D.    Assessment/Plan Sepsis due to UTI Stage IV decubitus ulcer History of CVA PEG tube nutrition Chronic indwelling Foley catheter Diabetes mellitus  Sepsis due to UTI Patient was started on Zosyn. We'll obtain the urine culture and adjust antibiotics appropriately after the culture is resulted  Stage IV  decubitus ulcer Patient has active infection with foul-smelling discharge, will start vancomycin. Will obtain wound care consult No surgical option at this time as per family, family was told by [facility] surgeon  History of CVA Stable  PEG tube nutrition We'll continue with the tube feedings  Chronic indwelling Foley catheter Foley catheter in place to prevent soiling of the ulcer  Diabetes mellitus We'll start sliding scale insulin  Hypothyroidism Start Synthroid  Hypertension  continue Lopressor  DVT prophylaxis Lovenox  CODE STATUS: Patient is full code Family discussion. Discussed with wife at bedside  Time Spent on Admission: 75 minutes  [facility] Pager: [phone] 09/13/2012, 9:07 PM

## 2012-09-13 NOTE — ED Notes (Signed)
reciving nurse update vital signs. patienet has 101.3 rectal temp - . Nurse is aware.

## 2012-09-13 NOTE — ED Notes (Signed)
Per [facility]/EMS-from nursing home, staff states pressure ulcer on coccyx, with green discharge-cloudy urine in cath-fever of 102.9, given tylenol 625 mg at 1100-wife states he was combative yesterday and today he is more lethargic today

## 2012-09-13 NOTE — ED Provider Notes (Signed)
History     CSN: [id number]  Arrival date & time 09/13/12  1435   First MD Initiated Contact with Patient 09/13/12 1511      Chief Complaint  Patient presents with  . Urinary Tract Infection    (Consider location/radiation/quality/duration/timing/severity/associated sxs/prior treatment) Patient is a 77 y.o. male presenting with urinary tract infection. The history is provided by the patient, the EMS personnel, the nursing home and the spouse. The history is limited by the condition of the patient.  Urinary Tract Infection Pertinent negatives include no headaches.   edema new onset of fever today and confusion. Level V caveat applies. Patient is from a nursing home. Patient had a fever there 102.9. Her treated with Tylenol prior to arrival. Patient brought in by [facility]. Patient's wife noted that the patient seemed a little off yesterday but then today was very sleepy and confused. In the nursing home identified a fever.   Past Medical History  Diagnosis Date  . Stroke     Hx of with right hemiparesis  . Hypertension   . Endocarditis, mitral valve, syphilitic   . Hypothyroidism   . Anorexia   . Dysphagia   . Reflux   . Bacteremia   . Clostridium difficile colitis   . CVA (cerebral vascular accident)   . Pressure ulcer   . Diabetes mellitus, type 2   . Anemia   . Stroke     Past Surgical History  Procedure Laterality Date  . Colostomy    . Peg placement    . Hernia repair      Family History  Problem Relation Age of Onset  . Cancer Mother   . Other Father     old age after a fall  . Alzheimer's disease Sister     History  Substance Use Topics  . Smoking status: Never Smoker   . Smokeless tobacco: Not on file  . Alcohol Use: No      Review of Systems  Constitutional: Positive for fever.  Musculoskeletal: Negative for back pain.  Neurological: Negative for headaches.  Hematological: Does not bruise/bleed easily.  Psychiatric/Behavioral: Positive for  confusion.   otherwise 5 caveat applies due to his confusion.  Allergies  Review of patient's allergies indicates no known allergies.  Home Medications   Current Outpatient Rx  Name  Route  Sig  Dispense  Refill  . acetaminophen (TYLENOL) 160 MG/5ML liquid   Oral   Take 640 mg by mouth every 4 (four) hours as needed for fever.         . citalopram (CELEXA) 10 MG tablet   PEG Tube   10 mg by PEG Tube route every morning.          . feeding supplement (PRO-STAT SUGAR FREE 64) LIQD   Per Tube   Place 30 mLs into feeding tube 3 (three) times daily with meals.         . ferrous sulfate 300 (60 FE) MG/5ML syrup   Tube   Give 300 mg by tube 2 (two) times daily.           Marland Kitchen gabapentin (NEURONTIN) 300 MG capsule   Tube   Give 300 mg by tube at bedtime.          Marland Kitchen glycopyrrolate (ROBINUL) 1 MG tablet   PEG Tube   1 mg by PEG Tube route 3 (three) times daily.         . insulin aspart (NOVOLOG) 100 UNIT/ML injection  Subcutaneous   Inject 5 Units into the skin 3 (three) times daily before meals. For cbg > 250         . insulin glargine (LANTUS) 100 UNIT/ML injection   Subcutaneous   Inject 20 Units into the skin at bedtime.         Marland Kitchen levothyroxine (SYNTHROID, LEVOTHROID) 125 MCG tablet   Oral   Take 125 mcg by mouth every morning.         Marland Kitchen liothyronine (CYTOMEL) 25 MCG tablet   PEG Tube   25 mcg by PEG Tube route every morning.          . metoprolol tartrate (LOPRESSOR) 25 MG tablet   Tube   Give 25 mg by tube 2 (two) times daily.          . Multiple Vitamins-Minerals (MULTIVITAMINS THER. W/MINERALS) TABS   PEG Tube   1 tablet by PEG Tube route every morning.          . potassium chloride 20 MEQ/15ML (10%) solution   Tube   Give 20 mEq by tube every morning.          Marland Kitchen scopolamine (TRANSDERM-SCOP) 1.5 MG   Transdermal   Place 1 patch onto the skin every 3 (three) days.         . Thiamine HCl (VITAMIN B-1) 100 MG tablet   PEG Tube    100 mg by PEG Tube route every morning.          . traMADol (ULTRAM) 50 MG tablet   PEG Tube   50 mg by PEG Tube route every 6 (six) hours as needed for pain.         . traZODone (DESYREL) 50 MG tablet   Oral   Take 25 mg by mouth at bedtime. For insomnia         . vitamin C (ASCORBIC ACID) 500 MG tablet   Tube   Give 500 mg by tube 2 (two) times daily.            BP 113/66  Pulse 84  Temp(Src) 98.9 F (37.2 C) (Oral)  Resp 18  SpO2 95%  Physical Exam  Nursing note and vitals reviewed. Constitutional: He appears well-developed and well-nourished. No distress.  HENT:  Head: Normocephalic and atraumatic.  Mouth/Throat: Oropharynx is clear and moist.  Eyes: Conjunctivae and EOM are normal. Pupils are equal, round, and reactive to light.  Neck: Normal range of motion. Neck supple.  Cardiovascular: Normal rate, regular rhythm and normal heart sounds.   No murmur heard. Abdominal: Soft. Bowel sounds are normal. He exhibits distension. There is no tenderness.  Functioning colostomy in the left lower quadrant.  Musculoskeletal: Normal range of motion.  Neurological: He is alert. No cranial nerve deficit. He exhibits normal muscle tone. Coordination normal.  Skin: Skin is warm. No rash noted.    ED Course  Procedures (including critical care time)  Labs Reviewed  CBC WITH DIFFERENTIAL - Abnormal; Notable for the following:    WBC 21.7 (*)    Hemoglobin 10.9 (*)    HCT 33.3 (*)    MCV 77.6 (*)    MCH 25.4 (*)    RDW 17.2 (*)    Neutrophils Relative % 83 (*)    Neutro Abs 17.9 (*)    Lymphocytes Relative 8 (*)    Monocytes Absolute 2.0 (*)    All other components within normal limits  COMPREHENSIVE METABOLIC PANEL - Abnormal; Notable for the following:  Sodium 133 (*)    Glucose, Bld 193 (*)    BUN 43 (*)    Creatinine, Ser 1.70 (*)    Total Protein 8.6 (*)    Albumin 2.1 (*)    GFR calc non Af Amer 38 (*)    GFR calc Af Amer 44 (*)    All other  components within normal limits  URINALYSIS, ROUTINE W REFLEX MICROSCOPIC - Abnormal; Notable for the following:    Color, Urine AMBER (*)    APPearance TURBID (*)    Hgb urine dipstick LARGE (*)    Protein, ur 30 (*)    Leukocytes, UA LARGE (*)    All other components within normal limits  PRO B NATRIURETIC PEPTIDE - Abnormal; Notable for the following:    Pro B Natriuretic peptide (BNP) 2027.0 (*)    All other components within normal limits  URINE MICROSCOPIC-ADD ON - Abnormal; Notable for the following:    Bacteria, UA MANY (*)    All other components within normal limits  URINE CULTURE  CULTURE, BLOOD (ROUTINE X 2)  CULTURE, BLOOD (ROUTINE X 2)  LIPASE, BLOOD  CG4 I-STAT (LACTIC ACID)  CG4 I-STAT (LACTIC ACID)   Dg Chest Port 1 View  09/13/2012   *RADIOLOGY REPORT*  Clinical Data: Urinary tract infection.  History of hypertension and diabetes.  PORTABLE CHEST - 1 VIEW  Comparison: 05/06/2012.  Findings: 1536 hours.  Right subclavian pacemaker leads are unchanged within the right atrium and right ventricle.  The heart size and mediastinal contours are stable with aortic atherosclerosis.  The overall pulmonary aeration has improved. There is no edema, confluent airspace opacity or significant pleural effusion.  The patient is status post lower cervical thoracic fusion.  IMPRESSION: No acute cardiopulmonary process.   Original Report Authenticated By: Carey Bullocks, M.D.     1. Urinary tract infection   2. Altered mental status   3. Decubitus skin ulcer, unspecified pressure ulcer stage   4. Fever       MDM  Patient with fever starting today increased confusion and lethargy starting today. We'll workup of significant for marked abnormalities in the urine consistent with urinary tract infection. Most likely this is the cause of the fever. Lactic acids are normal. Blood pressure has been normal no evidence of sepsis her hypotension at this point in time. Patient will wake up and  follow some commands not as alert as usual. Patient also known to have a recurrent decubitus ulcer in the coccyx area but does have some greenish discharge comes with that in the past hasn't cleared up but started to come back here again recently. Still think that most of the problems or go to the urine. Patient also has a marked leukocytosis patient had blood cultures and urine culture and was started on 1 g of IV Rocephin for the urinary tract infection. Discussed with the hospitalist they will mid to telemetry.        Shelda Jakes, MD 09/13/12 2038

## 2012-09-14 ENCOUNTER — Encounter (HOSPITAL_COMMUNITY): Payer: Self-pay | Admitting: *Deleted

## 2012-09-14 DIAGNOSIS — N179 Acute kidney failure, unspecified: Secondary | ICD-10-CM

## 2012-09-14 DIAGNOSIS — A419 Sepsis, unspecified organism: Secondary | ICD-10-CM

## 2012-09-14 DIAGNOSIS — L89304 Pressure ulcer of unspecified buttock, stage 4: Secondary | ICD-10-CM | POA: Diagnosis present

## 2012-09-14 DIAGNOSIS — L89309 Pressure ulcer of unspecified buttock, unspecified stage: Secondary | ICD-10-CM

## 2012-09-14 DIAGNOSIS — L8994 Pressure ulcer of unspecified site, stage 4: Secondary | ICD-10-CM

## 2012-09-14 LAB — GLUCOSE, CAPILLARY
Glucose-Capillary: 114 mg/dL — ABNORMAL HIGH (ref 70–99)
Glucose-Capillary: 121 mg/dL — ABNORMAL HIGH (ref 70–99)
Glucose-Capillary: 135 mg/dL — ABNORMAL HIGH (ref 70–99)
Glucose-Capillary: 150 mg/dL — ABNORMAL HIGH (ref 70–99)
Glucose-Capillary: 152 mg/dL — ABNORMAL HIGH (ref 70–99)
Glucose-Capillary: 157 mg/dL — ABNORMAL HIGH (ref 70–99)

## 2012-09-14 LAB — URINE CULTURE

## 2012-09-14 LAB — CBC
HCT: 31.6 % — ABNORMAL LOW (ref 39.0–52.0)
MCH: 24.6 pg — ABNORMAL LOW (ref 26.0–34.0)
MCHC: 32 g/dL (ref 30.0–36.0)
MCV: 77.1 fL — ABNORMAL LOW (ref 78.0–100.0)
Platelets: 324 10*3/uL (ref 150–400)
RDW: 17.3 % — ABNORMAL HIGH (ref 11.5–15.5)

## 2012-09-14 LAB — COMPREHENSIVE METABOLIC PANEL
AST: 20 U/L (ref 0–37)
Albumin: 2.2 g/dL — ABNORMAL LOW (ref 3.5–5.2)
BUN: 42 mg/dL — ABNORMAL HIGH (ref 6–23)
Calcium: 9.1 mg/dL (ref 8.4–10.5)
Creatinine, Ser: 1.44 mg/dL — ABNORMAL HIGH (ref 0.50–1.35)
Total Bilirubin: 0.3 mg/dL (ref 0.3–1.2)

## 2012-09-14 MED ORDER — VANCOMYCIN HCL 10 G IV SOLR
1250.0000 mg | INTRAVENOUS | Status: DC
  Administered 2012-09-14 – 2012-09-15 (×2): 1250 mg via INTRAVENOUS
  Filled 2012-09-14 (×2): qty 1250

## 2012-09-14 MED ORDER — ACETAMINOPHEN 160 MG/5ML PO SOLN
650.0000 mg | Freq: Four times a day (QID) | ORAL | Status: DC | PRN
  Administered 2012-09-14: 650 mg via ORAL
  Filled 2012-09-14: qty 20.3

## 2012-09-14 MED ORDER — PIPERACILLIN-TAZOBACTAM 3.375 G IVPB 30 MIN
3.3750 g | Freq: Three times a day (TID) | INTRAVENOUS | Status: DC
  Administered 2012-09-14 – 2012-09-16 (×6): 3.375 g via INTRAVENOUS
  Filled 2012-09-14 (×7): qty 50

## 2012-09-14 MED ORDER — INSULIN ASPART 100 UNIT/ML ~~LOC~~ SOLN
0.0000 [IU] | SUBCUTANEOUS | Status: DC
  Administered 2012-09-14 – 2012-09-19 (×4): 1 [IU] via SUBCUTANEOUS

## 2012-09-14 NOTE — Progress Notes (Addendum)
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  patient has been residing at skilled facility for past 3 years and is totally dependent for his care. Patient has PEG tube in place, chronic Foley catheter, colostomy bag in place. Patient has a large decubitus ulcer which had been from time. Patient had a flap done a year ago and the ulcer failed to heal at this time as per the wife patient is not a candidate for further surgery. Colostomy was done to prevent soiling of the ulcer. And is septic    Assessment/Plan: Sepsis due to UTI and infected Decubitus ulcer of buttock, stage 4 : - Vanc. and zosyn 5.31.2014 - Blood cultures are pending. Cont to have fevers and leukocytosis. - Blood pressure stable. - Pt is a full code d/w family poor prognosis and readmission despite he making it through this episode. They are going to talk about it.  Leukocytosis/ Fever: - due to #1.  AKI: - due to sepsis cont IV fluids.   DM 2: - cont Lantus and SSI.  Code Status: full Family Communication: wife and children  Disposition Plan: inapteint   Consultants:  none  Procedures:  none  Antibiotics:  vanc and zosyn 5.31.2014 (indicate start date, and stop date if known)  HPI/Subjective: unresponsive  Objective: Filed Vitals:   09/13/12 2140 09/13/12 2221 09/13/12 2228 09/14/12 0611  BP: 126/74 134/80  94/57  Pulse: 94 94  87  Temp: 101.3 F (38.5 C) 100.7 F (38.2 C)  102.1 F (38.9 C)  TempSrc: Rectal Axillary  Axillary  Resp: 18 18  18   Height:   6\' 2"  (1.88 m)   Weight:   93.9 kg (207 lb 0.2 oz)   SpO2: 96% 96%  98%    Intake/Output Summary (Last 24 hours) at 09/14/12 0947 Last data filed at 09/14/12 0612  Gross per 24 hour  Intake   1400 ml  Output    675 ml  Net    725 ml   Filed Weights   09/13/12  2228  Weight: 93.9 kg (207 lb 0.2 oz)    Exam:  General:  in no acute distress.  HEENT: No bruits, no goiter.  Heart: Regular rate and rhythm, without murmurs, rubs, gallops.  Lungs: Good air movement, clear to aucultation. Abdomen: Soft, nontender, nondistended, positive bowel sounds.  Neuro: Grossly intact, nonfocal.   Data Reviewed: Basic Metabolic Panel:  Recent Labs Lab 09/13/12 1556 09/14/12 0540  NA 133* 134*  K 5.1 4.6  CL 99 101  CO2 24 23  GLUCOSE 193* 174*  BUN 43* 42*  CREATININE 1.70* 1.44*  CALCIUM 9.3 9.1   Liver Function Tests:  Recent Labs Lab 09/13/12 1556 09/14/12 0540  AST 18 20  ALT 10 11  ALKPHOS 83 80  BILITOT 0.3 0.3  PROT 8.6* 8.3  ALBUMIN 2.1* 2.2*    Recent Labs Lab 09/13/12 1556  LIPASE 28   No results found for this basename: AMMONIA,  in the last 168 hours CBC:  Recent Labs Lab 09/13/12 1556 09/14/12 0540  WBC 21.7* 11.1*  NEUTROABS 17.9*  --   HGB 10.9* 10.1*  HCT 33.3* 31.6*  MCV 77.6* 77.1*  PLT 351 324   Cardiac Enzymes: No results found for this basename: CKTOTAL, CKMB, CKMBINDEX, TROPONINI,  in the last 168 hours BNP (last 3 results)  Recent Labs  09/13/12 1556  PROBNP 2027.0*   CBG:  Recent Labs Lab 09/14/12 0033 09/14/12 0437 09/14/12 0727  GLUCAP 150* 157* 152*    No results found for this or any previous visit (from the past 240 hour(s)).   Studies: Dg Chest Port 1 View  09/13/2012   *RADIOLOGY REPORT*  Clinical Data: Urinary tract infection.  History of hypertension and diabetes.  PORTABLE CHEST - 1 VIEW  Comparison: 05/06/2012.  Findings: 1536 hours.  Right subclavian pacemaker leads are unchanged within the right atrium and right ventricle.  The heart size and mediastinal contours are stable with aortic atherosclerosis.  The overall pulmonary aeration has improved. There is no edema, confluent airspace opacity or significant pleural effusion.  The patient is status post lower cervical  thoracic fusion.  IMPRESSION: No acute cardiopulmonary process.   Original Report Authenticated By: Carey Bullocks, M.D.    Scheduled Meds: . citalopram  10 mg Oral q morning - 10a  . enoxaparin (LOVENOX) injection  40 mg Subcutaneous QHS  . feeding supplement  30 mL Per Tube TID WC  . ferrous sulfate  300 mg Per Tube BID  . gabapentin  300 mg Per Tube QHS  . glycopyrrolate  1 mg Oral TID  . insulin aspart  0-9 Units Subcutaneous TID WC  . insulin glargine  20 Units Subcutaneous QHS  . levothyroxine  125 mcg Oral QAC breakfast  . liothyronine  25 mcg Oral q morning - 10a  . metoprolol tartrate  25 mg Per Tube BID  . piperacillin-tazobactam  3.375 g Intravenous Q8H  . [START ON 09/15/2012] scopolamine  1 patch Transdermal Q72H  . traZODone  25 mg Oral QHS  . vancomycin  1,250 mg Intravenous Q24H  . vitamin C  500 mg Per Tube BID   Continuous Infusions: . sodium chloride 100 mL/hr at 09/14/12 0106  . sodium chloride       Marinda Elk  [facility] Pager [phone]. If 8PM-8AM, please contact night-coverage at [email or web address], password [facility] 09/14/2012, 9:47 AM  LOS: 1 day

## 2012-09-14 NOTE — Consult Note (Signed)
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  S/P failed graft procedure approximately 1 year ago.  Wife and two other family members left the room after I explained what I was there to do; wife states she "saw wound approximately 3 years ago and does not wish to see again".  Family members state that they are "not allowed" to see wound. Wife reports that pat is on an air bed at his facility and that numerous other ulcers have healed in the past. Pressure Ulcer POA: Yes Measurement: Total area of involvement: 10cm x 15cm with deeper, oval ulcer in center measuring 4cm x 3cm x .5cm. Larger area is denuded on right side of deeper ulcer and dry on left side of deeper ulcer. Wound ZOX:WRUEA, 40 yellow slough, 60% pink, non-healing, non-granulating tissue.  Drainage (amount, consistency, odor) small amount of serosanguinous exudate on old dressing Periwound: As noted above, denuded tissue (moist, partial thickness) on right of ulcer, denuded, dry tissue on left of ulcerated area (partial thickness) Dressing procedure/placement/frequency: A silver hydrofiber dressing is ordered to fill the cavity and the topper dressing (a soft silicone foam dressing) is used to provide gentle cover for the periwound tissue.  It will not harm the left, dry tissue and it will provide gentle absorption for the right moist tissue.  The dressing will be change once daily and PRN for soiling or loosening of dressing edges.  An air overlay mattress is provided for pressure redistribution. Previously healed areas (scars) are noted on the heels, lower extremities.  If desired, further consultation may be requested of surgical services, e.g., CCS or plastics, but it is not emergent or may not even be indicated if other contributing factors to the admission are resolved sufficiently for patient to be returned to the facility. Wound care supplies are provided to the  bedside.  WOC ostomy consult  Stoma type/location: LLQ Colosotmy Stomal assessment/size: 2 and 1/4 inches, slightly elevated, pink, tacky mucosa. Os in center. Peristomal assessment: intact Treatment options for stomal/peristomal skin: None indicated Output: Green/brown stool Ostomy pouching: 1pc. Cut-to-fit ostomy pouch Hart [place] [phone]) used in conjunction with skin barrier ring [place] [phone]). Ostomy supplies are provided to bedside.  I will not follow but will remain available to this patient, the nursing staff and the medical team.  Please re-consult if needed. Thanks, Ladona Mow, MSN, RN, [facility], CWOCN [phone])

## 2012-09-14 NOTE — Progress Notes (Signed)
ANTIBIOTIC CONSULT NOTE - INITIAL  Pharmacy Consult for vancomycin Indication: sacral wound  No Known Allergies  Patient Measurements: Height: 6\' 2"  (188 cm) Weight: 207 lb 0.2 oz (93.9 kg) IBW/kg (Calculated) : 82.2 Adjusted Body Weight:   Vital Signs: Temp: 100.7 F (38.2 C) (05/31 2221) Temp src: Axillary (05/31 2221) BP: 134/80 mmHg (05/31 2221) Pulse Rate: 94 (05/31 2221) Intake/Output from previous day: 05/31 0701 - 06/01 0700 In: 1000 [I.V.:1000] Out: 475 [Urine:475] Intake/Output from this shift: Total I/O In: -  Out: 475 [Urine:475]  Labs:  Recent Labs  09/13/12 1556  WBC 21.7*  HGB 10.9*  PLT 351  CREATININE 1.70*   Estimated Creatinine Clearance: 43.7 ml/min (by C-G formula based on Cr of 1.7). No results found for this basename: VANCOTROUGH, VANCOPEAK, VANCORANDOM, GENTTROUGH, GENTPEAK, GENTRANDOM, TOBRATROUGH, TOBRAPEAK, TOBRARND, AMIKACINPEAK, AMIKACINTROU, AMIKACIN,  in the last 72 hours   Microbiology: No results found for this or any previous visit (from the past 720 hour(s)).  Medical History: Past Medical History  Diagnosis Date  . Stroke     Hx of with right hemiparesis  . Hypertension   . Endocarditis, mitral valve, syphilitic   . Hypothyroidism   . Anorexia   . Dysphagia   . Reflux   . Bacteremia   . Clostridium difficile colitis   . CVA (cerebral vascular accident)   . Pressure ulcer   . Diabetes mellitus, type 2   . Anemia   . Stroke     Medications:  Anti-infectives   Start     Dose/Rate Route Frequency Ordered Stop   09/14/12 2000  vancomycin (VANCOCIN) 1,250 mg in sodium chloride 0.9 % 250 mL IVPB     1,250 mg 166.7 mL/hr over 90 Minutes Intravenous Every 24 hours 09/14/12 0237     09/13/12 2230  vancomycin (VANCOCIN) IVPB 1000 mg/200 mL premix     1,000 mg 200 mL/hr over 60 Minutes Intravenous  Once 09/13/12 2229 09/14/12 0056   09/13/12 2230  piperacillin-tazobactam (ZOSYN) IVPB 3.375 g     3.375 g 100 mL/hr over  30 Minutes Intravenous  Once 09/13/12 2229 09/14/12 0135   09/13/12 1930  cefTRIAXone (ROCEPHIN) 1 g in dextrose 5 % 50 mL IVPB     1 g 100 mL/hr over 30 Minutes Intravenous  Once 09/13/12 1929 09/13/12 2035     Assessment: Patient with sacral wound.  First dose of antibiotics already given in ED.  Goal of Therapy:  Vancomycin trough level 15-20 mcg/ml  Plan:  Measure antibiotic drug levels at steady state Follow up culture results Vancomycin 1250mg  iv q24hr  Darlina Guys, Jacquenette Shone Crowford 09/14/2012,2:38 AM

## 2012-09-14 NOTE — Progress Notes (Signed)
Mr.Schlegel has been more verbal this evening than he has been since admission last night. I asked him if I could perform oral care and he said no. His mouth is very dry and lips are cracked. He coughed up a large amount of green mucus and again I asked him to please allow [place] clean his mouth. I finally took a swab and removed what was on his teeth. I will speak with the wife when she returns and ask for her assistance with his oral care. Overlay mattress has been placed as ordered and new dressing change has been placed as ordered by WOC RN.

## 2012-09-14 NOTE — Progress Notes (Signed)
RECEIVED CRITICAL LAB RESULT FROM [facility].  PT'S BLOOD CULTURES GROWING ANAEROBIC POSITIVE GRAM NEG RODS.  TEXT PAGE RESULT TO MD.

## 2012-09-15 DIAGNOSIS — R7881 Bacteremia: Secondary | ICD-10-CM | POA: Diagnosis present

## 2012-09-15 DIAGNOSIS — R4182 Altered mental status, unspecified: Secondary | ICD-10-CM

## 2012-09-15 DIAGNOSIS — E440 Moderate protein-calorie malnutrition: Secondary | ICD-10-CM | POA: Insufficient documentation

## 2012-09-15 LAB — GLUCOSE, CAPILLARY
Glucose-Capillary: 100 mg/dL — ABNORMAL HIGH (ref 70–99)
Glucose-Capillary: 107 mg/dL — ABNORMAL HIGH (ref 70–99)

## 2012-09-15 MED ORDER — ADULT MULTIVITAMIN LIQUID CH
5.0000 mL | Freq: Every day | ORAL | Status: DC
  Administered 2012-09-15 – 2012-09-23 (×9): 5 mL
  Filled 2012-09-15 (×9): qty 5

## 2012-09-15 MED ORDER — GLUCERNA 1.2 CAL PO LIQD
1000.0000 mL | ORAL | Status: DC
  Filled 2012-09-15: qty 1000

## 2012-09-15 MED ORDER — GLUCERNA 1.2 CAL PO LIQD
237.0000 mL | Freq: Three times a day (TID) | ORAL | Status: DC
  Administered 2012-09-15 – 2012-09-23 (×19): 237 mL
  Filled 2012-09-15 (×28): qty 237

## 2012-09-15 MED ORDER — GLUCERNA 1.2 CAL PO LIQD
1000.0000 mL | ORAL | Status: DC
  Administered 2012-09-15: 1000 mL

## 2012-09-15 MED ORDER — FREE WATER
60.0000 mL | Freq: Four times a day (QID) | Status: DC
  Administered 2012-09-15 – 2012-09-23 (×31): 60 mL

## 2012-09-15 NOTE — Progress Notes (Signed)
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  patient has been residing at skilled facility for past 3 years and is totally dependent for his care. Patient has PEG tube in place, chronic Foley catheter, colostomy bag in place. Patient has a large decubitus ulcer which had been from time. Patient had a flap done a year ago and the ulcer failed to heal at this time as per the wife patient is not a candidate for further surgery. Colostomy was done to prevent soiling of the ulcer. And is septic    Assessment/Plan: Bacteremia/Sepsis due to UTI and infected Decubitus ulcer of buttock, stage 4 : - Vanc. and zosyn 5.31.2014 - Blood cultures gram negative rods. Cont to have fevers and leukocytosis improving. - Blood pressure stable.  Leukocytosis/ Fever: - due to #1.  AKI: - due to sepsis cont IV fluids.   DM 2: - cont Lantus and SSI.  Code Status: full Family Communication: wife and children  Disposition Plan: inapteint   Consultants:  none  Procedures:  none  Antibiotics:  vanc and zosyn 5.31.2014 (indicate start date, and stop date if known)  HPI/Subjective: Pt more alert able to carry on a conversation.  Objective: Filed Vitals:   09/14/12 0611 09/14/12 1427 09/14/12 2225 09/15/12 0556  BP: 94/57 94/59 108/64 101/62  Pulse: 87 80 82 78  Temp: 102.1 F (38.9 C) 97.7 F (36.5 C) 98.6 F (37 C) 97.4 F (36.3 C)  TempSrc: Axillary Oral Oral Oral  Resp: 18 18 18 18   Height:      Weight:      SpO2: 98% 96% 97% 94%    Intake/Output Summary (Last 24 hours) at 09/15/12 0848 Last data filed at 09/15/12 0600  Gross per 24 hour  Intake   2100 ml  Output   2250 ml  Net   -150 ml   Filed Weights   09/13/12 2228  Weight: 93.9 kg (207 lb 0.2 oz)    Exam:  General: Alert, awake, oriented x3,  in no acute distress.  HEENT: No bruits, no goiter.  Heart: Regular rate and rhythm, without murmurs, rubs, gallops.  Lungs: Good air movement, clear to aucultation. Abdomen: Soft, nontender, nondistended, positive bowel sounds.  Neuro: Grossly intact, nonfocal.   Data Reviewed: Basic Metabolic Panel:  Recent Labs Lab 09/13/12 1556 09/14/12 0540  NA 133* 134*  K 5.1 4.6  CL 99 101  CO2 24 23  GLUCOSE 193* 174*  BUN 43* 42*  CREATININE 1.70* 1.44*  CALCIUM 9.3 9.1   Liver Function Tests:  Recent Labs Lab 09/13/12 1556 09/14/12 0540  AST 18 20  ALT 10 11  ALKPHOS 83 80  BILITOT 0.3 0.3  PROT 8.6* 8.3  ALBUMIN 2.1* 2.2*    Recent Labs Lab 09/13/12 1556  LIPASE 28   No results found for this basename: AMMONIA,  in the last 168 hours CBC:  Recent Labs Lab 09/13/12 1556 09/14/12 0540  WBC 21.7* 11.1*  NEUTROABS 17.9*  --   HGB 10.9* 10.1*  HCT 33.3* 31.6*  MCV 77.6* 77.1*  PLT 351 324   Cardiac Enzymes: No results found for this basename: CKTOTAL, CKMB, CKMBINDEX, TROPONINI,  in the last 168 hours BNP (last 3 results)  Recent Labs  09/13/12 1556  PROBNP 2027.0*   CBG:  Recent Labs Lab 09/14/12 1646 09/14/12 2111 09/14/12 2357 09/15/12 0358  09/15/12 0728  GLUCAP 121* 114* 114* 100* 100*    Recent Results (from the past 240 hour(s))  CULTURE, BLOOD (ROUTINE X 2)     Status: None   Collection Time    09/13/12  3:56 PM      Result Value Range Status   Specimen Description BLOOD RIGHT HAND   Final   Special Requests BOTTLES DRAWN AEROBIC AND ANAEROBIC 5CC   Final   Culture  Setup Time 09/13/2012 19:45   Final   Culture     Final   Value: GRAM NEGATIVE RODS     Note: Gram Stain Report Called to,Read Back By and Verified With: Gertie Gowda 09/14/12 @ 1:52PM BY RUSCA.   Report Status PENDING   Incomplete  CULTURE, BLOOD (ROUTINE X 2)     Status: None   Collection Time    09/13/12  4:41 PM      Result Value Range Status   Specimen  Description BLOOD RIGHT HAND   Final   Special Requests BOTTLES DRAWN AEROBIC ONLY 3CC   Final   Culture  Setup Time 09/13/2012 19:45   Final   Culture     Final   Value:        BLOOD CULTURE RECEIVED NO GROWTH TO DATE CULTURE WILL BE HELD FOR 5 DAYS BEFORE ISSUING A FINAL NEGATIVE REPORT   Report Status PENDING   Incomplete  URINE CULTURE     Status: None   Collection Time    09/13/12  6:02 PM      Result Value Range Status   Specimen Description URINE, CATHETERIZED   Final   Special Requests NONE   Final   Culture  Setup Time 09/13/2012 19:48   Final   Colony Count >=100,000 COLONIES/ML   Final   Culture     Final   Value: Multiple bacterial morphotypes present, none predominant. Suggest appropriate recollection if clinically indicated.   Report Status 09/14/2012 FINAL   Final     Studies: Dg Chest Port 1 View  09/13/2012   *RADIOLOGY REPORT*  Clinical Data: Urinary tract infection.  History of hypertension and diabetes.  PORTABLE CHEST - 1 VIEW  Comparison: 05/06/2012.  Findings: 1536 hours.  Right subclavian pacemaker leads are unchanged within the right atrium and right ventricle.  The heart size and mediastinal contours are stable with aortic atherosclerosis.  The overall pulmonary aeration has improved. There is no edema, confluent airspace opacity or significant pleural effusion.  The patient is status post lower cervical thoracic fusion.  IMPRESSION: No acute cardiopulmonary process.   Original Report Authenticated By: Carey Bullocks, M.D.    Scheduled Meds: . citalopram  10 mg Oral q morning - 10a  . enoxaparin (LOVENOX) injection  40 mg Subcutaneous QHS  . feeding supplement  30 mL Per Tube TID WC  . ferrous sulfate  300 mg Per Tube BID  . gabapentin  300 mg Per Tube QHS  . glycopyrrolate  1 mg Oral TID  . insulin aspart  0-9 Units Subcutaneous Q4H  . insulin glargine  20 Units Subcutaneous QHS  . levothyroxine  125 mcg Oral QAC breakfast  . liothyronine  25 mcg Oral q  morning - 10a  . metoprolol tartrate  25 mg Per Tube BID  . piperacillin-tazobactam  3.375 g Intravenous Q8H  . scopolamine  1 patch Transdermal Q72H  . traZODone  25 mg Oral QHS  . vancomycin  1,250 mg Intravenous Q24H  . vitamin C  500 mg  Per Tube BID   Continuous Infusions: . sodium chloride 100 mL/hr at 09/14/12 0106  . sodium chloride       Marinda Elk  [facility] Pager [phone]. If 8PM-8AM, please contact night-coverage at [email or web address], password [facility] 09/15/2012, 8:48 AM  LOS: 2 days

## 2012-09-15 NOTE — Progress Notes (Signed)
Clinical Social Work Department BRIEF PSYCHOSOCIAL ASSESSMENT 09/15/2012  [name], [name]     Account Number:  [id number]     Admit date:  09/13/2012  Clinical Social Worker:  Dennison Bulla  Date/Time:  09/15/2012 09:15 AM  Referred by:  Physician  Date Referred:  09/15/2012 Referred for  SNF Placement   Other Referral:   Interview type:  Family Other interview type:    PSYCHOSOCIAL DATA Living Status:  FACILITY Admitted from facility:  [facility] Level of care:  Skilled Nursing Facility Primary support name:  Wilma Primary support relationship to patient:  SPOUSE Degree of support available:   Strong    CURRENT CONCERNS Current Concerns  Post-Acute Placement   Other Concerns:    SOCIAL WORK ASSESSMENT / PLAN CSW received referral due to patient being admitted from a facility. CSW reviewed chart and attempted to meet with patient at bedside. Patient unable to fully participate in assessment so CSW spoke with wife via phone.    CSW introduced myself and explained role. Wife reports that patient has lived at [facility] for about 1 year. Wife agreeable to patient returning to SNF at DC. CSW explained DC process and agreeable to keep wife updated on plans.    CSW called SNF who is agreeable to accept patient when medically stable. SNF reports that have been billing Medicaid and will let CSW know if [facility] authorization is needed.    CSW completed FL2 and placed in shadow chart for MD signature.   Assessment/plan status:  Psychosocial Support/Ongoing Assessment of Needs Other assessment/ plan:   Information/referral to community resources:   Will return to SNF    PATIENT'S/FAMILY'S RESPONSE TO PLAN OF CARE: Patient did not speak with CSW. Wife engaged in assessment and agreeable for patient to return to SNF. Wife thanked CSW for phone call.

## 2012-09-15 NOTE — Progress Notes (Addendum)
INITIAL NUTRITION ASSESSMENT  Pt meets criteria for moderate MALNUTRITION in the context of chronic illness as evidenced by mild/moderate muscle wasting and subcutaneous fat loss in clavicles, temporal, and orbital region.  DOCUMENTATION CODES Per approved criteria  -Non-severe (moderate) malnutrition in the context of chronic illness   INTERVENTION: - Resume prior TF schedule of Glucerna 1.2 via PEG (substitute for Diabetisource) at 62ml/hr for 8 hours at night from 9pm-5am and 1 can bolus TID while pt remains NPO. Will order 60ml water flushes via PEG 4 times/day for tube patency.  - Initiate adult enteral protocol  - Diet advancement per MD - Multivitamin 5mL daily via PEG  - Will continue to monitor  NUTRITION DIAGNOSIS: Inadequate oral intake related to inability to eat as evidenced by NPO.   Goal: 1. TF tolerance, resume prior TF schedule 2. Advance diet as tolerated to regular/thin liquid diet  Monitor:  Weights, labs, diet advancement, TF tolerance  Reason for Assessment: Low braden, consult for TF management  77 y.o. male  Admitting Dx: Sepsis  ASSESSMENT: Pt with history of CVA with residual hemiparesis on the right, from skilled nursing facility where he has been at for 3 years and is totally dependent for his care. Pt with PEG, chronic foley catheter, and colostomy. Pt with stage 4 sacral decubitus ulcer. Met with pt who reports eating good at his facility and having TF but could not remember what kind was used. Noted pt with mild/moderate muscle wasting and subcutaneous fat loss in clavicles, orbital, and temporal region.   Per conversation with nursing home staff, pt was on a regular diet/no concentrated sweets/thin liquids and pt typically ate well and required someone to feed him. They report if pt ate <50% of meals, they would give him a bolus TF of Diabetisource. They report pt was getting Diabetisource at 26ml/hr for 8 hours at night from 9pm-5am which  provides 816 calories, 41g protein, free water with water flushes every 4 hours for a total daily fluid intake via PEG of water. Pt getting supplemental zinc and vitamin C.   Height: Ht Readings from Last 1 Encounters:  09/13/12 6\' 2"  (1.88 m)    Weight: Wt Readings from Last 1 Encounters:  09/13/12 207 lb 0.2 oz (93.9 kg)    Ideal Body Weight: 190 lb  % Ideal Body Weight: 109  Wt Readings from Last 10 Encounters:  09/13/12 207 lb 0.2 oz (93.9 kg)  05/04/12 244 lb 11.4 oz (111 kg)  04/17/12 210 lb 5.5 oz (95.412 kg)  02/12/12 210 lb 1.6 oz (95.3 kg)  06/18/11 219 lb (99.338 kg)  05/22/11 210 lb (95.255 kg)  02/20/11 219 lb 12.8 oz (99.7 kg)    Usual Body Weight: 210-219 lb  % Usual Body Weight: 95-98  BMI:  Body mass index is 26.57 kg/(m^2).  Estimated Nutritional Needs: Kcal: 2350-2650 Protein: 130-160g Fluid: 2.3-2.6L/day  Skin: Stage IV sacral pressure ulcer, + 1 RLE, LLE edema, + 2 sacral edema  Diet Order: NPO  EDUCATION NEEDS: -No education needs identified at this time   Intake/Output Summary (Last 24 hours) at 09/15/12 1258 Last data filed at 09/15/12 0600  Gross per 24 hour  Intake   1600 ml  Output   2250 ml  Net   -650 ml    Last BM: 6/2  Labs:   Recent Labs Lab 09/13/12 1556 09/14/12 0540  NA 133* 134*  K 5.1 4.6  CL 99 101  CO2 24 23  BUN 43* 42*  CREATININE 1.70* 1.44*  CALCIUM 9.3 9.1  GLUCOSE 193* 174*    CBG (last 3)   Recent Labs  09/15/12 0358 09/15/12 0728 09/15/12 1152  GLUCAP 100* 100* 97    Scheduled Meds: . citalopram  10 mg Oral q morning - 10a  . enoxaparin (LOVENOX) injection  40 mg Subcutaneous QHS  . feeding supplement  30 mL Per Tube TID WC  . ferrous sulfate  300 mg Per Tube BID  . gabapentin  300 mg Per Tube QHS  . glycopyrrolate  1 mg Oral TID  . insulin aspart  0-9 Units Subcutaneous Q4H  . insulin glargine  20 Units Subcutaneous QHS  . levothyroxine  125 mcg Oral QAC  breakfast  . liothyronine  25 mcg Oral q morning - 10a  . metoprolol tartrate  25 mg Per Tube BID  . piperacillin-tazobactam  3.375 g Intravenous Q8H  . scopolamine  1 patch Transdermal Q72H  . traZODone  25 mg Oral QHS  . vancomycin  1,250 mg Intravenous Q24H  . vitamin C  500 mg Per Tube BID    Continuous Infusions: . sodium chloride 100 mL/hr at 09/14/12 0106  . sodium chloride      Past Medical History  Diagnosis Date  . Stroke     Hx of with right hemiparesis  . Hypertension   . Endocarditis, mitral valve, syphilitic   . Hypothyroidism   . Anorexia   . Dysphagia   . Reflux   . Bacteremia   . Clostridium difficile colitis   . CVA (cerebral vascular accident)   . Pressure ulcer   . Diabetes mellitus, type 2   . Anemia   . Stroke     Past Surgical History  Procedure Laterality Date  . Colostomy    . Peg placement    . Hernia repair       Levon Hedger MS, RD, LDN [phone] Pager [phone] After Hours Pager

## 2012-09-15 NOTE — Progress Notes (Signed)
Patient turned and repositioned on right side, sacral wound cleansed and redressed per instructions

## 2012-09-16 DIAGNOSIS — R509 Fever, unspecified: Secondary | ICD-10-CM

## 2012-09-16 LAB — BASIC METABOLIC PANEL
BUN: 25 mg/dL — ABNORMAL HIGH (ref 6–23)
CO2: 23 mEq/L (ref 19–32)
Chloride: 109 mEq/L (ref 96–112)
Creatinine, Ser: 1.12 mg/dL (ref 0.50–1.35)
GFR calc Af Amer: 72 mL/min — ABNORMAL LOW (ref >90)

## 2012-09-16 LAB — GLUCOSE, CAPILLARY
Glucose-Capillary: 120 mg/dL — ABNORMAL HIGH (ref 70–99)
Glucose-Capillary: 60 mg/dL — ABNORMAL LOW (ref 70–99)
Glucose-Capillary: 63 mg/dL — ABNORMAL LOW (ref 70–99)
Glucose-Capillary: 70 mg/dL (ref 70–99)
Glucose-Capillary: 92 mg/dL (ref 70–99)
Glucose-Capillary: 94 mg/dL (ref 70–99)

## 2012-09-16 LAB — CULTURE, BLOOD (ROUTINE X 2)

## 2012-09-16 MED ORDER — SODIUM CHLORIDE 0.9 % IV SOLN
500.0000 mg | Freq: Three times a day (TID) | INTRAVENOUS | Status: DC
  Administered 2012-09-16 – 2012-09-17 (×4): 500 mg via INTRAVENOUS
  Filled 2012-09-16 (×5): qty 500

## 2012-09-16 MED ORDER — DIPHENHYDRAMINE HCL 25 MG PO CAPS
25.0000 mg | ORAL_CAPSULE | ORAL | Status: DC | PRN
  Administered 2012-09-16: 25 mg via ORAL
  Filled 2012-09-16: qty 1

## 2012-09-16 MED ORDER — DIPHENHYDRAMINE HCL 50 MG/ML IJ SOLN
25.0000 mg | Freq: Once | INTRAMUSCULAR | Status: AC
  Administered 2012-09-16: 25 mg via INTRAVENOUS
  Filled 2012-09-16: qty 1

## 2012-09-16 MED ORDER — GLUCERNA 1.2 CAL PO LIQD
1000.0000 mL | ORAL | Status: DC
  Administered 2012-09-16 – 2012-09-22 (×7): 1000 mL
  Filled 2012-09-16 (×7): qty 1000

## 2012-09-16 MED ORDER — DEXTROSE 50 % IV SOLN: 25.0000 mL | Freq: Once | INTRAVENOUS | Status: AC | PRN

## 2012-09-16 MED ORDER — DEXTROSE 50 % IV SOLN
25.0000 mL | Freq: Once | INTRAVENOUS | Status: AC | PRN
  Administered 2012-09-16: 25 mL via INTRAVENOUS

## 2012-09-16 MED ORDER — PRO-STAT SUGAR FREE PO LIQD
30.0000 mL | Freq: Three times a day (TID) | ORAL | Status: DC
  Administered 2012-09-16 – 2012-09-23 (×17): 30 mL
  Filled 2012-09-16 (×23): qty 30

## 2012-09-16 NOTE — Progress Notes (Signed)
cbg = 70, given D50 25 mg IV, CBG recheck was 130, patient repositioned on right side, foley and colostomy emptied.  Notified Dr. Robb Matar via text

## 2012-09-16 NOTE — Progress Notes (Signed)
13 ml residual from peg tube.  Low blood sugar of 60 before glucerna and then recheck at 63 then  treated with D50 blood sugar = 120.  Patient resting comfortably, wife in room, oral care given

## 2012-09-16 NOTE — Progress Notes (Addendum)
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  patient has been residing at skilled facility for past 3 years and is totally dependent for his care. Patient has PEG tube in place, chronic Foley catheter, colostomy bag in place. Patient has a large decubitus ulcer which had been from time. Patient had a flap done a year ago and the ulcer failed to heal at this time as per the wife patient is not a candidate for further surgery. Colostomy was done to prevent soiling of the ulcer. Was found to be bacteremic with [facility] growing gram negative rods is currently on vanc and zosyn. De-escalte to zosyn.    Assessment/Plan: Bacteremia/Sepsis due to UTI and infected Decubitus ulcer of buttock, stage 4 : - Vanc. and zosyn 5.31.2014, change to primaxim as BC grew ESBL -  leukocytosis improving. De escalate to primaxim. - Blood pressure stable. - Wound care consulted. - Patient wants aggressive treatment.  Leukocytosis/ Fever: - due to #1.  AKI: - due to sepsis cont IV fluids. - b-met pending.   DM 2: - cont Lantus and SSI.  Acute confusional state: - due to infectious etiology and medication. - avoid narcotics bzd's and benadryl.  Code Status: full Family Communication: wife and children  Disposition Plan: inapteint   Consultants:  none  Procedures:  none  Antibiotics:  vanc and zosyn 5.31.2014 (indicate start date, and stop date if known)  HPI/Subjective: Pt more alert able to carry on a conversation. Wife relates he has periods of confusion.  Objective: Filed Vitals:   09/15/12 0556 09/15/12 1548 09/15/12 2100 09/16/12 0437  BP: 101/62 105/52 102/57 108/63  Pulse: 78 72 75 70  Temp: 97.4 F (36.3 C) 98.2 F (36.8 C) 97.4 F (36.3 C) 98.7 F (37.1 C)  TempSrc: Oral Oral Oral Axillary  Resp: 18 16 18  16   Height:      Weight:      SpO2: 94% 100% 98% 95%    Intake/Output Summary (Last 24 hours) at 09/16/12 0941 Last data filed at 09/16/12 0600  Gross per 24 hour  Intake   3640 ml  Output   1200 ml  Net   2440 ml   Filed Weights   09/13/12 2228  Weight: 93.9 kg (207 lb 0.2 oz)    Exam:  General: Alert, awake, oriented x2, in no acute distress.  HEENT: No bruits, no goiter.  Heart: Regular rate and rhythm, without murmurs, rubs, gallops.  Lungs: Good air movement, clear to aucultation. Abdomen: Soft, nontender, nondistended, positive bowel sounds.  Neuro: Grossly intact, nonfocal.   Data Reviewed: Basic Metabolic Panel:  Recent Labs Lab 09/13/12 1556 09/14/12 0540  NA 133* 134*  K 5.1 4.6  CL 99 101  CO2 24 23  GLUCOSE 193* 174*  BUN 43* 42*  CREATININE 1.70* 1.44*  CALCIUM 9.3 9.1   Liver Function Tests:  Recent Labs Lab 09/13/12 1556 09/14/12 0540  AST 18 20  ALT 10 11  ALKPHOS 83 80  BILITOT 0.3 0.3  PROT 8.6* 8.3  ALBUMIN 2.1* 2.2*    Recent Labs Lab 09/13/12 1556  LIPASE 28   No results found for this basename: AMMONIA,  in the last 168 hours CBC:  Recent Labs Lab 09/13/12 1556 09/14/12 0540  WBC 21.7* 11.1*  NEUTROABS 17.9*  --   HGB 10.9* 10.1*  HCT 33.3* 31.6*  MCV 77.6* 77.1*  PLT 351 324   Cardiac Enzymes: No results found for this basename: CKTOTAL, CKMB, CKMBINDEX, TROPONINI,  in the last 168 hours BNP (last 3 results)  Recent Labs  09/13/12 1556  PROBNP 2027.0*   CBG:  Recent Labs Lab 09/15/12 2355 09/16/12 0354 09/16/12 0734 09/16/12 0828 09/16/12 0913  GLUCAP 96 86 60* 63* 120*    Recent Results (from the past 240 hour(s))  CULTURE, BLOOD (ROUTINE X 2)     Status: None   Collection Time    09/13/12  3:56 PM      Result Value Range Status   Specimen Description BLOOD RIGHT HAND   Final   Special Requests BOTTLES DRAWN AEROBIC AND ANAEROBIC 5CC   Final   Culture  Setup Time 09/13/2012 19:45   Final    Culture     Final   Value: GRAM NEGATIVE RODS     Note: Gram Stain Report Called to,Read Back By and Verified With: Gertie Gowda 09/14/12 @ 1:52PM BY RUSCA.   Report Status PENDING   Incomplete  CULTURE, BLOOD (ROUTINE X 2)     Status: None   Collection Time    09/13/12  4:41 PM      Result Value Range Status   Specimen Description BLOOD RIGHT HAND   Final   Special Requests BOTTLES DRAWN AEROBIC ONLY 3CC   Final   Culture  Setup Time 09/13/2012 19:45   Final   Culture     Final   Value:        BLOOD CULTURE RECEIVED NO GROWTH TO DATE CULTURE WILL BE HELD FOR 5 DAYS BEFORE ISSUING A FINAL NEGATIVE REPORT   Report Status PENDING   Incomplete  URINE CULTURE     Status: None   Collection Time    09/13/12  6:02 PM      Result Value Range Status   Specimen Description URINE, CATHETERIZED   Final   Special Requests NONE   Final   Culture  Setup Time 09/13/2012 19:48   Final   Colony Count >=100,000 COLONIES/ML   Final   Culture     Final   Value: Multiple bacterial morphotypes present, none predominant. Suggest appropriate recollection if clinically indicated.   Report Status 09/14/2012 FINAL   Final     Studies: No results found.  Scheduled Meds: . citalopram  10 mg Oral q morning - 10a  . enoxaparin (LOVENOX) injection  40 mg Subcutaneous QHS  . feeding supplement (GLUCERNA 1.2 CAL)  1,000 mL Per Tube Q24H  . feeding supplement (GLUCERNA 1.2 CAL)  237 mL Per Tube TID PC  . ferrous sulfate  300 mg Per Tube BID  . free water  60 mL Per Tube Q6H  . gabapentin  300 mg Per Tube QHS  . glycopyrrolate  1 mg Oral TID  . insulin aspart  0-9 Units Subcutaneous Q4H  . insulin glargine  20 Units Subcutaneous QHS  . levothyroxine  125 mcg Oral QAC breakfast  . liothyronine  25 mcg Oral q morning - 10a  . metoprolol tartrate  25 mg Per Tube BID  . multivitamin  5 mL Per Tube Daily  . piperacillin-tazobactam  3.375 g Intravenous Q8H  . scopolamine  1 patch Transdermal Q72H  . traZODone   25 mg Oral QHS  . vancomycin  1,250 mg Intravenous Q24H  . vitamin C  500 mg Per Tube BID   Continuous Infusions: . sodium chloride 20 mL (09/16/12 0750)  .  sodium chloride       Marinda Elk  [facility] Pager [phone]. If 8PM-8AM, please contact night-coverage at [email or web address], password [facility] 09/16/2012, 9:41 AM  LOS: 3 days

## 2012-09-16 NOTE — Progress Notes (Signed)
Nutrition Brief Note  Intervention: - Recommend diet advancement to regular diet per MD - Will order Prostat 30ml TID via PEG while pt remains NPO and increase night TF to Glucerna 1.2 at 85ml for 10 hours for additional calories/protein. This plus bolus TF administration will provide a total of 2175 calories, 139g protein meeting 92% estimated calorie needs and 107% estimated protein needs   Diet: NPO  TF: Glucerna 1.2 at 67ml/hr x 8 hours at night-time plus Glucerna 1.2 1 can TID provides - 1671 calories, 83g protein, free water meeting 71% estimated calorie needs, 64% estimated protein needs  Pt asleep during visit, did not disturb. Per RN pt tolerated bolus TF well yesterday and had less than 10ml of residuals from night time continuous TF. Will continue to monitor.   Levon Hedger MS, RD, LDN [phone] Pager [phone] After Hours Pager

## 2012-09-16 NOTE — Progress Notes (Signed)
Pt tolerated glucena tube feed 2100-0500. resiudals <10cc

## 2012-09-16 NOTE — Progress Notes (Signed)
Clinical Social Work  CSW received a call from [employer] and SNF was inquiring about patient's DC date. CSW spoke with MD who reports that patient should be ready towards the end of the week. CSW updated SNF and sent updated clinicals for SNF to review. CSW will continue to follow.  [place], [place] [phone]

## 2012-09-17 DIAGNOSIS — I059 Rheumatic mitral valve disease, unspecified: Secondary | ICD-10-CM

## 2012-09-17 DIAGNOSIS — R7881 Bacteremia: Secondary | ICD-10-CM

## 2012-09-17 DIAGNOSIS — L89109 Pressure ulcer of unspecified part of back, unspecified stage: Secondary | ICD-10-CM

## 2012-09-17 DIAGNOSIS — L8990 Pressure ulcer of unspecified site, unspecified stage: Secondary | ICD-10-CM

## 2012-09-17 DIAGNOSIS — B961 Klebsiella pneumoniae [K. pneumoniae] as the cause of diseases classified elsewhere: Secondary | ICD-10-CM

## 2012-09-17 LAB — URINE MICROSCOPIC-ADD ON

## 2012-09-17 LAB — GLUCOSE, CAPILLARY
Glucose-Capillary: 108 mg/dL — ABNORMAL HIGH (ref 70–99)
Glucose-Capillary: 113 mg/dL — ABNORMAL HIGH (ref 70–99)
Glucose-Capillary: 109 mg/dL — ABNORMAL HIGH (ref 70–99)
Glucose-Capillary: 104 mg/dL — ABNORMAL HIGH (ref 70–99)

## 2012-09-17 LAB — URINALYSIS, ROUTINE W REFLEX MICROSCOPIC
Ketones, ur: NEGATIVE mg/dL
Nitrite: NEGATIVE
Protein, ur: NEGATIVE mg/dL
Urobilinogen, UA: 0.2 mg/dL (ref 0.0–1.0)
pH: 8.5 — ABNORMAL HIGH (ref 5.0–8.0)

## 2012-09-17 MED ORDER — POTASSIUM CHLORIDE 20 MEQ/15ML (10%) PO LIQD
40.0000 meq | Freq: Once | ORAL | Status: AC
  Administered 2012-09-17: 40 meq via ORAL
  Filled 2012-09-17: qty 30

## 2012-09-17 MED ORDER — SODIUM CHLORIDE 0.9 % IV SOLN
1.0000 g | INTRAVENOUS | Status: DC
  Administered 2012-09-17 – 2012-09-22 (×6): 1 g via INTRAVENOUS
  Filled 2012-09-17 (×7): qty 1

## 2012-09-17 NOTE — Consult Note (Signed)
[facility]    Date of Admission:  09/13/2012  Date of Consult:  09/17/2012  Reason for Consult: ESBL bacteremia in pt with  Pacemaker, prior MV endocarditis , decubitus ulcer  Referring Physician: Dr. Thedore Mins   HPI: [name] is an 77 y.o. 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 The patient has an apparent history of mitral valve endocarditis and has also had a permanent PM placed for bradycardia. He has had diverting colostomy performed at [facility] to protect his large decubitus ulcer. nd and indwelling foley catheter.     Patient had a flap done a year ago and the ulcer failed to heal and  this time as per the wife patient is not a candidate for further surgery.  He was brought to the hospital for fever, hypotension on 09/13/12. Admission cultures are growing ESBL Klebsiella PNA in 2/2 blood cultures. Urine cx is with >100 mixed flora. He has had no abdominal ssx and he believes his ulcer has not worsened.   He also notably has a history of prior C difficile colitis.  He has been changed from admission abx to IV imipenem and seems to have stabilized.   We were consulted to assist in the management work up of this patient with extended spectrum beta-lactamase ESBL  a without a clear-cut cause who also has a pacemaker and prior history of mitral valve endocarditis.       Past Medical History  Diagnosis Date  . Stroke     Hx of with right hemiparesis  . Hypertension   . Endocarditis, mitral valve, syphilitic   . Hypothyroidism   . Anorexia   . Dysphagia   . Reflux   . Bacteremia   . Clostridium difficile colitis   . CVA (cerebral vascular accident)   . Pressure ulcer   . Diabetes mellitus, type 2   . Anemia   . Stroke     Past Surgical History  Procedure Laterality Date  . Colostomy    . Peg placement    . Hernia repair    ergies:   No Known  Allergies   Medications: I have reviewed patients current medications as documented in Epic Anti-infectives   Start     Dose/Rate Route Frequency Ordered Stop   09/16/12 1400  imipenem-cilastatin (PRIMAXIN) 500 mg in sodium chloride 0.9 % 100 mL IVPB     500 mg 200 mL/hr over 30 Minutes Intravenous 3 times per day 09/16/12 1020     09/14/12 2000  vancomycin (VANCOCIN) 1,250 mg in sodium chloride 0.9 % 250 mL IVPB  Status:  Discontinued     1,250 mg 166.7 mL/hr over 90 Minutes Intravenous Every 24 hours 09/14/12 0237 09/16/12 0942   09/14/12 1000  piperacillin-tazobactam (ZOSYN) IVPB 3.375 g  Status:  Discontinued     3.375 g 12.5 mL/hr over 240 Minutes Intravenous Every 8 hours 09/14/12 0906 09/16/12 1020   09/13/12 2230  vancomycin (VANCOCIN) IVPB 1000 mg/200 mL premix     1,000 mg 200 mL/hr over 60 Minutes Intravenous  Once 09/13/12 2229 09/14/12 0056   09/13/12 2230  piperacillin-tazobactam (ZOSYN) IVPB 3.375 g     3.375 g 100 mL/hr over 30 Minutes Intravenous  Once 09/13/12 2229 09/14/12 0135   09/13/12 1930  cefTRIAXone (ROCEPHIN) 1 g in dextrose 5 % 50 mL IVPB     1 g 100 mL/hr over  30 Minutes Intravenous  Once 09/13/12 1929 09/13/12 2035      Social History:  reports that he has never smoked. He does not have any smokeless tobacco history on file. He reports that he does not drink alcohol or use illicit drugs.  Family History  Problem Relation Age of Onset  . Cancer Mother   . Other Father     old age after a fall  . Alzheimer's disease Sister     As in HPI and primary teams notes otherwise 12 point review of systems is negative  Blood pressure 127/72, pulse 64, temperature 98.4 F (36.9 C), temperature source Oral, resp. rate 20, height 6\' 2"  (1.88 m), weight 207 lb 0.2 oz (93.9 kg), SpO2 96.00%. General: Alert and awake, oriented x3, not in any acute distress. HEENT: anicteric sclera,, EOMI, oropharynx clear and without exudate CVS regular rate, normal r,  no  murmur rubs or gallops Chest: clear to auscultation bilaterally, no wheezing, rales or rhonchi PM site clean Abdomen: soft nontender, nondistended, normal bowel sounds, ostlomy with liquid brown stool Extremities: His heels have stage II decubitus ulcers present with scaling. Examination of the sacral area reveals a large stage IV decubitus ulcer. There is no obvious purulence from the wound. Skin: no rashes Neuro: right sided hemiparesis    Results for orders placed during the hospital encounter of 09/13/12 (from the past 48 hour(s))  GLUCOSE, CAPILLARY     Status: None   Collection Time    09/15/12  3:32 PM      Result Value Range   Glucose-Capillary 89  70 - 99 mg/dL  GLUCOSE, CAPILLARY     Status: Abnormal   Collection Time    09/15/12  7:53 PM      Result Value Range   Glucose-Capillary 107 (*) 70 - 99 mg/dL  GLUCOSE, CAPILLARY     Status: None   Collection Time    09/15/12 11:55 PM      Result Value Range   Glucose-Capillary 96  70 - 99 mg/dL  GLUCOSE, CAPILLARY     Status: None   Collection Time    09/16/12  3:54 AM      Result Value Range   Glucose-Capillary 86  70 - 99 mg/dL  GLUCOSE, CAPILLARY     Status: Abnormal   Collection Time    09/16/12  7:34 AM      Result Value Range   Glucose-Capillary 60 (*) 70 - 99 mg/dL   Comment 1 Notify RN    GLUCOSE, CAPILLARY     Status: Abnormal   Collection Time    09/16/12  8:28 AM      Result Value Range   Glucose-Capillary 63 (*) 70 - 99 mg/dL   Comment 1 Notify RN    GLUCOSE, CAPILLARY     Status: Abnormal   Collection Time    09/16/12  9:13 AM      Result Value Range   Glucose-Capillary 120 (*) 70 - 99 mg/dL   Comment 1 Notify RN    BASIC METABOLIC PANEL     Status: Abnormal   Collection Time    09/16/12 10:00 AM      Result Value Range   Sodium 141  135 - 145 mEq/L   Potassium 3.4 (*) 3.5 - 5.1 mEq/L   Chloride 109  96 - 112 mEq/L   CO2 23  19 - 32 mEq/L   Glucose, Bld 97  70 - 99 mg/dL   BUN 25 (*)  6 - 23  mg/dL   Creatinine, Ser 1.61  0.50 - 1.35 mg/dL   Calcium 8.9  8.4 - 09.6 mg/dL   GFR calc non Af Amer 62 (*) >90 mL/min   GFR calc Af Amer 72 (*) >90 mL/min   Comment:            The eGFR has been calculated     using the CKD EPI equation.     This calculation has not been     validated in all clinical     situations.     eGFR's persistently     <90 mL/min signify     possible Chronic Kidney Disease.  GLUCOSE, CAPILLARY     Status: None   Collection Time    09/16/12 11:34 AM      Result Value Range   Glucose-Capillary 70  70 - 99 mg/dL  GLUCOSE, CAPILLARY     Status: Abnormal   Collection Time    09/16/12 11:47 AM      Result Value Range   Glucose-Capillary 130 (*) 70 - 99 mg/dL   Comment 1 Notify RN    GLUCOSE, CAPILLARY     Status: None   Collection Time    09/16/12  4:29 PM      Result Value Range   Glucose-Capillary 92  70 - 99 mg/dL   Comment 1 Notify RN    GLUCOSE, CAPILLARY     Status: None   Collection Time    09/16/12  8:21 PM      Result Value Range   Glucose-Capillary 93  70 - 99 mg/dL   Comment 1 Notify RN    GLUCOSE, CAPILLARY     Status: None   Collection Time    09/16/12 11:58 PM      Result Value Range   Glucose-Capillary 94  70 - 99 mg/dL  GLUCOSE, CAPILLARY     Status: Abnormal   Collection Time    09/17/12  4:03 AM      Result Value Range   Glucose-Capillary 108 (*) 70 - 99 mg/dL   Comment 1 Documented in Chart     Comment 2 Notify RN    GLUCOSE, CAPILLARY     Status: Abnormal   Collection Time    09/17/12  8:16 AM      Result Value Range   Glucose-Capillary 113 (*) 70 - 99 mg/dL   Comment 1 Notify RN        Component Value Date/Time   SDES URINE, CATHETERIZED 09/13/2012 1802   SPECREQUEST NONE 09/13/2012 1802   CULT Multiple bacterial morphotypes present, none predominant. Suggest appropriate recollection if clinically indicated. 09/13/2012 1802   REPTSTATUS 09/14/2012 FINAL 09/13/2012 1802   No results found.   Recent Results (from  the past 720 hour(s))  CULTURE, BLOOD (ROUTINE X 2)     Status: None   Collection Time    09/13/12  3:56 PM      Result Value Range Status   Specimen Description BLOOD RIGHT HAND   Final   Special Requests BOTTLES DRAWN AEROBIC AND ANAEROBIC 5CC   Final   Culture  Setup Time 09/13/2012 19:45   Final   Culture     Final   Value: KLEBSIELLA PNEUMONIAE     Note: Confirmed Extended Spectrum Beta-Lactamase Producer (ESBL) CRITICAL RESULT CALLED TO, READ BACK BY AND VERIFIED WITH: CYNTHIA WHITE 09/16/12 0950 BY SMITHERSJ     Note: Gram Stain Report Called to,Read Back By and  Verified With: Gertie Gowda 09/14/12 @ 1:52PM BY RUSCA.   Report Status 09/16/2012 FINAL   Final   Organism ID, Bacteria KLEBSIELLA PNEUMONIAE   Final  CULTURE, BLOOD (ROUTINE X 2)     Status: None   Collection Time    09/13/12  4:41 PM      Result Value Range Status   Specimen Description BLOOD RIGHT HAND   Final   Special Requests BOTTLES DRAWN AEROBIC ONLY 3CC   Final   Culture  Setup Time 09/13/2012 19:45   Final   Culture     Final   Value:        BLOOD CULTURE RECEIVED NO GROWTH TO DATE CULTURE WILL BE HELD FOR 5 DAYS BEFORE ISSUING A FINAL NEGATIVE REPORT   Report Status PENDING   Incomplete  URINE CULTURE     Status: None   Collection Time    09/13/12  6:02 PM      Result Value Range Status   Specimen Description URINE, CATHETERIZED   Final   Special Requests NONE   Final   Culture  Setup Time 09/13/2012 19:48   Final   Colony Count >=100,000 COLONIES/ML   Final   Culture     Final   Value: Multiple bacterial morphotypes present, none predominant. Suggest appropriate recollection if clinically indicated.   Report Status 09/14/2012 FINAL   Final     Impression/Recommendation  77 year old man with history of prior mitral valve endocarditis pacemaker placement for bradycardia a stroke with , large stage IV decubitus ulcer that failed graft placement now admitted with extended spectrum beta-lactamase producing  Klebsiella pneumonia bacteremia without clear cut cause. He also has a notable history for Clostridium difficile colitis.  #1 extended spectrum beta-lactamase producing Klebsiella pneumonia bacteremia: Urine could have been the source although it is dissatisfying that multiple organism isolate on urine culture. Additional portal of entry would include his very large sacral decubitus ulcer under which he may likely have chronic osteomyelitis. Additionally he could have an intra-abdominal source of infection. Finally given the fact that he has a pacemaker there is additional concern for the risk of pacemaker infection and even heart valve infection although it is not typical to see that happen with this particular organism treating it could such an endocvascular infection could be  highly problematic given the resistant nature of the organism.  --I'll recheck blood cultures --Continue carbapenem therapy and one could consider simplify him to once daily ertapenem -- Follow up results of 2-D echocardiogram --I. would like to get a CT of the abdomen and pelvis with oral and if possible IV contrast to evaluate for intra-abdominal sources of infection and to get better images of his decubitus ulcer. --Will check a sedimentation rate and C-reactive protein. --I do think we should consider obtaining a transesophageal echocardiogram  #2 large sacral decubitus ulcer: Could be source of infection again would like imaging  #3 screening all screening for HIV and hepatitis C if this has not been done.  Thank you so much for this interesting consult  [facility] [phone] (pager) [phone] (office) 09/17/2012, 3:27 PM  Paulette Blanch [place] 09/17/2012, 3:27 PM

## 2012-09-17 NOTE — Progress Notes (Addendum)
[facility]                                                                                Patient Demographics  [name], is a 77 y.o. male, DOB - 07-07-1935, WUJ:[id number], NFA:[id number]  Admit date - 09/13/2012  Admitting Physician Meredeth Ide, MD  Outpatient Primary MD for the patient is HASSELL Angelique Blonder, MD  LOS - 4   Chief Complaint  Patient presents with  . 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  patient has been residing at skilled facility for past 3 years and is totally dependent for his care. Patient has PEG tube in place, chronic Foley catheter, colostomy bag in place. Patient has a large coccygeal decubitus ulcer which had been from time. Patient had a flap done a year ago and the ulcer failed to heal at this time as per the wife patient is not a candidate for further surgery. Colostomy was done to prevent soiling of the ulcer.     Assessment and Plan   1. Sepsis with Klebsiella bacteremia likely source urine as patient has chronic indwelling Foley catheter. His stage IV coccygeal decubitus ulcer does not appear infected, he however does have prosthetic mitral valve with previous history of endocarditis - for now he will be continued on Primaxin, I have requested ID to provide input on duration of IV antibiotics in the light of bacteremia, 2-D echogram has been ordered, repeat UA and culture has been ordered, we'll wait ID for any further workup recommendations. His Foley catheter was changed during this admission.    2. Acute renal insufficiency. Due to sepsis from #1, resolved after IV fluids.    3. Diabetes mellitus type 2. Continue on Lantus and sliding-scale insulin  Lab Results  Component Value Date   HGBA1C 8.7* 04/13/2012    CBG  (last 3)   Recent Labs  09/16/12 2021 09/16/12 2358 09/17/12 0403  GLUCAP 93 94 108*     4. 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 Wound care following, coccygeal ulcer does not appear to be infected.    5. Straight of CVA with right-sided hemiparesis which is chronic, chronic bedbound status, chronic Foley catheter, PEG . - Continue supportive care, feeding, meds and free water through PEG tube, outpatient followup with PCP post discharge.   6. Hypothyroidism continue home dose Synthroid.   7. Malnutrition of moderate degree - continue nutritional supplementation.     Code Status: full  Family Communication: wife  Disposition Plan: SNF   Procedures TTE   Consults  ID, W care   DVT Prophylaxis  Lovenox    Lab Results  Component Value Date   PLT 324 09/14/2012    Medications  Scheduled Meds: . citalopram  10 mg Oral q morning - 10a  . enoxaparin (LOVENOX) injection  40 mg Subcutaneous QHS  . feeding supplement (GLUCERNA 1.2 CAL)  1,000 mL Per Tube Q24H  . feeding supplement (GLUCERNA 1.2 CAL)  237 mL Per Tube TID PC  . feeding supplement  30 mL Per Tube TID  . ferrous sulfate  300 mg Per Tube BID  . free water  60 mL Per Tube Q6H  . gabapentin  300 mg Per Tube QHS  . glycopyrrolate  1 mg Oral TID  . imipenem-cilastatin  500 mg Intravenous Q8H  . insulin aspart  0-9 Units Subcutaneous Q4H  . insulin glargine  20 Units Subcutaneous QHS  . levothyroxine  125 mcg Oral QAC breakfast  . liothyronine  25 mcg Oral q morning - 10a  . metoprolol tartrate  25 mg Per Tube BID  . multivitamin  5 mL Per Tube Daily  . potassium chloride  40 mEq Oral Once  . scopolamine  1 patch Transdermal Q72H  . traZODone  25 mg Oral QHS  . vitamin C  500 mg Per Tube BID   Continuous Infusions: . sodium chloride 20 mL (09/16/12 0750)  . sodium chloride     PRN Meds:.acetaminophen (TYLENOL) oral liquid 160 mg/5 mL, diphenhydrAMINE,  traMADol  Antibiotics     Anti-infectives   Start     Dose/Rate Route Frequency Ordered Stop   09/16/12 1400  imipenem-cilastatin (PRIMAXIN) 500 mg in sodium chloride 0.9 % 100 mL IVPB     500 mg 200 mL/hr over 30 Minutes Intravenous 3 times per day 09/16/12 1020     09/14/12 2000  vancomycin (VANCOCIN) 1,250 mg in sodium chloride 0.9 % 250 mL IVPB  Status:  Discontinued     1,250 mg 166.7 mL/hr over 90 Minutes Intravenous Every 24 hours 09/14/12 0237 09/16/12 0942   09/14/12 1000  piperacillin-tazobactam (ZOSYN) IVPB 3.375 g  Status:  Discontinued     3.375 g 12.5 mL/hr over 240 Minutes Intravenous Every 8 hours 09/14/12 0906 09/16/12 1020   09/13/12 2230  vancomycin (VANCOCIN) IVPB 1000 mg/200 mL premix     1,000 mg 200 mL/hr over 60 Minutes Intravenous  Once 09/13/12 2229 09/14/12 0056   09/13/12 2230  piperacillin-tazobactam (ZOSYN) IVPB 3.375 g     3.375 g 100 mL/hr over 30 Minutes Intravenous  Once 09/13/12 2229 09/14/12 0135   09/13/12 1930  cefTRIAXone (ROCEPHIN) 1 g in dextrose 5 % 50 mL IVPB     1 g 100 mL/hr over 30 Minutes Intravenous  Once 09/13/12 1929 09/13/12 2035       Time Spent in minutes   45   Ronika Kelson K M.D on 09/17/2012 at 11:58 AM  Between 7am to 7pm - Pager - [phone]  After 7pm go to [email or web address] - password [email or web address]  And look for the night coverage person covering for me after hours  [facility] Office  [phone]    Subjective:   [name] today has, No headache, No chest pain, No abdominal pain - No Nausea, No new weakness tingling or numbness, No Cough - SOB.   Objective:   Filed Vitals:   09/16/12 1426 09/16/12 2100 09/17/12 0633 09/17/12 1048  BP: 121/75 141/69 125/66 127/72  Pulse: 65 64 65 64  Temp: 97.9 F (36.6 C) 97.7 F (36.5 C) 98.4 F (36.9 C)   TempSrc: Oral Oral Oral   Resp: 16 18 20    Height:      Weight:      SpO2: 97%  96%     Wt Readings from Last 3 Encounters:  09/13/12 93.9 kg (207 lb  0.2 oz)  05/04/12 111 kg (244 lb 11.4 oz)  04/17/12 95.412 kg (  210 lb 5.5 oz)     Intake/Output Summary (Last 24 hours) at 09/17/12 1158 Last data filed at 09/17/12 0639  Gross per 24 hour  Intake    320 ml  Output   1590 ml  Net  -1270 ml    Exam Awake Alert, Oriented X 3, No new F.N deficits, Normal affect, chronic right-sided hemiparesis from previous CVA [place].AT,PERRAL Supple Neck,No JVD, No cervical lymphadenopathy appriciated.  Symmetrical Chest wall movement, Good air movement bilaterally, CTAB RRR,No Gallops,Rubs or new Murmurs, No Parasternal Heave +ve B.Sounds, Abd Soft, Non tender, No organomegaly appriciated, No rebound - guarding or rigidity. Colostomy bag in place, chronic Foley No Cyanosis, Clubbing or edema, No new Rash or bruise  , stage IV coccygeal decubitus ulcer   Data Review   Micro Results Recent Results (from the past 240 hour(s))  CULTURE, BLOOD (ROUTINE X 2)     Status: None   Collection Time    09/13/12  3:56 PM      Result Value Range Status   Specimen Description BLOOD RIGHT HAND   Final   Special Requests BOTTLES DRAWN AEROBIC AND ANAEROBIC 5CC   Final   Culture  Setup Time 09/13/2012 19:45   Final   Culture     Final   Value: KLEBSIELLA PNEUMONIAE     Note: Confirmed Extended Spectrum Beta-Lactamase Producer (ESBL) CRITICAL RESULT CALLED TO, READ BACK BY AND VERIFIED WITH: CYNTHIA WHITE 09/16/12 0950 BY SMITHERSJ     Note: Gram Stain Report Called to,Read Back By and Verified With: Gertie Gowda 09/14/12 @ 1:52PM BY RUSCA.   Report Status 09/16/2012 FINAL   Final   Organism ID, Bacteria KLEBSIELLA PNEUMONIAE   Final  CULTURE, BLOOD (ROUTINE X 2)     Status: None   Collection Time    09/13/12  4:41 PM      Result Value Range Status   Specimen Description BLOOD RIGHT HAND   Final   Special Requests BOTTLES DRAWN AEROBIC ONLY 3CC   Final   Culture  Setup Time 09/13/2012 19:45   Final   Culture     Final   Value:        BLOOD CULTURE RECEIVED NO  GROWTH TO DATE CULTURE WILL BE HELD FOR 5 DAYS BEFORE ISSUING A FINAL NEGATIVE REPORT   Report Status PENDING   Incomplete  URINE CULTURE     Status: None   Collection Time    09/13/12  6:02 PM      Result Value Range Status   Specimen Description URINE, CATHETERIZED   Final   Special Requests NONE   Final   Culture  Setup Time 09/13/2012 19:48   Final   Colony Count >=100,000 COLONIES/ML   Final   Culture     Final   Value: Multiple bacterial morphotypes present, none predominant. Suggest appropriate recollection if clinically indicated.   Report Status 09/14/2012 FINAL   Final    Radiology Reports Ir Gj Tube Change  09/01/2012   *RADIOLOGY REPORT*  Clinical Data: Old in the dual lumen gastrojejunostomy catheter  GASTROSTOMY CATHETER REPLACEMENT  Comparison: 02/05/2012  Findings: The previously placed gastrojejunostomy catheter and surrounding skin were prepped and draped in usual sterile fashion. Contrast could not be injected through the jejunal lumen. Fluoroscopic inspection demonstrated the tip of the catheter in the region of the ligament of Treitz.  A glide wire would not pass beyond the distal aspect of the catheter, which appeared to be in a true knot. For  this reason, the gastrojejunostomy   catheter was cut and partially withdrawn into a 12-French peel-away sheath, which was then advanced over the catheter into the proximal duodenum.  A parallel glide wire was advanced.  The peel-away sheath and old gastrojejunostomy catheter were then removed, confirming the true knot in the distal aspect of the jejunal limb.  A new 20-French dual lumen gastrojejunostomy catheter was then advanced, positioned with the distal end of the jejunal limb in the proximal jejunum, and  the retention balloon was inflated in the gastric lumen with 10 ml sterile saline. Injection of both the gastric and jejunal limb with contrast under fluoroscopy demonstrated appropriate positioning and patency. The patient  tolerated the procedure well.  No immediate complication.  Fluoroscopy time:  4 minutes 24 seconds  IMPRESSION:  Technically successful exchange of 20-French dual lumen gastrojejunostomy catheter.   Original Report Authenticated By: D. Andria Rhein, MD   Dg Chest Port 1 View  09/13/2012   *RADIOLOGY REPORT*  Clinical Data: Urinary tract infection.  History of hypertension and diabetes.  PORTABLE CHEST - 1 VIEW  Comparison: 05/06/2012.  Findings: 1536 hours.  Right subclavian pacemaker leads are unchanged within the right atrium and right ventricle.  The heart size and mediastinal contours are stable with aortic atherosclerosis.  The overall pulmonary aeration has improved. There is no edema, confluent airspace opacity or significant pleural effusion.  The patient is status post lower cervical thoracic fusion.  IMPRESSION: No acute cardiopulmonary process.   Original Report Authenticated By: Carey Bullocks, M.D.    CBC  Recent Labs Lab 09/13/12 1556 09/14/12 0540  WBC 21.7* 11.1*  HGB 10.9* 10.1*  HCT 33.3* 31.6*  PLT 351 324  MCV 77.6* 77.1*  MCH 25.4* 24.6*  MCHC 32.7 32.0  RDW 17.2* 17.3*  LYMPHSABS 1.7  --   MONOABS 2.0*  --   EOSABS 0.0  --   BASOSABS 0.1  --     Chemistries   Recent Labs Lab 09/13/12 1556 09/14/12 0540 09/16/12 1000  NA 133* 134* 141  K 5.1 4.6 3.4*  CL 99 101 109  CO2 24 23 23   GLUCOSE 193* 174* 97  BUN 43* 42* 25*  CREATININE 1.70* 1.44* 1.12  CALCIUM 9.3 9.1 8.9  AST 18 20  --   ALT 10 11  --   ALKPHOS 83 80  --   BILITOT 0.3 0.3  --    ------------------------------------------------------------------------------------------------------------------ estimated creatinine clearance is 66.3 ml/min (by C-G formula based on Cr of 1.12). ------------------------------------------------------------------------------------------------------------------ No results found for this basename: HGBA1C,  in the last 72  hours ------------------------------------------------------------------------------------------------------------------ No results found for this basename: CHOL, HDL, LDLCALC, TRIG, CHOLHDL, LDLDIRECT,  in the last 72 hours ------------------------------------------------------------------------------------------------------------------ No results found for this basename: TSH, T4TOTAL, FREET3, T3FREE, THYROIDAB,  in the last 72 hours ------------------------------------------------------------------------------------------------------------------ No results found for this basename: VITAMINB12, FOLATE, FERRITIN, TIBC, IRON, RETICCTPCT,  in the last 72 hours  Coagulation profile No results found for this basename: INR, PROTIME,  in the last 168 hours  No results found for this basename: DDIMER,  in the last 72 hours  Cardiac Enzymes No results found for this basename: CK, CKMB, TROPONINI, MYOGLOBIN,  in the last 168 hours ------------------------------------------------------------------------------------------------------------------ No components found with this basename: POCBNP,

## 2012-09-17 NOTE — Progress Notes (Signed)
Clinical Social Work  CSW spoke with MD who reports that patient is not ready to DC and may need two weeks of antibiotics at SNF. CSW called [facility] who is agreeable to return with IV medications but prefers PICC. MD aware and agreeable to PICC if needed.  CSW met with patient and wife at bedside. Patient sleeping during assessment. Wife reports she has been staying with patient and appreciative of CSW visit. CSW explained that SNF is still agreeable to accept when medically stable. CSW offered emotional support and agreeable to continue to follow patient.  [place], [place] [phone]

## 2012-09-17 NOTE — Consult Note (Signed)
WOC consult requested: Consult was already performed on 6/1.  Please refer to progress notes for wound assessment and plan of care. Please re-consult if further assistance is needed.  Thank-you,  Cammie Mcgee MSN, RN, CWOCN, [place], CNS [phone]

## 2012-09-17 NOTE — Progress Notes (Signed)
Echocardiogram 2D Echocardiogram has been performed.  Jalonda Antigua 09/17/2012, 2:00 PM

## 2012-09-18 ENCOUNTER — Inpatient Hospital Stay (HOSPITAL_COMMUNITY)
Admit: 2012-09-18 | Discharge: 2012-09-18 | Disposition: A | Discharge status: Home / Self Care | Payer: Medicare Other | Attending: Infectious Disease | Admitting: Infectious Disease

## 2012-09-18 LAB — CBC
HCT: 30 % — ABNORMAL LOW (ref 39.0–52.0)
Hemoglobin: 9.2 g/dL — ABNORMAL LOW (ref 13.0–17.0)
MCH: 23.5 pg — ABNORMAL LOW (ref 26.0–34.0)
MCHC: 30.7 g/dL (ref 30.0–36.0)
MCV: 76.7 fL — ABNORMAL LOW (ref 78.0–100.0)
Platelets: 303 10*3/uL (ref 150–400)
RBC: 3.91 MIL/uL — ABNORMAL LOW (ref 4.22–5.81)
RDW: 17.8 % — ABNORMAL HIGH (ref 11.5–15.5)
WBC: 4.1 10*3/uL (ref 4.0–10.5)

## 2012-09-18 LAB — BASIC METABOLIC PANEL
BUN: 24 mg/dL — ABNORMAL HIGH (ref 6–23)
CO2: 21 mEq/L (ref 19–32)
Calcium: 9.2 mg/dL (ref 8.4–10.5)
Chloride: 108 mEq/L (ref 96–112)
Creatinine, Ser: 0.83 mg/dL (ref 0.50–1.35)
GFR calc Af Amer: >90 mL/min (ref >90)
GFR calc non Af Amer: 84 mL/min — ABNORMAL LOW (ref >90)
Glucose, Bld: 118 mg/dL — ABNORMAL HIGH (ref 70–99)
Potassium: 3.3 mEq/L — ABNORMAL LOW (ref 3.5–5.1)
Sodium: 140 mEq/L (ref 135–145)

## 2012-09-18 LAB — GLUCOSE, CAPILLARY
Glucose-Capillary: 101 mg/dL — ABNORMAL HIGH (ref 70–99)
Glucose-Capillary: 95 mg/dL (ref 70–99)
Glucose-Capillary: 95 mg/dL (ref 70–99)
Glucose-Capillary: 126 mg/dL — ABNORMAL HIGH (ref 70–99)

## 2012-09-18 LAB — RPR: RPR Ser Ql: NONREACTIVE

## 2012-09-18 LAB — HEPATITIS C ANTIBODY (REFLEX): HCV Ab: NEGATIVE

## 2012-09-18 MED ORDER — IOHEXOL 300 MG/ML  SOLN
100.0000 mL | Freq: Once | INTRAMUSCULAR | Status: AC | PRN
  Administered 2012-09-18: 100 mL via INTRAVENOUS

## 2012-09-18 MED ORDER — MAGNESIUM SULFATE IN D5W 10-5 MG/ML-% IV SOLN
1.0000 g | Freq: Once | INTRAVENOUS | Status: AC
  Administered 2012-09-18: 1 g via INTRAVENOUS
  Filled 2012-09-18: qty 100

## 2012-09-18 MED ORDER — IOHEXOL 300 MG/ML  SOLN
25.0000 mL | INTRAMUSCULAR | Status: AC
  Administered 2012-09-18 (×2): 25 mL via ORAL

## 2012-09-18 MED ORDER — POTASSIUM CHLORIDE 20 MEQ/15ML (10%) PO LIQD
40.0000 meq | Freq: Once | ORAL | Status: AC
  Administered 2012-09-18: 40 meq via ORAL
  Filled 2012-09-18: qty 30

## 2012-09-18 NOTE — Progress Notes (Signed)
Pt disconnected feeding tube during feeding this morning.

## 2012-09-18 NOTE — Evaluation (Signed)
SLP Cancellation Note  Patient Details Name: MALLORY ENRIQUES MRN: [id number] DOB: 01-Mar-1936   Cancelled treatment:       Reason Eval/Treat Not Completed: Medical issues which prohibited therapy. Pt to have CT of abdomen today, will re-attempt at another time. SLP phoned [facility] requesting return call from SNF SLP for premorbid information.  Donavan Burnet, MS [facility] SLP [phone]

## 2012-09-18 NOTE — Progress Notes (Signed)
[facility]                                                                                Patient Demographics  [name], is a 77 y.o. male, DOB - 12-Sep-1935, ZOX:[id number], WJX:[id number]  Admit date - 09/13/2012  Admitting Physician Meredeth Ide, MD  Outpatient Primary MD for the patient is HASSELL Angelique Blonder, MD  LOS - 5   Chief Complaint  Patient presents with  . 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  patient has been residing at skilled facility for past 3 years and is totally dependent for his care. Patient has PEG tube in place, chronic Foley catheter, colostomy bag in place. Patient has a large coccygeal decubitus ulcer which had been from time. Patient had a flap done a year ago and the ulcer failed to heal at this time as per the wife patient is not a candidate for further surgery. Colostomy was done to prevent soiling of the ulcer.     Assessment and Plan   1. Sepsis with Klebsiella bacteremia likely source urine as patient has chronic indwelling Foley catheter. His stage IV coccygeal decubitus ulcer does not appear infected, he however does have prosthetic mitral valve with previous history of endocarditis - for now he will be continued on Primaxin, I have requested ID to provide input on duration of IV antibiotics in the light of bacteremia, 2-D echogram appears stable, TEE ordered, await CT abdomen pelvis results ordered by ID, repeat UA and culture along with repeat blood cultures have been ordered,  His Foley catheter was changed during this admission. Appreciate IDs help and input.    2. Acute renal insufficiency. Due to sepsis from #1, resolved after IV fluids. IV fluids have been stopped.    3. Diabetes mellitus type 2. Continue on Lantus and  sliding-scale insulin  Lab Results  Component Value Date   HGBA1C 8.7* 04/13/2012    CBG (last 3)   Recent Labs  09/17/12 2354 09/18/12 0351 09/18/12 0734  GLUCAP 100* 101* 101*     4. 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 Wound care following, coccygeal ulcer does not appear to be infected.    5. H/O CVA with right-sided hemiparesis which is chronic, chronic bedbound status, chronic Foley catheter, PEG . - Continue supportive care, feeding, meds and free water through PEG tube, outpatient followup with PCP post discharge. Per wife he does eat by mouth also will have PT evaluate and address diet.   6. Hypothyroidism continue home dose Synthroid.   7. Malnutrition of moderate degree - continue nutritional supplementation. We'll also request speech to evaluate the patient as per wife he does eat from mouth also along with his PEG tube feeds.     Code Status: full  Family Communication: wife  Disposition Plan: SNF   Procedures TTE, TEE, CT Abd and Pelvis   Consults  ID, W care   DVT Prophylaxis  Lovenox  Lab Results  Component Value Date   PLT 303 09/18/2012    Medications  Scheduled Meds: . citalopram  10 mg Oral q morning - 10a  . enoxaparin (LOVENOX) injection  40 mg Subcutaneous QHS  . ertapenem  1 g Intravenous Q24H  . feeding supplement (GLUCERNA 1.2 CAL)  1,000 mL Per Tube Q24H  . feeding supplement (GLUCERNA 1.2 CAL)  237 mL Per Tube TID PC  . feeding supplement  30 mL Per Tube TID  . ferrous sulfate  300 mg Per Tube BID  . free water  60 mL Per Tube Q6H  . gabapentin  300 mg Per Tube QHS  . glycopyrrolate  1 mg Oral TID  . insulin aspart  0-9 Units Subcutaneous Q4H  . insulin glargine  20 Units Subcutaneous QHS  . iohexol  25 mL Oral Q1 Hr x 2  . levothyroxine  125 mcg Oral QAC breakfast  . liothyronine  25 mcg Oral q morning - 10a  . magnesium sulfate 1 - 4 g bolus IVPB  1 g Intravenous Once  . metoprolol tartrate   25 mg Per Tube BID  . multivitamin  5 mL Per Tube Daily  . potassium chloride  40 mEq Oral Once  . scopolamine  1 patch Transdermal Q72H  . traZODone  25 mg Oral QHS  . vitamin C  500 mg Per Tube BID   Continuous Infusions: . sodium chloride 20 mL (09/16/12 0750)   PRN Meds:.acetaminophen (TYLENOL) oral liquid 160 mg/5 mL, diphenhydrAMINE, traMADol  Antibiotics     Anti-infectives   Start     Dose/Rate Route Frequency Ordered Stop   09/17/12 2300  ertapenem (INVANZ) 1 g in sodium chloride 0.9 % 50 mL IVPB     1 g 100 mL/hr over 30 Minutes Intravenous Every 24 hours 09/17/12 1546     09/16/12 1400  imipenem-cilastatin (PRIMAXIN) 500 mg in sodium chloride 0.9 % 100 mL IVPB  Status:  Discontinued     500 mg 200 mL/hr over 30 Minutes Intravenous 3 times per day 09/16/12 1020 09/17/12 1546   09/14/12 2000  vancomycin (VANCOCIN) 1,250 mg in sodium chloride 0.9 % 250 mL IVPB  Status:  Discontinued     1,250 mg 166.7 mL/hr over 90 Minutes Intravenous Every 24 hours 09/14/12 0237 09/16/12 0942   09/14/12 1000  piperacillin-tazobactam (ZOSYN) IVPB 3.375 g  Status:  Discontinued     3.375 g 12.5 mL/hr over 240 Minutes Intravenous Every 8 hours 09/14/12 0906 09/16/12 1020   09/13/12 2230  vancomycin (VANCOCIN) IVPB 1000 mg/200 mL premix     1,000 mg 200 mL/hr over 60 Minutes Intravenous  Once 09/13/12 2229 09/14/12 0056   09/13/12 2230  piperacillin-tazobactam (ZOSYN) IVPB 3.375 g     3.375 g 100 mL/hr over 30 Minutes Intravenous  Once 09/13/12 2229 09/14/12 0135   09/13/12 1930  cefTRIAXone (ROCEPHIN) 1 g in dextrose 5 % 50 mL IVPB     1 g 100 mL/hr over 30 Minutes Intravenous  Once 09/13/12 1929 09/13/12 2035       Time Spent in minutes   45   SINGH,PRASHANT K M.D on 09/18/2012 at 8:55 AM  Between 7am to 7pm - Pager - [phone]  After 7pm go to [email or web address] - password [email or web address]  And look for the night coverage person covering for me after hours  [facility] Office   [phone]    Subjective:   Levern Pitter today has, No headache, No chest pain,  No abdominal pain - No Nausea, No new weakness tingling or numbness, No Cough - SOB.   Objective:   Filed Vitals:   09/17/12 0633 09/17/12 1048 09/17/12 2235 09/18/12 0634  BP: 125/66 127/72 117/84 108/68  Pulse: 65 64 68 66  Temp: 98.4 F (36.9 C)  97.8 F (36.6 C) 98.2 F (36.8 C)  TempSrc: Oral  Oral Oral  Resp: 20     Height:      Weight:      SpO2: 96%  98% 99%    Wt Readings from Last 3 Encounters:  09/13/12 93.9 kg (207 lb 0.2 oz)  05/04/12 111 kg (244 lb 11.4 oz)  04/17/12 95.412 kg (210 lb 5.5 oz)     Intake/Output Summary (Last 24 hours) at 09/18/12 0855 Last data filed at 09/18/12 0600  Gross per 24 hour  Intake   1150 ml  Output   3770 ml  Net  -2620 ml    Exam Awake Alert, Oriented X 3, No new F.N deficits, Normal affect, chronic right-sided hemiparesis from previous CVA [place].AT,PERRAL Supple Neck,No JVD, No cervical lymphadenopathy appriciated.  Symmetrical Chest wall movement, Good air movement bilaterally, CTAB RRR,No Gallops,Rubs or new Murmurs, No Parasternal Heave +ve B.Sounds, Abd Soft, Non tender, No organomegaly appriciated, No rebound - guarding or rigidity. Colostomy bag in place, chronic Foley No Cyanosis, Clubbing or edema, No new Rash or bruise  , stage IV coccygeal decubitus ulcer   Data Review   Micro Results Recent Results (from the past 240 hour(s))  CULTURE, BLOOD (ROUTINE X 2)     Status: None   Collection Time    09/13/12  3:56 PM      Result Value Range Status   Specimen Description BLOOD RIGHT HAND   Final   Special Requests BOTTLES DRAWN AEROBIC AND ANAEROBIC 5CC   Final   Culture  Setup Time 09/13/2012 19:45   Final   Culture     Final   Value: KLEBSIELLA PNEUMONIAE     Note: Confirmed Extended Spectrum Beta-Lactamase Producer (ESBL) CRITICAL RESULT CALLED TO, READ BACK BY AND VERIFIED WITH: CYNTHIA WHITE 09/16/12 0950 BY SMITHERSJ      Note: Gram Stain Report Called to,Read Back By and Verified With: Gertie Gowda 09/14/12 @ 1:52PM BY RUSCA.   Report Status 09/16/2012 FINAL   Final   Organism ID, Bacteria KLEBSIELLA PNEUMONIAE   Final  CULTURE, BLOOD (ROUTINE X 2)     Status: None   Collection Time    09/13/12  4:41 PM      Result Value Range Status   Specimen Description BLOOD RIGHT HAND   Final   Special Requests BOTTLES DRAWN AEROBIC ONLY 3CC   Final   Culture  Setup Time 09/13/2012 19:45   Final   Culture     Final   Value:        BLOOD CULTURE RECEIVED NO GROWTH TO DATE CULTURE WILL BE HELD FOR 5 DAYS BEFORE ISSUING A FINAL NEGATIVE REPORT   Report Status PENDING   Incomplete  URINE CULTURE     Status: None   Collection Time    09/13/12  6:02 PM      Result Value Range Status   Specimen Description URINE, CATHETERIZED   Final   Special Requests NONE   Final   Culture  Setup Time 09/13/2012 19:48   Final   Colony Count >=100,000 COLONIES/ML   Final   Culture     Final   Value: Multiple  bacterial morphotypes present, none predominant. Suggest appropriate recollection if clinically indicated.   Report Status 09/14/2012 FINAL   Final    Radiology Reports Ir Gj Tube Change  09/01/2012   *RADIOLOGY REPORT*  Clinical Data: Old in the dual lumen gastrojejunostomy catheter  GASTROSTOMY CATHETER REPLACEMENT  Comparison: 02/05/2012  Findings: The previously placed gastrojejunostomy catheter and surrounding skin were prepped and draped in usual sterile fashion. Contrast could not be injected through the jejunal lumen. Fluoroscopic inspection demonstrated the tip of the catheter in the region of the ligament of Treitz.  A glide wire would not pass beyond the distal aspect of the catheter, which appeared to be in a true knot. For this reason, the gastrojejunostomy   catheter was cut and partially withdrawn into a 12-French peel-away sheath, which was then advanced over the catheter into the proximal duodenum.  A parallel glide  wire was advanced.  The peel-away sheath and old gastrojejunostomy catheter were then removed, confirming the true knot in the distal aspect of the jejunal limb.  A new 20-French dual lumen gastrojejunostomy catheter was then advanced, positioned with the distal end of the jejunal limb in the proximal jejunum, and  the retention balloon was inflated in the gastric lumen with 10 ml sterile saline. Injection of both the gastric and jejunal limb with contrast under fluoroscopy demonstrated appropriate positioning and patency. The patient tolerated the procedure well.  No immediate complication.  Fluoroscopy time:  4 minutes 24 seconds  IMPRESSION:  Technically successful exchange of 20-French dual lumen gastrojejunostomy catheter.   Original Report Authenticated By: D. Andria Rhein, MD   Dg Chest Port 1 View  09/13/2012   *RADIOLOGY REPORT*  Clinical Data: Urinary tract infection.  History of hypertension and diabetes.  PORTABLE CHEST - 1 VIEW  Comparison: 05/06/2012.  Findings: 1536 hours.  Right subclavian pacemaker leads are unchanged within the right atrium and right ventricle.  The heart size and mediastinal contours are stable with aortic atherosclerosis.  The overall pulmonary aeration has improved. There is no edema, confluent airspace opacity or significant pleural effusion.  The patient is status post lower cervical thoracic fusion.  IMPRESSION: No acute cardiopulmonary process.   Original Report Authenticated By: Carey Bullocks, M.D.    CBC  Recent Labs Lab 09/13/12 1556 09/14/12 0540 09/18/12 0455  WBC 21.7* 11.1* 4.1  HGB 10.9* 10.1* 9.2*  HCT 33.3* 31.6* 30.0*  PLT 351 324 303  MCV 77.6* 77.1* 76.7*  MCH 25.4* 24.6* 23.5*  MCHC 32.7 32.0 30.7  RDW 17.2* 17.3* 17.8*  LYMPHSABS 1.7  --   --   MONOABS 2.0*  --   --   EOSABS 0.0  --   --   BASOSABS 0.1  --   --     Chemistries   Recent Labs Lab 09/13/12 1556 09/14/12 0540 09/16/12 1000 09/18/12 0455  NA 133* 134* 141 140  K  5.1 4.6 3.4* 3.3*  CL 99 101 109 108  CO2 24 23 23 21   GLUCOSE 193* 174* 97 118*  BUN 43* 42* 25* 24*  CREATININE 1.70* 1.44* 1.12 0.83  CALCIUM 9.3 9.1 8.9 9.2  AST 18 20  --   --   ALT 10 11  --   --   ALKPHOS 83 80  --   --   BILITOT 0.3 0.3  --   --    ------------------------------------------------------------------------------------------------------------------ estimated creatinine clearance is 89.4 ml/min (by C-G formula based on Cr of 0.83). ------------------------------------------------------------------------------------------------------------------ No results found for this  basename: HGBA1C,  in the last 72 hours ------------------------------------------------------------------------------------------------------------------ No results found for this basename: CHOL, HDL, LDLCALC, TRIG, CHOLHDL, LDLDIRECT,  in the last 72 hours ------------------------------------------------------------------------------------------------------------------ No results found for this basename: TSH, T4TOTAL, FREET3, T3FREE, THYROIDAB,  in the last 72 hours ------------------------------------------------------------------------------------------------------------------ No results found for this basename: VITAMINB12, FOLATE, FERRITIN, TIBC, IRON, RETICCTPCT,  in the last 72 hours  Coagulation profile No results found for this basename: INR, PROTIME,  in the last 168 hours  No results found for this basename: DDIMER,  in the last 72 hours  Cardiac Enzymes No results found for this basename: CK, CKMB, TROPONINI, MYOGLOBIN,  in the last 168 hours ------------------------------------------------------------------------------------------------------------------ No components found with this basename: POCBNP,

## 2012-09-18 NOTE — Progress Notes (Signed)
[facility]    Subjective:  Pt sleeping   Antibiotics:  Anti-infectives   Start     Dose/Rate Route Frequency Ordered Stop   09/17/12 2300  ertapenem (INVANZ) 1 g in sodium chloride 0.9 % 50 mL IVPB     1 g 100 mL/hr over 30 Minutes Intravenous Every 24 hours 09/17/12 1546     09/16/12 1400  imipenem-cilastatin (PRIMAXIN) 500 mg in sodium chloride 0.9 % 100 mL IVPB  Status:  Discontinued     500 mg 200 mL/hr over 30 Minutes Intravenous 3 times per day 09/16/12 1020 09/17/12 1546   09/14/12 2000  vancomycin (VANCOCIN) 1,250 mg in sodium chloride 0.9 % 250 mL IVPB  Status:  Discontinued     1,250 mg 166.7 mL/hr over 90 Minutes Intravenous Every 24 hours 09/14/12 0237 09/16/12 0942   09/14/12 1000  piperacillin-tazobactam (ZOSYN) IVPB 3.375 g  Status:  Discontinued     3.375 g 12.5 mL/hr over 240 Minutes Intravenous Every 8 hours 09/14/12 0906 09/16/12 1020   09/13/12 2230  vancomycin (VANCOCIN) IVPB 1000 mg/200 mL premix     1,000 mg 200 mL/hr over 60 Minutes Intravenous  Once 09/13/12 2229 09/14/12 0056   09/13/12 2230  piperacillin-tazobactam (ZOSYN) IVPB 3.375 g     3.375 g 100 mL/hr over 30 Minutes Intravenous  Once 09/13/12 2229 09/14/12 0135   09/13/12 1930  cefTRIAXone (ROCEPHIN) 1 g in dextrose 5 % 50 mL IVPB     1 g 100 mL/hr over 30 Minutes Intravenous  Once 09/13/12 1929 09/13/12 2035      Medications: Scheduled Meds: . citalopram  10 mg Oral q morning - 10a  . enoxaparin (LOVENOX) injection  40 mg Subcutaneous QHS  . ertapenem  1 g Intravenous Q24H  . feeding supplement (GLUCERNA 1.2 CAL)  1,000 mL Per Tube Q24H  . feeding supplement (GLUCERNA 1.2 CAL)  237 mL Per Tube TID PC  . feeding supplement  30 mL Per Tube TID  . ferrous sulfate  300 mg Per Tube BID  . free water  60 mL Per Tube Q6H  . gabapentin  300 mg Per Tube QHS  . glycopyrrolate  1 mg Oral TID  . insulin aspart  0-9 Units Subcutaneous Q4H  . insulin glargine  20  Units Subcutaneous QHS  . levothyroxine  125 mcg Oral QAC breakfast  . liothyronine  25 mcg Oral q morning - 10a  . magnesium sulfate 1 - 4 g bolus IVPB  1 g Intravenous Once  . metoprolol tartrate  25 mg Per Tube BID  . multivitamin  5 mL Per Tube Daily  . potassium chloride  40 mEq Oral Once  . scopolamine  1 patch Transdermal Q72H  . traZODone  25 mg Oral QHS  . vitamin C  500 mg Per Tube BID   Continuous Infusions: . sodium chloride 20 mL (09/16/12 0750)   PRN Meds:.acetaminophen (TYLENOL) oral liquid 160 mg/5 mL, diphenhydrAMINE, traMADol   Objective: Weight change:   Intake/Output Summary (Last 24 hours) at 09/18/12 1041 Last data filed at 09/18/12 0600  Gross per 24 hour  Intake   1150 ml  Output   3770 ml  Net  -2620 ml   Blood pressure 108/68, pulse 66, temperature 98.2 F (36.8 C), temperature source Oral, resp. rate 20, height 6\' 2"  (1.88 m), weight 207 lb 0.2 oz (93.9 kg), SpO2 99.00%. Temp:  [97.8 F (36.6 C)-98.2 F (36.8 C)] 98.2 F (  36.8 C) (06/05 0634) Pulse Rate:  [64-68] 66 (06/05 0634) BP: (108-127)/(68-84) 108/68 mmHg (06/05 0634) SpO2:  [98 %-99 %] 99 % (06/05 4098)  Physical Exam: General: sleeping soundly Discussed case with wife at bedside.  Lab Results:  Recent Labs  09/18/12 0455  WBC 4.1  HGB 9.2*  HCT 30.0*  PLT 303    BMET  Recent Labs  09/16/12 1000 09/18/12 0455  NA 141 140  K 3.4* 3.3*  CL 109 108  CO2 23 21  GLUCOSE 97 118*  BUN 25* 24*  CREATININE 1.12 0.83  CALCIUM 8.9 9.2    Micro Results: Recent Results (from the past 240 hour(s))  CULTURE, BLOOD (ROUTINE X 2)     Status: None   Collection Time    09/13/12  3:56 PM      Result Value Range Status   Specimen Description BLOOD RIGHT HAND   Final   Special Requests BOTTLES DRAWN AEROBIC AND ANAEROBIC 5CC   Final   Culture  Setup Time 09/13/2012 19:45   Final   Culture     Final   Value: KLEBSIELLA PNEUMONIAE     Note: Confirmed Extended Spectrum  Beta-Lactamase Producer (ESBL) CRITICAL RESULT CALLED TO, READ BACK BY AND VERIFIED WITH: CYNTHIA WHITE 09/16/12 0950 BY SMITHERSJ     Note: Gram Stain Report Called to,Read Back By and Verified With: Gertie Gowda 09/14/12 @ 1:52PM BY RUSCA.   Report Status 09/16/2012 FINAL   Final   Organism ID, Bacteria KLEBSIELLA PNEUMONIAE   Final  CULTURE, BLOOD (ROUTINE X 2)     Status: None   Collection Time    09/13/12  4:41 PM      Result Value Range Status   Specimen Description BLOOD RIGHT HAND   Final   Special Requests BOTTLES DRAWN AEROBIC ONLY 3CC   Final   Culture  Setup Time 09/13/2012 19:45   Final   Culture     Final   Value:        BLOOD CULTURE RECEIVED NO GROWTH TO DATE CULTURE WILL BE HELD FOR 5 DAYS BEFORE ISSUING A FINAL NEGATIVE REPORT   Report Status PENDING   Incomplete  URINE CULTURE     Status: None   Collection Time    09/13/12  6:02 PM      Result Value Range Status   Specimen Description URINE, CATHETERIZED   Final   Special Requests NONE   Final   Culture  Setup Time 09/13/2012 19:48   Final   Colony Count >=100,000 COLONIES/ML   Final   Culture     Final   Value: Multiple bacterial morphotypes present, none predominant. Suggest appropriate recollection if clinically indicated.   Report Status 09/14/2012 FINAL   Final    Studies/Results: No results found.    Assessment/Plan: [name] is a 77 y.o. male with history of prior mitral valve endocarditis pacemaker placement for bradycardia a stroke with , large stage IV decubitus ulcer that failed graft placement now admitted with extended spectrum beta-lactamase producing Klebsiella pneumonia bacteremia without clear cut cause. He also has a notable history for Clostridium difficile colitis.    #1 Extended Spectrum Beta-Lactamase producing Klebsiella pneumonia bacteremia: Urine could have been the source although it is dissatisfying that multiple organism isolate on urine culture. Additional portal of entry would  include his very large sacral decubitus ulcer under which he may likely have chronic osteomyelitis. Additionally he could have an intra-abdominal source of infection. Finally given the  fact that he has a pacemaker there is additional concern for the risk of pacemaker infection and even heart valve infection although it is not typical to see that happen with this particular organism treating it could such an endocvascular infection could be highly problematic given the resistant nature of the organism. 2 d echo without vegetations on MV AV but is 2d and TV, PV not visualized well.ESR is VERY HIGH  --repeat blood cultures are sent --simplify to ertapenem   -- CT of the abdomen and pelvis with oral and if possible IV contrast to evaluate for intra-abdominal sources of infection  Is pending --Will check a sedimentation rate and C-reactive protein.  --he is to get  transesophageal echocardiogram  --I would also recommend getting in touch with Sharrell Ku, certainly this will be a MUST if he has anything on PM leads    #2 large sacral decubitus ulcer: Could be source of infection again would like imaging   #3 screening all screening for HIV and hepatitis C if this has not been done.       LOS: 5 days   Acey Lav 09/18/2012, 10:41 AM

## 2012-09-18 NOTE — Clinical Documentation Improvement (Signed)
GENERIC DOCUMENTATION CLARIFICATION QUERY  THIS DOCUMENT IS NOT A PERMANENT PART OF THE MEDICAL RECORD  TO RESPOND TO THE THIS QUERY, FOLLOW THE INSTRUCTIONS BELOW:  1. If needed, update documentation for the patient's encounter via the notes activity.  2. Access this query again and click edit on the In [employer].  3. After updating, or not, click F2 to complete all highlighted (required) fields concerning your review. Select "additional documentation in the medical record" OR "no additional documentation provided".  4. Click Sign note button.  5. The deficiency will fall out of your In Basket *Please let us know if you are not able to complete this workflow by phone or e-mail (listed below).  Please update your documentation within the medical record to reflect your response to this query.                                                                                        09/18/12   Dear Dr. Thedore Mins / Associates,  In a better effort to capture your patient's severity of illness, reflect appropriate length of stay and utilization of resources, a review of the patient medical record has revealed the following indicators.    Based on your clinical judgment, please clarify and document in a progress note and/or discharge summary the clinical condition associated with the following supporting information:  In responding to this query please exercise your independent judgment.  The fact that a query is asked, does not imply that any particular answer is desired or expected.  According to documentation in progress notes, pt has been bed bound for ~3 years in SNF.Please clarify if pt bed- bound status can be further specified. Thank you.  _______Functional Quadraplegia _______Other Condition__________________ _______Cannot Clinically Determine   Supporting Information:  Risk Factors:Stroke with right hemiparesis, bed-bound,  Pressure ulcer stage IV, chronic indwelling foley cath, PEG  tube nutrition, Colostomy    Signs & Symptoms: 09/15/12 progress notes>totally dependent for his care    Treatment: WOC ulcer care, Antibiotics,  Continue PEG feedings,   You may use possible, probable, or suspect with inpatient documentation. possible, probable, suspected diagnoses MUST be documented at the time of discharge  Reviewed:  no additional documentation provided  Thank You,  Andy Gauss RN, BSN  Clinical Documentation Specialist:  Pager [phone] Office [phone] [facility] Information Management [place]

## 2012-09-18 NOTE — Progress Notes (Signed)
Colostomy bag changed

## 2012-09-19 ENCOUNTER — Encounter (HOSPITAL_COMMUNITY)
Admission: EM | Disposition: A | Discharge status: Skilled Nursing Facility | Payer: Self-pay | Source: Home / Self Care | Attending: Internal Medicine

## 2012-09-19 ENCOUNTER — Encounter (HOSPITAL_COMMUNITY): Payer: Self-pay | Admitting: *Deleted

## 2012-09-19 DIAGNOSIS — M869 Osteomyelitis, unspecified: Secondary | ICD-10-CM

## 2012-09-19 DIAGNOSIS — M8970 Major osseous defect, unspecified site: Secondary | ICD-10-CM

## 2012-09-19 LAB — GLUCOSE, CAPILLARY
Glucose-Capillary: 101 mg/dL — ABNORMAL HIGH (ref 70–99)
Glucose-Capillary: 110 mg/dL — ABNORMAL HIGH (ref 70–99)
Glucose-Capillary: 127 mg/dL — ABNORMAL HIGH (ref 70–99)
Glucose-Capillary: 98 mg/dL (ref 70–99)

## 2012-09-19 LAB — CULTURE, BLOOD (ROUTINE X 2): Culture: NO GROWTH

## 2012-09-19 SURGERY — CANCELLED PROCEDURE

## 2012-09-19 MED ORDER — SODIUM CHLORIDE 0.9 % IV SOLN: INTRAVENOUS | Status: DC

## 2012-09-19 MED ORDER — POTASSIUM CHLORIDE 20 MEQ/15ML (10%) PO LIQD
40.0000 meq | Freq: Once | ORAL | Status: AC
  Administered 2012-09-19: 40 meq via ORAL
  Filled 2012-09-19: qty 30

## 2012-09-19 MED ORDER — POTASSIUM CHLORIDE 20 MEQ/15ML (10%) PO LIQD: 40.0000 meq | Freq: Three times a day (TID) | ORAL | Status: DC

## 2012-09-19 NOTE — Progress Notes (Signed)
Patient ID: JORDIE SCHREUR, male   DOB: June 29, 1935, 77 y.o.   MRN: [id number]   Request for L iliac bone bx/asp has been rec'd in IR...  Dr Malachy Moan has reviewed films and has spoken to Dr Thedore Mins.  Will set bx up for 6/9. Pt now in Endo at [facility] - sedated; to be transferred back to [facility] asap.  CVA; endocarditis; bacteremia Sacral decubitus ulcer; flap surgery and has failed Osteomyelitis of L sacroiliac jt to muscle and into obturator--abscess.

## 2012-09-19 NOTE — OR Nursing (Signed)
[facility] arrived for transport, given report on patient.  Notified floor and Darreld Mclean, Rn that pt is on the way.  Ennis Forts, RN

## 2012-09-19 NOTE — Progress Notes (Signed)
We were ask to see by Dr. Thedore Mins, but he is at [facility] for a procedure.  I have him on the list and we will see over the weekend.

## 2012-09-19 NOTE — Progress Notes (Signed)
Pt back from [facility].  No changes noted.

## 2012-09-19 NOTE — OR Nursing (Signed)
Verified with Christain Sacramento that consent for TEE should be for "bacterial endocarditis".  Verified with Terese Door, RN. Ennis Forts, RN

## 2012-09-19 NOTE — Progress Notes (Signed)
Clinical Social Work  Per MD, patient not ready to DC and will most likely be ready to DC on Monday. CSW called SNF who is agreeable to accept patient when he is medically stable but cannot accept him over the weekend. CSW will continue to follow.  [place], [place] [phone]

## 2012-09-19 NOTE — Progress Notes (Signed)
Spoke with pt and his wife.  Pt wife states that he was on a regular diet and he would eat anything.  She states that he eats hamburgers and what ever he wants.  Dr Thedore Mins sent a text per pt request and asked if he could be on a  Po diet now.  New orders noted.

## 2012-09-19 NOTE — Progress Notes (Signed)
NUTRITION FOLLOW UP  Intervention:   - Continue TF regimen - Will continue to monitor   Nutrition Dx:   Inadequate oral intake related to inability to eat as evidenced by NPO - ongoing   Goal:   1. TF tolerance, resume prior TF schedule - met and slightly increased as pt remains NPO 2. Advance diet as tolerated to regular/thin liquid diet - not met   Monitor:   Weights, labs, TF tolerance, diet advancement  Assessment:   Pt asleep. RN reports pt tolerating TF well.   TF: Glucerna 1.2 at 24ml/hr x 10 hours at night plus Glucerna 1.2 1 can TID with Prostat 30ml TID - provides 2175 calories, 139g protein meeting 92% estimated calorie needs and 107% estimated protein needs    Height: Ht Readings from Last 1 Encounters:  09/13/12 6\' 2"  (1.88 m)    Weight Status:   Wt Readings from Last 1 Encounters:  09/13/12 207 lb 0.2 oz (93.9 kg)    Re-estimated needs:  Kcal: 2350-2650 Protein: 130-160g Fluid: 2.3-2.6L/day  Skin: Stage IV sacral pressure ulcer, +1 RLE, LLE edema, +2 sacral edema  Diet Order: NPO   Intake/Output Summary (Last 24 hours) at 09/19/12 1245 Last data filed at 09/19/12 1478  Gross per 24 hour  Intake    865 ml  Output    950 ml  Net    -85 ml    Last BM: 6/6   Labs:   Recent Labs Lab 09/14/12 0540 09/16/12 1000 09/18/12 0455  NA 134* 141 140  K 4.6 3.4* 3.3*  CL 101 109 108  CO2 23 23 21   BUN 42* 25* 24*  CREATININE 1.44* 1.12 0.83  CALCIUM 9.1 8.9 9.2  GLUCOSE 174* 97 118*    CBG (last 3)   Recent Labs  09/19/12 0439 09/19/12 0728 09/19/12 1156  GLUCAP 111* 98 131*    Scheduled Meds: . citalopram  10 mg Oral q morning - 10a  . enoxaparin (LOVENOX) injection  40 mg Subcutaneous QHS  . ertapenem  1 g Intravenous Q24H  . feeding supplement (GLUCERNA 1.2 CAL)  1,000 mL Per Tube Q24H  . feeding supplement (GLUCERNA 1.2 CAL)  237 mL Per Tube TID PC  . feeding supplement  30 mL Per Tube TID  . ferrous sulfate  300 mg Per Tube  BID  . free water  60 mL Per Tube Q6H  . gabapentin  300 mg Per Tube QHS  . glycopyrrolate  1 mg Oral TID  . insulin aspart  0-9 Units Subcutaneous Q4H  . insulin glargine  20 Units Subcutaneous QHS  . levothyroxine  125 mcg Oral QAC breakfast  . liothyronine  25 mcg Oral q morning - 10a  . metoprolol tartrate  25 mg Per Tube BID  . multivitamin  5 mL Per Tube Daily  . scopolamine  1 patch Transdermal Q72H  . traZODone  25 mg Oral QHS  . vitamin C  500 mg Per Tube BID    Continuous Infusions: . sodium chloride 20 mL (09/16/12 0750)     Levon Hedger MS, RD, LDN [phone] Pager [phone] After Hours Pager

## 2012-09-19 NOTE — Progress Notes (Addendum)
[facility]                                                                                Patient Demographics  [name], is a 77 y.o. male, DOB - 1936/03/02, RUE:[id number], JYN:[id number]  Admit date - 09/13/2012  Admitting Physician Meredeth Ide, MD  Outpatient Primary MD for the patient is HASSELL Angelique Blonder, MD  LOS - 6   Chief Complaint  Patient presents with  . 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 Patient has PEG tube in place, chronic Foley catheter, colostomy bag in place. Patient has a large coccygeal decubitus ulcer which had been from time. Patient had a flap done a year ago and the ulcer failed to heal at this time as per the wife patient is not a candidate for further surgery. Colostomy was done to prevent soiling of the ulcer.  His workup so far is suggestive of Klebsiella bacteremia likely source is urine versus sacral ostium mellitus, ID and general surgery have been consulted. Continue antibiotics for now.     Assessment and Plan   1. Sepsis with Klebsiella bacteremia likely source urine versus chronic sacral ostium mellitus he also has a chronic indwelling Foley catheter. His stage IV coccygeal decubitus ulcer on CT has evidence of chronic sacral ostium mellitus with questionable small abscesses, he however also have  prosthetic mitral valve with previous history of endocarditis - for now he will be continued on Primaxin, I have requested ID to provide input on duration of IV antibiotics in the light of bacteremia, 2-D echogram appears stable, TEE ordered, noted CT abdomen pelvis results ordered by ID, repeat UA and culture along with repeat blood  cultures have been ordered,  His Foley catheter was changed during this admission. Appreciate IDs help and input.    2. Acute renal insufficiency. Due to sepsis from #1, resolved after IV fluids. IV fluids have been stopped.    3. Diabetes mellitus type 2. Continue on Lantus and sliding-scale insulin  Lab Results  Component Value Date   HGBA1C 8.7* 04/13/2012    CBG (last 3)   Recent Labs  09/19/12 0013 09/19/12 0439 09/19/12 0728  GLUCAP 110* 111* 98     4. 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 does patient need debridement at this point. I have consulted Gen. surgery also.   5. H/O CVA with right-sided hemiparesis which is chronic, chronic bedbound status, chronic Foley catheter, PEG . - Continue supportive care, feeding, meds and free water through PEG tube, outpatient followup with PCP post discharge. Per wife he does eat by mouth also will have PT evaluate and address diet.   6. Hypothyroidism continue home dose Synthroid.  7. Malnutrition of moderate degree - continue nutritional supplementation. We'll also request speech to evaluate the patient as per wife he does eat from mouth also along with his PEG tube feeds.     Code Status: full  Family Communication: wife  Disposition Plan: SNF   Procedures TTE, TEE, CT Abd and Pelvis   Consults  ID, W care, CCS   DVT Prophylaxis  Lovenox    Lab Results  Component Value Date   PLT 303 09/18/2012    Medications  Scheduled Meds: . citalopram  10 mg Oral q morning - 10a  . enoxaparin (LOVENOX) injection  40 mg Subcutaneous QHS  . ertapenem  1 g Intravenous Q24H  . feeding supplement (GLUCERNA 1.2 CAL)  1,000 mL Per Tube Q24H  . feeding supplement (GLUCERNA 1.2 CAL)  237 mL Per Tube TID PC  . feeding  supplement  30 mL Per Tube TID  . ferrous sulfate  300 mg Per Tube BID  . free water  60 mL Per Tube Q6H  . gabapentin  300 mg Per Tube QHS  . glycopyrrolate  1 mg Oral TID  . insulin aspart  0-9 Units Subcutaneous Q4H  . insulin glargine  20 Units Subcutaneous QHS  . levothyroxine  125 mcg Oral QAC breakfast  . liothyronine  25 mcg Oral q morning - 10a  . metoprolol tartrate  25 mg Per Tube BID  . multivitamin  5 mL Per Tube Daily  . potassium chloride  40 mEq Oral Once  . scopolamine  1 patch Transdermal Q72H  . traZODone  25 mg Oral QHS  . vitamin C  500 mg Per Tube BID   Continuous Infusions: . sodium chloride 20 mL (09/16/12 0750)   PRN Meds:.acetaminophen (TYLENOL) oral liquid 160 mg/5 mL, diphenhydrAMINE, traMADol  Antibiotics     Anti-infectives   Start     Dose/Rate Route Frequency Ordered Stop   09/17/12 2300  ertapenem (INVANZ) 1 g in sodium chloride 0.9 % 50 mL IVPB     1 g 100 mL/hr over 30 Minutes Intravenous Every 24 hours 09/17/12 1546     09/16/12 1400  imipenem-cilastatin (PRIMAXIN) 500 mg in sodium chloride 0.9 % 100 mL IVPB  Status:  Discontinued     500 mg 200 mL/hr over 30 Minutes Intravenous 3 times per day 09/16/12 1020 09/17/12 1546   09/14/12 2000  vancomycin (VANCOCIN) 1,250 mg in sodium chloride 0.9 % 250 mL IVPB  Status:  Discontinued     1,250 mg 166.7 mL/hr over 90 Minutes Intravenous Every 24 hours 09/14/12 0237 09/16/12 0942   09/14/12 1000  piperacillin-tazobactam (ZOSYN) IVPB 3.375 g  Status:  Discontinued     3.375 g 12.5 mL/hr over 240 Minutes Intravenous Every 8 hours 09/14/12 0906 09/16/12 1020   09/13/12 2230  vancomycin (VANCOCIN) IVPB 1000 mg/200 mL premix     1,000 mg 200 mL/hr over 60 Minutes Intravenous  Once 09/13/12 2229 09/14/12 0056   09/13/12 2230  piperacillin-tazobactam (ZOSYN) IVPB 3.375 g     3.375 g 100 mL/hr over 30 Minutes Intravenous  Once 09/13/12 2229 09/14/12 0135   09/13/12 1930  cefTRIAXone (ROCEPHIN) 1 g in  dextrose 5 % 50 mL IVPB     1 g 100 mL/hr over 30 Minutes Intravenous  Once 09/13/12 1929 09/13/12 2035       Time Spent in minutes   45   SINGH,PRASHANT K M.D on 09/19/2012 at 11:30 AM  Between 7am to 7pm -  Pager - [phone]  After 7pm go to [email or web address] - password [email or web address]  And look for the night coverage person covering for me after hours  [facility] Office  [phone]    Subjective:   Taurean Ju today has, No headache, No chest pain, No abdominal pain - No Nausea, No new weakness tingling or numbness, No Cough - SOB.   Objective:   Filed Vitals:   09/17/12 2235 09/18/12 0634 09/18/12 1455 09/19/12 0641  BP: 117/84 108/68 116/71 154/85  Pulse: 68 66 60 78  Temp: 97.8 F (36.6 C) 98.2 F (36.8 C) 98 F (36.7 C) 98 F (36.7 C)  TempSrc: Oral Oral Oral Oral  Resp:   16 18  Height:      Weight:      SpO2: 98% 99% 98% 97%    Wt Readings from Last 3 Encounters:  09/13/12 93.9 kg (207 lb 0.2 oz)  09/13/12 93.9 kg (207 lb 0.2 oz)  05/04/12 111 kg (244 lb 11.4 oz)     Intake/Output Summary (Last 24 hours) at 09/19/12 1130 Last data filed at 09/19/12 2956  Gross per 24 hour  Intake    865 ml  Output    950 ml  Net    -85 ml    Exam Awake Alert, Oriented X 3, No new F.N deficits, Normal affect, chronic right-sided hemiparesis from previous CVA Stark.AT,PERRAL Supple Neck,No JVD, No cervical lymphadenopathy appriciated.  Symmetrical Chest wall movement, Good air movement bilaterally, CTAB RRR,No Gallops,Rubs or new Murmurs, No Parasternal Heave +ve B.Sounds, Abd Soft, Non tender, No organomegaly appriciated, No rebound - guarding or rigidity. Colostomy bag in place, chronic Foley No Cyanosis, Clubbing or edema, No new Rash or bruise  , stage IV coccygeal decubitus ulcer   Data Review   Micro Results Recent Results (from the past 240 hour(s))  CULTURE, BLOOD (ROUTINE X 2)     Status: None   Collection Time    09/13/12  3:56 PM      Result  Value Range Status   Specimen Description BLOOD RIGHT HAND   Final   Special Requests BOTTLES DRAWN AEROBIC AND ANAEROBIC 5CC   Final   Culture  Setup Time 09/13/2012 19:45   Final   Culture     Final   Value: KLEBSIELLA PNEUMONIAE     Note: Confirmed Extended Spectrum Beta-Lactamase Producer (ESBL) CRITICAL RESULT CALLED TO, READ BACK BY AND VERIFIED WITH: CYNTHIA WHITE 09/16/12 0950 BY SMITHERSJ     Note: Gram Stain Report Called to,Read Back By and Verified With: Gertie Gowda 09/14/12 @ 1:52PM BY RUSCA.   Report Status 09/16/2012 FINAL   Final   Organism ID, Bacteria KLEBSIELLA PNEUMONIAE   Final  CULTURE, BLOOD (ROUTINE X 2)     Status: None   Collection Time    09/13/12  4:41 PM      Result Value Range Status   Specimen Description BLOOD RIGHT HAND   Final   Special Requests BOTTLES DRAWN AEROBIC ONLY 3CC   Final   Culture  Setup Time 09/13/2012 19:45   Final   Culture NO GROWTH 5 DAYS   Final   Report Status 09/19/2012 FINAL   Final  URINE CULTURE     Status: None   Collection Time    09/13/12  6:02 PM      Result Value Range Status   Specimen Description URINE, CATHETERIZED   Final   Special Requests NONE   Final   Culture  Setup  Time 09/13/2012 19:48   Final   Colony Count >=100,000 COLONIES/ML   Final   Culture     Final   Value: Multiple bacterial morphotypes present, none predominant. Suggest appropriate recollection if clinically indicated.   Report Status 09/14/2012 FINAL   Final  URINE CULTURE     Status: None   Collection Time    09/17/12  3:19 PM      Result Value Range Status   Specimen Description URINE, CATHETERIZED   Final   Special Requests NONE   Final   Culture  Setup Time 09/18/2012 01:14   Final   Colony Count NO GROWTH   Final   Culture NO GROWTH   Final   Report Status 09/19/2012 FINAL   Final  CULTURE, BLOOD (ROUTINE X 2)     Status: None   Collection Time    09/17/12  4:15 PM      Result Value Range Status   Specimen Description BLOOD RIGHT HAND    Final   Special Requests BOTTLES DRAWN AEROBIC ONLY 3CC   Final   Culture  Setup Time 09/18/2012 00:16   Final   Culture     Final   Value:        BLOOD CULTURE RECEIVED NO GROWTH TO DATE CULTURE WILL BE HELD FOR 5 DAYS BEFORE ISSUING A FINAL NEGATIVE REPORT   Report Status PENDING   Incomplete  CULTURE, BLOOD (ROUTINE X 2)     Status: None   Collection Time    09/17/12  4:24 PM      Result Value Range Status   Specimen Description BLOOD LEFT HAND   Final   Special Requests BOTTLES DRAWN AEROBIC ONLY 3CC   Final   Culture  Setup Time 09/18/2012 00:16   Final   Culture     Final   Value:        BLOOD CULTURE RECEIVED NO GROWTH TO DATE CULTURE WILL BE HELD FOR 5 DAYS BEFORE ISSUING A FINAL NEGATIVE REPORT   Report Status PENDING   Incomplete    Radiology Reports Ir Gj Tube Change  09/01/2012   *RADIOLOGY REPORT*  Clinical Data: Old in the dual lumen gastrojejunostomy catheter  GASTROSTOMY CATHETER REPLACEMENT  Comparison: 02/05/2012  Findings: The previously placed gastrojejunostomy catheter and surrounding skin were prepped and draped in usual sterile fashion. Contrast could not be injected through the jejunal lumen. Fluoroscopic inspection demonstrated the tip of the catheter in the region of the ligament of Treitz.  A glide wire would not pass beyond the distal aspect of the catheter, which appeared to be in a true knot. For this reason, the gastrojejunostomy   catheter was cut and partially withdrawn into a 12-French peel-away sheath, which was then advanced over the catheter into the proximal duodenum.  A parallel glide wire was advanced.  The peel-away sheath and old gastrojejunostomy catheter were then removed, confirming the true knot in the distal aspect of the jejunal limb.  A new 20-French dual lumen gastrojejunostomy catheter was then advanced, positioned with the distal end of the jejunal limb in the proximal jejunum, and  the retention balloon was inflated in the gastric lumen with 10  ml sterile saline. Injection of both the gastric and jejunal limb with contrast under fluoroscopy demonstrated appropriate positioning and patency. The patient tolerated the procedure well.  No immediate complication.  Fluoroscopy time:  4 minutes 24 seconds  IMPRESSION:  Technically successful exchange of 20-French dual lumen gastrojejunostomy catheter.   Original  Report Authenticated By: D. Andria Rhein, MD   Dg Chest Port 1 View  09/13/2012   *RADIOLOGY REPORT*  Clinical Data: Urinary tract infection.  History of hypertension and diabetes.  PORTABLE CHEST - 1 VIEW  Comparison: 05/06/2012.  Findings: 1536 hours.  Right subclavian pacemaker leads are unchanged within the right atrium and right ventricle.  The heart size and mediastinal contours are stable with aortic atherosclerosis.  The overall pulmonary aeration has improved. There is no edema, confluent airspace opacity or significant pleural effusion.  The patient is status post lower cervical thoracic fusion.  IMPRESSION: No acute cardiopulmonary process.   Original Report Authenticated By: Carey Bullocks, M.D.    CBC  Recent Labs Lab 09/13/12 1556 09/14/12 0540 09/18/12 0455  WBC 21.7* 11.1* 4.1  HGB 10.9* 10.1* 9.2*  HCT 33.3* 31.6* 30.0*  PLT 351 324 303  MCV 77.6* 77.1* 76.7*  MCH 25.4* 24.6* 23.5*  MCHC 32.7 32.0 30.7  RDW 17.2* 17.3* 17.8*  LYMPHSABS 1.7  --   --   MONOABS 2.0*  --   --   EOSABS 0.0  --   --   BASOSABS 0.1  --   --     Chemistries   Recent Labs Lab 09/13/12 1556 09/14/12 0540 09/16/12 1000 09/18/12 0455  NA 133* 134* 141 140  K 5.1 4.6 3.4* 3.3*  CL 99 101 109 108  CO2 24 23 23 21   GLUCOSE 193* 174* 97 118*  BUN 43* 42* 25* 24*  CREATININE 1.70* 1.44* 1.12 0.83  CALCIUM 9.3 9.1 8.9 9.2  AST 18 20  --   --   ALT 10 11  --   --   ALKPHOS 83 80  --   --   BILITOT 0.3 0.3  --   --     ------------------------------------------------------------------------------------------------------------------ estimated creatinine clearance is 89.4 ml/min (by C-G formula based on Cr of 0.83). ------------------------------------------------------------------------------------------------------------------ No results found for this basename: HGBA1C,  in the last 72 hours ------------------------------------------------------------------------------------------------------------------ No results found for this basename: CHOL, HDL, LDLCALC, TRIG, CHOLHDL, LDLDIRECT,  in the last 72 hours ------------------------------------------------------------------------------------------------------------------ No results found for this basename: TSH, T4TOTAL, FREET3, T3FREE, THYROIDAB,  in the last 72 hours ------------------------------------------------------------------------------------------------------------------ No results found for this basename: VITAMINB12, FOLATE, FERRITIN, TIBC, IRON, RETICCTPCT,  in the last 72 hours  Coagulation profile No results found for this basename: INR, PROTIME,  in the last 168 hours  No results found for this basename: DDIMER,  in the last 72 hours  Cardiac Enzymes No results found for this basename: CK, CKMB, TROPONINI, MYOGLOBIN,  in the last 168 hours ------------------------------------------------------------------------------------------------------------------ No components found with this basename: POCBNP,

## 2012-09-19 NOTE — H&P (Signed)
[facility]  Date of Admission: 09/13/2012  Date of Consult: 09/17/2012  Reason for Consult: ESBL bacteremia in pt with Pacemaker, prior MV endocarditis , decubitus ulcer  Referring Physician: Dr. Thedore Mins  HPI:  [name] is an 77 y.o. 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 The patient has an apparent history of mitral valve endocarditis and has also had a permanent PM placed for bradycardia. He has had diverting colostomy performed at [facility] to protect his large decubitus ulcer. nd and indwelling foley catheter.  Patient had a flap done a year ago and the ulcer failed to heal and this time as per the wife patient is not a candidate for further surgery. He was brought to the hospital for fever, hypotension on 09/13/12. Admission cultures are growing ESBL Klebsiella PNA in 2/2 blood cultures. Urine cx is with >100 mixed flora. He has had no abdominal ssx and he believes his ulcer has not worsened.  He also notably has a history of prior C difficile colitis.  He has been changed from admission abx to IV imipenem and seems to have stabilized.  We were consulted to assist in the management work up of this patient with extended spectrum beta-lactamase ESBL a without a clear-cut cause who also has a pacemaker and prior history of mitral valve endocarditis.  Past Medical History   Diagnosis  Date   .  Stroke      Hx of with right hemiparesis   .  Hypertension    .  Endocarditis, mitral valve, syphilitic    .  Hypothyroidism    .  Anorexia    .  Dysphagia    .  Reflux    .  Bacteremia    .  Clostridium difficile colitis    .  CVA (cerebral vascular accident)    .  Pressure ulcer    .  Diabetes mellitus, type 2    .  Anemia    .  Stroke     Past Surgical History   Procedure  Laterality  Date   .  Colostomy     .  Peg placement     .  Hernia repair     ergies:  No Known  Allergies  Medications: I have reviewed patients current medications as documented in Epic  Anti-infectives    Start    Dose/Rate  Route  Frequency  Ordered  Stop    09/16/12 1400   imipenem-cilastatin (PRIMAXIN) 500 mg in sodium chloride 0.9 % 100 mL IVPB  500 mg  200 mL/hr over 30 Minutes  Intravenous  3 times per day  09/16/12 1020     09/14/12 2000   vancomycin (VANCOCIN) 1,250 mg in sodium chloride 0.9 % 250 mL IVPB Status: Discontinued  1,250 mg  166.7 mL/hr over 90 Minutes  Intravenous  Every 24 hours  09/14/12 0237  09/16/12 0942    09/14/12 1000   piperacillin-tazobactam (ZOSYN) IVPB 3.375 g Status: Discontinued  3.375 g  12.5 mL/hr over 240 Minutes  Intravenous  Every 8 hours  09/14/12 0906  09/16/12 1020    09/13/12 2230   vancomycin (VANCOCIN) IVPB 1000 mg/200 mL premix  1,000 mg  200 mL/hr over 60 Minutes  Intravenous  Once  09/13/12 2229  09/14/12 0056    09/13/12 2230   piperacillin-tazobactam (ZOSYN) IVPB 3.375 g  3.375 g  100 mL/hr  over 30 Minutes  Intravenous  Once  09/13/12 2229  09/14/12 0135    09/13/12 1930   cefTRIAXone (ROCEPHIN) 1 g in dextrose 5 % 50 mL IVPB  1 g  100 mL/hr over 30 Minutes  Intravenous  Once  09/13/12 1929  09/13/12 2035     Social History: reports that he has never smoked. He does not have any smokeless tobacco history on file. He reports that he does not drink alcohol or use illicit drugs.  Family History   Problem  Relation  Age of Onset   .  Cancer  Mother    .  Other  Father      old age after a fall   .  Alzheimer's disease  Sister    As in HPI and primary teams notes otherwise 12 point review of systems is negative  Blood pressure 127/72, pulse 64, temperature 98.4 F (36.9 C), temperature source Oral, resp. rate 20, height 6\' 2"  (1.88 m), weight 207 lb 0.2 oz (93.9 kg), SpO2 96.00%.  General: Alert and awake, oriented x3, not in any acute distress.  HEENT: anicteric sclera,, EOMI, oropharynx clear and without exudate  CVS regular rate,  normal r, no murmur rubs or gallops  Chest: clear to auscultation bilaterally, no wheezing, rales or rhonchi  PM site clean  Abdomen: soft nontender, nondistended, normal bowel sounds, ostlomy with liquid brown stool  Extremities: His heels have stage II decubitus ulcers present with scaling. Examination of the sacral area reveals a large stage IV decubitus ulcer. There is no obvious purulence from the wound.  Skin: no rashes  Neuro: right sided hemiparesis  Results for orders placed during the hospital encounter of 09/13/12 (from the past 48 hour(s))   GLUCOSE, CAPILLARY Status: None    Collection Time    09/15/12 3:32 PM   Result  Value  Range    Glucose-Capillary  89  70 - 99 mg/dL   GLUCOSE, CAPILLARY Status: Abnormal    Collection Time    09/15/12 7:53 PM   Result  Value  Range    Glucose-Capillary  107 (*)  70 - 99 mg/dL   GLUCOSE, CAPILLARY Status: None    Collection Time    09/15/12 11:55 PM   Result  Value  Range    Glucose-Capillary  96  70 - 99 mg/dL   GLUCOSE, CAPILLARY Status: None    Collection Time    09/16/12 3:54 AM   Result  Value  Range    Glucose-Capillary  86  70 - 99 mg/dL   GLUCOSE, CAPILLARY Status: Abnormal    Collection Time    09/16/12 7:34 AM   Result  Value  Range    Glucose-Capillary  60 (*)  70 - 99 mg/dL    Comment 1  Notify RN    GLUCOSE, CAPILLARY Status: Abnormal    Collection Time    09/16/12 8:28 AM   Result  Value  Range    Glucose-Capillary  63 (*)  70 - 99 mg/dL    Comment 1  Notify RN    GLUCOSE, CAPILLARY Status: Abnormal    Collection Time    09/16/12 9:13 AM   Result  Value  Range    Glucose-Capillary  120 (*)  70 - 99 mg/dL    Comment 1  Notify RN    BASIC METABOLIC PANEL Status: Abnormal    Collection Time    09/16/12 10:00 AM   Result  Value  Range  Sodium  141  135 - 145 mEq/L    Potassium  3.4 (*)  3.5 - 5.1 mEq/L    Chloride  109  96 - 112 mEq/L    CO2  23  19 - 32 mEq/L    Glucose, Bld  97  70 - 99 mg/dL     BUN  25 (*)  6 - 23 mg/dL    Creatinine, Ser  4.09  0.50 - 1.35 mg/dL    Calcium  8.9  8.4 - 10.5 mg/dL    GFR calc non Af Amer  62 (*)  >90 mL/min    GFR calc Af Amer  72 (*)  >90 mL/min    Comment:      The eGFR has been calculated     using the CKD EPI equation.     This calculation has not been     validated in all clinical     situations.     eGFR's persistently     <90 mL/min signify     possible Chronic Kidney Disease.   GLUCOSE, CAPILLARY Status: None    Collection Time    09/16/12 11:34 AM   Result  Value  Range    Glucose-Capillary  70  70 - 99 mg/dL   GLUCOSE, CAPILLARY Status: Abnormal    Collection Time    09/16/12 11:47 AM   Result  Value  Range    Glucose-Capillary  130 (*)  70 - 99 mg/dL    Comment 1  Notify RN    GLUCOSE, CAPILLARY Status: None    Collection Time    09/16/12 4:29 PM   Result  Value  Range    Glucose-Capillary  92  70 - 99 mg/dL    Comment 1  Notify RN    GLUCOSE, CAPILLARY Status: None    Collection Time    09/16/12 8:21 PM   Result  Value  Range    Glucose-Capillary  93  70 - 99 mg/dL    Comment 1  Notify RN    GLUCOSE, CAPILLARY Status: None    Collection Time    09/16/12 11:58 PM   Result  Value  Range    Glucose-Capillary  94  70 - 99 mg/dL   GLUCOSE, CAPILLARY Status: Abnormal    Collection Time    09/17/12 4:03 AM   Result  Value  Range    Glucose-Capillary  108 (*)  70 - 99 mg/dL    Comment 1  Documented in Chart     Comment 2  Notify RN    GLUCOSE, CAPILLARY Status: Abnormal    Collection Time    09/17/12 8:16 AM   Result  Value  Range    Glucose-Capillary  113 (*)  70 - 99 mg/dL    Comment 1  Notify RN       Component  Value  Date/Time    SDES  URINE, CATHETERIZED  09/13/2012 1802    SPECREQUEST  NONE  09/13/2012 1802    CULT  Multiple bacterial morphotypes present, none predominant. Suggest appropriate recollection if clinically indicated.  09/13/2012 1802    REPTSTATUS  09/14/2012 FINAL  09/13/2012 1802   No  results found.  Recent Results (from the past 720 hour(s))   CULTURE, BLOOD (ROUTINE X 2) Status: None    Collection Time    09/13/12 3:56 PM   Result  Value  Range  Status    Specimen Description  BLOOD RIGHT HAND   Final  Special Requests  BOTTLES DRAWN AEROBIC AND ANAEROBIC 5CC   Final    Culture Setup Time  09/13/2012 19:45   Final    Culture    Final    Value:  KLEBSIELLA PNEUMONIAE     Note: Confirmed Extended Spectrum Beta-Lactamase Producer (ESBL) CRITICAL RESULT CALLED TO, READ BACK BY AND VERIFIED WITH: CYNTHIA WHITE 09/16/12 0950 BY SMITHERSJ     Note: Gram Stain Report Called to,Read Back By and Verified With: Gertie Gowda 09/14/12 @ 1:52PM BY RUSCA.    Report Status  09/16/2012 FINAL   Final    Organism ID, Bacteria  KLEBSIELLA PNEUMONIAE   Final   CULTURE, BLOOD (ROUTINE X 2) Status: None    Collection Time    09/13/12 4:41 PM   Result  Value  Range  Status    Specimen Description  BLOOD RIGHT HAND   Final    Special Requests  BOTTLES DRAWN AEROBIC ONLY 3CC   Final    Culture Setup Time  09/13/2012 19:45   Final    Culture    Final    Value:  BLOOD CULTURE RECEIVED NO GROWTH TO DATE CULTURE WILL BE HELD FOR 5 DAYS BEFORE ISSUING A FINAL NEGATIVE REPORT    Report Status  PENDING   Incomplete   URINE CULTURE Status: None    Collection Time    09/13/12 6:02 PM   Result  Value  Range  Status    Specimen Description  URINE, CATHETERIZED   Final    Special Requests  NONE   Final    Culture Setup Time  09/13/2012 19:48   Final    Colony Count  >=100,000 COLONIES/ML   Final    Culture    Final    Value:  Multiple bacterial morphotypes present, none predominant. Suggest appropriate recollection if clinically indicated.    Report Status  09/14/2012 FINAL   Final   Impression/Recommendation  77 year old man with history of prior mitral valve endocarditis pacemaker placement for bradycardia a stroke with , large stage IV decubitus ulcer that failed graft placement now admitted  with extended spectrum beta-lactamase producing Klebsiella pneumonia bacteremia without clear cut cause. He also has a notable history for Clostridium difficile colitis.  #1 extended spectrum beta-lactamase producing Klebsiella pneumonia bacteremia: Urine could have been the source although it is dissatisfying that multiple organism isolate on urine culture. Additional portal of entry would include his very large sacral decubitus ulcer under which he may likely have chronic osteomyelitis. Additionally he could have an intra-abdominal source of infection. Finally given the fact that he has a pacemaker there is additional concern for the risk of pacemaker infection and even heart valve infection although it is not typical to see that happen with this particular organism treating it could such an endocvascular infection could be highly problematic given the resistant nature of the organism.  --I'll recheck blood cultures  --Continue carbapenem therapy and one could consider simplify him to once daily ertapenem  -- Follow up results of 2-D echocardiogram  --I. would like to get a CT of the abdomen and pelvis with oral and if possible IV contrast to evaluate for intra-abdominal sources of infection and to get better images of his decubitus ulcer.  --Will check a sedimentation rate and C-reactive protein.  --I do think we should consider obtaining a transesophageal echocardiogram  #2 large sacral decubitus ulcer: Could be source of infection again would like imaging  #3 screening all screening for HIV and hepatitis C  if this has not been done.  Thank you so much for this interesting consult  [facility]  [facility]  [phone] (pager)  [phone] (office)  09/17/2012, 3:27 PM  Paulette Blanch Dam  09/17/2012, 3:27 PM    ID requests TEE to R/O SBE  And make sure there is no infection of pacer wires  Charlton Haws

## 2012-09-19 NOTE — OR Nursing (Signed)
Report given to [place], RN about TEE procedure being cancelled.  [facility] called for transport, given a one hour ETA. Ennis Forts, Rn

## 2012-09-19 NOTE — Progress Notes (Signed)
[facility]    Subjective:  Pt is getting TEE   Antibiotics:  Anti-infectives   Start     Dose/Rate Route Frequency Ordered Stop   09/17/12 2300  [MAR Hold]  ertapenem (INVANZ) 1 g in sodium chloride 0.9 % 50 mL IVPB     (On MAR Hold since 09/19/12 1431)   1 g 100 mL/hr over 30 Minutes Intravenous Every 24 hours 09/17/12 1546     09/16/12 1400  imipenem-cilastatin (PRIMAXIN) 500 mg in sodium chloride 0.9 % 100 mL IVPB  Status:  Discontinued     500 mg 200 mL/hr over 30 Minutes Intravenous 3 times per day 09/16/12 1020 09/17/12 1546   09/14/12 2000  vancomycin (VANCOCIN) 1,250 mg in sodium chloride 0.9 % 250 mL IVPB  Status:  Discontinued     1,250 mg 166.7 mL/hr over 90 Minutes Intravenous Every 24 hours 09/14/12 0237 09/16/12 0942   09/14/12 1000  piperacillin-tazobactam (ZOSYN) IVPB 3.375 g  Status:  Discontinued     3.375 g 12.5 mL/hr over 240 Minutes Intravenous Every 8 hours 09/14/12 0906 09/16/12 1020   09/13/12 2230  vancomycin (VANCOCIN) IVPB 1000 mg/200 mL premix     1,000 mg 200 mL/hr over 60 Minutes Intravenous  Once 09/13/12 2229 09/14/12 0056   09/13/12 2230  piperacillin-tazobactam (ZOSYN) IVPB 3.375 g     3.375 g 100 mL/hr over 30 Minutes Intravenous  Once 09/13/12 2229 09/14/12 0135   09/13/12 1930  cefTRIAXone (ROCEPHIN) 1 g in dextrose 5 % 50 mL IVPB     1 g 100 mL/hr over 30 Minutes Intravenous  Once 09/13/12 1929 09/13/12 2035      Medications: Scheduled Meds: . [facility] HOLD] citalopram  10 mg Oral q morning - 10a  . [MAR HOLD] enoxaparin (LOVENOX) injection  40 mg Subcutaneous QHS  . [MAR HOLD] ertapenem  1 g Intravenous Q24H  . [MAR HOLD] feeding supplement (GLUCERNA 1.2 CAL)  1,000 mL Per Tube Q24H  . [MAR HOLD] feeding supplement (GLUCERNA 1.2 CAL)  237 mL Per Tube TID PC  . [facility] HOLD] feeding supplement  30 mL Per Tube TID  . [facility] HOLD] ferrous sulfate  300 mg Per Tube BID  . [facility] HOLD] free water  60 mL Per Tube Q6H    . [facility] HOLD] gabapentin  300 mg Per Tube QHS  . [facility] HOLD] glycopyrrolate  1 mg Oral TID  . [MAR HOLD] insulin aspart  0-9 Units Subcutaneous Q4H  . [MAR HOLD] insulin glargine  20 Units Subcutaneous QHS  . [facility] HOLD] levothyroxine  125 mcg Oral QAC breakfast  . [MAR HOLD] liothyronine  25 mcg Oral q morning - 10a  . [facility] HOLD] metoprolol tartrate  25 mg Per Tube BID  . [facility] HOLD] multivitamin  5 mL Per Tube Daily  . [facility] HOLD] scopolamine  1 patch Transdermal Q72H  . [facility] HOLD] traZODone  25 mg Oral QHS  . [facility] HOLD] vitamin C  500 mg Per Tube BID   Continuous Infusions: . sodium chloride 20 mL (09/16/12 0750)   PRN Meds:.[facility] HOLD] acetaminophen (TYLENOL) oral liquid 160 mg/5 mL, [MAR HOLD] diphenhydrAMINE, [MAR HOLD] traMADol   Objective: Weight change:   Intake/Output Summary (Last 24 hours) at 09/19/12 1512 Last data filed at 09/19/12 1400  Gross per 24 hour  Intake   1735 ml  Output    200 ml  Net   1535 ml   Blood pressure 144/83, pulse 78,  temperature 97.3 F (36.3 C), temperature source Oral, resp. rate 18, height 6\' 2"  (1.88 m), weight 207 lb 0.2 oz (93.9 kg), SpO2 98.00%. Temp:  [97.3 F (36.3 C)-98 F (36.7 C)] 97.3 F (36.3 C) (06/06 1440) Pulse Rate:  [78] 78 (06/06 0641) Resp:  [18] 18 (06/06 1440) BP: (144-154)/(83-85) 144/83 mmHg (06/06 1440) SpO2:  [97 %-98 %] 98 % (06/06 1440)  Physical Exam: General: sleeping soundly Discussed case with wife at bedside.  Lab Results:  Recent Labs  09/18/12 0455  WBC 4.1  HGB 9.2*  HCT 30.0*  PLT 303    BMET  Recent Labs  09/18/12 0455  NA 140  K 3.3*  CL 108  CO2 21  GLUCOSE 118*  BUN 24*  CREATININE 0.83  CALCIUM 9.2    Micro Results: Recent Results (from the past 240 hour(s))  CULTURE, BLOOD (ROUTINE X 2)     Status: None   Collection Time    09/13/12  3:56 PM      Result Value Range Status   Specimen Description BLOOD RIGHT HAND   Final   Special Requests BOTTLES DRAWN AEROBIC AND  ANAEROBIC 5CC   Final   Culture  Setup Time 09/13/2012 19:45   Final   Culture     Final   Value: KLEBSIELLA PNEUMONIAE     Note: Confirmed Extended Spectrum Beta-Lactamase Producer (ESBL) CRITICAL RESULT CALLED TO, READ BACK BY AND VERIFIED WITH: CYNTHIA WHITE 09/16/12 0950 BY SMITHERSJ     Note: Gram Stain Report Called to,Read Back By and Verified With: Gertie Gowda 09/14/12 @ 1:52PM BY RUSCA.   Report Status 09/16/2012 FINAL   Final   Organism ID, Bacteria KLEBSIELLA PNEUMONIAE   Final  CULTURE, BLOOD (ROUTINE X 2)     Status: None   Collection Time    09/13/12  4:41 PM      Result Value Range Status   Specimen Description BLOOD RIGHT HAND   Final   Special Requests BOTTLES DRAWN AEROBIC ONLY 3CC   Final   Culture  Setup Time 09/13/2012 19:45   Final   Culture NO GROWTH 5 DAYS   Final   Report Status 09/19/2012 FINAL   Final  URINE CULTURE     Status: None   Collection Time    09/13/12  6:02 PM      Result Value Range Status   Specimen Description URINE, CATHETERIZED   Final   Special Requests NONE   Final   Culture  Setup Time 09/13/2012 19:48   Final   Colony Count >=100,000 COLONIES/ML   Final   Culture     Final   Value: Multiple bacterial morphotypes present, none predominant. Suggest appropriate recollection if clinically indicated.   Report Status 09/14/2012 FINAL   Final  URINE CULTURE     Status: None   Collection Time    09/17/12  3:19 PM      Result Value Range Status   Specimen Description URINE, CATHETERIZED   Final   Special Requests NONE   Final   Culture  Setup Time 09/18/2012 01:14   Final   Colony Count NO GROWTH   Final   Culture NO GROWTH   Final   Report Status 09/19/2012 FINAL   Final  CULTURE, BLOOD (ROUTINE X 2)     Status: None   Collection Time    09/17/12  4:15 PM      Result Value Range Status   Specimen Description BLOOD RIGHT HAND  Final   Special Requests BOTTLES DRAWN AEROBIC ONLY 3CC   Final   Culture  Setup Time 09/18/2012 00:16   Final     Culture     Final   Value:        BLOOD CULTURE RECEIVED NO GROWTH TO DATE CULTURE WILL BE HELD FOR 5 DAYS BEFORE ISSUING A FINAL NEGATIVE REPORT   Report Status PENDING   Incomplete  CULTURE, BLOOD (ROUTINE X 2)     Status: None   Collection Time    09/17/12  4:24 PM      Result Value Range Status   Specimen Description BLOOD LEFT HAND   Final   Special Requests BOTTLES DRAWN AEROBIC ONLY 3CC   Final   Culture  Setup Time 09/18/2012 00:16   Final   Culture     Final   Value:        BLOOD CULTURE RECEIVED NO GROWTH TO DATE CULTURE WILL BE HELD FOR 5 DAYS BEFORE ISSUING A FINAL NEGATIVE REPORT   Report Status PENDING   Incomplete    Studies/Results: Ct Abdomen Pelvis W Contrast  09/18/2012   *RADIOLOGY REPORT*  Clinical Data: Bacteremia of unknown origin.  CT ABDOMEN AND PELVIS WITH CONTRAST  Technique:  Multidetector CT imaging of the abdomen and pelvis was performed following the standard protocol during bolus administration of intravenous contrast.  Contrast: OMNIPAQUE IOHEXOL 300 MG/ML  SOLN  Comparison: CT scan dated 04/30/2012  Findings: The patient has osteomyelitis of the left sacroiliac joint.  The infection has spread into the left iliacus muscle and extends down into the left internal obturator muscle along the inner aspect of the left ilium medial to the left acetabulum.  The liver, spleen, pancreas, adrenal glands, and kidneys are normal.  Gallbladder has been removed.  No dilatation of the bile ducts.  Colostomy in the left lower quadrant.  The bowel appears normal.  Foley catheter in place.  Chronic degenerative changes at both hips.  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  Infection has spread into the left iliacus muscle and into the left internal obturator where there is a subtle abscess.   Original Report Authenticated By: Francene Boyers, M.D.      Assessment/Plan: [name] is a 77 y.o. male with history of prior mitral  valve endocarditis pacemaker placement for bradycardia a stroke with , large stage IV decubitus ulcer that failed graft placement now admitted with extended spectrum beta-lactamase producing Klebsiella pneumonia bacteremia without clear cut cause. He also has a notable history for Clostridium difficile colitis. 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 Additional portal of entry would include his very large sacral decubitus ulcer under which DOES have underlying osteomyelitis with a small abscess in internal obturator muscle  He is also to have TEE right now.    --repeat blood cultures are sent NGTD --simplified to ertapenem  --followup TEE and if there are vegetations in PM leads will need to talk to Sharrell Ku from [facility] and EP. --my understanding is that [facility] have been consulted re the decubitus ulcer --pt will need a protracted course of IV abx for at  least 6 weeks given osteomyelitis that has progressed --antibiotics will need to include [facility] but may also need to include anti-MRSA coverage if MRSA found in obturator abscess or if abscess not accessilbe to drainge would need to add it empirically --he has apparently failed attempt at flap in the past    #2 large sacral decubitus ulcer:  See above discussion  #3 Obturator abscess: If there is not going to be surgery invasive enough to get to this abscess then I would ask IR if this could be aspirated for culture  IF NOT then I would add IV vancomycin to his regimen  #3 screening all screening for HIV and hepatitis C  Is negative      LOS: 6 days   Paulette Blanch Dam 09/19/2012, 3:12 PM

## 2012-09-19 NOTE — Progress Notes (Signed)
[facility] here to transport pt to [facility] for tee.

## 2012-09-19 NOTE — Progress Notes (Signed)
Notified by cmt of change in telemetry rate from first degree hb to junctional tach  Asymptomatic vss on call notified continue to monitior

## 2012-09-19 NOTE — CV Procedure (Signed)
I spoke with patients wife Patient indicated he did not want a tube down his throat She has her own reservations about procedure Patient has copious sputum and cannot protect his airway Has not takine anything PO in a long time   TTE is normal with no SBE  In my estimation the risk of sedation and TEE outweight any potential benefit  Procedure cancelled  [employer]

## 2012-09-19 NOTE — Progress Notes (Signed)
SLP Cancellation Note  Patient Details Name: [name] MRN: [id number] DOB: Sep 03, 1935   Cancelled treatment:       Reason Eval/Treat Not Completed: Patient at procedure or test/unavailable;Other (comment) (pt getting ready to transfer to [facility] for TEE per RN. )  SLP phoned SNF and spoke to LPN Sheralyn Boatman) who reports pt was on a regular/thin diet with adequate tolerance but poor appetite.  Medications were administered via PEG tube at SNF.  Pt also with excessive secretions that SNF LPN stated he would expectorate.  Phoned SNF Rehab department and was informed by representative that pt had not seen speech pathology for "a long time".    SLP to continue efforts for bedside swallow evaluation as ordered.    Donavan Burnet, MS [facility] SLP [phone]

## 2012-09-20 DIAGNOSIS — L89109 Pressure ulcer of unspecified part of back, unspecified stage: Secondary | ICD-10-CM

## 2012-09-20 LAB — CBC WITH DIFFERENTIAL/PLATELET
Basophils Absolute: 0.1 10*3/uL (ref 0.0–0.1)
Basophils Relative: 2 % — ABNORMAL HIGH (ref 0–1)
Eosinophils Absolute: 0.3 10*3/uL (ref 0.0–0.7)
Hemoglobin: 9.7 g/dL — ABNORMAL LOW (ref 13.0–17.0)
MCH: 25.3 pg — ABNORMAL LOW (ref 26.0–34.0)
MCHC: 32.2 g/dL (ref 30.0–36.0)
Monocytes Relative: 8 % (ref 3–12)
Neutro Abs: 2.3 10*3/uL (ref 1.7–7.7)
Neutrophils Relative %: 45 % (ref 43–77)
Platelets: 325 10*3/uL (ref 150–400)

## 2012-09-20 LAB — BASIC METABOLIC PANEL
Chloride: 109 mEq/L (ref 96–112)
GFR calc Af Amer: >90 mL/min (ref >90)
GFR calc non Af Amer: 82 mL/min — ABNORMAL LOW (ref >90)
Potassium: 4.1 mEq/L (ref 3.5–5.1)
Sodium: 140 mEq/L (ref 135–145)

## 2012-09-20 LAB — PROTIME-INR
INR: 1.13 (ref 0.00–1.49)
Prothrombin Time: 14.3 seconds (ref 11.6–15.2)

## 2012-09-20 LAB — GLUCOSE, CAPILLARY
Glucose-Capillary: 106 mg/dL — ABNORMAL HIGH (ref 70–99)
Glucose-Capillary: 119 mg/dL — ABNORMAL HIGH (ref 70–99)

## 2012-09-20 LAB — MAGNESIUM: Magnesium: 2.3 mg/dL (ref 1.5–2.5)

## 2012-09-20 LAB — APTT: aPTT: 33 seconds (ref 24–37)

## 2012-09-20 MED ORDER — POTASSIUM CHLORIDE CRYS ER 20 MEQ PO TBCR
40.0000 meq | EXTENDED_RELEASE_TABLET | Freq: Once | ORAL | Status: AC
  Administered 2012-09-20: 40 meq via ORAL
  Filled 2012-09-20: qty 2

## 2012-09-20 MED ORDER — SODIUM CHLORIDE 0.9 % IV SOLN
INTRAVENOUS | Status: AC
  Administered 2012-09-20: 75 mL/h via INTRAVENOUS

## 2012-09-20 NOTE — Progress Notes (Signed)
Dr. Thedore Mins made aware of resistance met with the administration of tube feeding via j port of feeding tube. No new orders.

## 2012-09-20 NOTE — Progress Notes (Signed)
Refused bed  positioning x 2

## 2012-09-20 NOTE — Progress Notes (Signed)
[facility]                                                                                Patient Demographics  [name], is a 77 y.o. male, DOB - 1935/11/02, WUJ:[id number], NFA:[id number]  Admit date - 09/13/2012  Admitting Physician Meredeth Ide, MD  Outpatient Primary MD for the patient is HASSELL Angelique Blonder, MD  LOS - 7   Chief Complaint  Patient presents with  . 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 Patient has PEG tube in place, chronic Foley catheter, colostomy bag in place. Patient has a large coccygeal decubitus ulcer which had been from time. Patient had a flap done a year ago and the ulcer failed to heal at this time as per the wife patient is not a candidate for further surgery. Colostomy was done to prevent soiling of the ulcer.  His workup so far is suggestive of Klebsiella bacteremia likely source is urine versus sacral osteo mellitus, ID and general surgery have been consulted. Continue antibiotics for now.     Assessment and Plan   1. Sepsis with Klebsiella bacteremia likely source urine versus chronic sacral ostium mellitus he also has a chronic indwelling Foley catheter. His stage IV coccygeal decubitus ulcer on CT has evidence of chronic sacral ostium mellitus with questionable small abscesses, he however also have  prosthetic mitral valve with previous history of endocarditis - ID guiding [place] with IV antibiotics, 2-D echogram appears stable, TEE was canceled by cardiology due to risk of aspiration, CT of the abdomen pelvis suggestive of likely sacral ostium mellitus with questionable small sacral abscess, general surgery has been consulted for the  same, prior to attempt aspiration if any fluid seen from the sacral site, if no fluid they will attempt bone biopsy from the sacral ostium site, repeat UA and culture along with repeat blood cultures have been ordered and negative so far,  His Foley catheter was changed during this admission. He will most likely require 6 weeks of IV antibiotics for his sacral ostieo mellitus, PICC line has been ordered, have requested IR to attempt bone biopsy versus aspiration of his questionable small abscesses in the sacral area (per IR likely myositis and not fluid collection) which will be done Monday.    2. Acute renal insufficiency. Due to sepsis from #1, resolved after IV fluids. IV fluids have been stopped.    3. Diabetes mellitus type 2. Continue on Lantus and sliding-scale insulin  Lab Results  Component Value Date   HGBA1C 8.7* 04/13/2012    CBG (last 3)   Recent Labs  09/19/12 2151 09/20/12 0448 09/20/12 0812  GLUCAP 127* 106* 87     4. Stage IV coccygeal decubitus ulcer with evidence of chronic ostium mellitus and possible deep abscesses.- This potentially could be source of his bacteremia, antibiotics to continue, 6 weeks total per  ID. I have consulted Gen. surgery also. IR to attempt bone biopsy on 09/22/2012.   5. H/O CVA with right-sided hemiparesis which is chronic, chronic bedbound status, chronic Foley catheter, PEG . - Continue supportive care, feeding, meds and free water through PEG tube, outpatient followup with PCP post discharge. Per wife he does eat by mouth also will have PT evaluate and address diet.   6. Hypothyroidism continue home dose Synthroid.   7. Malnutrition of moderate degree - continue nutritional supplementation. We'll also request speech to evaluate the patient as per wife he does eat from mouth also along with his PEG tube feeds.     Code Status: full  Family Communication: wife  Disposition Plan: SNF   Procedures TTE, TEE was canceled by  cardiology, CT Abd and Pelvis, PICC line ordered 09/20/2012, bone biopsy from sacral ostium mellitus scheduled for 09/22/2012 by IR   Consults  ID, W care, CCS, cardiology, IR   DVT Prophylaxis  Lovenox    Lab Results  Component Value Date   PLT 325 09/20/2012    Medications  Scheduled Meds: . citalopram  10 mg Oral q morning - 10a  . enoxaparin (LOVENOX) injection  40 mg Subcutaneous QHS  . ertapenem  1 g Intravenous Q24H  . feeding supplement (GLUCERNA 1.2 CAL)  1,000 mL Per Tube Q24H  . feeding supplement (GLUCERNA 1.2 CAL)  237 mL Per Tube TID PC  . feeding supplement  30 mL Per Tube TID  . ferrous sulfate  300 mg Per Tube BID  . free water  60 mL Per Tube Q6H  . gabapentin  300 mg Per Tube QHS  . glycopyrrolate  1 mg Oral TID  . insulin aspart  0-9 Units Subcutaneous Q4H  . insulin glargine  20 Units Subcutaneous QHS  . levothyroxine  125 mcg Oral QAC breakfast  . liothyronine  25 mcg Oral q morning - 10a  . metoprolol tartrate  25 mg Per Tube BID  . multivitamin  5 mL Per Tube Daily  . potassium chloride  40 mEq Oral Once  . scopolamine  1 patch Transdermal Q72H  . traZODone  25 mg Oral QHS  . vitamin C  500 mg Per Tube BID   Continuous Infusions: . sodium chloride 20 mL (09/16/12 0750)   PRN Meds:.acetaminophen (TYLENOL) oral liquid 160 mg/5 mL, diphenhydrAMINE, traMADol  Antibiotics     Anti-infectives   Start     Dose/Rate Route Frequency Ordered Stop   09/17/12 2300  ertapenem (INVANZ) 1 g in sodium chloride 0.9 % 50 mL IVPB     1 g 100 mL/hr over 30 Minutes Intravenous Every 24 hours 09/17/12 1546     09/16/12 1400  imipenem-cilastatin (PRIMAXIN) 500 mg in sodium chloride 0.9 % 100 mL IVPB  Status:  Discontinued     500 mg 200 mL/hr over 30 Minutes Intravenous 3 times per day 09/16/12 1020 09/17/12 1546   09/14/12 2000  vancomycin (VANCOCIN) 1,250 mg in sodium chloride 0.9 % 250 mL IVPB  Status:  Discontinued     1,250 mg 166.7 mL/hr over 90 Minutes  Intravenous Every 24 hours 09/14/12 0237 09/16/12 0942   09/14/12 1000  piperacillin-tazobactam (ZOSYN) IVPB 3.375 g  Status:  Discontinued     3.375 g 12.5 mL/hr over 240 Minutes Intravenous Every 8 hours 09/14/12 0906 09/16/12 1020   09/13/12 2230  vancomycin (VANCOCIN) IVPB 1000 mg/200 mL premix     1,000 mg 200 mL/hr over 60 Minutes Intravenous  Once 09/13/12 2229 09/14/12 0056   09/13/12 2230  piperacillin-tazobactam (ZOSYN) IVPB 3.375 g     3.375 g 100 mL/hr over 30 Minutes Intravenous  Once 09/13/12 2229 09/14/12 0135   09/13/12 1930  cefTRIAXone (ROCEPHIN) 1 g in dextrose 5 % 50 mL IVPB     1 g 100 mL/hr over 30 Minutes Intravenous  Once 09/13/12 1929 09/13/12 2035       Time Spent in minutes   45   Ayauna Mcnay K M.D on 09/20/2012 at 9:03 AM  Between 7am to 7pm - Pager - [phone]  After 7pm go to [email or web address] - password [email or web address]  And look for the night coverage person covering for me after hours  [facility] Office  [phone]    Subjective:   [name] today has, No headache, No chest pain, No abdominal pain - No Nausea, No new weakness tingling or numbness, No Cough - SOB.   Objective:   Filed Vitals:   09/19/12 0641 09/19/12 1440 09/19/12 2002 09/20/12 0628  BP: 154/85 144/83 109/72 101/57  Pulse: 78   74  Temp: 98 F (36.7 C) 97.3 F (36.3 C) 98 F (36.7 C) 97.9 F (36.6 C)  TempSrc: Oral Oral Oral Oral  Resp: 18 18 18 18   Height:      Weight:      SpO2: 97% 98% 98% 98%    Wt Readings from Last 3 Encounters:  09/13/12 93.9 kg (207 lb 0.2 oz)  09/13/12 93.9 kg (207 lb 0.2 oz)  05/04/12 111 kg (244 lb 11.4 oz)     Intake/Output Summary (Last 24 hours) at 09/20/12 0903 Last data filed at 09/20/12 0600  Gross per 24 hour  Intake    950 ml  Output    700 ml  Net    250 ml    Exam Awake Alert, Oriented X 3, No new F.N deficits, Normal affect, chronic right-sided hemiparesis from previous CVA [place].AT,PERRAL Supple Neck,No  JVD, No cervical lymphadenopathy appriciated.  Symmetrical Chest wall movement, Good air movement bilaterally, CTAB RRR,No Gallops,Rubs or new Murmurs, No Parasternal Heave +ve B.Sounds, Abd Soft, Non tender, No organomegaly appriciated, No rebound - guarding or rigidity. Colostomy bag in place, chronic Foley No Cyanosis, Clubbing or edema, No new Rash or bruise  , stage IV coccygeal decubitus ulcer   Data Review   Micro Results Recent Results (from the past 240 hour(s))  CULTURE, BLOOD (ROUTINE X 2)     Status: None   Collection Time    09/13/12  3:56 PM      Result Value Range Status   Specimen Description BLOOD RIGHT HAND   Final   Special Requests BOTTLES DRAWN AEROBIC AND ANAEROBIC 5CC   Final   Culture  Setup Time 09/13/2012 19:45   Final   Culture     Final   Value: KLEBSIELLA PNEUMONIAE     Note: Confirmed Extended Spectrum Beta-Lactamase Producer (ESBL) CRITICAL RESULT CALLED TO, READ BACK BY AND VERIFIED WITH: CYNTHIA WHITE 09/16/12 0950 BY SMITHERSJ     Note: Gram Stain Report Called to,Read Back By and Verified With: Gertie Gowda 09/14/12 @ 1:52PM BY RUSCA.   Report Status 09/16/2012 FINAL   Final   Organism ID, Bacteria KLEBSIELLA PNEUMONIAE   Final  CULTURE, BLOOD (ROUTINE X 2)     Status: None   Collection Time    09/13/12  4:41 PM      Result Value Range Status   Specimen Description BLOOD RIGHT HAND  Final   Special Requests BOTTLES DRAWN AEROBIC ONLY 3CC   Final   Culture  Setup Time 09/13/2012 19:45   Final   Culture NO GROWTH 5 DAYS   Final   Report Status 09/19/2012 FINAL   Final  URINE CULTURE     Status: None   Collection Time    09/13/12  6:02 PM      Result Value Range Status   Specimen Description URINE, CATHETERIZED   Final   Special Requests NONE   Final   Culture  Setup Time 09/13/2012 19:48   Final   Colony Count >=100,000 COLONIES/ML   Final   Culture     Final   Value: Multiple bacterial morphotypes present, none predominant. Suggest appropriate  recollection if clinically indicated.   Report Status 09/14/2012 FINAL   Final  URINE CULTURE     Status: None   Collection Time    09/17/12  3:19 PM      Result Value Range Status   Specimen Description URINE, CATHETERIZED   Final   Special Requests NONE   Final   Culture  Setup Time 09/18/2012 01:14   Final   Colony Count NO GROWTH   Final   Culture NO GROWTH   Final   Report Status 09/19/2012 FINAL   Final  CULTURE, BLOOD (ROUTINE X 2)     Status: None   Collection Time    09/17/12  4:15 PM      Result Value Range Status   Specimen Description BLOOD RIGHT HAND   Final   Special Requests BOTTLES DRAWN AEROBIC ONLY 3CC   Final   Culture  Setup Time 09/18/2012 00:16   Final   Culture     Final   Value:        BLOOD CULTURE RECEIVED NO GROWTH TO DATE CULTURE WILL BE HELD FOR 5 DAYS BEFORE ISSUING A FINAL NEGATIVE REPORT   Report Status PENDING   Incomplete  CULTURE, BLOOD (ROUTINE X 2)     Status: None   Collection Time    09/17/12  4:24 PM      Result Value Range Status   Specimen Description BLOOD LEFT HAND   Final   Special Requests BOTTLES DRAWN AEROBIC ONLY 3CC   Final   Culture  Setup Time 09/18/2012 00:16   Final   Culture     Final   Value:        BLOOD CULTURE RECEIVED NO GROWTH TO DATE CULTURE WILL BE HELD FOR 5 DAYS BEFORE ISSUING A FINAL NEGATIVE REPORT   Report Status PENDING   Incomplete    Radiology Reports Ir Gj Tube Change  09/01/2012   *RADIOLOGY REPORT*  Clinical Data: Old in the dual lumen gastrojejunostomy catheter  GASTROSTOMY CATHETER REPLACEMENT  Comparison: 02/05/2012  Findings: The previously placed gastrojejunostomy catheter and surrounding skin were prepped and draped in usual sterile fashion. Contrast could not be injected through the jejunal lumen. Fluoroscopic inspection demonstrated the tip of the catheter in the region of the ligament of Treitz.  A glide wire would not pass beyond the distal aspect of the catheter, which appeared to be in a true  knot. For this reason, the gastrojejunostomy   catheter was cut and partially withdrawn into a 12-French peel-away sheath, which was then advanced over the catheter into the proximal duodenum.  A parallel glide wire was advanced.  The peel-away sheath and old gastrojejunostomy catheter were then removed, confirming the true knot in the distal aspect  of the jejunal limb.  A new 20-French dual lumen gastrojejunostomy catheter was then advanced, positioned with the distal end of the jejunal limb in the proximal jejunum, and  the retention balloon was inflated in the gastric lumen with 10 ml sterile saline. Injection of both the gastric and jejunal limb with contrast under fluoroscopy demonstrated appropriate positioning and patency. The patient tolerated the procedure well.  No immediate complication.  Fluoroscopy time:  4 minutes 24 seconds  IMPRESSION:  Technically successful exchange of 20-French dual lumen gastrojejunostomy catheter.   Original Report Authenticated By: D. Andria Rhein, MD   Dg Chest Port 1 View  09/13/2012   *RADIOLOGY REPORT*  Clinical Data: Urinary tract infection.  History of hypertension and diabetes.  PORTABLE CHEST - 1 VIEW  Comparison: 05/06/2012.  Findings: 1536 hours.  Right subclavian pacemaker leads are unchanged within the right atrium and right ventricle.  The heart size and mediastinal contours are stable with aortic atherosclerosis.  The overall pulmonary aeration has improved. There is no edema, confluent airspace opacity or significant pleural effusion.  The patient is status post lower cervical thoracic fusion.  IMPRESSION: No acute cardiopulmonary process.   Original Report Authenticated By: Carey Bullocks, M.D.    CBC  Recent Labs Lab 09/13/12 1556 09/14/12 0540 09/18/12 0455 09/20/12 0801  WBC 21.7* 11.1* 4.1 5.2  HGB 10.9* 10.1* 9.2* 9.7*  HCT 33.3* 31.6* 30.0* 30.1*  PLT 351 324 303 325  MCV 77.6* 77.1* 76.7* 78.4  MCH 25.4* 24.6* 23.5* 25.3*  MCHC 32.7  32.0 30.7 32.2  RDW 17.2* 17.3* 17.8* 18.3*  LYMPHSABS 1.7  --   --  2.1  MONOABS 2.0*  --   --  0.4  EOSABS 0.0  --   --  0.3  BASOSABS 0.1  --   --  0.1    Chemistries   Recent Labs Lab 09/13/12 1556 09/14/12 0540 09/16/12 1000 09/18/12 0455 09/20/12 0801  NA 133* 134* 141 140 140  K 5.1 4.6 3.4* 3.3* 4.1  CL 99 101 109 108 109  CO2 24 23 23 21 22   GLUCOSE 193* 174* 97 118* 105*  BUN 43* 42* 25* 24* 26*  CREATININE 1.70* 1.44* 1.12 0.83 0.89  CALCIUM 9.3 9.1 8.9 9.2 9.2  MG  --   --   --   --  2.3  AST 18 20  --   --   --   ALT 10 11  --   --   --   ALKPHOS 83 80  --   --   --   BILITOT 0.3 0.3  --   --   --    ------------------------------------------------------------------------------------------------------------------ estimated creatinine clearance is 83.4 ml/min (by C-G formula based on Cr of 0.89). ------------------------------------------------------------------------------------------------------------------ No results found for this basename: HGBA1C,  in the last 72 hours ------------------------------------------------------------------------------------------------------------------ No results found for this basename: CHOL, HDL, LDLCALC, TRIG, CHOLHDL, LDLDIRECT,  in the last 72 hours ------------------------------------------------------------------------------------------------------------------ No results found for this basename: TSH, T4TOTAL, FREET3, T3FREE, THYROIDAB,  in the last 72 hours ------------------------------------------------------------------------------------------------------------------ No results found for this basename: VITAMINB12, FOLATE, FERRITIN, TIBC, IRON, RETICCTPCT,  in the last 72 hours  Coagulation profile  Recent Labs Lab 09/20/12 0801  INR 1.13    No results found for this basename: DDIMER,  in the last 72 hours  Cardiac Enzymes No results found for this basename: CK, CKMB, TROPONINI, MYOGLOBIN,  in the last 168  hours ------------------------------------------------------------------------------------------------------------------ No components found with this basename: POCBNP,

## 2012-09-20 NOTE — Consult Note (Addendum)
  We were asked to see him for evaluation of his decubitus ulcer. He was seen and examined.  His sacral wound actually looks pretty good.  There is no evidence of external infection and I do not see any necrotic tissue requiring debridement.  He does not have any cellulitis or induration or fluctuance.  I changed his dressing today.  I have reviewed his Ct and he may have deeper infection but not in an area drainable by general surgery.  I would continue with intensive wound care for this decubitus ulcer but I dont think that any local debridement by general surgery would aid his care.    This certainly may be the source of his infection.  He does have some visible presacral fascia at the base of the wound and he likely does have some osteomyelitis, but again, I am not sure that general surgery can offer anything in this regard.

## 2012-09-20 NOTE — Evaluation (Signed)
Clinical/Bedside Swallow Evaluation Patient Details  Name: [name] MRN: [id number] Date of Birth: 1936/02/03  Today's Date: 09/20/2012 Time: 1415-1430 SLP Time Calculation (min): 15 min  Past Medical History:  Past Medical History  Diagnosis Date  . Stroke     Hx of with right hemiparesis  . Hypertension   . Endocarditis, mitral valve, syphilitic   . Hypothyroidism   . Anorexia   . Dysphagia   . Reflux   . Bacteremia   . Clostridium difficile colitis   . CVA (cerebral vascular accident)   . Pressure ulcer   . Diabetes mellitus, type 2   . Anemia   . Stroke    Past Surgical History:  Past Surgical History  Procedure Laterality Date  . Colostomy    . Peg placement    . 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 Patient has PEG tube in place, chronic Foley catheter, colostomy bag in place. Patient has a large decubitus ulcer which had been from time. Patient had a flap done a year ago and the ulcer failed to heal at this time as per the wife patient is not a candidate for further surgery. Colostomy was done to prevent soiling of the ulcer. Today he was brought to the hospital for fever, hypotension. Patient is found to have elevated white count, and abnormal UA. He denies chest pain shortness of breath no nausea vomiting or diarrhea. He denies pain at this time.  CXR 09/13/12 revealed No acute cardiopulmonary process.   Assessment / Plan / Recommendation Clinical Impression  Swallow assessment completed with wife at bedside.  She reports pt. received PEG approximately 2 1/2 yrs ago due to decreased oral intake.  She state he was on thick liquids at one time but refused to drink them. Pt. noted to have mild amount of pooled saliva in anterior bilateral oral cavity.  Pt. exhibited decreased hyolaryngeal elevation during  palpation of volitional swallow as well as during po trials.  Laryngeal elevation also appeared discoordinated.  Multiple swallows present with small sips/bites indicative of possible pharyngeal residue.  No cough, throat clear or wet vocal quality, however unable to determine silent aspiration at bedside.  CXR 09/13/12 was negative for acute process.  SLP recommends he continue on Dys 3 texture and thin liquids closley following swallow precautions.  ST will return Monday to determine appropriateness/safety with diet or need for objective evaluation.      Aspiration Risk  Moderate    Diet Recommendation Dysphagia 3 (Mechanical Soft);Thin liquid   Liquid Administration via: Cup Medication Administration: Whole meds with puree Supervision: Patient able to self feed;Full supervision/cueing for compensatory strategies Compensations: Slow rate;Small sips/bites;Multiple dry swallows after each bite/sip Postural Changes and/or Swallow Maneuvers: Seated upright 90 degrees    Other  Recommendations Oral Care Recommendations: Oral care BID   Follow Up Recommendations   (TBD)    Frequency and Duration min 2x/week  2 weeks   Pertinent Vitals/Pain none    SLP Swallow Goals Patient will utilize recommended strategies during swallow to increase swallowing safety with: Minimal cueing   Swallow Study Prior Functional Status       General HPI: 77 year old male with a 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 Patient has PEG tube in place, chronic Foley catheter, colostomy bag in place. Patient has a large decubitus ulcer which had been from time. Patient had a flap done a year ago and the ulcer failed to heal at this time as per the wife patient is not a candidate for further surgery. Colostomy was done to prevent soiling of the ulcer. Today he was brought to the hospital for fever,  hypotension. Patient is found to have elevated white count, and abnormal UA. He denies chest pain shortness of breath no nausea vomiting or diarrhea. He denies pain at this time.  CXR 09/13/12 revealed No acute cardiopulmonary process. Type of Study: Bedside swallow evaluation Previous Swallow Assessment:  (2012, unable to locate results) Diet Prior to this Study: Dysphagia 3 (soft);Thin liquids Temperature Spikes Noted: No Respiratory Status: Room air History of Recent Intubation: No Behavior/Cognition: Alert Oral Cavity - Dentition: Adequate natural dentition Self-Feeding Abilities: Able to feed self;Needs set up Patient Positioning: Upright in bed Baseline Vocal Quality: Clear Volitional Cough: Weak Volitional Swallow:  (difficult to initiate)    Oral/Motor/Sensory Function Overall Oral Motor/Sensory Function: Appears within functional limits for tasks assessed   Ice Chips Ice chips: Not tested   Thin Liquid Thin Liquid: Impaired Pharyngeal  Phase Impairments: Decreased hyoid-laryngeal movement;Multiple swallows    Nectar Thick Nectar Thick Liquid: Not tested   Honey Thick Honey Thick Liquid: Not tested   Puree Puree:  (pt. refused applesauce)   Solid   GO    Solid: Within functional limits       Breck Coons [employer] [phone]  09/20/2012

## 2012-09-21 LAB — GLUCOSE, CAPILLARY: Glucose-Capillary: 87 mg/dL (ref 70–99)

## 2012-09-21 MED ORDER — INSULIN ASPART 100 UNIT/ML ~~LOC~~ SOLN: 0.0000 [IU] | Freq: Three times a day (TID) | SUBCUTANEOUS | Status: DC

## 2012-09-21 NOTE — Plan of Care (Signed)
Problem: Phase I Progression Outcomes Goal: Initial discharge plan identified Outcome: Completed/Met Date Met:  09/21/12 Back to [facility]

## 2012-09-21 NOTE — Progress Notes (Signed)
[facility]                                                                                Patient Demographics  [name], is a 77 y.o. male, DOB - 1935/07/15, ZOX:[id number], WJX:[id number]  Admit date - 09/13/2012  Admitting Physician Meredeth Ide, MD  Outpatient Primary MD for the patient is HASSELL Angelique Blonder, MD  LOS - 8   Chief Complaint  Patient presents with  . 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 Patient has PEG tube in place, chronic Foley catheter, colostomy bag in place. Patient has a large coccygeal decubitus ulcer which had been from time. Patient had a flap done a year ago and the ulcer failed to heal at this time as per the wife patient is not a candidate for further surgery. Colostomy was done to prevent soiling of the ulcer.  His workup so far is suggestive of Klebsiella bacteremia likely source is urine versus sacral osteo mellitus, ID and general surgery have been consulted. Continue antibiotics for now.     Assessment and Plan   1. Sepsis with Klebsiella bacteremia likely source urine versus chronic sacral ostium mellitus he also has a chronic indwelling Foley catheter. His stage IV coccygeal decubitus ulcer on CT has evidence of chronic sacral ostium mellitus with questionable small abscesses, he however also have  prosthetic mitral valve with previous history of endocarditis - ID guiding [place] with IV antibiotics, 2-D echogram appears stable, TEE was canceled by cardiology due to risk of aspiration, CT of the abdomen pelvis suggestive of likely sacral ostium mellitus with questionable small sacral abscess, general surgery has been consulted for the  same, prior to attempt aspiration if any fluid seen from the sacral site, if no fluid they will attempt bone biopsy from the sacral ostium site, repeat UA and culture along with repeat blood cultures have been ordered and negative so far,  His Foley catheter was changed during this admission. He will most likely require 6 weeks of IV antibiotics for his sacral ostieo mellitus, PICC line has been ordered, have requested IR to attempt bone biopsy versus aspiration of his questionable small abscesses in the sacral area (per IR likely myositis and not fluid collection) which will be done Monday.    2. Acute renal insufficiency. Due to sepsis from #1, resolved after IV fluids. IV fluids have been stopped.    3. Diabetes mellitus type 2. Continue on Lantus and sliding-scale insulin at reduced dose and frequency  Lab Results  Component Value Date   HGBA1C 8.7* 04/13/2012    CBG (last 3)   Recent Labs  09/21/12 0130 09/21/12 0407 09/21/12 0735  GLUCAP 88 86 87     4. 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 I have consulted Gen. surgery also. IR to attempt bone biopsy on 09/22/2012.   5. H/O CVA with right-sided hemiparesis which is chronic, chronic bedbound status, chronic Foley catheter, PEG . - Continue supportive care, feeding, meds and free water through PEG tube, outpatient followup with PCP post discharge. Per wife he does eat by mouth also will have PT evaluate and address diet.   6. Hypothyroidism continue home dose Synthroid.   7. Malnutrition of moderate degree - continue nutritional supplementation. Each has evaluated and he's taking dysphagia 3 diet orally along with his PEG tube feeds. Will have outpatient nutritional consultation for calorie intake and to see if PEG tube still needs to be used.     Code  Status: full  Family Communication: wife  Disposition Plan: SNF   Procedures TTE, TEE was canceled by cardiology, CT Abd and Pelvis, PICC line ordered 09/20/2012, bone biopsy from sacral ostium mellitus scheduled for 09/22/2012 by IR   Consults  ID, W care, CCS, cardiology, IR   DVT Prophylaxis  Lovenox    Lab Results  Component Value Date   PLT 325 09/20/2012    Medications  Scheduled Meds: . citalopram  10 mg Oral q morning - 10a  . enoxaparin (LOVENOX) injection  40 mg Subcutaneous QHS  . ertapenem  1 g Intravenous Q24H  . feeding supplement (GLUCERNA 1.2 CAL)  1,000 mL Per Tube Q24H  . feeding supplement (GLUCERNA 1.2 CAL)  237 mL Per Tube TID PC  . feeding supplement  30 mL Per Tube TID  . ferrous sulfate  300 mg Per Tube BID  . free water  60 mL Per Tube Q6H  . gabapentin  300 mg Per Tube QHS  . glycopyrrolate  1 mg Oral TID  . insulin aspart  0-9 Units Subcutaneous Q8H  . insulin glargine  20 Units Subcutaneous QHS  . levothyroxine  125 mcg Oral QAC breakfast  . liothyronine  25 mcg Oral q morning - 10a  . metoprolol tartrate  25 mg Per Tube BID  . multivitamin  5 mL Per Tube Daily  . scopolamine  1 patch Transdermal Q72H  . traZODone  25 mg Oral QHS  . vitamin C  500 mg Per Tube BID   Continuous Infusions: . sodium chloride 20 mL (09/16/12 0750)   PRN Meds:.acetaminophen (TYLENOL) oral liquid 160 mg/5 mL, diphenhydrAMINE, traMADol  Antibiotics     Anti-infectives   Start     Dose/Rate Route Frequency Ordered Stop   09/17/12 2300  ertapenem (INVANZ) 1 g in sodium chloride 0.9 % 50 mL IVPB     1 g 100 mL/hr over 30 Minutes Intravenous Every 24 hours 09/17/12 1546     09/16/12 1400  imipenem-cilastatin (PRIMAXIN) 500 mg in sodium chloride 0.9 % 100 mL IVPB  Status:  Discontinued     500 mg 200 mL/hr over 30 Minutes Intravenous 3 times per day 09/16/12 1020 09/17/12 1546   09/14/12 2000  vancomycin (VANCOCIN) 1,250 mg in sodium chloride 0.9 % 250 mL IVPB   Status:  Discontinued     1,250 mg 166.7 mL/hr over 90 Minutes Intravenous Every 24 hours 09/14/12 0237 09/16/12 0942   09/14/12 1000  piperacillin-tazobactam (ZOSYN) IVPB 3.375 g  Status:  Discontinued     3.375 g 12.5 mL/hr over 240 Minutes Intravenous Every 8 hours 09/14/12 0906 09/16/12 1020   09/13/12 2230  vancomycin (VANCOCIN) IVPB 1000 mg/200 mL premix  1,000 mg 200 mL/hr over 60 Minutes Intravenous  Once 09/13/12 2229 09/14/12 0056   09/13/12 2230  piperacillin-tazobactam (ZOSYN) IVPB 3.375 g     3.375 g 100 mL/hr over 30 Minutes Intravenous  Once 09/13/12 2229 09/14/12 0135   09/13/12 1930  cefTRIAXone (ROCEPHIN) 1 g in dextrose 5 % 50 mL IVPB     1 g 100 mL/hr over 30 Minutes Intravenous  Once 09/13/12 1929 09/13/12 2035       Time Spent in minutes   45   SINGH,PRASHANT K M.D on 09/21/2012 at 9:21 AM  Between 7am to 7pm - Pager - [phone]  After 7pm go to [email or web address] - password [email or web address]  And look for the night coverage person covering for me after hours  [facility] Office  [phone]    Subjective:   [place] today has, No headache, No chest pain, No abdominal pain - No Nausea, No new weakness tingling or numbness, No Cough - SOB.   Objective:   Filed Vitals:   09/19/12 2002 09/20/12 0628 09/20/12 1300 09/21/12 0540  BP: 109/72 101/57 150/77 149/84  Pulse:  74 77 73  Temp:  97.9 F (36.6 C) 97.7 F (36.5 C) 98.1 F (36.7 C)  TempSrc: Oral Oral Oral Oral  Resp: 18 18 18 18   Height:      Weight:      SpO2: 98% 98% 98% 100%    Wt Readings from Last 3 Encounters:  09/13/12 93.9 kg (207 lb 0.2 oz)  09/13/12 93.9 kg (207 lb 0.2 oz)  05/04/12 111 kg (244 lb 11.4 oz)     Intake/Output Summary (Last 24 hours) at 09/21/12 0921 Last data filed at 09/21/12 0600  Gross per 24 hour  Intake   1666 ml  Output   2100 ml  Net   -434 ml    Exam Awake Alert, Oriented X 3, No new F.N deficits, Normal affect, chronic right-sided  hemiparesis from previous CVA [name].AT,PERRAL Supple Neck,No JVD, No cervical lymphadenopathy appriciated.  Symmetrical Chest wall movement, Good air movement bilaterally, CTAB RRR,No Gallops,Rubs or new Murmurs, No Parasternal Heave +ve B.Sounds, Abd Soft, Non tender, No organomegaly appriciated, No rebound - guarding or rigidity. Colostomy bag in place, chronic Foley No Cyanosis, Clubbing or edema, No new Rash or bruise  , stage IV coccygeal decubitus ulcer   Data Review   Micro Results Recent Results (from the past 240 hour(s))  CULTURE, BLOOD (ROUTINE X 2)     Status: None   Collection Time    09/13/12  3:56 PM      Result Value Range Status   Specimen Description BLOOD RIGHT HAND   Final   Special Requests BOTTLES DRAWN AEROBIC AND ANAEROBIC 5CC   Final   Culture  Setup Time 09/13/2012 19:45   Final   Culture     Final   Value: KLEBSIELLA PNEUMONIAE     Note: Confirmed Extended Spectrum Beta-Lactamase Producer (ESBL) CRITICAL RESULT CALLED TO, READ BACK BY AND VERIFIED WITH: CYNTHIA WHITE 09/16/12 0950 BY SMITHERSJ     Note: Gram Stain Report Called to,Read Back By and Verified With: Gertie Gowda 09/14/12 @ 1:52PM BY RUSCA.   Report Status 09/16/2012 FINAL   Final   Organism ID, Bacteria KLEBSIELLA PNEUMONIAE   Final  CULTURE, BLOOD (ROUTINE X 2)     Status: None   Collection Time    09/13/12  4:41 PM      Result Value Range Status   Specimen Description  BLOOD RIGHT HAND   Final   Special Requests BOTTLES DRAWN AEROBIC ONLY 3CC   Final   Culture  Setup Time 09/13/2012 19:45   Final   Culture NO GROWTH 5 DAYS   Final   Report Status 09/19/2012 FINAL   Final  URINE CULTURE     Status: None   Collection Time    09/13/12  6:02 PM      Result Value Range Status   Specimen Description URINE, CATHETERIZED   Final   Special Requests NONE   Final   Culture  Setup Time 09/13/2012 19:48   Final   Colony Count >=100,000 COLONIES/ML   Final   Culture     Final   Value: Multiple  bacterial morphotypes present, none predominant. Suggest appropriate recollection if clinically indicated.   Report Status 09/14/2012 FINAL   Final  URINE CULTURE     Status: None   Collection Time    09/17/12  3:19 PM      Result Value Range Status   Specimen Description URINE, CATHETERIZED   Final   Special Requests NONE   Final   Culture  Setup Time 09/18/2012 01:14   Final   Colony Count NO GROWTH   Final   Culture NO GROWTH   Final   Report Status 09/19/2012 FINAL   Final  CULTURE, BLOOD (ROUTINE X 2)     Status: None   Collection Time    09/17/12  4:15 PM      Result Value Range Status   Specimen Description BLOOD RIGHT HAND   Final   Special Requests BOTTLES DRAWN AEROBIC ONLY 3CC   Final   Culture  Setup Time 09/18/2012 00:16   Final   Culture     Final   Value:        BLOOD CULTURE RECEIVED NO GROWTH TO DATE CULTURE WILL BE HELD FOR 5 DAYS BEFORE ISSUING A FINAL NEGATIVE REPORT   Report Status PENDING   Incomplete  CULTURE, BLOOD (ROUTINE X 2)     Status: None   Collection Time    09/17/12  4:24 PM      Result Value Range Status   Specimen Description BLOOD LEFT HAND   Final   Special Requests BOTTLES DRAWN AEROBIC ONLY 3CC   Final   Culture  Setup Time 09/18/2012 00:16   Final   Culture     Final   Value:        BLOOD CULTURE RECEIVED NO GROWTH TO DATE CULTURE WILL BE HELD FOR 5 DAYS BEFORE ISSUING A FINAL NEGATIVE REPORT   Report Status PENDING   Incomplete    Radiology Reports Ir Gj Tube Change  09/01/2012   *RADIOLOGY REPORT*  Clinical Data: Old in the dual lumen gastrojejunostomy catheter  GASTROSTOMY CATHETER REPLACEMENT  Comparison: 02/05/2012  Findings: The previously placed gastrojejunostomy catheter and surrounding skin were prepped and draped in usual sterile fashion. Contrast could not be injected through the jejunal lumen. Fluoroscopic inspection demonstrated the tip of the catheter in the region of the ligament of Treitz.  A glide wire would not pass beyond  the distal aspect of the catheter, which appeared to be in a true knot. For this reason, the gastrojejunostomy   catheter was cut and partially withdrawn into a 12-French peel-away sheath, which was then advanced over the catheter into the proximal duodenum.  A parallel glide wire was advanced.  The peel-away sheath and old gastrojejunostomy catheter were then removed, confirming the true  knot in the distal aspect of the jejunal limb.  A new 20-French dual lumen gastrojejunostomy catheter was then advanced, positioned with the distal end of the jejunal limb in the proximal jejunum, and  the retention balloon was inflated in the gastric lumen with 10 ml sterile saline. Injection of both the gastric and jejunal limb with contrast under fluoroscopy demonstrated appropriate positioning and patency. The patient tolerated the procedure well.  No immediate complication.  Fluoroscopy time:  4 minutes 24 seconds  IMPRESSION:  Technically successful exchange of 20-French dual lumen gastrojejunostomy catheter.   Original Report Authenticated By: D. Andria Rhein, MD   Dg Chest Port 1 View  09/13/2012   *RADIOLOGY REPORT*  Clinical Data: Urinary tract infection.  History of hypertension and diabetes.  PORTABLE CHEST - 1 VIEW  Comparison: 05/06/2012.  Findings: 1536 hours.  Right subclavian pacemaker leads are unchanged within the right atrium and right ventricle.  The heart size and mediastinal contours are stable with aortic atherosclerosis.  The overall pulmonary aeration has improved. There is no edema, confluent airspace opacity or significant pleural effusion.  The patient is status post lower cervical thoracic fusion.  IMPRESSION: No acute cardiopulmonary process.   Original Report Authenticated By: Carey Bullocks, M.D.    CBC  Recent Labs Lab 09/18/12 0455 09/20/12 0801  WBC 4.1 5.2  HGB 9.2* 9.7*  HCT 30.0* 30.1*  PLT 303 325  MCV 76.7* 78.4  MCH 23.5* 25.3*  MCHC 30.7 32.2  RDW 17.8* 18.3*  LYMPHSABS   --  2.1  MONOABS  --  0.4  EOSABS  --  0.3  BASOSABS  --  0.1    Chemistries   Recent Labs Lab 09/16/12 1000 09/18/12 0455 09/20/12 0801  NA 141 140 140  K 3.4* 3.3* 4.1  CL 109 108 109  CO2 23 21 22   GLUCOSE 97 118* 105*  BUN 25* 24* 26*  CREATININE 1.12 0.83 0.89  CALCIUM 8.9 9.2 9.2  MG  --   --  2.3   ------------------------------------------------------------------------------------------------------------------ estimated creatinine clearance is 83.4 ml/min (by C-G formula based on Cr of 0.89). ------------------------------------------------------------------------------------------------------------------ No results found for this basename: HGBA1C,  in the last 72 hours ------------------------------------------------------------------------------------------------------------------ No results found for this basename: CHOL, HDL, LDLCALC, TRIG, CHOLHDL, LDLDIRECT,  in the last 72 hours ------------------------------------------------------------------------------------------------------------------ No results found for this basename: TSH, T4TOTAL, FREET3, T3FREE, THYROIDAB,  in the last 72 hours ------------------------------------------------------------------------------------------------------------------ No results found for this basename: VITAMINB12, FOLATE, FERRITIN, TIBC, IRON, RETICCTPCT,  in the last 72 hours  Coagulation profile  Recent Labs Lab 09/20/12 0801  INR 1.13    No results found for this basename: DDIMER,  in the last 72 hours  Cardiac Enzymes No results found for this basename: CK, CKMB, TROPONINI, MYOGLOBIN,  in the last 168 hours ------------------------------------------------------------------------------------------------------------------ No components found with this basename: POCBNP,

## 2012-09-21 NOTE — Progress Notes (Signed)
Patient ID: [name], male   DOB: 11-23-1935, 77 y.o.   MRN: [id number] Request received for left iliac bone biopsy for culture on pt. 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 Pt had klebsiella from urine culture in 04/2012 and proteus from urine culture 06/2012 which was multidrug resistant. Imaging studies were reviewed by Dr. Archer Asa. Additional PMH as below. Exam: pt awake/ alert; chronic rt sided HP; chest- CTA bilat ant; heart- RRR; abd- soft,+BS,NT,LLQ colostomy, GJ tube intact; ext- no edema.  Filed Vitals:   09/19/12 2002 09/20/12 0628 09/20/12 1300 09/21/12 0540  BP: 109/72 101/57 150/77 149/84  Pulse:  74 77 73  Temp:  97.9 F (36.6 C) 97.7 F (36.5 C) 98.1 F (36.7 C)  TempSrc: Oral Oral Oral Oral  Resp: 18 18 18 18   Height:      Weight:      SpO2: 98% 98% 98% 100%    Past Medical History  Diagnosis Date  . Stroke     Hx of with right hemiparesis  . Hypertension   . Endocarditis, mitral valve, syphilitic   . Hypothyroidism   . Anorexia   . Dysphagia   . Reflux   . Bacteremia   . Clostridium difficile colitis   . CVA (cerebral vascular accident)   . Pressure ulcer   . Diabetes mellitus, type 2   . Anemia   . Stroke    Past Surgical History  Procedure Laterality Date  . Colostomy    . Peg placement    . Hernia repair    Ct Abdomen Pelvis W Contrast  09/18/2012   *RADIOLOGY REPORT*  Clinical Data: Bacteremia of unknown origin.  CT ABDOMEN AND PELVIS WITH CONTRAST  Technique:  Multidetector CT imaging of the abdomen and pelvis was performed following the standard protocol during bolus administration of intravenous contrast.  Contrast: OMNIPAQUE IOHEXOL 300  MG/ML  SOLN  Comparison: CT scan dated 04/30/2012  Findings: The patient has osteomyelitis of the left sacroiliac joint.  The infection has spread into the left iliacus muscle and extends down into the left internal obturator muscle along the inner aspect of the left ilium medial to the left acetabulum.  The liver, spleen, pancreas, adrenal glands, and kidneys are normal.  Gallbladder has been removed.  No dilatation of the bile ducts.  Colostomy in the left lower quadrant.  The bowel appears normal.  Foley catheter in place.  Chronic degenerative changes at both hips.  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  Infection has spread into the left iliacus muscle and into the left internal obturator where there is a subtle abscess.   Original Report Authenticated By: Francene Boyers, M.D.   Ir Gj Tube Change  09/01/2012   *RADIOLOGY REPORT*  Clinical Data: Old in the dual lumen gastrojejunostomy catheter  GASTROSTOMY CATHETER REPLACEMENT  Comparison: 02/05/2012  Findings: The previously placed gastrojejunostomy catheter and surrounding skin were prepped and draped in usual sterile fashion. Contrast could not be injected through the jejunal lumen. Fluoroscopic inspection demonstrated the tip of the catheter in the region of the ligament of Treitz.  A glide wire would not pass beyond the distal aspect of the catheter, which appeared to be  in a true knot. For this reason, the gastrojejunostomy   catheter was cut and partially withdrawn into a 12-French peel-away sheath, which was then advanced over the catheter into the proximal duodenum.  A parallel glide wire was advanced.  The peel-away sheath and old gastrojejunostomy catheter were then removed, confirming the true knot in the distal aspect of the jejunal limb.  A new 20-French dual lumen gastrojejunostomy catheter was then advanced, positioned with the distal end of the jejunal limb in the proximal jejunum, and  the retention  balloon was inflated in the gastric lumen with 10 ml sterile saline. Injection of both the gastric and jejunal limb with contrast under fluoroscopy demonstrated appropriate positioning and patency. The patient tolerated the procedure well.  No immediate complication.  Fluoroscopy time:  4 minutes 24 seconds  IMPRESSION:  Technically successful exchange of 20-French dual lumen gastrojejunostomy catheter.   Original Report Authenticated By: D. Andria Rhein, MD   Dg Chest Port 1 View  09/13/2012   *RADIOLOGY REPORT*  Clinical Data: Urinary tract infection.  History of hypertension and diabetes.  PORTABLE CHEST - 1 VIEW  Comparison: 05/06/2012.  Findings: 1536 hours.  Right subclavian pacemaker leads are unchanged within the right atrium and right ventricle.  The heart size and mediastinal contours are stable with aortic atherosclerosis.  The overall pulmonary aeration has improved. There is no edema, confluent airspace opacity or significant pleural effusion.  The patient is status post lower cervical thoracic fusion.  IMPRESSION: No acute cardiopulmonary process.   Original Report Authenticated By: Carey Bullocks, M.D.  Results for orders placed during the hospital encounter of 09/13/12  URINE CULTURE      Result Value Range   Specimen Description URINE, CATHETERIZED     Special Requests NONE     Culture  Setup Time 09/13/2012 19:48     Colony Count >=100,000 COLONIES/ML     Culture       Value: Multiple bacterial morphotypes present, none predominant. Suggest appropriate recollection if clinically indicated.   Report Status 09/14/2012 FINAL    CULTURE, BLOOD (ROUTINE X 2)      Result Value Range   Specimen Description BLOOD RIGHT HAND     Special Requests BOTTLES DRAWN AEROBIC ONLY 3CC     Culture  Setup Time 09/13/2012 19:45     Culture NO GROWTH 5 DAYS     Report Status 09/19/2012 FINAL    CULTURE, BLOOD (ROUTINE X 2)      Result Value Range   Specimen Description BLOOD RIGHT HAND     Special  Requests BOTTLES DRAWN AEROBIC AND ANAEROBIC 5CC     Culture  Setup Time 09/13/2012 19:45     Culture       Value: KLEBSIELLA PNEUMONIAE     Note: Confirmed Extended Spectrum Beta-Lactamase Producer (ESBL) CRITICAL RESULT CALLED TO, READ BACK BY AND VERIFIED WITH: CYNTHIA WHITE 09/16/12 0950 BY SMITHERSJ     Note: Gram Stain Report Called to,Read Back By and Verified With: Gertie Gowda 09/14/12 @ 1:52PM BY RUSCA.   Report Status 09/16/2012 FINAL     Organism ID, Bacteria KLEBSIELLA PNEUMONIAE    URINE CULTURE      Result Value Range   Specimen Description URINE, CATHETERIZED     Special Requests NONE     Culture  Setup Time 09/18/2012 01:14     Colony Count NO GROWTH     Culture NO GROWTH     Report Status 09/19/2012 FINAL    CULTURE, BLOOD (  ROUTINE X 2)      Result Value Range   Specimen Description BLOOD LEFT HAND     Special Requests BOTTLES DRAWN AEROBIC ONLY 3CC     Culture  Setup Time 09/18/2012 00:16     Culture       Value:        BLOOD CULTURE RECEIVED NO GROWTH TO DATE CULTURE WILL BE HELD FOR 5 DAYS BEFORE ISSUING A FINAL NEGATIVE REPORT   Report Status PENDING    CULTURE, BLOOD (ROUTINE X 2)      Result Value Range   Specimen Description BLOOD RIGHT HAND     Special Requests BOTTLES DRAWN AEROBIC ONLY 3CC     Culture  Setup Time 09/18/2012 00:16     Culture       Value:        BLOOD CULTURE RECEIVED NO GROWTH TO DATE CULTURE WILL BE HELD FOR 5 DAYS BEFORE ISSUING A FINAL NEGATIVE REPORT   Report Status PENDING    CBC WITH DIFFERENTIAL      Result Value Range   WBC 21.7 (*) 4.0 - 10.5 K/uL   RBC 4.29  4.22 - 5.81 MIL/uL   Hemoglobin 10.9 (*) 13.0 - 17.0 g/dL   HCT 21.3 (*) 08.6 - 57.8 %   MCV 77.6 (*) 78.0 - 100.0 fL   MCH 25.4 (*) 26.0 - 34.0 pg   MCHC 32.7  30.0 - 36.0 g/dL   RDW 46.9 (*) 62.9 - 52.8 %   Platelets 351  150 - 400 K/uL   Neutrophils Relative % 83 (*) 43 - 77 %   Neutro Abs 17.9 (*) 1.7 - 7.7 K/uL   Lymphocytes Relative 8 (*) 12 - 46 %   Lymphs  Abs 1.7  0.7 - 4.0 K/uL   Monocytes Relative 9  3 - 12 %   Monocytes Absolute 2.0 (*) 0.1 - 1.0 K/uL   Eosinophils Relative 0  0 - 5 %   Eosinophils Absolute 0.0  0.0 - 0.7 K/uL   Basophils Relative 0  0 - 1 %   Basophils Absolute 0.1  0.0 - 0.1 K/uL  COMPREHENSIVE METABOLIC PANEL      Result Value Range   Sodium 133 (*) 135 - 145 mEq/L   Potassium 5.1  3.5 - 5.1 mEq/L   Chloride 99  96 - 112 mEq/L   CO2 24  19 - 32 mEq/L   Glucose, Bld 193 (*) 70 - 99 mg/dL   BUN 43 (*) 6 - 23 mg/dL   Creatinine, Ser 4.13 (*) 0.50 - 1.35 mg/dL   Calcium 9.3  8.4 - 24.4 mg/dL   Total Protein 8.6 (*) 6.0 - 8.3 g/dL   Albumin 2.1 (*) 3.5 - 5.2 g/dL   AST 18  0 - 37 U/L   ALT 10  0 - 53 U/L   Alkaline Phosphatase 83  39 - 117 U/L   Total Bilirubin 0.3  0.3 - 1.2 mg/dL   GFR calc non Af Amer 38 (*) >90 mL/min   GFR calc Af Amer 44 (*) >90 mL/min  LIPASE, BLOOD      Result Value Range   Lipase 28  11 - 59 U/L  URINALYSIS, ROUTINE W REFLEX MICROSCOPIC      Result Value Range   Color, Urine AMBER (*) YELLOW   APPearance TURBID (*) CLEAR   Specific Gravity, Urine 1.020  1.005 - 1.030   pH 5.5  5.0 - 8.0   Glucose,  UA NEGATIVE  NEGATIVE mg/dL   Hgb urine dipstick LARGE (*) NEGATIVE   Bilirubin Urine NEGATIVE  NEGATIVE   Ketones, ur NEGATIVE  NEGATIVE mg/dL   Protein, ur 30 (*) NEGATIVE mg/dL   Urobilinogen, UA 1.0  0.0 - 1.0 mg/dL   Nitrite NEGATIVE  NEGATIVE   Leukocytes, UA LARGE (*) NEGATIVE  PRO B NATRIURETIC PEPTIDE      Result Value Range   Pro B Natriuretic peptide (BNP) 2027.0 (*) 0 - 450 pg/mL  URINE MICROSCOPIC-ADD ON      Result Value Range   WBC, UA TOO NUMEROUS TO COUNT  <3 WBC/hpf   RBC / HPF TOO NUMEROUS TO COUNT  <3 RBC/hpf   Bacteria, UA MANY (*) RARE  CBC      Result Value Range   WBC 11.1 (*) 4.0 - 10.5 K/uL   RBC 4.10 (*) 4.22 - 5.81 MIL/uL   Hemoglobin 10.1 (*) 13.0 - 17.0 g/dL   HCT 14.7 (*) 82.9 - 56.2 %   MCV 77.1 (*) 78.0 - 100.0 fL   MCH 24.6 (*) 26.0 - 34.0  pg   MCHC 32.0  30.0 - 36.0 g/dL   RDW 13.0 (*) 86.5 - 78.4 %   Platelets 324  150 - 400 K/uL  COMPREHENSIVE METABOLIC PANEL      Result Value Range   Sodium 134 (*) 135 - 145 mEq/L   Potassium 4.6  3.5 - 5.1 mEq/L   Chloride 101  96 - 112 mEq/L   CO2 23  19 - 32 mEq/L   Glucose, Bld 174 (*) 70 - 99 mg/dL   BUN 42 (*) 6 - 23 mg/dL   Creatinine, Ser 6.96 (*) 0.50 - 1.35 mg/dL   Calcium 9.1  8.4 - 29.5 mg/dL   Total Protein 8.3  6.0 - 8.3 g/dL   Albumin 2.2 (*) 3.5 - 5.2 g/dL   AST 20  0 - 37 U/L   ALT 11  0 - 53 U/L   Alkaline Phosphatase 80  39 - 117 U/L   Total Bilirubin 0.3  0.3 - 1.2 mg/dL   GFR calc non Af Amer 46 (*) >90 mL/min   GFR calc Af Amer 53 (*) >90 mL/min  GLUCOSE, CAPILLARY      Result Value Range   Glucose-Capillary 150 (*) 70 - 99 mg/dL   Comment 1 Notify RN    GLUCOSE, CAPILLARY      Result Value Range   Glucose-Capillary 157 (*) 70 - 99 mg/dL   Comment 1 Notify RN    GLUCOSE, CAPILLARY      Result Value Range   Glucose-Capillary 152 (*) 70 - 99 mg/dL  GLUCOSE, CAPILLARY      Result Value Range   Glucose-Capillary 135 (*) 70 - 99 mg/dL  GLUCOSE, CAPILLARY      Result Value Range   Glucose-Capillary 121 (*) 70 - 99 mg/dL  GLUCOSE, CAPILLARY      Result Value Range   Glucose-Capillary 114 (*) 70 - 99 mg/dL   Comment 1 Notify RN    GLUCOSE, CAPILLARY      Result Value Range   Glucose-Capillary 114 (*) 70 - 99 mg/dL   Comment 1 Notify RN    GLUCOSE, CAPILLARY      Result Value Range   Glucose-Capillary 100 (*) 70 - 99 mg/dL   Comment 1 Notify RN    GLUCOSE, CAPILLARY      Result Value Range   Glucose-Capillary 100 (*) 70 -  99 mg/dL   Comment 1 Notify RN    GLUCOSE, CAPILLARY      Result Value Range   Glucose-Capillary 97  70 - 99 mg/dL   Comment 1 Notify RN    GLUCOSE, CAPILLARY      Result Value Range   Glucose-Capillary 89  70 - 99 mg/dL  GLUCOSE, CAPILLARY      Result Value Range   Glucose-Capillary 107 (*) 70 - 99 mg/dL  GLUCOSE,  CAPILLARY      Result Value Range   Glucose-Capillary 96  70 - 99 mg/dL  GLUCOSE, CAPILLARY      Result Value Range   Glucose-Capillary 86  70 - 99 mg/dL  GLUCOSE, CAPILLARY      Result Value Range   Glucose-Capillary 60 (*) 70 - 99 mg/dL   Comment 1 Notify RN    GLUCOSE, CAPILLARY      Result Value Range   Glucose-Capillary 63 (*) 70 - 99 mg/dL   Comment 1 Notify RN    GLUCOSE, CAPILLARY      Result Value Range   Glucose-Capillary 120 (*) 70 - 99 mg/dL   Comment 1 Notify RN    BASIC METABOLIC PANEL      Result Value Range   Sodium 141  135 - 145 mEq/L   Potassium 3.4 (*) 3.5 - 5.1 mEq/L   Chloride 109  96 - 112 mEq/L   CO2 23  19 - 32 mEq/L   Glucose, Bld 97  70 - 99 mg/dL   BUN 25 (*) 6 - 23 mg/dL   Creatinine, Ser 1.61  0.50 - 1.35 mg/dL   Calcium 8.9  8.4 - 09.6 mg/dL   GFR calc non Af Amer 62 (*) >90 mL/min   GFR calc Af Amer 72 (*) >90 mL/min  GLUCOSE, CAPILLARY      Result Value Range   Glucose-Capillary 70  70 - 99 mg/dL  GLUCOSE, CAPILLARY      Result Value Range   Glucose-Capillary 130 (*) 70 - 99 mg/dL   Comment 1 Notify RN    GLUCOSE, CAPILLARY      Result Value Range   Glucose-Capillary 92  70 - 99 mg/dL   Comment 1 Notify RN    GLUCOSE, CAPILLARY      Result Value Range   Glucose-Capillary 93  70 - 99 mg/dL   Comment 1 Notify RN    GLUCOSE, CAPILLARY      Result Value Range   Glucose-Capillary 94  70 - 99 mg/dL  GLUCOSE, CAPILLARY      Result Value Range   Glucose-Capillary 108 (*) 70 - 99 mg/dL   Comment 1 Documented in Chart     Comment 2 Notify RN    URINALYSIS, ROUTINE W REFLEX MICROSCOPIC      Result Value Range   Color, Urine YELLOW  YELLOW   APPearance CLEAR  CLEAR   Specific Gravity, Urine 1.014  1.005 - 1.030   pH 8.5 (*) 5.0 - 8.0   Glucose, UA NEGATIVE  NEGATIVE mg/dL   Hgb urine dipstick NEGATIVE  NEGATIVE   Bilirubin Urine NEGATIVE  NEGATIVE   Ketones, ur NEGATIVE  NEGATIVE mg/dL   Protein, ur NEGATIVE  NEGATIVE mg/dL    Urobilinogen, UA 0.2  0.0 - 1.0 mg/dL   Nitrite NEGATIVE  NEGATIVE   Leukocytes, UA SMALL (*) NEGATIVE  GLUCOSE, CAPILLARY      Result Value Range   Glucose-Capillary 113 (*) 70 - 99 mg/dL  Comment 1 Notify RN    URINE MICROSCOPIC-ADD ON      Result Value Range   Squamous Epithelial / LPF RARE  RARE   WBC, UA 3-6  <3 WBC/hpf   RBC / HPF 0-2  <3 RBC/hpf   Bacteria, UA RARE  RARE  CBC      Result Value Range   WBC 4.1  4.0 - 10.5 K/uL   RBC 3.91 (*) 4.22 - 5.81 MIL/uL   Hemoglobin 9.2 (*) 13.0 - 17.0 g/dL   HCT 19.1 (*) 47.8 - 29.5 %   MCV 76.7 (*) 78.0 - 100.0 fL   MCH 23.5 (*) 26.0 - 34.0 pg   MCHC 30.7  30.0 - 36.0 g/dL   RDW 62.1 (*) 30.8 - 65.7 %   Platelets 303  150 - 400 K/uL  BASIC METABOLIC PANEL      Result Value Range   Sodium 140  135 - 145 mEq/L   Potassium 3.3 (*) 3.5 - 5.1 mEq/L   Chloride 108  96 - 112 mEq/L   CO2 21  19 - 32 mEq/L   Glucose, Bld 118 (*) 70 - 99 mg/dL   BUN 24 (*) 6 - 23 mg/dL   Creatinine, Ser 8.46  0.50 - 1.35 mg/dL   Calcium 9.2  8.4 - 96.2 mg/dL   GFR calc non Af Amer 84 (*) >90 mL/min   GFR calc Af Amer >90  >90 mL/min  HIV 1 RNA QUANT-NO REFLEX-BLD      Result Value Range   HIV 1 RNA Quant <20  <20 copies/mL   HIV1 RNA Quant, Log <1.30  <1.30 log 10  RPR      Result Value Range   RPR NON REACTIVE  NON REACTIVE  SEDIMENTATION RATE      Result Value Range   Sed Rate >140 (*) 0 - 16 mm/hr  C-REACTIVE PROTEIN      Result Value Range   CRP 5.2 (*) <0.60 mg/dL  HEPATITIS C ANTIBODY (REFLEX)      Result Value Range   HCV Ab NEGATIVE  NEGATIVE  GLUCOSE, CAPILLARY      Result Value Range   Glucose-Capillary 109 (*) 70 - 99 mg/dL  GLUCOSE, CAPILLARY      Result Value Range   Glucose-Capillary 104 (*) 70 - 99 mg/dL  GLUCOSE, CAPILLARY      Result Value Range   Glucose-Capillary 100 (*) 70 - 99 mg/dL  GLUCOSE, CAPILLARY      Result Value Range   Glucose-Capillary 101 (*) 70 - 99 mg/dL  GLUCOSE, CAPILLARY      Result Value  Range   Glucose-Capillary 101 (*) 70 - 99 mg/dL  GLUCOSE, CAPILLARY      Result Value Range   Glucose-Capillary 95  70 - 99 mg/dL  GLUCOSE, CAPILLARY      Result Value Range   Glucose-Capillary 98  70 - 99 mg/dL   Comment 1 Notify RN    GLUCOSE, CAPILLARY      Result Value Range   Glucose-Capillary 111 (*) 70 - 99 mg/dL  GLUCOSE, CAPILLARY      Result Value Range   Glucose-Capillary 131 (*) 70 - 99 mg/dL   Comment 1 Notify RN    GLUCOSE, CAPILLARY      Result Value Range   Glucose-Capillary 102 (*) 70 - 99 mg/dL   Comment 1 Notify RN    GLUCOSE, CAPILLARY      Result Value Range   Glucose-Capillary  101 (*) 70 - 99 mg/dL  GLUCOSE, CAPILLARY      Result Value Range   Glucose-Capillary 127 (*) 70 - 99 mg/dL  GLUCOSE, CAPILLARY      Result Value Range   Glucose-Capillary 106 (*) 70 - 99 mg/dL  CBC WITH DIFFERENTIAL      Result Value Range   WBC 5.2  4.0 - 10.5 K/uL   RBC 3.84 (*) 4.22 - 5.81 MIL/uL   Hemoglobin 9.7 (*) 13.0 - 17.0 g/dL   HCT 16.1 (*) 09.6 - 04.5 %   MCV 78.4  78.0 - 100.0 fL   MCH 25.3 (*) 26.0 - 34.0 pg   MCHC 32.2  30.0 - 36.0 g/dL   RDW 40.9 (*) 81.1 - 91.4 %   Platelets 325  150 - 400 K/uL   Neutrophils Relative % 45  43 - 77 %   Neutro Abs 2.3  1.7 - 7.7 K/uL   Lymphocytes Relative 40  12 - 46 %   Lymphs Abs 2.1  0.7 - 4.0 K/uL   Monocytes Relative 8  3 - 12 %   Monocytes Absolute 0.4  0.1 - 1.0 K/uL   Eosinophils Relative 5  0 - 5 %   Eosinophils Absolute 0.3  0.0 - 0.7 K/uL   Basophils Relative 2 (*) 0 - 1 %   Basophils Absolute 0.1  0.0 - 0.1 K/uL  BASIC METABOLIC PANEL      Result Value Range   Sodium 140  135 - 145 mEq/L   Potassium 4.1  3.5 - 5.1 mEq/L   Chloride 109  96 - 112 mEq/L   CO2 22  19 - 32 mEq/L   Glucose, Bld 105 (*) 70 - 99 mg/dL   BUN 26 (*) 6 - 23 mg/dL   Creatinine, Ser 7.82  0.50 - 1.35 mg/dL   Calcium 9.2  8.4 - 95.6 mg/dL   GFR calc non Af Amer 82 (*) >90 mL/min   GFR calc Af Amer >90  >90 mL/min  MAGNESIUM       Result Value Range   Magnesium 2.3  1.5 - 2.5 mg/dL  PROTIME-INR      Result Value Range   Prothrombin Time 14.3  11.6 - 15.2 seconds   INR 1.13  0.00 - 1.49  APTT      Result Value Range   aPTT 33  24 - 37 seconds  GLUCOSE, CAPILLARY      Result Value Range   Glucose-Capillary 87  70 - 99 mg/dL  GLUCOSE, CAPILLARY      Result Value Range   Glucose-Capillary 103 (*) 70 - 99 mg/dL  GLUCOSE, CAPILLARY      Result Value Range   Glucose-Capillary 119 (*) 70 - 99 mg/dL  GLUCOSE, CAPILLARY      Result Value Range   Glucose-Capillary 88  70 - 99 mg/dL  GLUCOSE, CAPILLARY      Result Value Range   Glucose-Capillary 86  70 - 99 mg/dL  GLUCOSE, CAPILLARY      Result Value Range   Glucose-Capillary 87  70 - 99 mg/dL  CG4 I-STAT (LACTIC ACID)      Result Value Range   Lactic Acid, Venous 2.20  0.5 - 2.2 mmol/L  CG4 I-STAT (LACTIC ACID)      Result Value Range   Lactic Acid, Venous 2.08  0.5 - 2.2 mmol/L   A/P: Pt with klebsiella bacteremia of unknown origin, stage 4 coccygeal decubitus ulcer, prior enterococcal endocarditis  and chronic left SI joint osteomyelitis with spread into left iliacus muscle and left internal obturator. Plan is for CT guided left iliac bone biopsy for culture on 6/9. Details/risks of procedure d/w pt/pt's wife, [name], with their understanding and consent.

## 2012-09-21 NOTE — Progress Notes (Signed)
Peripherally Inserted Central Catheter/Midline Placement  Attempted PICC placement x 2, Pt unable to tolerate abduction of either arm to a point where upper arm veins can be easly visualized and accessed with US guidance. After accessing Left brachial and basilic veins I was unable to advance guidewire to axilla. Recommend PICC placement by IR. Pt verbalized understanding and wishes to have radiology attempt PICC and stated that Radiology has had to place PICC in the past.  The IV Nurse has discussed with the patient and/or persons authorized to consent for the patient, the purpose of this procedure and the potential benefits and risks involved with this procedure.  The benefits include less needle sticks, lab draws from the catheter and patient may be discharged home with the catheter.  Risks include, but not limited to, infection, bleeding, blood clot (thrombus formation), and puncture of an artery; nerve damage and irregular heat beat.  Alternatives to this procedure were also discussed.  PICC/Midline Placement Documentation        Geoffery Spruce 09/21/2012, 7:44 PM

## 2012-09-22 ENCOUNTER — Inpatient Hospital Stay (HOSPITAL_COMMUNITY): Payer: Medicare Other

## 2012-09-22 LAB — CBC
Hemoglobin: 9.8 g/dL — ABNORMAL LOW (ref 13.0–17.0)
MCH: 24.6 pg — ABNORMAL LOW (ref 26.0–34.0)
MCV: 78.2 fL (ref 78.0–100.0)
RBC: 3.99 MIL/uL — ABNORMAL LOW (ref 4.22–5.81)

## 2012-09-22 LAB — GLUCOSE, CAPILLARY
Glucose-Capillary: 87 mg/dL (ref 70–99)
Glucose-Capillary: 95 mg/dL (ref 70–99)
Glucose-Capillary: 124 mg/dL — ABNORMAL HIGH (ref 70–99)
Glucose-Capillary: 135 mg/dL — ABNORMAL HIGH (ref 70–99)

## 2012-09-22 LAB — BASIC METABOLIC PANEL
CO2: 20 mEq/L (ref 19–32)
Calcium: 9.2 mg/dL (ref 8.4–10.5)
Creatinine, Ser: 0.7 mg/dL (ref 0.50–1.35)
Glucose, Bld: 92 mg/dL (ref 70–99)

## 2012-09-22 MED ORDER — FENTANYL CITRATE 0.05 MG/ML IJ SOLN
INTRAMUSCULAR | Status: AC | PRN
  Administered 2012-09-22 (×2): 50 ug via INTRAVENOUS

## 2012-09-22 MED ORDER — IOHEXOL 300 MG/ML  SOLN
25.0000 mL | Freq: Once | INTRAMUSCULAR | Status: AC | PRN
  Administered 2012-09-22: 15 mL via INTRAVENOUS

## 2012-09-22 MED ORDER — VANCOMYCIN HCL IN DEXTROSE 1-5 GM/200ML-% IV SOLN
1000.0000 mg | Freq: Three times a day (TID) | INTRAVENOUS | Status: DC
Start: 2012-09-22 — End: 2012-09-23
  Administered 2012-09-22 – 2012-09-23 (×4): 1000 mg via INTRAVENOUS
  Filled 2012-09-22 (×5): qty 200

## 2012-09-22 MED ORDER — LIDOCAINE HCL 1 % IJ SOLN
INTRAMUSCULAR | Status: AC
  Filled 2012-09-22: qty 20

## 2012-09-22 MED ORDER — MIDAZOLAM HCL 2 MG/2ML IJ SOLN
INTRAMUSCULAR | Status: AC | PRN
  Administered 2012-09-22 (×2): 1 mg via INTRAVENOUS

## 2012-09-22 NOTE — Progress Notes (Signed)
SLP Cancellation Note  Patient Details Name: JEBEDIAH MACRAE MRN: [id number] DOB: 12-Oct-1935   Cancelled treatment:       Reason Eval/Treat Not Completed: Patient at procedure or test/unavailable;Other (comment) (pt npo currently, ? for clogged PEG tube intervention)  Donavan Burnet, MS [facility] SLP [phone]

## 2012-09-22 NOTE — Procedures (Signed)
Successful placement of left brachial vein approach dual lumen mid line PICC line with tip terminating within the left axilla.  The PICC line is ready for immediate use.

## 2012-09-22 NOTE — Procedures (Signed)
Technically successful CT guided attempted aspiration of L pelvic side wall/obturator fluid collection. Small sample sent to lab for analysis. No immediate post procedural complication.

## 2012-09-22 NOTE — Procedures (Signed)
Successful declogging of mechanically obstructed jejunostomy limb of G-J tube. Both G and J limbs widely patent and flush easily. No complications

## 2012-09-22 NOTE — Progress Notes (Signed)
ANTIBIOTIC CONSULT NOTE - INITIAL  Pharmacy Consult for vancomycin Indication: Adding to Invanz per ID rec's for obturator abscess  No Known Allergies  Patient Measurements: Height: 6\' 2"  (188 cm) Weight: 207 lb 0.2 oz (93.9 kg) IBW/kg (Calculated) : 82.2  Vital Signs: Temp: 98.8 F (37.1 C) (06/09 0646) Temp src: Oral (06/09 0646) BP: 145/82 mmHg (06/09 0646) Pulse Rate: 71 (06/09 0646) Intake/Output from previous day: 06/08 0701 - 06/09 0700 In: 300 [I.V.:300] Out: 3050 [Urine:2450; Stool:600] Intake/Output from this shift:    Labs:  Recent Labs  09/20/12 0801 09/22/12 0435  WBC 5.2 5.9  HGB 9.7* 9.8*  PLT 325 347  CREATININE 0.89 0.70   Estimated Creatinine Clearance: 92.8 ml/min (by C-G formula based on Cr of 0.7). No results found for this basename: VANCOTROUGH, VANCOPEAK, VANCORANDOM, GENTTROUGH, GENTPEAK, GENTRANDOM, TOBRATROUGH, TOBRAPEAK, TOBRARND, AMIKACINPEAK, AMIKACINTROU, AMIKACIN,  in the last 72 hours   Microbiology: Recent Results (from the past 720 hour(s))  CULTURE, BLOOD (ROUTINE X 2)     Status: None   Collection Time    09/13/12  3:56 PM      Result Value Range Status   Specimen Description BLOOD RIGHT HAND   Final   Special Requests BOTTLES DRAWN AEROBIC AND ANAEROBIC 5CC   Final   Culture  Setup Time 09/13/2012 19:45   Final   Culture     Final   Value: KLEBSIELLA PNEUMONIAE     Note: Confirmed Extended Spectrum Beta-Lactamase Producer (ESBL) CRITICAL RESULT CALLED TO, READ BACK BY AND VERIFIED WITH: CYNTHIA WHITE 09/16/12 0950 BY SMITHERSJ     Note: Gram Stain Report Called to,Read Back By and Verified With: Gertie Gowda 09/14/12 @ 1:52PM BY RUSCA.   Report Status 09/16/2012 FINAL   Final   Organism ID, Bacteria KLEBSIELLA PNEUMONIAE   Final  CULTURE, BLOOD (ROUTINE X 2)     Status: None   Collection Time    09/13/12  4:41 PM      Result Value Range Status   Specimen Description BLOOD RIGHT HAND   Final   Special Requests BOTTLES DRAWN  AEROBIC ONLY 3CC   Final   Culture  Setup Time 09/13/2012 19:45   Final   Culture NO GROWTH 5 DAYS   Final   Report Status 09/19/2012 FINAL   Final  URINE CULTURE     Status: None   Collection Time    09/13/12  6:02 PM      Result Value Range Status   Specimen Description URINE, CATHETERIZED   Final   Special Requests NONE   Final   Culture  Setup Time 09/13/2012 19:48   Final   Colony Count >=100,000 COLONIES/ML   Final   Culture     Final   Value: Multiple bacterial morphotypes present, none predominant. Suggest appropriate recollection if clinically indicated.   Report Status 09/14/2012 FINAL   Final  URINE CULTURE     Status: None   Collection Time    09/17/12  3:19 PM      Result Value Range Status   Specimen Description URINE, CATHETERIZED   Final   Special Requests NONE   Final   Culture  Setup Time 09/18/2012 01:14   Final   Colony Count NO GROWTH   Final   Culture NO GROWTH   Final   Report Status 09/19/2012 FINAL   Final  CULTURE, BLOOD (ROUTINE X 2)     Status: None   Collection Time    09/17/12  4:15  PM      Result Value Range Status   Specimen Description BLOOD RIGHT HAND   Final   Special Requests BOTTLES DRAWN AEROBIC ONLY 3CC   Final   Culture  Setup Time 09/18/2012 00:16   Final   Culture     Final   Value:        BLOOD CULTURE RECEIVED NO GROWTH TO DATE CULTURE WILL BE HELD FOR 5 DAYS BEFORE ISSUING A FINAL NEGATIVE REPORT   Report Status PENDING   Incomplete  CULTURE, BLOOD (ROUTINE X 2)     Status: None   Collection Time    09/17/12  4:24 PM      Result Value Range Status   Specimen Description BLOOD LEFT HAND   Final   Special Requests BOTTLES DRAWN AEROBIC ONLY 3CC   Final   Culture  Setup Time 09/18/2012 00:16   Final   Culture     Final   Value:        BLOOD CULTURE RECEIVED NO GROWTH TO DATE CULTURE WILL BE HELD FOR 5 DAYS BEFORE ISSUING A FINAL NEGATIVE REPORT   Report Status PENDING   Incomplete    Medical History: Past Medical History   Diagnosis Date  . Stroke     Hx of with right hemiparesis  . Hypertension   . Endocarditis, mitral valve, syphilitic   . Hypothyroidism   . Anorexia   . Dysphagia   . Reflux   . Bacteremia   . Clostridium difficile colitis   . CVA (cerebral vascular accident)   . Pressure ulcer   . Diabetes mellitus, type 2   . Anemia   . Stroke     Medications:  Scheduled:  . citalopram  10 mg Oral q morning - 10a  . enoxaparin (LOVENOX) injection  40 mg Subcutaneous QHS  . ertapenem  1 g Intravenous Q24H  . feeding supplement (GLUCERNA 1.2 CAL)  1,000 mL Per Tube Q24H  . feeding supplement (GLUCERNA 1.2 CAL)  237 mL Per Tube TID PC  . feeding supplement  30 mL Per Tube TID  . ferrous sulfate  300 mg Per Tube BID  . free water  60 mL Per Tube Q6H  . gabapentin  300 mg Per Tube QHS  . glycopyrrolate  1 mg Oral TID  . insulin aspart  0-9 Units Subcutaneous Q8H  . insulin glargine  20 Units Subcutaneous QHS  . levothyroxine  125 mcg Oral QAC breakfast  . liothyronine  25 mcg Oral q morning - 10a  . metoprolol tartrate  25 mg Per Tube BID  . multivitamin  5 mL Per Tube Daily  . scopolamine  1 patch Transdermal Q72H  . traZODone  25 mg Oral QHS  . vitamin C  500 mg Per Tube BID   Infusions:  . sodium chloride 20 mL/hr at 09/21/12 1900   Assessment: Patient known to pharmacy for vanc/Zosyn previously that was changed to Invanz (now Day #6). To add vancomycin back to regimen per ID recommendations for obturator abscess. Plan to get cx and ID to adjust abx's as outpatient. 6 weeks total abx's will be given per ID  Labs now stable - N CrCl 92  afebrile  Goal of Therapy:  Vancomycin trough level 15-20 mcg/ml  Plan:  1) Vancomycin 1g IV q8 2) Vanc trough at steady state 3) Follow renal function   Hessie Knows, PharmD, BCPS Pager [phone] 09/22/2012 9:46 AM

## 2012-09-22 NOTE — Progress Notes (Signed)
[facility]                                                                                Patient Demographics  [name], is a 77 y.o. male, DOB - May 25, 1935, ZOX:[id number], WJX:[id number]  Admit date - 09/13/2012  Admitting Physician Meredeth Ide, MD  Outpatient Primary MD for the patient is HASSELL Angelique Blonder, MD  LOS - 9   Chief Complaint  Patient presents with  . 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 Patient has PEG tube in place, chronic Foley catheter, colostomy bag in place. Patient has a large coccygeal decubitus ulcer which had been from time. Patient had a flap done a year ago and the ulcer failed to heal at this time as per the wife patient is not a candidate for further surgery. Colostomy was done to prevent soiling of the ulcer.  His workup so far is suggestive of Klebsiella bacteremia likely source is urine versus sacral osteo mellitus, ID and general surgery have been consulted. Continue antibiotics for now.     Assessment and Plan   1. Sepsis with Klebsiella bacteremia likely source urine versus chronic sacral ostium mellitus he also has a chronic indwelling Foley catheter. His stage IV coccygeal decubitus ulcer on CT has evidence of chronic sacral ostium mellitus with questionable small abscesses, he however also have  prosthetic mitral valve with previous history of endocarditis - ID guiding [place] with IV antibiotics, 2-D echogram appears stable, TEE was canceled by cardiology due to risk of aspiration, CT of the abdomen pelvis suggestive of likely sacral ostium mellitus with questionable small sacral abscess, general surgery has been consulted for the  same, prior to attempt aspiration if any fluid seen from the sacral site, if no fluid they will attempt bone biopsy from the sacral ostium site, repeat UA and culture along with repeat blood cultures have been ordered and negative so far,  His Foley catheter was changed during this admission. He will most likely require 6 weeks of IV antibiotics for his sacral ostieo mellitus, PICC line has been ordered, have requested IR to attempt bone biopsy versus aspiration of his questionable small abscesses in the sacral area (per IR likely myositis and not fluid collection) which will be done 09-22-12.    2. Acute renal insufficiency. Due to sepsis from #1, resolved after IV fluids. IV fluids have been stopped.    3. Diabetes mellitus type 2. Continue on Lantus and sliding-scale insulin at reduced dose and frequency  Lab Results  Component Value Date   HGBA1C 8.7* 04/13/2012    CBG (last 3)   Recent Labs  09/21/12 1653 09/21/12 2208 09/22/12 0132  GLUCAP 106* 103* 95     4. 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 I have consulted Gen. surgery also. IR to attempt bone biopsy on 09/22/2012.   5. H/O CVA with right-sided hemiparesis which is chronic, chronic bedbound status, chronic Foley catheter, PEG . - Continue supportive care, feeding, meds and free water through PEG tube, outpatient followup with PCP post discharge. Per wife he does eat by mouth also will have PT evaluate and address diet.   6. Hypothyroidism continue home dose Synthroid.   7. Malnutrition of moderate degree - continue nutritional supplementation. Each has evaluated and he's taking dysphagia 3 diet orally along with his PEG tube feeds. Will have outpatient nutritional consultation for calorie intake and to see if PEG tube still needs to be used. PEG tube is  clogged and will request IR to assist and declotting both G. and J. ports are clogged.     Code Status: full  Family Communication: wife  Disposition Plan: SNF   Procedures TTE, TEE was canceled by cardiology, CT Abd and Pelvis, PICC line ordered , bone biopsy from sacral ostium mellitus scheduled for 09/22/2012 by IR   Consults  ID, W care, CCS, cardiology, IR   DVT Prophylaxis  Lovenox    Lab Results  Component Value Date   PLT 347 09/22/2012    Medications  Scheduled Meds: . citalopram  10 mg Oral q morning - 10a  . enoxaparin (LOVENOX) injection  40 mg Subcutaneous QHS  . ertapenem  1 g Intravenous Q24H  . feeding supplement (GLUCERNA 1.2 CAL)  1,000 mL Per Tube Q24H  . feeding supplement (GLUCERNA 1.2 CAL)  237 mL Per Tube TID PC  . feeding supplement  30 mL Per Tube TID  . ferrous sulfate  300 mg Per Tube BID  . free water  60 mL Per Tube Q6H  . gabapentin  300 mg Per Tube QHS  . glycopyrrolate  1 mg Oral TID  . insulin aspart  0-9 Units Subcutaneous Q8H  . insulin glargine  20 Units Subcutaneous QHS  . levothyroxine  125 mcg Oral QAC breakfast  . liothyronine  25 mcg Oral q morning - 10a  . metoprolol tartrate  25 mg Per Tube BID  . multivitamin  5 mL Per Tube Daily  . scopolamine  1 patch Transdermal Q72H  . traZODone  25 mg Oral QHS  . vancomycin  1,000 mg Intravenous Q8H  . vitamin C  500 mg Per Tube BID   Continuous Infusions: . sodium chloride 20 mL/hr at 09/21/12 1900   PRN Meds:.acetaminophen (TYLENOL) oral liquid 160 mg/5 mL, diphenhydrAMINE, traMADol  Antibiotics     Anti-infectives   Start     Dose/Rate Route Frequency Ordered Stop   09/22/12 1200  vancomycin (VANCOCIN) IVPB 1000 mg/200 mL premix     1,000 mg 200 mL/hr over 60 Minutes Intravenous Every 8 hours 09/22/12 0947     09/17/12 2300  ertapenem (INVANZ) 1 g in sodium chloride 0.9 % 50 mL IVPB     1 g 100 mL/hr over 30 Minutes Intravenous Every 24 hours 09/17/12 1546     09/16/12  1400  imipenem-cilastatin (PRIMAXIN) 500 mg in sodium chloride 0.9 % 100 mL IVPB  Status:  Discontinued     500 mg 200 mL/hr over 30 Minutes Intravenous 3 times per day 09/16/12 1020 09/17/12 1546   09/14/12 2000  vancomycin (VANCOCIN) 1,250 mg in sodium chloride 0.9 % 250 mL IVPB  Status:  Discontinued     1,250 mg 166.7 mL/hr over  90 Minutes Intravenous Every 24 hours 09/14/12 0237 09/16/12 0942   09/14/12 1000  piperacillin-tazobactam (ZOSYN) IVPB 3.375 g  Status:  Discontinued     3.375 g 12.5 mL/hr over 240 Minutes Intravenous Every 8 hours 09/14/12 0906 09/16/12 1020   09/13/12 2230  vancomycin (VANCOCIN) IVPB 1000 mg/200 mL premix     1,000 mg 200 mL/hr over 60 Minutes Intravenous  Once 09/13/12 2229 09/14/12 0056   09/13/12 2230  piperacillin-tazobactam (ZOSYN) IVPB 3.375 g     3.375 g 100 mL/hr over 30 Minutes Intravenous  Once 09/13/12 2229 09/14/12 0135   09/13/12 1930  cefTRIAXone (ROCEPHIN) 1 g in dextrose 5 % 50 mL IVPB     1 g 100 mL/hr over 30 Minutes Intravenous  Once 09/13/12 1929 09/13/12 2035       Time Spent in minutes   45   SINGH,PRASHANT K M.D on 09/22/2012 at 9:59 AM  Between 7am to 7pm - Pager - [phone]  After 7pm go to [email or web address] - password [email or web address]  And look for the night coverage person covering for me after hours  [facility] Office  [phone]    Subjective:   Luan Maberry today has, No headache, No chest pain, No abdominal pain - No Nausea, No new weakness tingling or numbness, No Cough - SOB.   Objective:   Filed Vitals:   09/21/12 0540 09/21/12 1500 09/21/12 2210 09/22/12 0646  BP: 149/84 142/88 165/92 145/82  Pulse: 73 79 77 71  Temp: 98.1 F (36.7 C) 97.6 F (36.4 C) 98.1 F (36.7 C) 98.8 F (37.1 C)  TempSrc: Oral Oral Oral Oral  Resp: 18 18 20 18   Height:      Weight:      SpO2: 100% 99% 99% 96%    Wt Readings from Last 3 Encounters:  09/13/12 93.9 kg (207 lb 0.2 oz)  09/13/12 93.9 kg (207 lb 0.2 oz)   05/04/12 111 kg (244 lb 11.4 oz)     Intake/Output Summary (Last 24 hours) at 09/22/12 0959 Last data filed at 09/22/12 0646  Gross per 24 hour  Intake    300 ml  Output   2850 ml  Net  -2550 ml    Exam Awake Alert, Oriented X 3, No new F.N deficits, Normal affect, chronic right-sided hemiparesis from previous CVA [place].AT,PERRAL Supple Neck,No JVD, No cervical lymphadenopathy appriciated.  Symmetrical Chest wall movement, Good air movement bilaterally, CTAB RRR,No Gallops,Rubs or new Murmurs, No Parasternal Heave +ve B.Sounds, Abd Soft, Non tender, No organomegaly appriciated, No rebound - guarding or rigidity. Colostomy bag in place, chronic Foley No Cyanosis, Clubbing or edema, No new Rash or bruise  , stage IV coccygeal decubitus ulcer   Data Review   Micro Results Recent Results (from the past 240 hour(s))  CULTURE, BLOOD (ROUTINE X 2)     Status: None   Collection Time    09/13/12  3:56 PM      Result Value Range Status   Specimen Description BLOOD RIGHT HAND   Final   Special Requests BOTTLES DRAWN AEROBIC AND ANAEROBIC 5CC   Final   Culture  Setup Time 09/13/2012 19:45   Final   Culture     Final   Value: KLEBSIELLA PNEUMONIAE     Note: Confirmed Extended Spectrum Beta-Lactamase Producer (ESBL) CRITICAL RESULT CALLED TO, READ BACK BY AND VERIFIED WITH: CYNTHIA WHITE 09/16/12 0950 BY SMITHERSJ     Note: Gram Stain Report Called to,Read Back By and Verified With:  DIANE [facility] 09/14/12 @ 1:52PM BY RUSCA.   Report Status 09/16/2012 FINAL   Final   Organism ID, Bacteria KLEBSIELLA PNEUMONIAE   Final  CULTURE, BLOOD (ROUTINE X 2)     Status: None   Collection Time    09/13/12  4:41 PM      Result Value Range Status   Specimen Description BLOOD RIGHT HAND   Final   Special Requests BOTTLES DRAWN AEROBIC ONLY 3CC   Final   Culture  Setup Time 09/13/2012 19:45   Final   Culture NO GROWTH 5 DAYS   Final   Report Status 09/19/2012 FINAL   Final  URINE CULTURE     Status: None    Collection Time    09/13/12  6:02 PM      Result Value Range Status   Specimen Description URINE, CATHETERIZED   Final   Special Requests NONE   Final   Culture  Setup Time 09/13/2012 19:48   Final   Colony Count >=100,000 COLONIES/ML   Final   Culture     Final   Value: Multiple bacterial morphotypes present, none predominant. Suggest appropriate recollection if clinically indicated.   Report Status 09/14/2012 FINAL   Final  URINE CULTURE     Status: None   Collection Time    09/17/12  3:19 PM      Result Value Range Status   Specimen Description URINE, CATHETERIZED   Final   Special Requests NONE   Final   Culture  Setup Time 09/18/2012 01:14   Final   Colony Count NO GROWTH   Final   Culture NO GROWTH   Final   Report Status 09/19/2012 FINAL   Final  CULTURE, BLOOD (ROUTINE X 2)     Status: None   Collection Time    09/17/12  4:15 PM      Result Value Range Status   Specimen Description BLOOD RIGHT HAND   Final   Special Requests BOTTLES DRAWN AEROBIC ONLY 3CC   Final   Culture  Setup Time 09/18/2012 00:16   Final   Culture     Final   Value:        BLOOD CULTURE RECEIVED NO GROWTH TO DATE CULTURE WILL BE HELD FOR 5 DAYS BEFORE ISSUING A FINAL NEGATIVE REPORT   Report Status PENDING   Incomplete  CULTURE, BLOOD (ROUTINE X 2)     Status: None   Collection Time    09/17/12  4:24 PM      Result Value Range Status   Specimen Description BLOOD LEFT HAND   Final   Special Requests BOTTLES DRAWN AEROBIC ONLY 3CC   Final   Culture  Setup Time 09/18/2012 00:16   Final   Culture     Final   Value:        BLOOD CULTURE RECEIVED NO GROWTH TO DATE CULTURE WILL BE HELD FOR 5 DAYS BEFORE ISSUING A FINAL NEGATIVE REPORT   Report Status PENDING   Incomplete    Radiology Reports Ir Gj Tube Change  09/01/2012   *RADIOLOGY REPORT*  Clinical Data: Old in the dual lumen gastrojejunostomy catheter  GASTROSTOMY CATHETER REPLACEMENT  Comparison: 02/05/2012  Findings: The previously placed  gastrojejunostomy catheter and surrounding skin were prepped and draped in usual sterile fashion. Contrast could not be injected through the jejunal lumen. Fluoroscopic inspection demonstrated the tip of the catheter in the region of the ligament of Treitz.  A glide wire would not pass beyond the distal  aspect of the catheter, which appeared to be in a true knot. For this reason, the gastrojejunostomy   catheter was cut and partially withdrawn into a 12-French peel-away sheath, which was then advanced over the catheter into the proximal duodenum.  A parallel glide wire was advanced.  The peel-away sheath and old gastrojejunostomy catheter were then removed, confirming the true knot in the distal aspect of the jejunal limb.  A new 20-French dual lumen gastrojejunostomy catheter was then advanced, positioned with the distal end of the jejunal limb in the proximal jejunum, and  the retention balloon was inflated in the gastric lumen with 10 ml sterile saline. Injection of both the gastric and jejunal limb with contrast under fluoroscopy demonstrated appropriate positioning and patency. The patient tolerated the procedure well.  No immediate complication.  Fluoroscopy time:  4 minutes 24 seconds  IMPRESSION:  Technically successful exchange of 20-French dual lumen gastrojejunostomy catheter.   Original Report Authenticated By: D. Andria Rhein, MD   Dg Chest Port 1 View  09/13/2012   *RADIOLOGY REPORT*  Clinical Data: Urinary tract infection.  History of hypertension and diabetes.  PORTABLE CHEST - 1 VIEW  Comparison: 05/06/2012.  Findings: 1536 hours.  Right subclavian pacemaker leads are unchanged within the right atrium and right ventricle.  The heart size and mediastinal contours are stable with aortic atherosclerosis.  The overall pulmonary aeration has improved. There is no edema, confluent airspace opacity or significant pleural effusion.  The patient is status post lower cervical thoracic fusion.  IMPRESSION:  No acute cardiopulmonary process.   Original Report Authenticated By: Carey Bullocks, M.D.    CBC  Recent Labs Lab 09/18/12 0455 09/20/12 0801 09/22/12 0435  WBC 4.1 5.2 5.9  HGB 9.2* 9.7* 9.8*  HCT 30.0* 30.1* 31.2*  PLT 303 325 347  MCV 76.7* 78.4 78.2  MCH 23.5* 25.3* 24.6*  MCHC 30.7 32.2 31.4  RDW 17.8* 18.3* 18.5*  LYMPHSABS  --  2.1  --   MONOABS  --  0.4  --   EOSABS  --  0.3  --   BASOSABS  --  0.1  --     Chemistries   Recent Labs Lab 09/16/12 1000 09/18/12 0455 09/20/12 0801 09/22/12 0435  NA 141 140 140 136  K 3.4* 3.3* 4.1 3.7  CL 109 108 109 106  CO2 23 21 22 20   GLUCOSE 97 118* 105* 92  BUN 25* 24* 26* 15  CREATININE 1.12 0.83 0.89 0.70  CALCIUM 8.9 9.2 9.2 9.2  MG  --   --  2.3  --    ------------------------------------------------------------------------------------------------------------------ estimated creatinine clearance is 92.8 ml/min (by C-G formula based on Cr of 0.7). ------------------------------------------------------------------------------------------------------------------ No results found for this basename: HGBA1C,  in the last 72 hours ------------------------------------------------------------------------------------------------------------------ No results found for this basename: CHOL, HDL, LDLCALC, TRIG, CHOLHDL, LDLDIRECT,  in the last 72 hours ------------------------------------------------------------------------------------------------------------------ No results found for this basename: TSH, T4TOTAL, FREET3, T3FREE, THYROIDAB,  in the last 72 hours ------------------------------------------------------------------------------------------------------------------ No results found for this basename: VITAMINB12, FOLATE, FERRITIN, TIBC, IRON, RETICCTPCT,  in the last 72 hours  Coagulation profile  Recent Labs Lab 09/20/12 0801  INR 1.13    No results found for this basename: DDIMER,  in the last 72  hours  Cardiac Enzymes No results found for this basename: CK, CKMB, TROPONINI, MYOGLOBIN,  in the last 168 hours ------------------------------------------------------------------------------------------------------------------ No components found with this basename: POCBNP,

## 2012-09-22 NOTE — Progress Notes (Signed)
Clinical Social Work  Per MD, patient is not medically stable for DC. CSW spoke with [facility] and updated SNF on patient's progress. CSW will continue to follow.  [place], [place] [phone]

## 2012-09-22 NOTE — Progress Notes (Signed)
Patient ID: [name], male   DOB: 1935-12-19, 77 y.o.   MRN: [id number] 3 Days Post-Op  Subjective: Pt does not speak.  He is awake and alert.    Objective: Vital signs in last 24 hours: Temp:  [97.6 F (36.4 C)-98.8 F (37.1 C)] 98.8 F (37.1 C) (06/09 0646) Pulse Rate:  [71-79] 71 (06/09 0646) Resp:  [18-20] 18 (06/09 0646) BP: (142-165)/(82-92) 145/82 mmHg (06/09 0646) SpO2:  [96 %-99 %] 96 % (06/09 0646) Last BM Date: 09/20/12  Intake/Output from previous day: 06/08 0701 - 06/09 0700 In: 300 [I.V.:300] Out: 3050 [Urine:2450; Stool:600] Intake/Output this shift:    PE: Skin: sacral wound clean, but currently has some stool contamination present.  No evidence of infection or necrotic tissue Lab Results:   Recent Labs  09/20/12 0801 09/22/12 0435  WBC 5.2 5.9  HGB 9.7* 9.8*  HCT 30.1* 31.2*  PLT 325 347   BMET  Recent Labs  09/20/12 0801 09/22/12 0435  NA 140 136  K 4.1 3.7  CL 109 106  CO2 22 20  GLUCOSE 105* 92  BUN 26* 15  CREATININE 0.89 0.70  CALCIUM 9.2 9.2   PT/INR  Recent Labs  09/20/12 0801  LABPROT 14.3  INR 1.13   CMP     Component Value Date/Time   NA 136 09/22/2012 0435   K 3.7 09/22/2012 0435   CL 106 09/22/2012 0435   CO2 20 09/22/2012 0435   GLUCOSE 92 09/22/2012 0435   BUN 15 09/22/2012 0435   CREATININE 0.70 09/22/2012 0435   CALCIUM 9.2 09/22/2012 0435   PROT 8.3 09/14/2012 0540   ALBUMIN 2.2* 09/14/2012 0540   AST 20 09/14/2012 0540   ALT 11 09/14/2012 0540   ALKPHOS 80 09/14/2012 0540   BILITOT 0.3 09/14/2012 0540   GFRNONAA >90 09/22/2012 0435   GFRAA >90 09/22/2012 0435   Lipase     Component Value Date/Time   LIPASE 28 09/13/2012 1556       Studies/Results: No results found.  Anti-infectives: Anti-infectives   Start     Dose/Rate Route Frequency Ordered Stop   09/17/12 2300  ertapenem (INVANZ) 1 g in sodium chloride 0.9 % 50 mL IVPB     1 g 100 mL/hr over 30 Minutes Intravenous Every 24 hours 09/17/12 1546     09/16/12 1400   imipenem-cilastatin (PRIMAXIN) 500 mg in sodium chloride 0.9 % 100 mL IVPB  Status:  Discontinued     500 mg 200 mL/hr over 30 Minutes Intravenous 3 times per day 09/16/12 1020 09/17/12 1546   09/14/12 2000  vancomycin (VANCOCIN) 1,250 mg in sodium chloride 0.9 % 250 mL IVPB  Status:  Discontinued     1,250 mg 166.7 mL/hr over 90 Minutes Intravenous Every 24 hours 09/14/12 0237 09/16/12 0942   09/14/12 1000  piperacillin-tazobactam (ZOSYN) IVPB 3.375 g  Status:  Discontinued     3.375 g 12.5 mL/hr over 240 Minutes Intravenous Every 8 hours 09/14/12 0906 09/16/12 1020   09/13/12 2230  vancomycin (VANCOCIN) IVPB 1000 mg/200 mL premix     1,000 mg 200 mL/hr over 60 Minutes Intravenous  Once 09/13/12 2229 09/14/12 0056   09/13/12 2230  piperacillin-tazobactam (ZOSYN) IVPB 3.375 g     3.375 g 100 mL/hr over 30 Minutes Intravenous  Once 09/13/12 2229 09/14/12 0135   09/13/12 1930  cefTRIAXone (ROCEPHIN) 1 g in dextrose 5 % 50 mL IVPB     1 g 100 mL/hr over 30 Minutes Intravenous  Once 09/13/12 1929 09/13/12 2035       Assessment/Plan  1. Sacral decubitus ulcer, s/p flap by [facility] 2. Hemiparesis secondary to CVA  Plan: 1. No surgical intervention warranted as this wound is clean.  Continue local wound care.  Follow up at wound clinic.  No debridement needed, will sign off.   LOS: 9 days    OSBORNE,KELLY E 09/22/2012, 8:21 AM Pager: [phone]  Agree with above.  Ovidio Kin, MD, [facility] Pager: [phone] Office phone:  [phone]

## 2012-09-23 DIAGNOSIS — L0390 Cellulitis, unspecified: Secondary | ICD-10-CM

## 2012-09-23 LAB — GLUCOSE, CAPILLARY: Glucose-Capillary: 129 mg/dL — ABNORMAL HIGH (ref 70–99)

## 2012-09-23 MED ORDER — VANCOMYCIN HCL IN DEXTROSE 1-5 GM/200ML-% IV SOLN: 1000.0000 mg | Freq: Three times a day (TID) | INTRAVENOUS | Status: DC

## 2012-09-23 MED ORDER — SODIUM CHLORIDE 0.9 % IJ SOLN
10.0000 mL | INTRAMUSCULAR | Status: DC | PRN
  Administered 2012-09-23 (×3): 10 mL

## 2012-09-23 MED ORDER — HEPARIN SOD (PORK) LOCK FLUSH 100 UNIT/ML IV SOLN
250.0000 [IU] | INTRAVENOUS | Status: AC | PRN
  Administered 2012-09-23 (×2): 500 [IU]

## 2012-09-23 MED ORDER — SODIUM CHLORIDE 0.9 % IV SOLN: 1.0000 g | INTRAVENOUS | Status: DC

## 2012-09-23 NOTE — Progress Notes (Signed)
Report called to nurse at [facility]

## 2012-09-23 NOTE — Progress Notes (Signed)
Clinical Social Work  CSW faxed DC summary to [facility]. SNF agreeable to accept patient today. CSW prepared DC packet with FL2 included. CSW informed patient, wife, and RN of DC plans and all parties agreeable. CSW set up transportation via [employer] for 12:45pm pick up. (Service Request Number: [id number]). CSW is signing off but available if further needs arise.  [place], [place] [phone]

## 2012-09-23 NOTE — Progress Notes (Signed)
[facility]    Subjective:  Pt feeling a little malaise sp biopsy   Antibiotics:  Anti-infectives   Start     Dose/Rate Route Frequency Ordered Stop   09/23/12 0000  vancomycin (VANCOCIN) 1 GM/200ML SOLN     1,000 mg 200 mL/hr over 60 Minutes Intravenous Every 8 hours 09/23/12 1041     09/23/12 0000  sodium chloride 0.9 % SOLN 50 mL with ertapenem 1 G SOLR 1 g     1 g 100 mL/hr over 30 Minutes Intravenous Every 24 hours 09/23/12 1041     09/22/12 1200  vancomycin (VANCOCIN) IVPB 1000 mg/200 mL premix     1,000 mg 200 mL/hr over 60 Minutes Intravenous Every 8 hours 09/22/12 0947     09/17/12 2300  ertapenem (INVANZ) 1 g in sodium chloride 0.9 % 50 mL IVPB     1 g 100 mL/hr over 30 Minutes Intravenous Every 24 hours 09/17/12 1546     09/16/12 1400  imipenem-cilastatin (PRIMAXIN) 500 mg in sodium chloride 0.9 % 100 mL IVPB  Status:  Discontinued     500 mg 200 mL/hr over 30 Minutes Intravenous 3 times per day 09/16/12 1020 09/17/12 1546   09/14/12 2000  vancomycin (VANCOCIN) 1,250 mg in sodium chloride 0.9 % 250 mL IVPB  Status:  Discontinued     1,250 mg 166.7 mL/hr over 90 Minutes Intravenous Every 24 hours 09/14/12 0237 09/16/12 0942   09/14/12 1000  piperacillin-tazobactam (ZOSYN) IVPB 3.375 g  Status:  Discontinued     3.375 g 12.5 mL/hr over 240 Minutes Intravenous Every 8 hours 09/14/12 0906 09/16/12 1020   09/13/12 2230  vancomycin (VANCOCIN) IVPB 1000 mg/200 mL premix     1,000 mg 200 mL/hr over 60 Minutes Intravenous  Once 09/13/12 2229 09/14/12 0056   09/13/12 2230  piperacillin-tazobactam (ZOSYN) IVPB 3.375 g     3.375 g 100 mL/hr over 30 Minutes Intravenous  Once 09/13/12 2229 09/14/12 0135   09/13/12 1930  cefTRIAXone (ROCEPHIN) 1 g in dextrose 5 % 50 mL IVPB     1 g 100 mL/hr over 30 Minutes Intravenous  Once 09/13/12 1929 09/13/12 2035      Medications: Scheduled Meds: . citalopram  10 mg Oral q morning - 10a  . enoxaparin  (LOVENOX) injection  40 mg Subcutaneous QHS  . ertapenem  1 g Intravenous Q24H  . feeding supplement (GLUCERNA 1.2 CAL)  1,000 mL Per Tube Q24H  . feeding supplement (GLUCERNA 1.2 CAL)  237 mL Per Tube TID PC  . feeding supplement  30 mL Per Tube TID  . ferrous sulfate  300 mg Per Tube BID  . free water  60 mL Per Tube Q6H  . gabapentin  300 mg Per Tube QHS  . glycopyrrolate  1 mg Oral TID  . insulin aspart  0-9 Units Subcutaneous Q8H  . insulin glargine  20 Units Subcutaneous QHS  . levothyroxine  125 mcg Oral QAC breakfast  . liothyronine  25 mcg Oral q morning - 10a  . metoprolol tartrate  25 mg Per Tube BID  . multivitamin  5 mL Per Tube Daily  . scopolamine  1 patch Transdermal Q72H  . traZODone  25 mg Oral QHS  . vancomycin  1,000 mg Intravenous Q8H  . vitamin C  500 mg Per Tube BID   Continuous Infusions: . sodium chloride 20 mL/hr at 09/21/12 1900   PRN Meds:.acetaminophen (TYLENOL) oral liquid 160 mg/5 mL,  diphenhydrAMINE, heparin lock flush, sodium chloride, traMADol   Objective: Weight change:   Intake/Output Summary (Last 24 hours) at 09/23/12 1252 Last data filed at 09/23/12 0615  Gross per 24 hour  Intake 557.66 ml  Output   2550 ml  Net -1992.34 ml   Blood pressure 121/73, pulse 86, temperature 98.6 F (37 C), temperature source Oral, resp. rate 20, height 6\' 2"  (1.88 m), weight 223 lb 12.3 oz (101.5 kg), SpO2 98.00%. Temp:  [98 F (36.7 C)-98.6 F (37 C)] 98.6 F (37 C) (06/10 1610) Pulse Rate:  [81-90] 86 (06/10 0614) Resp:  [18-20] 20 (06/10 0614) BP: (121-145)/(72-88) 121/73 mmHg (06/10 0614) SpO2:  [98 %] 98 % (06/10 0614) Weight:  [223 lb 12.3 oz (101.5 kg)] 223 lb 12.3 oz (101.5 kg) (06/10 0331)  Physical Exam: General: alert oriented CV: rrr, no murmurs gallops rubs Pulm; CTAB GI: soft, Wounds: not examined today Neuro: hemiplegic Lab Results:  Recent Labs  09/22/12 0435  WBC 5.9  HGB 9.8*  HCT 31.2*  PLT 347    BMET  Recent  Labs  09/22/12 0435  NA 136  K 3.7  CL 106  CO2 20  GLUCOSE 92  BUN 15  CREATININE 0.70  CALCIUM 9.2    Micro Results: Recent Results (from the past 240 hour(s))  CULTURE, BLOOD (ROUTINE X 2)     Status: None   Collection Time    09/13/12  3:56 PM      Result Value Range Status   Specimen Description BLOOD RIGHT HAND   Final   Special Requests BOTTLES DRAWN AEROBIC AND ANAEROBIC 5CC   Final   Culture  Setup Time 09/13/2012 19:45   Final   Culture     Final   Value: KLEBSIELLA PNEUMONIAE     Note: Confirmed Extended Spectrum Beta-Lactamase Producer (ESBL) CRITICAL RESULT CALLED TO, READ BACK BY AND VERIFIED WITH: CYNTHIA WHITE 09/16/12 0950 BY SMITHERSJ     Note: Gram Stain Report Called to,Read Back By and Verified With: Gertie Gowda 09/14/12 @ 1:52PM BY RUSCA.   Report Status 09/16/2012 FINAL   Final   Organism ID, Bacteria KLEBSIELLA PNEUMONIAE   Final  CULTURE, BLOOD (ROUTINE X 2)     Status: None   Collection Time    09/13/12  4:41 PM      Result Value Range Status   Specimen Description BLOOD RIGHT HAND   Final   Special Requests BOTTLES DRAWN AEROBIC ONLY 3CC   Final   Culture  Setup Time 09/13/2012 19:45   Final   Culture NO GROWTH 5 DAYS   Final   Report Status 09/19/2012 FINAL   Final  URINE CULTURE     Status: None   Collection Time    09/13/12  6:02 PM      Result Value Range Status   Specimen Description URINE, CATHETERIZED   Final   Special Requests NONE   Final   Culture  Setup Time 09/13/2012 19:48   Final   Colony Count >=100,000 COLONIES/ML   Final   Culture     Final   Value: Multiple bacterial morphotypes present, none predominant. Suggest appropriate recollection if clinically indicated.   Report Status 09/14/2012 FINAL   Final  URINE CULTURE     Status: None   Collection Time    09/17/12  3:19 PM      Result Value Range Status   Specimen Description URINE, CATHETERIZED   Final   Special Requests NONE  Final   Culture  Setup Time 09/18/2012  01:14   Final   Colony Count NO GROWTH   Final   Culture NO GROWTH   Final   Report Status 09/19/2012 FINAL   Final  CULTURE, BLOOD (ROUTINE X 2)     Status: None   Collection Time    09/17/12  4:15 PM      Result Value Range Status   Specimen Description BLOOD RIGHT HAND   Final   Special Requests BOTTLES DRAWN AEROBIC ONLY 3CC   Final   Culture  Setup Time 09/18/2012 00:16   Final   Culture     Final   Value:        BLOOD CULTURE RECEIVED NO GROWTH TO DATE CULTURE WILL BE HELD FOR 5 DAYS BEFORE ISSUING A FINAL NEGATIVE REPORT   Report Status PENDING   Incomplete  CULTURE, BLOOD (ROUTINE X 2)     Status: None   Collection Time    09/17/12  4:24 PM      Result Value Range Status   Specimen Description BLOOD LEFT HAND   Final   Special Requests BOTTLES DRAWN AEROBIC ONLY 3CC   Final   Culture  Setup Time 09/18/2012 00:16   Final   Culture     Final   Value:        BLOOD CULTURE RECEIVED NO GROWTH TO DATE CULTURE WILL BE HELD FOR 5 DAYS BEFORE ISSUING A FINAL NEGATIVE REPORT   Report Status PENDING   Incomplete  CULTURE, ROUTINE-ABSCESS     Status: None   Collection Time    09/22/12 11:51 AM      Result Value Range Status   Specimen Description ABSCESS LT PELVIC   Final   Special Requests Normal   Final   Gram Stain     Final   Value: RARE WBC PRESENT,BOTH PMN AND MONONUCLEAR     NO SQUAMOUS EPITHELIAL CELLS SEEN     NO ORGANISMS SEEN   Culture NO GROWTH 1 DAY   Final   Report Status PENDING   Incomplete    Studies/Results: Ir Fluoro Guide Cv Line Left  09/22/2012   *RADIOLOGY REPORT*  Indication: History of osteomyelitis, in need intravenous access for antibiotic administration  1.  ULTRASOUND AND FLUORSCOPIC GUIDED PICC LINE INSERTION  2.  LIMITED LEFT UPPER EXTREMITY APPROACH VENOGRAM  Intravenous Medications: None  Contrast: 15 ml Omnipaque-300  Fluoroscopy Time:  1 minute, 24-seconds.  Complications: None immediate  Technique / Findings:  The procedure, risks, benefits, and  alternatives were explained to the patient son and informed written consent was obtained.  A timeout was performed prior to the initiation of the procedure.  As the right upper extremity was unable to be adequately abducted from the patient's side due to prior stroke, the left upper extremity was selected for PICC line placement, however PICC line placement required a technologist holding the patient's arm in a very minimal degree of abduction. Of note, the patient refused to jugular approach PICC line placement.  The left upper extremity was prepped with chlorhexidine in a sterile fashion, and a sterile drape was applied covering the operative field.  Maximum barrier sterile technique with sterile gowns and gloves were used for the procedure.  A timeout was performed prior to the initiation of the procedure.  Local anesthesia was provided with 1% lidocaine.  Under direct ultrasound guidance, the leftbrachialvein was accessed with a micropuncture kit after the overlying soft tissues were anesthetized with  1% lidocaine.  An ultrasound image was saved for documentation purposes. As there was difficulty advancing the guide wire centrally, a limited left upper extremity venogram was performed demonstrating focal severe narrowing/near occlusion of the central aspect of the left subclavian vein at the level of the thoracic inlet. As such, the decision was made to place a midline PICC line.  A guidewire was advanced to the level of the axilla for measurement purposes and the PICC line was cut to length.  A peel-away sheath was placed and a 24 cm, 5 [place], dual lumen was inserted to level of the left axilla.  A post procedure spot fluoroscopic was obtained.  The catheter easily aspirated and flushed and was sutured in place.  A dressing was placed.  The patient tolerated the procedure well without immediate post procedural complication.  Impression:  1.  Successful ultrasound and fluoroscopic guided placement of a left  brachial vein approach, 24 cm, 5 French,dual lumen midline PICC with tip terminating within the left axilla.  The PICC line is ready for immediate use.  2. 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   Original Report Authenticated By: Tacey Ruiz, MD   Ir Louanne Skye Obstruc Mat Any Colon Tube W/fluoro  09/22/2012   *RADIOLOGY REPORT*  Clinical Data: Patient with a clogged jejunal limb of the gastrojejunostomy feeding tube.  MECH REMOVAL GI TUBE OBSTRUCTION  Comparison: None.  Findings: Gastrojejunostomy tube is in proper position the left upper quadrant.  The gastric limb flushed easily without resistance or discomfort. The jejunal limb was noted to be obstructed.  After high pressure saline flushing and advancement of a guide wire past the obstruction, the jejunal limb was widely patent.  This was followed by several flushes without resistance or discomfort.  IMPRESSION: Successful unclogging of mechanical obstruction of jejunal limb of gastrojejunostomy feeding tube.  No complications  Read by Brayton El PA-C   Original Report Authenticated By: Tacey Ruiz, MD   Ir US Guide Vasc Access Left  09/22/2012   *RADIOLOGY REPORT*  Indication: History of osteomyelitis, in need intravenous access for antibiotic administration  1.  ULTRASOUND AND FLUORSCOPIC GUIDED PICC LINE INSERTION  2.  LIMITED LEFT UPPER EXTREMITY APPROACH VENOGRAM  Intravenous Medications: None  Contrast: 15 ml Omnipaque-300  Fluoroscopy Time:  1 minute, 24-seconds.  Complications: None immediate  Technique / Findings:  The procedure, risks, benefits, and alternatives were explained to the patient son  and informed written consent was obtained.  A timeout was performed prior to the initiation of the procedure.  As the right upper extremity was unable to be adequately abducted from the patient's side due to prior stroke, the left upper extremity was selected for PICC line placement, however PICC line placement required a technologist holding the patient's arm in a very minimal degree of abduction. Of note, the patient refused to jugular approach PICC line placement.  The left upper extremity was prepped with chlorhexidine in a sterile fashion, and a sterile drape was applied covering the operative field.  Maximum barrier sterile technique with sterile gowns and gloves were used for the procedure.  A timeout was performed prior to the initiation of  the procedure.  Local anesthesia was provided with 1% lidocaine.  Under direct ultrasound guidance, the leftbrachialvein was accessed with a micropuncture kit after the overlying soft tissues were anesthetized with 1% lidocaine.  An ultrasound image was saved for documentation purposes. As there was difficulty advancing the guide wire centrally, a limited left upper extremity venogram was performed demonstrating focal severe narrowing/near occlusion of the central aspect of the left subclavian vein at the level of the thoracic inlet. As such, the decision was made to place a midline PICC line.  A guidewire was advanced to the level of the axilla for measurement purposes and the PICC line was cut to length.  A peel-away sheath was placed and a 24 cm, 5 [place], dual lumen was inserted to level of the left axilla.  A post procedure spot fluoroscopic was obtained.  The catheter easily aspirated and flushed and was sutured in place.  A dressing was placed.  The patient tolerated the procedure well without immediate post procedural complication.  Impression:  1.  Successful ultrasound and fluoroscopic guided placement of a left brachial vein approach, 24 cm, 5 French,dual lumen  midline PICC with tip terminating within the left axilla.  The PICC line is ready for immediate use.  2. 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   Original Report Authenticated By: Tacey Ruiz, MD   Ct Image Guided Fluid Drain By Catheter  09/22/2012   *RADIOLOGY REPORT*  Indication: History of sacral decubitus ulcer and osteomyelitis involving the left SI joint, now with enlarging fluid collection adjacent to the left pelvic sidewall.  Please perform CT guided aspiration for culture and sensitivity.  CT GUIDED LEFT PELVIC SIDE WALL FLUID COLLECTION ASPIRATION  Comparison:  CT abdomen pelvis - 09/18/2012; 04/30/2012  Intravenous medications: Fentanyl 100 mcg IV; Versed 2 mg IV  Sedation time: 13 minutes  Contrast volume: None  Complications: None immediate  PROCEDURE/FINDINGS:  Informed consent was obtained from the patient's family following an explanation of the procedure, risks, benefits and alternatives. The patient understands, agrees and consents for the procedure. All questions were addressed.  A time out was performed prior to the initiation of the procedure.  The patient was positioned prone and noncontrast localization CT was performed of the pelvis redemonstrated grossly unchanged appearance of the approximately 2.0 x 6.2 cm collection adjacent to the left pelvic sidewall (image 29, series 2).  The operative site was prepped and draped in the usual sterile fashion.  Under sterile conditions and local anesthesia, a 22 gauge spinal needle was utilized for procedural planning.  A 5-French Yueh sheath needle was then advanced into the left pelvic side wall fluid  collection.  Needle position was confirmed with CT imaging. Less than 1 ml of slightly blood tinged fluid was able to be aspirated.  Again, a 5-French Yueh sheath needle was advanced into a slightly different position within the left pelvic sidewall fluid collection.  Approximately 2 ml of saline was injected and subsequently aspirated.  All aspirated samples were capped and sent to the laboratory for analysis.  Hemostasis was achieved at the access site with manual compression. The patient tolerated the procedure well without immediate postprocedural  complication.  IMPRESSION:  Technically successful CT guided aspiration of fluid collection adjacent to the left hemipelvis sidewall.   Original Report Authenticated By: Tacey Ruiz, MD      Assessment/Plan: AUDI WETTSTEIN is a 77 y.o. male with history of prior mitral valve endocarditis pacemaker placement for bradycardia a stroke with , large stage IV decubitus ulcer that failed graft placement now admitted with extended spectrum beta-lactamase producing Klebsiella pneumonia bacteremia without clear cut cause. He also has a notable history for Clostridium difficile colitis. 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 Additional portal of entry would include his very large sacral decubitus ulcer under which DOES have underlying osteomyelitis with a small abscess in internal obturator muscle  He did not have TEE due to concerns by Cardiology for risk with procedure. TTE did not show vegetations -repeat blood cultures have been negative. Also seen by [facility] who find no indication for surgical intervention. IR have aspirated, biopsied  his obturator abscess  --pt will need a protracted course of IV abx for at least 6 weeks given osteomyelitis that has progressed  --antibiotics will need to include INVANZ PLUS vancomycin UNLESS IF we have no growth from obturator because there would always be risk that he could also have deep infection with MRSA or Coag Neg staph that is not recovered from this culture   #2 large sacral decubitus ulcer:  See above discussion  #3 Obturator abscess: See above, could have been source for the ESBL bacteremia but need to cover MRSA, COag Neg staph as well  #3 screening : screening for HIV and hepatitis C  Is negative  I will arrange for followup in our clinic next week after he is discharged and at the end of his course of therapy  He will need weekly cbc, bmp and vancomycin levels faxed to me at [phone]  He is going to [facility].   LOS: 10 days   Acey Lav 09/23/2012, 12:52 PM

## 2012-09-23 NOTE — Discharge Summary (Addendum)
[facility]                                                                                   [name], is a 77 y.o. male  DOB 05/20/1935  MRN [id number].  Admission date:  09/13/2012  Discharge Date:  09/23/2012  Primary MD  HASSELL Angelique Blonder, MD  Admitting Physician  Meredeth Ide, MD  Admission Diagnosis  Altered mental status [780.97] Urinary tract infection [599.0] Hypothyroidism [244.9] Hypertension [401.9] Fever [780.60] DM (diabetes mellitus), secondary, uncontrolled, with peripheral vascular complications [249.71, 443.81] Dysphagia due to old stroke [438.82] Decubitus skin ulcer, unspecified pressure ulcer stage [707.00, 707.20]  Discharge Diagnosis     Principal Problem:   Sepsis Active Problems:   History of stroke with residual effects   Hypothyroidism   uti   Fever   Leukocytosis   Decubitus ulcer of buttock, stage 4   AKI (acute kidney injury)   Bacteremia   Malnutrition of moderate degree    Past Medical History  Diagnosis Date  . Stroke     Hx of with right hemiparesis  . Hypertension   . Endocarditis, mitral valve, syphilitic   . Hypothyroidism   . Anorexia   . Dysphagia   . Reflux   . Bacteremia   . Clostridium difficile colitis   . CVA (cerebral vascular accident)   . Pressure ulcer   . Diabetes mellitus, type 2   . Anemia   . Stroke     Past Surgical History  Procedure Laterality Date  . Colostomy    . Peg placement    . Hernia repair       Recommendations for primary care physician for things to follow:   Stable   Discharge Diagnoses:   Principal Problem:   Sepsis Active Problems:   History of stroke with residual effects   Hypothyroidism   uti   Fever   Leukocytosis   Decubitus ulcer of buttock, stage 4   AKI (acute kidney injury)   Bacteremia   Malnutrition of moderate degree    Discharge Condition: Stable   Diet recommendation: See Discharge Instructions below   Consults ID, IR,  CCS    History of present illness and  Hospital Course:     Kindly see H&P for history of present illness and admission details, please review complete Labs, Consult reports and Test reports for all details in brief [name], is a 77 y.o. male, patient with history of CVA with residual hemiparesis on the right, h/o Mitral valve endocarditis with enterococcus and a staphylococcal coagulase bacteremia, patient has been residing at skilled facility for past 3 years and is totally dependent for his care. Patient has PEG tube in place, chronic Foley catheter, colostomy bag in place. Patient has a large coccygeal decubitus ulcer which had been from time. Patient had a flap done a year ago and the ulcer failed to heal at this time as per the wife patient is not a candidate for further surgery. Colostomy was done to prevent soiling of the ulcer. His workup so far is suggestive of Klebsiella bacteremia likely source is urine versus sacral osteo mellitus, ID and  general surgery saw the patient, he underwent aspiration of his small sacral fluid collection by IR on 09/22/2012, for now he will be placed on 5 more weeks of Invanz from today 6 more weeks of vancomycin from today, he must follow with ID physicians within a week to get results of his aspiration cultures and to get his antibiotics adjusted.    He also developed acute renal failure which is resolved after IV fluids.   DM-2 home dose Lantus and sliding-scale regimen will be continued his A1c was 8.7, for his hypo-thyroidism he will continue his home dose Synthroid.   Stage IV coccygeal decubitus ulcer with evidence of chronic [place] mellitus versus small deep abscess, status post CT-guided aspiration by IR, culture is pending, for now he will be placed on 5 more weeks of Invanz from today 6 more weeks of vancomycin from today, he must follow with ID physicians within a week to get results of his aspiration cultures and to get his antibiotics  adjusted. General surgery saw the patient did not recommend any surgical intervention at this time. Of note he has left him PICC line placed  On 09/22/2012 which is ending in his axilla therefore it should be considered as a midline.   Note Vancomycin can be given only for 6 days through midline, if ID decides longer Vanco course, patient should get an IJ PICC through IR.    History of CVA with right-sided hemiparesis, chronic bedbound status, chronic indwelling Foley and PEG tube. Continue supportive care as before, he does eat by mouth, please obtain a nutritionist consult to evaluate the need of ongoing PEG tube feeds, his PEG tube both G. and J. ports were clogged and they were D. called by IR on 09/22/2012. Both ports are not functional, moderate protein calorie malnutrition continue protein supplementation and obtained a crush this consult.     Today   Subjective:   Gareth Fitzner today has no headache,no chest abdominal pain,no new weakness tingling or numbness, feels much better.    Objective:   Blood pressure 121/73, pulse 86, temperature 98.6 F (37 C), temperature source Oral, resp. rate 20, height 6\' 2"  (1.88 m), weight 101.5 kg (223 lb 12.3 oz), SpO2 98.00%.   Intake/Output Summary (Last 24 hours) at 09/23/12 1041 Last data filed at 09/23/12 0615  Gross per 24 hour  Intake 557.66 ml  Output   2550 ml  Net -1992.34 ml    Exam Awake Alert, Oriented *3, No new F.N deficits, Normal affect [place].AT,PERRAL Supple Neck,No JVD, No cervical lymphadenopathy appriciated.  Symmetrical Chest wall movement, Good air movement bilaterally, CTAB RRR,No Gallops,Rubs or new Murmurs, No Parasternal Heave +ve B.Sounds, Abd Soft, Non tender, No organomegaly appriciated, No rebound -guarding or rigidity. No Cyanosis, Clubbing or edema, No new Rash or bruise  Data Review   Major procedures and Radiology Reports - PLEASE review detailed and final reports for all details in brief -        Ct Abdomen Pelvis W Contrast  09/18/2012   *RADIOLOGY REPORT*  Clinical Data: Bacteremia of unknown origin.  CT ABDOMEN AND PELVIS WITH CONTRAST  Technique:  Multidetector CT imaging of the abdomen and pelvis was performed following the standard protocol during bolus administration of intravenous contrast.  Contrast: OMNIPAQUE IOHEXOL 300 MG/ML  SOLN  Comparison: CT scan dated 04/30/2012  Findings: The patient has osteomyelitis of the left sacroiliac joint.  The infection has spread into the left iliacus muscle and extends down into the left  internal obturator muscle along the inner aspect of the left ilium medial to the left acetabulum.  The liver, spleen, pancreas, adrenal glands, and kidneys are normal.  Gallbladder has been removed.  No dilatation of the bile ducts.  Colostomy in the left lower quadrant.  The bowel appears normal.  Foley catheter in place.  Chronic degenerative changes at both hips.  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  Infection has spread into the left iliacus muscle and into the left internal obturator where there is a subtle abscess.   Original Report Authenticated By: Francene Boyers, M.D.   Ir Gj Tube Change  09/01/2012   *RADIOLOGY REPORT*  Clinical Data: Old in the dual lumen gastrojejunostomy catheter  GASTROSTOMY CATHETER REPLACEMENT  Comparison: 02/05/2012  Findings: The previously placed gastrojejunostomy catheter and surrounding skin were prepped and draped in usual sterile fashion. Contrast could not be injected through the jejunal lumen. Fluoroscopic inspection demonstrated the tip of the catheter in the region of the ligament of Treitz.  A glide wire would not pass beyond the distal aspect of the catheter, which appeared to be in a true knot. For this reason, the gastrojejunostomy   catheter was cut and partially withdrawn into a 12-French peel-away sheath, which was then advanced over the catheter into the proximal  duodenum.  A parallel glide wire was advanced.  The peel-away sheath and old gastrojejunostomy catheter were then removed, confirming the true knot in the distal aspect of the jejunal limb.  A new 20-French dual lumen gastrojejunostomy catheter was then advanced, positioned with the distal end of the jejunal limb in the proximal jejunum, and  the retention balloon was inflated in the gastric lumen with 10 ml sterile saline. Injection of both the gastric and jejunal limb with contrast under fluoroscopy demonstrated appropriate positioning and patency. The patient tolerated the procedure well.  No immediate complication.  Fluoroscopy time:  4 minutes 24 seconds  IMPRESSION:  Technically successful exchange of 20-French dual lumen gastrojejunostomy catheter.   Original Report Authenticated By: D. Andria Rhein, MD   Ir Horace Porteous Guide Cv Line Left  09/22/2012   *RADIOLOGY REPORT*  Indication: History of osteomyelitis, in need intravenous access for antibiotic administration  1.  ULTRASOUND AND FLUORSCOPIC GUIDED PICC LINE INSERTION  2.  LIMITED LEFT UPPER EXTREMITY APPROACH VENOGRAM  Intravenous Medications: None  Contrast: 15 ml Omnipaque-300  Fluoroscopy Time:  1 minute, 24-seconds.  Complications: None immediate  Technique / Findings:  The procedure, risks, benefits, and alternatives were explained to the patient son and informed written consent was obtained.  A timeout was performed prior to the initiation of the procedure.  As the right upper extremity was unable to be adequately abducted from the patient's side due to prior stroke, the left upper extremity was selected for PICC line placement, however PICC line placement required a technologist holding the patient's arm in a very minimal degree of abduction. Of note, the patient refused to jugular approach PICC line placement.  The left upper extremity was prepped with chlorhexidine in a sterile fashion, and a sterile drape was applied covering the operative field.   Maximum barrier sterile technique with sterile gowns and gloves were used for the procedure.  A timeout was performed prior to the initiation of the procedure.  Local anesthesia was provided with 1% lidocaine.  Under direct ultrasound guidance, the leftbrachialvein was accessed with a micropuncture kit after the overlying soft tissues were anesthetized with 1% lidocaine.  An ultrasound image was saved for documentation purposes. As there was difficulty advancing the guide wire centrally, a limited left upper extremity venogram was performed demonstrating focal severe narrowing/near occlusion of the central aspect of the left subclavian vein at the level of the thoracic inlet. As such, the decision was made to place a midline PICC line.  A guidewire was advanced to the level of the axilla for measurement purposes and the PICC line was cut to length.  A peel-away sheath was placed and a 24 cm, 5 [place], dual lumen was inserted to level of the left axilla.  A post procedure spot fluoroscopic was obtained.  The catheter easily aspirated and flushed and was sutured in place.  A dressing was placed.  The patient tolerated the procedure well without immediate post procedural complication.  Impression:  1.  Successful ultrasound and fluoroscopic guided placement of a left brachial vein approach, 24 cm, 5 French,dual lumen midline PICC with tip terminating within the left axilla.  The PICC line is ready for immediate use.  2. 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   Original Report Authenticated By: Tacey Ruiz, MD   Ir Louanne Skye Obstruc Mat Any Colon Tube W/fluoro  09/22/2012    *RADIOLOGY REPORT*  Clinical Data: Patient with a clogged jejunal limb of the gastrojejunostomy feeding tube.  MECH REMOVAL GI TUBE OBSTRUCTION  Comparison: None.  Findings: Gastrojejunostomy tube is in proper position the left upper quadrant.  The gastric limb flushed easily without resistance or discomfort. The jejunal limb was noted to be obstructed.  After high pressure saline flushing and advancement of a guide wire past the obstruction, the jejunal limb was widely patent.  This was followed by several flushes without resistance or discomfort.  IMPRESSION: Successful unclogging of mechanical obstruction of jejunal limb of gastrojejunostomy feeding tube.  No complications  Read by Brayton El PA-C   Original Report Authenticated By: Tacey Ruiz, MD   Ir US Guide Vasc Access Left  09/22/2012   *RADIOLOGY REPORT*  Indication: History of osteomyelitis, in need intravenous access for antibiotic administration  1.  ULTRASOUND AND FLUORSCOPIC GUIDED PICC LINE INSERTION  2.  LIMITED LEFT UPPER EXTREMITY APPROACH VENOGRAM  Intravenous Medications: None  Contrast: 15 ml Omnipaque-300  Fluoroscopy Time:  1 minute, 24-seconds.  Complications: None immediate  Technique / Findings:  The procedure, risks, benefits, and alternatives were explained to the patient son and informed written consent was obtained.  A timeout was performed prior to the initiation of the procedure.  As the right upper extremity was unable to be adequately abducted from the patient's side due to prior stroke, the left upper extremity was selected for PICC line placement, however PICC line placement required a technologist holding the patient's arm in a very minimal degree of abduction. Of note, the patient refused to jugular approach PICC line placement.  The left upper extremity was prepped with chlorhexidine in a sterile fashion, and a sterile drape was applied covering the operative field.  Maximum barrier sterile technique with sterile gowns  and gloves were used for the procedure.  A timeout was performed prior to the initiation of the procedure.  Local anesthesia was provided with 1% lidocaine.  Under direct ultrasound guidance, the leftbrachialvein was accessed with a micropuncture kit after the overlying soft tissues were anesthetized with 1% lidocaine.  An ultrasound image was saved for documentation purposes. As there was difficulty advancing the guide wire centrally, a limited left upper extremity venogram was performed demonstrating focal severe narrowing/near occlusion of the central aspect of the left subclavian vein at the level of the thoracic inlet. As such, the decision was made to place a midline PICC line.  A guidewire was advanced to the level of the axilla for measurement purposes and the PICC line was cut to length.  A peel-away sheath was placed and a 24 cm, 5 [place], dual lumen was inserted to level of the left axilla.  A post procedure spot fluoroscopic was obtained.  The catheter easily aspirated and flushed and was sutured in place.  A dressing was placed.  The patient tolerated the procedure well without immediate post procedural complication.  Impression:  1.  Successful ultrasound and fluoroscopic guided placement of a left brachial vein approach, 24 cm, 5 French,dual lumen midline PICC with tip terminating within the left axilla.  The PICC line is ready for immediate use.  2. 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   Original Report Authenticated By: Tacey Ruiz, MD   Dg Chest Port 1 View  09/13/2012   *RADIOLOGY REPORT*  Clinical Data: Urinary tract infection.  History of  hypertension and diabetes.  PORTABLE CHEST - 1 VIEW  Comparison: 05/06/2012.  Findings: 1536 hours.  Right subclavian pacemaker leads are unchanged within the right atrium and right ventricle.  The heart size and mediastinal contours are stable with aortic atherosclerosis.  The overall pulmonary aeration has improved. There is no edema, confluent airspace opacity or significant pleural effusion.  The patient is status post lower cervical thoracic fusion.  IMPRESSION: No acute cardiopulmonary process.   Original Report Authenticated By: Carey Bullocks, M.D.   Ct Image Guided Fluid Drain By Catheter  09/22/2012   *RADIOLOGY REPORT*  Indication: History of sacral decubitus ulcer and osteomyelitis involving the left SI joint, now with enlarging fluid collection adjacent to the left pelvic sidewall.  Please perform CT guided aspiration for culture and sensitivity.  CT GUIDED LEFT PELVIC SIDE WALL FLUID COLLECTION ASPIRATION  Comparison:  CT abdomen pelvis - 09/18/2012; 04/30/2012  Intravenous medications: Fentanyl 100 mcg IV; Versed 2 mg IV  Sedation time: 13 minutes  Contrast volume: None  Complications: None immediate  PROCEDURE/FINDINGS:  Informed consent was obtained from the patient's family following an explanation of the procedure, risks, benefits and alternatives. The patient understands, agrees and consents for the procedure. All questions were addressed.  A time out was performed prior to the initiation of the procedure.  The patient was positioned prone and noncontrast localization CT was performed of the pelvis redemonstrated grossly unchanged appearance of the approximately 2.0 x 6.2 cm collection adjacent to the left pelvic sidewall (image 29, series 2).  The operative site was prepped and draped in the usual sterile fashion.  Under sterile conditions and local anesthesia, a 22 gauge spinal needle was utilized for procedural planning.  A 5-French Yueh sheath needle was then advanced into the left pelvic  side wall fluid collection.  Needle position was confirmed with CT imaging. Less than 1 ml of slightly blood tinged fluid was able to be aspirated.  Again, a 5-French Yueh sheath needle was advanced into a slightly different position within the left pelvic sidewall fluid collection.  Approximately 2 ml of saline was injected and subsequently aspirated.  All aspirated samples were capped and sent to the laboratory for analysis.  Hemostasis was achieved at the access site with manual compression. The patient tolerated the procedure well without immediate postprocedural complication.  IMPRESSION:  Technically successful CT guided aspiration of fluid collection adjacent to the left hemipelvis sidewall.   Original Report Authenticated By: Tacey Ruiz, MD    Micro Results      Recent Results (from the past 240 hour(s))  CULTURE, BLOOD (ROUTINE X 2)     Status: None   Collection Time    09/13/12  3:56 PM      Result Value Range Status   Specimen Description BLOOD RIGHT HAND   Final   Special Requests BOTTLES DRAWN AEROBIC AND ANAEROBIC 5CC   Final   Culture  Setup Time 09/13/2012 19:45   Final   Culture     Final   Value: KLEBSIELLA PNEUMONIAE     Note: Confirmed Extended Spectrum Beta-Lactamase Producer (ESBL) CRITICAL RESULT CALLED TO, READ BACK BY AND VERIFIED WITH: CYNTHIA WHITE 09/16/12 0950 BY SMITHERSJ     Note: Gram Stain Report Called to,Read Back By and Verified With: Gertie Gowda 09/14/12 @ 1:52PM BY RUSCA.   Report Status 09/16/2012 FINAL   Final   Organism ID, Bacteria KLEBSIELLA PNEUMONIAE   Final  CULTURE, BLOOD (ROUTINE X 2)     Status: None   Collection Time    09/13/12  4:41 PM      Result Value Range Status   Specimen Description BLOOD RIGHT HAND   Final   Special Requests BOTTLES DRAWN AEROBIC ONLY 3CC   Final   Culture  Setup Time 09/13/2012 19:45   Final   Culture NO GROWTH 5 DAYS   Final   Report Status 09/19/2012 FINAL   Final  URINE CULTURE     Status: None   Collection  Time    09/13/12  6:02 PM      Result Value Range Status   Specimen Description URINE, CATHETERIZED   Final   Special Requests NONE   Final   Culture  Setup Time 09/13/2012 19:48   Final   Colony Count >=100,000 COLONIES/ML   Final   Culture     Final   Value: Multiple bacterial morphotypes present, none predominant. Suggest appropriate recollection if clinically indicated.   Report Status 09/14/2012 FINAL   Final  URINE CULTURE     Status: None   Collection Time    09/17/12  3:19 PM      Result Value Range Status   Specimen Description URINE, CATHETERIZED   Final   Special Requests NONE   Final   Culture  Setup Time 09/18/2012 01:14   Final   Colony Count NO GROWTH   Final   Culture NO GROWTH   Final   Report Status 09/19/2012 FINAL   Final  CULTURE, BLOOD (ROUTINE X 2)     Status: None   Collection Time    09/17/12  4:15 PM      Result Value Range Status   Specimen Description BLOOD RIGHT  HAND   Final   Special Requests BOTTLES DRAWN AEROBIC ONLY 3CC   Final   Culture  Setup Time 09/18/2012 00:16   Final   Culture     Final   Value:        BLOOD CULTURE RECEIVED NO GROWTH TO DATE CULTURE WILL BE HELD FOR 5 DAYS BEFORE ISSUING A FINAL NEGATIVE REPORT   Report Status PENDING   Incomplete  CULTURE, BLOOD (ROUTINE X 2)     Status: None   Collection Time    09/17/12  4:24 PM      Result Value Range Status   Specimen Description BLOOD LEFT HAND   Final   Special Requests BOTTLES DRAWN AEROBIC ONLY 3CC   Final   Culture  Setup Time 09/18/2012 00:16   Final   Culture     Final   Value:        BLOOD CULTURE RECEIVED NO GROWTH TO DATE CULTURE WILL BE HELD FOR 5 DAYS BEFORE ISSUING A FINAL NEGATIVE REPORT   Report Status PENDING   Incomplete  CULTURE, ROUTINE-ABSCESS     Status: None   Collection Time    09/22/12 11:51 AM      Result Value Range Status   Specimen Description ABSCESS LT PELVIC   Final   Special Requests Normal   Final   Gram Stain     Final   Value: RARE WBC  PRESENT,BOTH PMN AND MONONUCLEAR     NO SQUAMOUS EPITHELIAL CELLS SEEN     NO ORGANISMS SEEN   Culture NO GROWTH 1 DAY   Final   Report Status PENDING   Incomplete     CBC w Diff: Lab Results  Component Value Date   WBC 5.9 09/22/2012   HGB 9.8* 09/22/2012   HCT 31.2* 09/22/2012   PLT 347 09/22/2012   LYMPHOPCT 40 09/20/2012   BANDSPCT 0 01/19/2011   MONOPCT 8 09/20/2012   EOSPCT 5 09/20/2012   BASOPCT 2* 09/20/2012    CMP: Lab Results  Component Value Date   NA 136 09/22/2012   K 3.7 09/22/2012   CL 106 09/22/2012   CO2 20 09/22/2012   BUN 15 09/22/2012   CREATININE 0.70 09/22/2012   PROT 8.3 09/14/2012   ALBUMIN 2.2* 09/14/2012   BILITOT 0.3 09/14/2012   ALKPHOS 80 09/14/2012   AST 20 09/14/2012   ALT 11 09/14/2012  .   Discharge Instructions     Follow with Primary MD HASSELL III,DAYNE DANIEL, MD in 3 days , follow with the infectious disease clinic within 3-4 days for culture results and antibiotic changes.  Get CBC, CMP, checked 3 days by Primary MD and again as instructed by your Primary MD.  Get Medicines reviewed and adjusted.  Please request your Prim.MD to go over all Hospital Tests and Procedure/Radiological results at the follow up, please get all Hospital records sent to your Prim MD by signing hospital release before you go home.  Activity: As tolerated with Full fall precautions use walker/cane & assistance as needed   Diet:  Tube feeds via PEG tube and medications via PEG tube, dysphagia 3 diet orally with aspiration precautions and assistance, please have nutritionist evaluate the patient and address the need of ongoing PEG tube feeds.    For Heart failure patients - Check your Weight same time everyday, if you gain over 2 pounds, or you develop in leg swelling, experience more shortness of breath or chest pain, call your Primary MD immediately. Follow  Cardiac Low Salt Diet and 1.8 lit/day fluid restriction.  Disposition SNF  If you experience worsening of your admission symptoms,  develop shortness of breath, life threatening emergency, suicidal or homicidal thoughts you must seek medical attention immediately by calling 911 or calling your MD immediately  if symptoms less severe.  You Must read complete instructions/literature along with all the possible adverse reactions/side effects for all the Medicines you take and that have been prescribed to you. Take any new Medicines after you have completely understood and accpet all the possible adverse reactions/side effects.   Do not drive and provide baby sitting services if your were admitted for syncope or siezures until you have seen by Primary MD or a Neurologist and advised to do so again.  Do not drive when taking Pain medications.    Do not take more than prescribed Pain, Sleep and Anxiety Medications  Special Instructions: If you have smoked or chewed Tobacco  in the last 2 yrs please stop smoking, stop any regular Alcohol  and or any Recreational drug use.  Wear Seat belts while driving.   Please note  You were cared for by a hospitalist during your hospital stay. If you have any questions about your discharge medications or the care you received while you were in the hospital after you are discharged, you can call the unit and asked to speak with the hospitalist on call if the hospitalist that took care of you is not available. Once you are discharged, your primary care physician will handle any further medical issues. Please note that NO REFILLS for any discharge medications will be authorized once you are discharged, as it is imperative that you return to your primary care physician (or establish a relationship with a primary care physician if you do not have one) for your aftercare needs so that they can reassess your need for medications and monitor your lab values.     Follow-up Information   Follow up with HASSELL Angelique Blonder, MD. Schedule an appointment as soon as possible for a visit in 3 days.    Contact information:   [address] [place] [address] [phone]       Follow up with Acey Lav, MD. Schedule an appointment as soon as possible for a visit in 4 days.   Contact information:   [address] [place] [address] [phone]         Discharge Medications     Medication List    TAKE these medications       acetaminophen 160 MG/5ML liquid  Commonly known as:  TYLENOL  Take 640 mg by mouth every 4 (four) hours as needed for fever.     citalopram 10 MG tablet  Commonly known as:  CELEXA  10 mg by PEG Tube route every morning.     feeding supplement Liqd  Place 30 mLs into feeding tube 3 (three) times daily with meals.     ferrous sulfate 300 (60 FE) MG/5ML syrup  Give 300 mg by tube 2 (two) times daily.     gabapentin 300 MG capsule  Commonly known as:  NEURONTIN  Give 300 mg by tube at bedtime.     glycopyrrolate 1 MG tablet  Commonly known as:  ROBINUL  1 mg by PEG Tube route 3 (three) times daily.     insulin aspart 100 UNIT/ML injection  Commonly known as:  novoLOG  Inject 5 Units into  the skin 3 (three) times daily before meals. For cbg > 250     insulin glargine 100 UNIT/ML injection  Commonly known as:  LANTUS  Inject 20 Units into the skin at bedtime.     levothyroxine 125 MCG tablet  Commonly known as:  SYNTHROID, LEVOTHROID  Take 125 mcg by mouth every morning.     liothyronine 25 MCG tablet  Commonly known as:  CYTOMEL  25 mcg by PEG Tube route every morning.     metoprolol tartrate 25 MG tablet  Commonly known as:  LOPRESSOR  Give 25 mg by tube 2 (two) times daily.     multivitamins ther. w/minerals Tabs  1 tablet by PEG Tube route every morning.     potassium chloride 20 MEQ/15ML (10%) solution  Give 20 mEq by tube every morning.     scopolamine 1.5 MG  Commonly known as:  TRANSDERM-SCOP  Place 1 patch onto the skin every 3 (three) days.     sodium chloride 0.9 %  SOLN 50 mL with ertapenem 1 G SOLR 1 g  Inject 1 g into the vein daily. For 5 more weeks, must follow with infectious disease physician within a week and get antibiotics adjusted.     thiamine 100 MG tablet  Commonly known as:  VITAMIN B-1  100 mg by PEG Tube route every morning.     traMADol 50 MG tablet  Commonly known as:  ULTRAM  50 mg by PEG Tube route every 6 (six) hours as needed for pain.     traZODone 50 MG tablet  Commonly known as:  DESYREL  Take 25 mg by mouth at bedtime. For insomnia     vancomycin 1 GM/200ML Soln  Commonly known as:  VANCOCIN  Inject 200 mLs (1,000 mg total) into the vein every 8 (eight) hours. For 5 more weeks, must see infectious disease doctor within one week and get antibiotics adjusted.     vitamin C 500 MG tablet  Commonly known as:  ASCORBIC ACID  Give 500 mg by tube 2 (two) times daily.           Total Time in preparing paper work, data evaluation and todays exam - 35 minutes  Leroy Sea M.D on 09/23/2012 at 10:41 AM  [facility] Office  [phone]

## 2012-09-23 NOTE — Progress Notes (Signed)
Clinical Social Work  CSW received a call from [employer] reporting that MD had changed medications on AVS. CSW sent AVS via [facility] to SNF. CSW spoke with Tammy at SNF who is aware of changes. CSW is signing off.  [place], [phone]

## 2012-09-24 LAB — CULTURE, BLOOD (ROUTINE X 2)

## 2012-09-25 LAB — CULTURE, ROUTINE-ABSCESS

## 2012-09-27 ENCOUNTER — Other Ambulatory Visit: Payer: Self-pay

## 2012-09-27 LAB — VANCOMYCIN, TROUGH: Vancomycin, Trough: 41 ug/mL (ref 10–20)

## 2012-09-27 LAB — CREATININE, SERUM
Creatinine: 1.05 mg/dL (ref 0.60–1.30)
EGFR (African American): >60

## 2012-09-27 LAB — VANCOMYCIN, RANDOM: Vancomycin, Random: 46 ug/mL

## 2012-09-30 ENCOUNTER — Ambulatory Visit (INDEPENDENT_AMBULATORY_CARE_PROVIDER_SITE_OTHER): Payer: Medicare Other | Admitting: Internal Medicine

## 2012-09-30 ENCOUNTER — Encounter: Payer: Self-pay | Admitting: Internal Medicine

## 2012-09-30 VITALS — BP 112/78 | HR 80 | Temp 97.6°F

## 2012-09-30 DIAGNOSIS — L89324 Pressure ulcer of left buttock, stage 4: Secondary | ICD-10-CM

## 2012-09-30 DIAGNOSIS — L8994 Pressure ulcer of unspecified site, stage 4: Secondary | ICD-10-CM

## 2012-09-30 DIAGNOSIS — R7881 Bacteremia: Secondary | ICD-10-CM

## 2012-09-30 DIAGNOSIS — L89309 Pressure ulcer of unspecified buttock, unspecified stage: Secondary | ICD-10-CM

## 2012-09-30 DIAGNOSIS — A419 Sepsis, unspecified organism: Secondary | ICD-10-CM

## 2012-09-30 NOTE — Progress Notes (Signed)
Patient ID: [name], male   DOB: December 20, 1935, 77 y.o.   MRN: [id number]         [facility] for Infectious Disease  Patient Active Problem List   Diagnosis Date Noted  . Bacteremia 09/15/2012  . Malnutrition of moderate degree 09/15/2012  . Sepsis 09/14/2012  . Decubitus ulcer of buttock, stage 4 09/14/2012  . AKI (acute kidney injury) 09/14/2012  . SIRS (systemic inflammatory response syndrome) 05/01/2012  . Aspiration pneumonia 05/01/2012  . Nausea & vomiting 05/01/2012  . Chronic anemia 05/01/2012  . Clostridium difficile colitis 05/01/2012  . Acute respiratory failure with hypoxia 05/01/2012  . HCAP (healthcare-associated pneumonia) 05/01/2012  . Fever 05/01/2012  . Leukocytosis 05/01/2012  . Dehydration 05/01/2012  . Metabolic encephalopathy 05/01/2012  . Hematemesis 04/13/2012  . Hyperkalemia 04/13/2012  . Back pain 02/14/2012  . Altered mental state 02/12/2012  . Lactic acidosis 02/12/2012  . Sacral decubitus ulcer, stage IV 02/12/2012  . uti 02/12/2012  . Pacemaker 05/22/2011  . Endocarditis of native valve 02/20/2011  . Dysphagia due to old stroke 02/19/2011  . History of stroke with residual effects 02/19/2011  . Hypothyroidism 02/19/2011  . DM (diabetes mellitus), secondary, uncontrolled, with peripheral vascular complications 02/19/2011  . Hypertension 02/19/2011    Patient's Medications  New Prescriptions   No medications on file  Previous Medications   ACETAMINOPHEN (TYLENOL) 160 MG/5ML LIQUID    Take 640 mg by mouth every 4 (four) hours as needed for fever.   CITALOPRAM (CELEXA) 10 MG TABLET    10 mg by PEG Tube route every morning.    FEEDING SUPPLEMENT (PRO-STAT SUGAR FREE 64) LIQD    Place 30 mLs into feeding tube 3 (three) times daily with meals.   FERROUS SULFATE 300 (60 FE) MG/5ML SYRUP    Give 300 mg by tube 2 (two) times daily.     GABAPENTIN (NEURONTIN) 300 MG CAPSULE    Give 300 mg by tube at bedtime.    GLYCOPYRROLATE (ROBINUL) 1  MG TABLET    1 mg by PEG Tube route 3 (three) times daily.   INSULIN ASPART (NOVOLOG) 100 UNIT/ML INJECTION    Inject 5 Units into the skin 3 (three) times daily before meals. For cbg > 250   INSULIN GLARGINE (LANTUS) 100 UNIT/ML INJECTION    Inject 20 Units into the skin at bedtime.   LEVOTHYROXINE (SYNTHROID, LEVOTHROID) 125 MCG TABLET    Take 125 mcg by mouth every morning.   LIOTHYRONINE (CYTOMEL) 25 MCG TABLET    25 mcg by PEG Tube route every morning.    METOPROLOL TARTRATE (LOPRESSOR) 25 MG TABLET    Give 25 mg by tube 2 (two) times daily.    MULTIPLE VITAMINS-MINERALS (MULTIVITAMINS THER. W/MINERALS) TABS    1 tablet by PEG Tube route every morning.    POTASSIUM CHLORIDE 20 MEQ/15ML (10%) SOLUTION    Give 20 mEq by tube every morning.    SCOPOLAMINE (TRANSDERM-SCOP) 1.5 MG    Place 1 patch onto the skin every 3 (three) days.   SODIUM CHLORIDE 0.9 % SOLN 50 ML WITH ERTAPENEM 1 G SOLR 1 G    Inject 1 g into the vein daily. For 5 more weeks, must follow with infectious disease physician within a week and get antibiotics adjusted.   THIAMINE HCL (VITAMIN B-1) 100 MG TABLET    100 mg by PEG Tube route every morning.    TRAMADOL (ULTRAM) 50 MG TABLET    50 mg by PEG  Tube route every 6 (six) hours as needed for pain.   TRAZODONE (DESYREL) 50 MG TABLET    Take 25 mg by mouth at bedtime. For insomnia   VANCOMYCIN (VANCOCIN) 1 GM/200ML SOLN    Inject 200 mLs (1,000 mg total) into the vein every 8 (eight) hours. For 5 more weeks, must see infectious disease doctor within one week and get antibiotics adjusted.   VITAMIN C (ASCORBIC ACID) 500 MG TABLET    Give 500 mg by tube 2 (two) times daily.   Modified Medications   No medications on file  Discontinued Medications   No medications on file    Subjective: [name] is in for his hospital followup visit. He was seen by my partner earlier this month for a Klebsiella bacteremia. 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 He  started on antibiotics and repeat blood cultures were negative. A biopsy of the left sacroiliac joint obtained after antibiotics started was culture negative. There is no evidence of pacemaker endocarditis or native valve endocarditis by transthoracic echocardiography. Dr. Daiva Eves elected to treat him with 6 weeks of IV ertapenem and vancomycin. He is now completed 17 days of therapy. He does not recall much about admission to the hospital when he was delirious. He is feeling much better. His appetite is improving. He has had no problems tolerating his PICC or antibiotics. He has not had any fever. Apparently he had a flap procedure for his chronic sacral decubitus one year ago that failed and he has been left with a chronic nonhealing ulcer ever since. Sounds like he goes to the local wound care center sporadically.  Review of Systems: Pertinent items are noted in HPI.  Past Medical History  Diagnosis Date  . Stroke     Hx of with right hemiparesis  . Hypertension   . Endocarditis, mitral valve, syphilitic   . Hypothyroidism   . Anorexia   . Dysphagia   . Reflux   . Bacteremia   . Clostridium difficile colitis   . CVA (cerebral vascular accident)   . Pressure ulcer   . Diabetes mellitus, type 2   . Anemia   . Stroke     History  Substance Use Topics  . Smoking status: Never Smoker   . Smokeless tobacco: Not on file  . Alcohol Use: No    Family History  Problem Relation Age of Onset  . Cancer Mother   . Other Father     old age after a fall  . Alzheimer's disease Sister     No Known Allergies  Objective: Temp: 97.6 F (36.4 C) (06/17 1403) Temp src: Oral (06/17 1403) BP: 112/78 mmHg (06/17 1403) Pulse Rate: 80 (06/17 1403)  General: He is in good spirits laying on the transport stretcher Skin: Left arm PICC site appears normal Lungs: Clear Cor: Regular S1 and S2 and no murmurs; pacemaker site appears normal. 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 Infection has spread into the left iliacus muscle and into  the left internal obturator where there is a subtle abscess.  Original Report Authenticated By: Francene Boyers, M.D.        Assessment: He seems to be improving on broad therapy for Klebsiella bacteremia and pelvic osteomyelitis. I will have him complete a total  of 42 days of therapy through July 12.  Plan: 1. Continue current antibiotics through July 12 2. Followup in 3-4 weeks   Cliffton Asters, MD [facility] for [facility]ious [facility] [phone] pager   [phone] cell 09/30/2012, 2:38 PM

## 2012-10-02 ENCOUNTER — Other Ambulatory Visit: Payer: Self-pay | Admitting: Internal Medicine

## 2012-10-02 DIAGNOSIS — B999 Unspecified infectious disease: Secondary | ICD-10-CM

## 2012-10-03 ENCOUNTER — Non-Acute Institutional Stay (SKILLED_NURSING_FACILITY): Payer: Medicare Other | Admitting: Internal Medicine

## 2012-10-03 ENCOUNTER — Encounter (HOSPITAL_COMMUNITY): Payer: Self-pay | Admitting: Unknown Physician Specialty

## 2012-10-03 ENCOUNTER — Ambulatory Visit (HOSPITAL_COMMUNITY)
Admission: RE | Admit: 2012-10-03 | Discharge: 2012-10-03 | Disposition: A | Discharge status: Home / Self Care | Payer: Medicare Other | Source: Ambulatory Visit | Attending: Internal Medicine | Admitting: Internal Medicine

## 2012-10-03 ENCOUNTER — Other Ambulatory Visit: Payer: Self-pay

## 2012-10-03 ENCOUNTER — Emergency Department (HOSPITAL_COMMUNITY): Payer: Medicare Other

## 2012-10-03 ENCOUNTER — Inpatient Hospital Stay (HOSPITAL_COMMUNITY)
Admission: EM | Admit: 2012-10-03 | Discharge: 2012-10-07 | DRG: 871 | Disposition: A | Discharge status: Skilled Nursing Facility | Payer: Medicare Other | Attending: Internal Medicine | Admitting: Internal Medicine

## 2012-10-03 ENCOUNTER — Encounter: Payer: Self-pay | Admitting: Internal Medicine

## 2012-10-03 DIAGNOSIS — I798 Other disorders of arteries, arterioles and capillaries in diseases classified elsewhere: Secondary | ICD-10-CM

## 2012-10-03 DIAGNOSIS — M869 Osteomyelitis, unspecified: Secondary | ICD-10-CM

## 2012-10-03 DIAGNOSIS — F329 Major depressive disorder, single episode, unspecified: Secondary | ICD-10-CM

## 2012-10-03 DIAGNOSIS — L89153 Pressure ulcer of sacral region, stage 3: Secondary | ICD-10-CM

## 2012-10-03 DIAGNOSIS — L8993 Pressure ulcer of unspecified site, stage 3: Secondary | ICD-10-CM

## 2012-10-03 DIAGNOSIS — E1169 Type 2 diabetes mellitus with other specified complication: Secondary | ICD-10-CM | POA: Diagnosis present

## 2012-10-03 DIAGNOSIS — M8668 Other chronic osteomyelitis, other site: Secondary | ICD-10-CM | POA: Diagnosis present

## 2012-10-03 DIAGNOSIS — E440 Moderate protein-calorie malnutrition: Secondary | ICD-10-CM

## 2012-10-03 DIAGNOSIS — R7881 Bacteremia: Secondary | ICD-10-CM

## 2012-10-03 DIAGNOSIS — IMO0002 Reserved for concepts with insufficient information to code with codable children: Secondary | ICD-10-CM

## 2012-10-03 DIAGNOSIS — R112 Nausea with vomiting, unspecified: Secondary | ICD-10-CM

## 2012-10-03 DIAGNOSIS — L8994 Pressure ulcer of unspecified site, stage 4: Secondary | ICD-10-CM | POA: Diagnosis present

## 2012-10-03 DIAGNOSIS — Z7401 Bed confinement status: Secondary | ICD-10-CM

## 2012-10-03 DIAGNOSIS — D649 Anemia, unspecified: Secondary | ICD-10-CM

## 2012-10-03 DIAGNOSIS — R6510 Systemic inflammatory response syndrome (SIRS) of non-infectious origin without acute organ dysfunction: Secondary | ICD-10-CM

## 2012-10-03 DIAGNOSIS — I10 Essential (primary) hypertension: Secondary | ICD-10-CM

## 2012-10-03 DIAGNOSIS — I69991 Dysphagia following unspecified cerebrovascular disease: Secondary | ICD-10-CM

## 2012-10-03 DIAGNOSIS — M9080 Osteopathy in diseases classified elsewhere, unspecified site: Secondary | ICD-10-CM | POA: Diagnosis present

## 2012-10-03 DIAGNOSIS — G9341 Metabolic encephalopathy: Secondary | ICD-10-CM

## 2012-10-03 DIAGNOSIS — M4628 Osteomyelitis of vertebra, sacral and sacrococcygeal region: Secondary | ICD-10-CM

## 2012-10-03 DIAGNOSIS — L89154 Pressure ulcer of sacral region, stage 4: Secondary | ICD-10-CM

## 2012-10-03 DIAGNOSIS — E1351 Other specified diabetes mellitus with diabetic peripheral angiopathy without gangrene: Secondary | ICD-10-CM

## 2012-10-03 DIAGNOSIS — E039 Hypothyroidism, unspecified: Secondary | ICD-10-CM

## 2012-10-03 DIAGNOSIS — K219 Gastro-esophageal reflux disease without esophagitis: Secondary | ICD-10-CM | POA: Diagnosis present

## 2012-10-03 DIAGNOSIS — R1310 Dysphagia, unspecified: Secondary | ICD-10-CM | POA: Diagnosis present

## 2012-10-03 DIAGNOSIS — M549 Dorsalgia, unspecified: Secondary | ICD-10-CM

## 2012-10-03 DIAGNOSIS — J690 Pneumonitis due to inhalation of food and vomit: Secondary | ICD-10-CM

## 2012-10-03 DIAGNOSIS — E875 Hyperkalemia: Secondary | ICD-10-CM

## 2012-10-03 DIAGNOSIS — Z794 Long term (current) use of insulin: Secondary | ICD-10-CM

## 2012-10-03 DIAGNOSIS — N390 Urinary tract infection, site not specified: Secondary | ICD-10-CM

## 2012-10-03 DIAGNOSIS — E860 Dehydration: Secondary | ICD-10-CM

## 2012-10-03 DIAGNOSIS — R0902 Hypoxemia: Secondary | ICD-10-CM | POA: Diagnosis present

## 2012-10-03 DIAGNOSIS — D62 Acute posthemorrhagic anemia: Secondary | ICD-10-CM

## 2012-10-03 DIAGNOSIS — E872 Acidosis, unspecified: Secondary | ICD-10-CM

## 2012-10-03 DIAGNOSIS — G92 Toxic encephalopathy: Secondary | ICD-10-CM | POA: Diagnosis present

## 2012-10-03 DIAGNOSIS — K920 Hematemesis: Secondary | ICD-10-CM

## 2012-10-03 DIAGNOSIS — Z933 Colostomy status: Secondary | ICD-10-CM

## 2012-10-03 DIAGNOSIS — L89109 Pressure ulcer of unspecified part of back, unspecified stage: Secondary | ICD-10-CM | POA: Diagnosis present

## 2012-10-03 DIAGNOSIS — I38 Endocarditis, valve unspecified: Secondary | ICD-10-CM

## 2012-10-03 DIAGNOSIS — D72829 Elevated white blood cell count, unspecified: Secondary | ICD-10-CM

## 2012-10-03 DIAGNOSIS — N179 Acute kidney failure, unspecified: Secondary | ICD-10-CM

## 2012-10-03 DIAGNOSIS — R630 Anorexia: Secondary | ICD-10-CM | POA: Diagnosis present

## 2012-10-03 DIAGNOSIS — Z931 Gastrostomy status: Secondary | ICD-10-CM

## 2012-10-03 DIAGNOSIS — J9601 Acute respiratory failure with hypoxia: Secondary | ICD-10-CM

## 2012-10-03 DIAGNOSIS — I69391 Dysphagia following cerebral infarction: Secondary | ICD-10-CM

## 2012-10-03 DIAGNOSIS — A419 Sepsis, unspecified organism: Principal | ICD-10-CM

## 2012-10-03 DIAGNOSIS — Z950 Presence of cardiac pacemaker: Secondary | ICD-10-CM

## 2012-10-03 DIAGNOSIS — I6930 Unspecified sequelae of cerebral infarction: Secondary | ICD-10-CM

## 2012-10-03 DIAGNOSIS — A0472 Enterocolitis due to Clostridium difficile, not specified as recurrent: Secondary | ICD-10-CM

## 2012-10-03 DIAGNOSIS — R4182 Altered mental status, unspecified: Secondary | ICD-10-CM

## 2012-10-03 DIAGNOSIS — J189 Pneumonia, unspecified organism: Secondary | ICD-10-CM

## 2012-10-03 DIAGNOSIS — B999 Unspecified infectious disease: Secondary | ICD-10-CM

## 2012-10-03 DIAGNOSIS — Z79899 Other long term (current) drug therapy: Secondary | ICD-10-CM

## 2012-10-03 DIAGNOSIS — G929 Unspecified toxic encephalopathy: Secondary | ICD-10-CM | POA: Diagnosis present

## 2012-10-03 DIAGNOSIS — I69959 Hemiplegia and hemiparesis following unspecified cerebrovascular disease affecting unspecified side: Secondary | ICD-10-CM

## 2012-10-03 DIAGNOSIS — R509 Fever, unspecified: Secondary | ICD-10-CM

## 2012-10-03 LAB — GLUCOSE, CAPILLARY: Glucose-Capillary: 164 mg/dL — ABNORMAL HIGH (ref 70–99)

## 2012-10-03 LAB — COMPREHENSIVE METABOLIC PANEL
ALT: 16 U/L (ref 0–53)
AST: 23 U/L (ref 0–37)
Albumin: 2.4 g/dL — ABNORMAL LOW (ref 3.5–5.2)
Alkaline Phosphatase: 93 U/L (ref 39–117)
Calcium: 9.5 mg/dL (ref 8.4–10.5)
Potassium: 4.2 mEq/L (ref 3.5–5.1)
Sodium: 135 mEq/L (ref 135–145)
Total Protein: 7.5 g/dL (ref 6.0–8.3)

## 2012-10-03 LAB — CBC WITH DIFFERENTIAL/PLATELET
Basophils Relative: 0 % (ref 0–1)
Lymphocytes Relative: 9 % — ABNORMAL LOW (ref 12–46)
Lymphs Abs: 1.2 10*3/uL (ref 0.7–4.0)
MCH: 26.1 pg (ref 26.0–34.0)
MCV: 80.1 fL (ref 78.0–100.0)
Monocytes Absolute: 1.3 10*3/uL — ABNORMAL HIGH (ref 0.1–1.0)
Monocytes Relative: 10 % (ref 3–12)
Neutro Abs: 10.1 10*3/uL — ABNORMAL HIGH (ref 1.7–7.7)
Neutrophils Relative %: 80 % — ABNORMAL HIGH (ref 43–77)
Platelets: 208 10*3/uL (ref 150–400)
RDW: 19 % — ABNORMAL HIGH (ref 11.5–15.5)
WBC: 12.6 10*3/uL — ABNORMAL HIGH (ref 4.0–10.5)

## 2012-10-03 LAB — CBC
Platelets: 190 10*3/uL (ref 150–400)
RBC: 3.97 MIL/uL — ABNORMAL LOW (ref 4.22–5.81)
RDW: 19 % — ABNORMAL HIGH (ref 11.5–15.5)
WBC: 14.5 10*3/uL — ABNORMAL HIGH (ref 4.0–10.5)

## 2012-10-03 LAB — URINE MICROSCOPIC-ADD ON

## 2012-10-03 LAB — CREATININE, SERUM
Creatinine, Ser: 1.18 mg/dL (ref 0.50–1.35)
GFR calc Af Amer: 68 mL/min — ABNORMAL LOW (ref >90)
GFR calc non Af Amer: 59 mL/min — ABNORMAL LOW (ref >90)

## 2012-10-03 LAB — URINALYSIS, ROUTINE W REFLEX MICROSCOPIC
Bilirubin Urine: NEGATIVE
Glucose, UA: NEGATIVE mg/dL
Ketones, ur: NEGATIVE mg/dL
Specific Gravity, Urine: 1.019 (ref 1.005–1.030)
pH: 6 (ref 5.0–8.0)

## 2012-10-03 LAB — POCT I-STAT TROPONIN I: Troponin i, poc: 0.05 ng/mL (ref 0.00–0.08)

## 2012-10-03 LAB — LACTIC ACID, PLASMA: Lactic Acid, Venous: 2.3 mmol/L — ABNORMAL HIGH (ref 0.5–2.2)

## 2012-10-03 MED ORDER — SODIUM CHLORIDE 0.9 % IV SOLN: 250.0000 mL | INTRAVENOUS | Status: DC | PRN

## 2012-10-03 MED ORDER — FAMOTIDINE 40 MG/5ML PO SUSR
20.0000 mg | Freq: Two times a day (BID) | ORAL | Status: DC
  Administered 2012-10-03 – 2012-10-07 (×8): 20 mg
  Filled 2012-10-03 (×9): qty 2.5

## 2012-10-03 MED ORDER — FAMOTIDINE 20 MG PO TABS
20.0000 mg | ORAL_TABLET | Freq: Two times a day (BID) | ORAL | Status: DC
  Filled 2012-10-03: qty 1

## 2012-10-03 MED ORDER — VANCOMYCIN HCL IN DEXTROSE 1-5 GM/200ML-% IV SOLN
1000.0000 mg | Freq: Three times a day (TID) | INTRAVENOUS | Status: DC
  Filled 2012-10-03 (×2): qty 200

## 2012-10-03 MED ORDER — INSULIN ASPART 100 UNIT/ML ~~LOC~~ SOLN
0.0000 [IU] | SUBCUTANEOUS | Status: DC
  Administered 2012-10-04 (×2): 2 [IU] via SUBCUTANEOUS
  Administered 2012-10-04: 3 [IU] via SUBCUTANEOUS

## 2012-10-03 MED ORDER — VANCOMYCIN HCL 10 G IV SOLR
1500.0000 mg | Freq: Once | INTRAVENOUS | Status: AC
  Administered 2012-10-03: 1500 mg via INTRAVENOUS
  Filled 2012-10-03: qty 1500

## 2012-10-03 MED ORDER — SODIUM CHLORIDE 0.9 % IV BOLUS (SEPSIS)
2000.0000 mL | Freq: Once | INTRAVENOUS | Status: AC
  Administered 2012-10-03: 1000 mL via INTRAVENOUS

## 2012-10-03 MED ORDER — ACETAMINOPHEN 160 MG/5ML PO SOLN
640.0000 mg | ORAL | Status: DC | PRN
  Filled 2012-10-03: qty 20

## 2012-10-03 MED ORDER — DEXTROSE 5 % IV SOLN
1.0000 g | Freq: Three times a day (TID) | INTRAVENOUS | Status: DC
  Administered 2012-10-04 – 2012-10-06 (×8): 1 g via INTRAVENOUS
  Filled 2012-10-03 (×10): qty 1

## 2012-10-03 MED ORDER — CEFTAZIDIME 2 G IJ SOLR
2.0000 g | Freq: Once | INTRAMUSCULAR | Status: AC
  Administered 2012-10-03: 2 g via INTRAVENOUS
  Filled 2012-10-03: qty 2

## 2012-10-03 MED ORDER — SODIUM CHLORIDE 0.9 % IV SOLN
INTRAVENOUS | Status: DC
  Administered 2012-10-03: 500 mL via INTRAVENOUS
  Administered 2012-10-03 – 2012-10-04 (×2): 1000 mL via INTRAVENOUS
  Administered 2012-10-05: 12:00:00 via INTRAVENOUS

## 2012-10-03 MED ORDER — SODIUM CHLORIDE 0.9 % IV SOLN
1.0000 g | INTRAVENOUS | Status: DC
  Administered 2012-10-03 – 2012-10-05 (×3): 1 g via INTRAVENOUS
  Filled 2012-10-03 (×5): qty 1

## 2012-10-03 MED ORDER — PRO-STAT SUGAR FREE PO LIQD
30.0000 mL | Freq: Three times a day (TID) | ORAL | Status: DC
  Administered 2012-10-04: 30 mL
  Filled 2012-10-03 (×4): qty 30

## 2012-10-03 MED ORDER — LIOTHYRONINE SODIUM 25 MCG PO TABS
25.0000 ug | ORAL_TABLET | Freq: Every morning | ORAL | Status: DC
  Administered 2012-10-04 – 2012-10-07 (×4): 25 ug
  Filled 2012-10-03 (×4): qty 1

## 2012-10-03 MED ORDER — LEVOTHYROXINE SODIUM 125 MCG PO TABS
125.0000 ug | ORAL_TABLET | Freq: Every morning | ORAL | Status: DC
  Administered 2012-10-04 – 2012-10-07 (×4): 125 ug
  Filled 2012-10-03 (×4): qty 1

## 2012-10-03 MED ORDER — SCOPOLAMINE 1 MG/3DAYS TD PT72
1.0000 | MEDICATED_PATCH | TRANSDERMAL | Status: DC
  Administered 2012-10-04: 1.5 mg via TRANSDERMAL
  Filled 2012-10-03 (×2): qty 1

## 2012-10-03 MED ORDER — HEPARIN SODIUM (PORCINE) 5000 UNIT/ML IJ SOLN
5000.0000 [IU] | Freq: Three times a day (TID) | INTRAMUSCULAR | Status: DC
  Administered 2012-10-03 – 2012-10-07 (×12): 5000 [IU] via SUBCUTANEOUS
  Filled 2012-10-03 (×14): qty 1

## 2012-10-03 MED ORDER — VITAL AF 1.2 CAL PO LIQD
1000.0000 mL | ORAL | Status: DC
  Administered 2012-10-03: 1000 mL
  Filled 2012-10-03 (×3): qty 1000

## 2012-10-03 MED ORDER — ACETAMINOPHEN 160 MG/5ML PO SOLN
650.0000 mg | ORAL | Status: DC | PRN
  Filled 2012-10-03: qty 20.3

## 2012-10-03 MED ORDER — DEXTROSE 5 % IV SOLN
1.0000 g | Freq: Two times a day (BID) | INTRAVENOUS | Status: DC
  Filled 2012-10-03: qty 1

## 2012-10-03 MED ORDER — GABAPENTIN 300 MG PO CAPS
300.0000 mg | ORAL_CAPSULE | Freq: Every day | ORAL | Status: DC
  Administered 2012-10-03 – 2012-10-06 (×4): 300 mg
  Filled 2012-10-03 (×5): qty 1

## 2012-10-03 MED ORDER — SODIUM CHLORIDE 0.9 % IV BOLUS (SEPSIS): 1000.0000 mL | INTRAVENOUS | Status: DC | PRN

## 2012-10-03 MED ORDER — CEFTAZIDIME 2 G IJ SOLR: 2.0000 g | Freq: Once | INTRAMUSCULAR | Status: DC

## 2012-10-03 NOTE — ED Notes (Signed)
Patient arrived via [facility] from [facility]. Patient was brought to [facility] earlier today to have PICC line replaced. IR was unable to do intervention due to patient being combative and inability to be laid flat. He was returned to NH. EMS was called due to [facility] staff stating patient was unresponsive in room.

## 2012-10-03 NOTE — ED Notes (Signed)
Family at bedside. 

## 2012-10-03 NOTE — ED Provider Notes (Addendum)
I saw and evaluated the patient, reviewed the resident's note and I agree with the findings and plan.  Pt with sob and mental status changes--febrile and hypotensive, code sepsis initiated, cxr with hcap, will admit to [facility]  I was involved in the key interventions of the patient  Toy Baker, MD 10/03/12 2112  Toy Baker, MD 11/24/12 [phone]

## 2012-10-03 NOTE — ED Provider Notes (Signed)
History     CSN: [id number]  Arrival date & time 10/03/12  1658   First MD Initiated Contact with Patient 10/03/12 1659      No chief complaint on file.   (Consider location/radiation/quality/duration/timing/severity/associated sxs/prior treatment) Patient is a 77 y.o. male presenting with altered mental status. The history is provided by the EMS personnel and the nursing home.  Altered Mental Status Presenting symptoms: confusion   Severity:  Moderate Most recent episode:  Today Episode history:  Single Duration:  1 day Timing:  Constant Progression:  Unchanged Chronicity:  New Context comment:  Noted to be altered when he arrived from hospital to have his PICC line evaluated.  Associated symptoms: difficulty breathing and fever   Associated symptoms comment:  Per nursing at SNF altered with labored breathing and cough.    Past Medical History  Diagnosis Date  . Stroke     Hx of with right hemiparesis  . Hypertension   . Endocarditis, mitral valve, syphilitic   . Hypothyroidism   . Anorexia   . Dysphagia   . Reflux   . Bacteremia   . Clostridium difficile colitis   . CVA (cerebral vascular accident)   . Pressure ulcer   . Diabetes mellitus, type 2   . Anemia   . Stroke     Past Surgical History  Procedure Laterality Date  . Colostomy    . Peg placement    . Hernia repair      Family History  Problem Relation Age of Onset  . Cancer Mother   . Other Father     old age after a fall  . Alzheimer's disease Sister     History  Substance Use Topics  . Smoking status: Never Smoker   . Smokeless tobacco: Not on file  . Alcohol Use: No      Review of Systems  Unable to perform ROS: Mental status change  Constitutional: Positive for fever.  Psychiatric/Behavioral: Positive for confusion and altered mental status.    Allergies  Review of patient's allergies indicates no known allergies.  Home Medications   Current Outpatient Rx  Name  Route  Sig   Dispense  Refill  . acetaminophen (TYLENOL) 160 MG/5ML liquid   Tube   Give 640 mg by tube every 4 (four) hours as needed for fever.          . citalopram (CELEXA) 10 MG tablet   PEG Tube   10 mg by PEG Tube route every morning.          . feeding supplement (PRO-STAT SUGAR FREE 64) LIQD   Per Tube   Place 30 mLs into feeding tube 3 (three) times daily with meals.         . ferrous sulfate 300 (60 FE) MG/5ML syrup   Tube   Give 300 mg by tube 2 (two) times daily.           Marland Kitchen gabapentin (NEURONTIN) 300 MG capsule   Tube   Give 300 mg by tube at bedtime.          Marland Kitchen glycopyrrolate (ROBINUL) 1 MG tablet   PEG Tube   1 mg by PEG Tube route 3 (three) times daily.         . insulin aspart (NOVOLOG) 100 UNIT/ML injection   Subcutaneous   Inject 5 Units into the skin 3 (three) times daily before meals. For cbg > 250         . insulin glargine (  LANTUS) 100 UNIT/ML injection   Subcutaneous   Inject 20 Units into the skin at bedtime.         Marland Kitchen levothyroxine (SYNTHROID, LEVOTHROID) 125 MCG tablet   Tube   Give 125 mcg by tube every morning.          Marland Kitchen liothyronine (CYTOMEL) 25 MCG tablet   PEG Tube   25 mcg by PEG Tube route every morning.          . metoprolol tartrate (LOPRESSOR) 25 MG tablet   Tube   Give 25 mg by tube 2 (two) times daily.          . Multiple Vitamins-Minerals (MULTIVITAMINS THER. W/MINERALS) TABS   PEG Tube   1 tablet by PEG Tube route every morning.          . potassium chloride 20 MEQ/15ML (10%) solution   Tube   Give 20 mEq by tube every morning.          Marland Kitchen scopolamine (TRANSDERM-SCOP) 1.5 MG   Transdermal   Place 1 patch onto the skin every 3 (three) days.         . sodium chloride 0.9 % SOLN 50 mL with ertapenem 1 G SOLR 1 g   Intravenous   Inject 1 g into the vein daily. For 5 more weeks, must follow with infectious disease physician within a week and get antibiotics adjusted.         . Thiamine HCl (VITAMIN  B-1) 100 MG tablet   PEG Tube   100 mg by PEG Tube route every morning.          . traMADol (ULTRAM) 50 MG tablet   PEG Tube   50 mg by PEG Tube route every 6 (six) hours as needed for pain.         . traZODone (DESYREL) 50 MG tablet   Tube   Give 25 mg by tube at bedtime. For insomnia         . vancomycin (VANCOCIN) 1 GM/200ML SOLN   Intravenous   Inject 200 mLs (1,000 mg total) into the vein every 8 (eight) hours. For 5 more weeks, must see infectious disease doctor within one week and get antibiotics adjusted.   4000 mL      . vitamin C (ASCORBIC ACID) 500 MG tablet   Tube   Give 500 mg by tube 2 (two) times daily.            BP 100/56  Pulse 100  Resp 23  SpO2 95%  Physical Exam  Nursing note and vitals reviewed. Constitutional: He appears well-developed and well-nourished. No distress.  HENT:  Head: Normocephalic and atraumatic.  Right Ear: External ear normal.  Left Ear: External ear normal.  Mouth/Throat: Oropharynx is clear and moist.  Dry mucus membranes.   Eyes: Pupils are equal, round, and reactive to light.  Neck: Normal range of motion. Neck supple.  Cardiovascular: Regular rhythm, normal heart sounds and intact distal pulses.  Exam reveals no gallop and no friction rub.   No murmur heard. Tachycardic.   Pulmonary/Chest: Effort normal. No respiratory distress. He has no wheezes. He has rales.  Diminished breath sounds with scattered rhonchi  Abdominal: Soft. There is tenderness (mild TTP around colostomy. colostomy site clean and dry. G tube site clean and dry, no drainage. ). There is no rebound and no guarding.  Musculoskeletal: Normal range of motion. He exhibits no edema and no tenderness.  Lymphadenopathy:  He has no cervical adenopathy.  Neurological: He is alert.  Oriented to person only. Follows simple commands.   Skin: Skin is warm and dry. No rash noted. No erythema.    ED Course  CENTRAL LINE Date/Time: 10/03/2012 11:51  PM Performed by: Caren Hazy Authorized by: Maisie Fus Consent: The procedure was performed in an emergent situation. Time out: Immediately prior to procedure a "time out" was called to verify the correct patient, procedure, equipment, support staff and site/side marked as required. Indications: vascular access Patient sedated: no Preparation: skin prepped with ChloraPrep Skin prep agent dried: skin prep agent completely dried prior to procedure Sterile barriers: all five maximum sterile barriers used - cap, mask, sterile gown, sterile gloves, and large sterile sheet Hand hygiene: hand hygiene performed prior to central venous catheter insertion Location details: right internal jugular Patient position: Trendelenburg Catheter type: triple lumen Ultrasound guidance: yes Number of attempts: 2 Successful placement: yes Post-procedure: line sutured and dressing applied Assessment: blood return through all ports, placement verified by x-ray, no pneumothorax on x-ray and free fluid flow Patient tolerance: Patient tolerated the procedure well with no immediate complications.   (including critical care time)  Labs Reviewed  CBC WITH DIFFERENTIAL - Abnormal; Notable for the following:    WBC 12.6 (*)    RBC 4.18 (*)    Hemoglobin 10.9 (*)    HCT 33.5 (*)    RDW 19.0 (*)    Neutrophils Relative % 80 (*)    Neutro Abs 10.1 (*)    Lymphocytes Relative 9 (*)    Monocytes Absolute 1.3 (*)    All other components within normal limits  GLUCOSE, CAPILLARY - Abnormal; Notable for the following:    Glucose-Capillary 164 (*)    All other components within normal limits  CULTURE, BLOOD (ROUTINE X 2)  CULTURE, BLOOD (ROUTINE X 2)  URINE CULTURE  COMPREHENSIVE METABOLIC PANEL  URINALYSIS, ROUTINE W REFLEX MICROSCOPIC   Ir Fluoro Rm 30-60 Min  10/03/2012   *RADIOLOGY REPORT*  Clinical Data: Status post placement of left upper extremity midline catheter to the level of the axillary vein for  IV vancomycin administration.  The catheter could not be advanced centrally due to underlying venous stenosis.  There is inability to aspirate blood from the catheter currently.  IR FLOURO RM 0-60 MIN  Fluoro time:  3 seconds.  Comparison: Imaging during midline catheter placement on 09/22/2012.  Findings: Fluoroscopy was performed showing stable positioning of the midline catheter at the level of the rib margin on the left within the axillary vein.  Aspiration did not yield blood.  The catheter flushed easily.  The catheter was retracted by a few centimeters and additional manipulation did not yield blood with aspiration.  During manipulation, the patient experience significant nausea and vomiting and had to be suctioned.  He could not tolerate any further manipulation or replacement of the catheter.  This catheter is clearly intravascular and can be used for vancomycin administration.  IMPRESSION: Left arm midline catheter tip is within the axillary vein.  This catheter flushed easily.  With retraction, there was persistent inability to aspirate blood, likely reflecting fibrin sheath at the tip of the catheter.  Manipulation of the catheter had to be abruptly discontinued due to development of nausea with active vomiting requiring suctioning of the oropharynx.  The catheter can continue to be used for IV administration currently.   Original Report Authenticated By: Irish Lack, M.D.   Dg Chest Portable 1 View  10/03/2012   *  RADIOLOGY REPORT*  Clinical Data: Central line placement.  Sepsis.  PORTABLE CHEST - 1 VIEW 6:43 p.m.  Comparison: 10/03/2012 at 5:28 p.m.  Findings: Right jugular vein catheter has been inserted and the tip is in the superior vena cava at the cavoatrial junction in good position.  No pneumothorax.  Faint infiltrate in the right upper lobe.  Lungs otherwise clear.  Heart size and vascularity are normal.  Dual lead pacer in place.  No acute osseous abnormality.  Fairly severe arthritis of  the right shoulder joint.  IMPRESSION:  1.  Central line good position with no pneumothorax. 2.  Right upper lobe infiltrate.   Original Report Authenticated By: Francene Boyers, M.D.   Dg Chest Port 1 View  10/03/2012   *RADIOLOGY REPORT*  Clinical Data: Sepsis and weakness  PORTABLE CHEST - 1 VIEW  Comparison: 09/13/2012  Findings: Right chest wall pacer device is noted with lead in the right atrial appendage and right ventricle.  Heart size is normal. Bilateral hazy lung opacities are identified and appear upper lobe predominant. No pleural effusion identified.  Postsurgical changes within the cervical spine noted compatible with anterior and posterior fusion.  IMPRESSION:  1.  Bilateral hazy lung opacities which may indicate multifocal pneumonia or edema.   Original Report Authenticated By: Signa Kell, M.D.     Clinical Impression:  1. Severe Sepsis 2. Multifocal pneumonia, health care associated 3. Altered mental status 4. Lactic acidosis    MDM  61:55 PM 77 year old male with a history of stroke with residual right-sided weakness, diabetes and chronic sacral wound resident of skilled nursing facility presenting with altered mental status. Patient was discharged from hospital on June 10 after presenting with sepsis. At this time he was found to be bacteremic with ESBL Klebsiella. His was subsequently discharge with a PICC line and IV vancomycin and IV Invanz. His PICC line has not been working properly for the past day or two so sent today to have it investigated by IR but this could not done. On arrival blood pressure in the 70s, patient hypoxic and altered from baseline. Access difficult so right internal jugular CVC placed under ultrasound guidance. Blood cultures drawn. Discussed with pharmacy regarding antibiotic recommendations. Will treat with vancomycin and ceftazidime at this time. If renal function okay he will also give aminoglycoside. Continuing to fluid resuscitate. Critical care  has been counseled.  9:03 PM CXR c/w multifocal PNA. BP improved with IVF. Pt admitted for further management.       Caren Hazy, MD 10/03/12 2352

## 2012-10-03 NOTE — H&P (Addendum)
PULMONARY  / CRITICAL CARE MEDICINE  Name: [name] MRN: [id number] DOB: May 14, 1935    ADMISSION DATE:  10/03/2012   REFERRING MD :  Emergency department PRIMARY SERVICE: PCCM  CHIEF COMPLAINT: Altered mental status  BRIEF PATIENT DESCRIPTION: 77 year-old male nursing home resident with severe sepsis possible healthcare associated pneumonia, acute kidney injury.  SIGNIFICANT EVENTS / STUDIES:  6/20>>> sent from nursing home to the emergency department to alter mental status, found to be hypotensive with systolic in the 70s emergency department, temperature 101.3. Chest x-ray consistent with right-sided pneumonia  LINES / TUBES: Right internal jugular CVL 6/20>> Foley catheter 6/20>> PEG tube >>> Colostomy bag >>  CULTURES: Blood cultures 6/20>>> Urine cultures 6/20>> Respiratory cultures 6/20>>  ANTIBIOTICS: Vancomycin 6/1>>> Ertapenem 6/1>> Fortaz 6/20>>  HISTORY OF PRESENT ILLNESS:  77 year-old male who is a nursing home resident, recently admitted to our facility for Klebsiella bacteremia probably due to chronic pelvic osteomyelitis. Patient has prior history of spine surgeries and is bedbound with chronic sacral ulcers which led him to develop osteomyelitis. Has been on vancomycin and ertapenem for approximately 20 days now for treatment of his osteomyelitis as per ID recommendations. Today he was sent to interventional radiology to for evaluation of possible PICC line exchange due to malfunction, while in interventional radiology he was found to be agitated which prevented them from changing the PICC line. He was sent back to the nursing home where he was found to be lethargic and hypotensive. At this point he was sent to the emergency department he was found to be lethargic as well but a systolic blood pressure in the 70s to 86. He was resuscitated with 1 L of crystalloid with improvement of his blood pressure to 130s. After resuscitation his mentation also improved but  appear lethargic. Chest x-ray done in the emergency department was consistent with right-sided infiltrates, as was found to have leukocytosis.  Upon questioning he states that he has had a cough for the past week productive of greenish sputum. Denies any fevers but states that he has felt chilly. No nausea vomiting or diarrhea.  PAST MEDICAL HISTORY :  Past Medical History  Diagnosis Date  . Stroke     Hx of with right hemiparesis  . Hypertension   . Endocarditis, mitral valve, syphilitic   . Hypothyroidism   . Anorexia   . Dysphagia   . Reflux   . Bacteremia   . Clostridium difficile colitis   . CVA (cerebral vascular accident)   . Pressure ulcer   . Diabetes mellitus, type 2   . Anemia   . Stroke    Past Surgical History  Procedure Laterality Date  . Colostomy    . Peg placement    . Hernia repair     Prior to Admission medications   Medication Sig Start Date End Date Taking? Authorizing Provider  acetaminophen (TYLENOL) 160 MG/5ML liquid Give 640 mg by tube every 4 (four) hours as needed for fever.    Yes Historical Provider, MD  citalopram (CELEXA) 10 MG tablet 10 mg by PEG Tube route every morning.    Yes Historical Provider, MD  feeding supplement (PRO-STAT SUGAR FREE 64) LIQD Place 30 mLs into feeding tube 3 (three) times daily with meals. 05/06/12  Yes Vassie Loll, MD  ferrous sulfate 300 (60 FE) MG/5ML syrup Give 300 mg by tube 2 (two) times daily.     Yes Historical Provider, MD  gabapentin (NEURONTIN) 300 MG capsule Give 300 mg  by tube at bedtime.    Yes Historical Provider, MD  glycopyrrolate (ROBINUL) 1 MG tablet 1 mg by PEG Tube route 3 (three) times daily.   Yes Historical Provider, MD  insulin aspart (NOVOLOG) 100 UNIT/ML injection Inject 5 Units into the skin 3 (three) times daily before meals. For cbg > 250   Yes Historical Provider, MD  insulin glargine (LANTUS) 100 UNIT/ML injection Inject 20 Units into the skin at bedtime. 05/06/12  Yes Vassie Loll, MD   levothyroxine (SYNTHROID, LEVOTHROID) 125 MCG tablet Give 125 mcg by tube every morning.  05/06/12  Yes Vassie Loll, MD  liothyronine (CYTOMEL) 25 MCG tablet 25 mcg by PEG Tube route every morning.    Yes Historical Provider, MD  metoprolol tartrate (LOPRESSOR) 25 MG tablet Give 25 mg by tube 2 (two) times daily.    Yes Historical Provider, MD  Multiple Vitamins-Minerals (MULTIVITAMINS THER. W/MINERALS) TABS 1 tablet by PEG Tube route every morning.    Yes Historical Provider, MD  potassium chloride 20 MEQ/15ML (10%) solution Give 20 mEq by tube every morning.    Yes Historical Provider, MD  scopolamine (TRANSDERM-SCOP) 1.5 MG Place 1 patch onto the skin every 3 (three) days.   Yes Historical Provider, MD  sodium chloride 0.9 % SOLN 50 mL with ertapenem 1 G SOLR 1 g Inject 1 g into the vein daily. For 5 more weeks, must follow with infectious disease physician within a week and get antibiotics adjusted. 09/23/12  Yes Leroy Sea, MD  Thiamine HCl (VITAMIN B-1) 100 MG tablet 100 mg by PEG Tube route every morning.    Yes Historical Provider, MD  traMADol (ULTRAM) 50 MG tablet 50 mg by PEG Tube route every 6 (six) hours as needed for pain. 05/06/12  Yes Vassie Loll, MD  traZODone (DESYREL) 50 MG tablet Give 25 mg by tube at bedtime. For insomnia   Yes Historical Provider, MD  vancomycin (VANCOCIN) 1 GM/200ML SOLN Inject 200 mLs (1,000 mg total) into the vein every 8 (eight) hours. For 5 more weeks, must see infectious disease doctor within one week and get antibiotics adjusted. 09/23/12  Yes Leroy Sea, MD  vitamin C (ASCORBIC ACID) 500 MG tablet Give 500 mg by tube 2 (two) times daily.    Yes Historical Provider, MD   No Known Allergies  FAMILY HISTORY:  Family History  Problem Relation Age of Onset  . Cancer Mother   . Other Father     old age after a fall  . Alzheimer's disease Sister    SOCIAL HISTORY:  reports that he has never smoked. He does not have any smokeless tobacco  history on file. He reports that he does not drink alcohol or use illicit drugs.  REVIEW OF SYSTEMS:   Positives in BOLD  Constitutional: Negative for fever, chills, weight loss, malaise/fatigue and diaphoresis.  HENT: Negative for hearing loss, ear pain, nosebleeds, congestion, sore throat, neck pain, tinnitus and ear discharge.  Eyes: Negative for blurred vision, double vision, photophobia, pain, discharge and redness.  Respiratory: cough, hemoptysis, sputum production, shortness of breath, wheezing and no stridor.  Cardiovascular: Negative for chest pain, palpitations, orthopnea, claudication, leg swelling and PND.  Gastrointestinal: Negative for heartburn, nausea, vomiting, abdominal pain, diarrhea, constipation, blood in stool and melena.  Genitourinary: Negative for dysuria, urgency, frequency, hematuria and flank pain.  Musculoskeletal: Negative for myalgias, back pain, joint pain and falls.  Skin: Negative for itching and rash.  Neurological: Negative for dizziness, tingling, tremors, sensory  change, speech change, focal weakness, seizures, loss of consciousness, weakness and headaches.  Endo/Heme/Allergies: Negative for environmental allergies and polydipsia. Does not bruise/bleed easily  SUBJECTIVE:   VITAL SIGNS: Temp:  [98 F (36.7 C)] 98 F (36.7 C) (06/20 1246) Pulse Rate:  [72-102] 93 (06/20 1945) Resp:  [18-26] 19 (06/20 1945) BP: (77-162)/(50-90) 150/73 mmHg (06/20 1945) SpO2:  [95 %-100 %] 100 % (06/20 1945) HEMODYNAMICS:   VENTILATOR SETTINGS:   INTAKE / OUTPUT: Intake/Output   None     PHYSICAL EXAMINATION: General: Lethargic but arousable, chronically ill-appearing  Head: Eyes PERRLA, No xanthomas. Normal cephalic and atramatic  Lungs: Bilateral rhonchi more right than left. Adequate work of breathing.  Heart: HRRR S1 S2 Pulses are 2+ & equal.  No carotid bruit. No JVD. No abdominal bruits. No femoral bruits.  Abdomen: Bowel sounds are positive, abdomen  soft and non-tender without masses, PEG site without signs of infection, positive colostomy bag. Extremities: No clubbing, cyanosis or edema. DP +1  Neuro: Alert and oriented X 3. 3/5 motor strength in bilateral upper and lower extremities (chronic). Pupils are equal and reactive, external ocular movements are intact, tongue is midline,    LABS:  Recent Labs Lab 10/03/12 1720 10/03/12 1850  HGB 10.9*  --   WBC 12.6*  --   PLT 208  --   NA 135  --   K 4.2  --   CL 101  --   CO2 26  --   GLUCOSE 194*  --   BUN 28*  --   CREATININE 1.43*  --   CALCIUM 9.5  --   AST 23  --   ALT 16  --   ALKPHOS 93  --   BILITOT 0.7  --   PROT 7.5  --   ALBUMIN 2.4*  --   LATICACIDVEN  --  2.97*    Recent Labs Lab 10/03/12 0824 10/03/12 1728  GLUCAP 165* 164*    CXR: Right IJ catheter in place, positive pacemaker, right-sided infiltrates (right upper and middle lobe)  ASSESSMENT / PLAN:  PULMONARY A: Health care associated pneumonia, possible aspiration P:   -Maintain the head of the bed elevated -Will add ceftazidime to vancomycin and ertapenem to cover for Pseudomonas signs reveal his prior blood cultures revealed a Klebsiella was not sensitive to ceftaz edema he was previously treated with Zosyn as well -Followup blood and respiratory cultures -Monitor respiratory status -He appears lethargic but is able to be aroused to verbal commands.  respiratory decompensation may require intubation -Risk of aspiration due to lethargy but  Currently mentating and able to protect airway.  CARDIOVASCULAR A: Hypotension, likely severe sepsis in the setting of health care associated pneumonia and chronic os my P:  -Blood pressure improved after resuscitation with crystalloids. -We'll continue hydration for now -Monitor hemodynamics and CVP -Monitor urine output and mentation -Recent echo on June 4 revealed EF of 55-60%  RENAL A:  Acute kidney injury, likely prerenal P:   -IV  hydration -Foley changed, monitor urine output and renal function -Avoid nephrotoxic medications and renal he does all meds  GASTROINTESTINAL A:  Status post PEG placement due to dysphagia P:   -tube feeds -GI prophylaxis  HEMATOLOGIC A:  Leukocytosis, normocytic anemia P:  -Acidosis likely related to sepsis  do healthcare associated pneumonia -Continue to monitor white count -Anemia appears stable, likely of chronic disease. We'll transfuse only if hemoglobin below 7.0. No active signs of bleeding  INFECTIOUS A:  Severe sepsis  likely 2 to healthcare associated pneumonia, sacral osteomyelitis P:   -Continue vancomycin meropenem as advised by infectious disease for Klebsiella osteomyelitis. Per infectious disease recommendation vancomycin and ertapenem are to be continued until July 12. -We'll add Elita Quick to cover for pseudomonas.  -Removal PICC line, Foley was changed in the emergency department. -Infectious disease consult -Followup cultures -Continue IV hydration, monitor CVP and central venous oxygen saturation -Currently he is hemodynamically adequate after IV resuscitation. No pressor requirements at this time. -He is critically ill at this point  ENDOCRINE A:  Hypothyroidism, diabetes P:   -Continue thyroid supplementation -Monitor serum glucose and use supplemental insulin to obtain glycemic control  NEUROLOGIC A:  Encephalopathy likely secondary to sepsis P:   -Currently responsive to verbal command able to have a conversation recognizes family members sitting at bedside. -We'll continue to monitor mentation  TODAY'S SUMMARY: Severe sepsis, healthcare associated pneumonia and osteomyelitis, on broad-spectrum antibiotics cultures were sent no pressor requirement at this time will need infectious disease consult in the morning.  I have personally obtained a history, examined the patient, evaluated laboratory and imaging results, formulated the assessment and plan and  placed orders. CRITICAL CARE: The patient is critically ill with multiple organ systems failure and requires high complexity decision making for assessment and support, frequent evaluation and titration of therapies, application of advanced monitoring technologies and extensive interpretation of multiple databases. Critical Care Time devoted to patient care services described in this note is 45 minutes.    Pulmonary and Critical Care Medicine [facility] Pager: [phone]  10/03/2012, 7:50 PM

## 2012-10-03 NOTE — Progress Notes (Signed)
Patient ID: [name], male   DOB: Feb 15, 1936, 77 y.o.   MRN: [id number]    PCP: Catheryn Bacon, MD  Code Status: full code  No Known Allergies  Chief Complaint: re-admit post hospital admission 09/13/12- 09/23/12  HPI:  77 y/o male patient is a long term resident of the facility and was admitted to hospital on above date with sepsis. He has history of CVA with residual right sided hemiparesis, mitral valve endocarditis and dysphagia among other problems and is under total care. He has a large coccygeal decubitus ulcer. This admission his sepsis was thought to be from klebsiella uti and sacral osteomyelitis. ID and general surgery were consutled, sacral fluid aspiration was done by IR on 09/22/12 and he was placed on vancomycin. He has been sent back to the facility. He was seen in his room. He is able to provide one word answer. He denies any pain. He continues to have increased secretion, is bed bound and under total care. His picc line is not flushing. Review of Systems  Constitutional: Negative for fever, chills and diaphoresis.  HENT: Negative for congestion.   Eyes: Negative for blurred vision.  Respiratory: Positive for sputum production. Negative for shortness of breath.   Cardiovascular: Negative for chest pain and palpitations.  Gastrointestinal: Negative for heartburn, nausea, vomiting and abdominal pain.       Has colostomy bag  Genitourinary:       Has chronic indwelling foley catheter  Skin: Negative for rash.  Neurological: Positive for weakness. Negative for dizziness and headaches.     Past Medical History  Diagnosis Date  . Stroke     Hx of with right hemiparesis  . Hypertension   . Endocarditis, mitral valve, syphilitic   . Hypothyroidism   . Anorexia   . Dysphagia   . Reflux   . Bacteremia   . Clostridium difficile colitis   . CVA (cerebral vascular accident)   . Pressure ulcer   . Diabetes mellitus, type 2   . Anemia   . Stroke    Past  Surgical History  Procedure Laterality Date  . Colostomy    . Peg placement    . Hernia repair     Social History:   reports that he has never smoked. He does not have any smokeless tobacco history on file. He reports that he does not drink alcohol or use illicit drugs.  Family History  Problem Relation Age of Onset  . Cancer Mother   . Other Father     old age after a fall  . Alzheimer's disease Sister     Medications: Patient's Medications  New Prescriptions   No medications on file  Previous Medications   ACETAMINOPHEN (TYLENOL) 160 MG/5ML LIQUID    Take 640 mg by mouth every 4 (four) hours as needed for fever.   CITALOPRAM (CELEXA) 10 MG TABLET    10 mg by PEG Tube route every morning.    ERTAPENEM (INVANZ) 1 G SOLR    Inject 1 g into the vein daily.   FEEDING SUPPLEMENT (PRO-STAT SUGAR FREE 64) LIQD    Place 30 mLs into feeding tube 3 (three) times daily with meals.   FERROUS SULFATE 300 (60 FE) MG/5ML SYRUP    Give 300 mg by tube 2 (two) times daily.     GABAPENTIN (NEURONTIN) 300 MG CAPSULE    Give 300 mg by tube at bedtime.    GLYCOPYRROLATE (ROBINUL) 1 MG TABLET    1  mg by PEG Tube route 3 (three) times daily.   INSULIN ASPART (NOVOLOG) 100 UNIT/ML INJECTION    Inject 5 Units into the skin 3 (three) times daily before meals. For cbg > 250   INSULIN GLARGINE (LANTUS) 100 UNIT/ML INJECTION    Inject 20 Units into the skin at bedtime.   LEVOTHYROXINE (SYNTHROID, LEVOTHROID) 125 MCG TABLET    Take 125 mcg by mouth every morning.   LIOTHYRONINE (CYTOMEL) 25 MCG TABLET    25 mcg by PEG Tube route every morning.    METOPROLOL TARTRATE (LOPRESSOR) 25 MG TABLET    Give 25 mg by tube 2 (two) times daily.    MULTIPLE VITAMINS-MINERALS (MULTIVITAMINS THER. W/MINERALS) TABS    1 tablet by PEG Tube route every morning.    POTASSIUM CHLORIDE 20 MEQ/15ML (10%) SOLUTION    Give 20 mEq by tube every morning.    SCOPOLAMINE (TRANSDERM-SCOP) 1.5 MG    Place 1 patch onto the skin every 3  (three) days.   SODIUM CHLORIDE 0.9 % SOLN 50 ML WITH ERTAPENEM 1 G SOLR 1 G    Inject 1 g into the vein daily. For 5 more weeks, must follow with infectious disease physician within a week and get antibiotics adjusted.   THIAMINE HCL (VITAMIN B-1) 100 MG TABLET    100 mg by PEG Tube route every morning.    TRAMADOL (ULTRAM) 50 MG TABLET    50 mg by PEG Tube route every 6 (six) hours as needed for pain.   TRAZODONE (DESYREL) 50 MG TABLET    Take 25 mg by mouth at bedtime. For insomnia   VANCOMYCIN (VANCOCIN) 1 GM/200ML SOLN    Inject 200 mLs (1,000 mg total) into the vein every 8 (eight) hours. For 5 more weeks, must see infectious disease doctor within one week and get antibiotics adjusted.   VITAMIN C (ASCORBIC ACID) 500 MG TABLET    Give 500 mg by tube 2 (two) times daily.   Modified Medications   No medications on file  Discontinued Medications   No medications on file    Physical Exam: Filed Vitals:   10/03/12 1246  BP: 143/90  Pulse: 85  Temp: 98 F (36.7 C)  Resp: 18   gen- frail, elderly male in NAD HEENT- no pallor, secretions in his mouth, no palpable lymph nodes cvs- n s1, s2, rrr respi- b/l decreased air entry abdo- bs+, soft, colostomy site clean, bag is full and needs emptying, indwelling foley draining urine, peg tube site clean Ext- right sided hemiparesis, able to move left side, has floaters in both heels, LUE picc line in place and site clean Psych- flat affect, one word answers Skin- stage 3 pressure ulcer in the coccyx   Labs reviewed: Basic Metabolic Panel:  Recent Labs  52/84/13 0455 09/20/12 0801 09/22/12 0435  NA 140 140 136  K 3.3* 4.1 3.7  CL 108 109 106  CO2 21 22 20   GLUCOSE 118* 105* 92  BUN 24* 26* 15  CREATININE 0.83 0.89 0.70  CALCIUM 9.2 9.2 9.2  MG  --  2.3  --    Liver Function Tests:  Recent Labs  05/02/12 0455 09/13/12 1556 09/14/12 0540  AST 27 18 20   ALT 19 10 11   ALKPHOS 95 83 80  BILITOT 0.3 0.3 0.3  PROT 7.5 8.6*  8.3  ALBUMIN 1.9* 2.1* 2.2*    Recent Labs  09/13/12 1556  LIPASE 28   No results found for this basename:  AMMONIA,  in the last 8760 hours CBC:  Recent Labs  05/02/12 0455  09/13/12 1556  09/18/12 0455 09/20/12 0801 09/22/12 0435  WBC 4.3  < > 21.7*  < > 4.1 5.2 5.9  NEUTROABS 2.8  --  17.9*  --   --  2.3  --   HGB 7.7*  < > 10.9*  < > 9.2* 9.7* 9.8*  HCT 24.7*  < > 33.3*  < > 30.0* 30.1* 31.2*  MCV 77.7*  < > 77.6*  < > 76.7* 78.4 78.2  PLT 291  < > 351  < > 303 325 347  < > = values in this interval not displayed. Cardiac Enzymes:  Recent Labs  04/30/12 1805  TROPONINI <0.30   BNP: No components found with this basename: POCBNP,  CBG:  Recent Labs  09/23/12 0619 09/23/12 0808 10/03/12 0824  GLUCAP 129* 110* 165*    Radiological Exams: Reviewed, see in discharge summary and imaging section for full details  Assessment/Plan  Sacral osteomyelitis- complete course of vancomycin and invanz of total 6 and 5 weeks respectively  for now. Follow vanc trough and adjust dosing. To follow with ID. Monitor wbc and temp curve as well  HTN- continue lopressor, monitor bp  Sacral pressure ulcer- followed by wound care. To complete course of antibiotic. Continue care for foley, colsotomy site, PICC line site and peg tube site. Continue tyleonol prn, tramadol and gabapentin. continue folic acid and vit c supplement. With his PICC line not working, will send for picc line replacement  Dysphagia- has a peg tube. Currently on thin liquids trial and mechanical soft diet with aspiration precautions. Continues with secretions. Will continue scopolamine patch. Continue diabetic source bolus tid for po intake , 50% for now. Continue prostat supplement  DM type 2- continue current regimen of lantus and SSI, recent a1c 8.7, monitor cbg. Continue gabapentin for peripheral neuropathy  Hypothyroidism- monitor tsh, continue levothyroxine and liothyronine for now  Depression- continue  celexa and trazodone, monitor  Anemia- continue ferrous sulfate and monitor cbc  Family/ staff Communication: reveiwed care plan with patient and nursing supervisor   Labs/tests ordered- cbc, cmp, vancomycin trough

## 2012-10-03 NOTE — ED Notes (Signed)
Called report to [place], RN unit 2100.

## 2012-10-03 NOTE — ED Notes (Signed)
[place], MD notified of abnormal lab test results 

## 2012-10-03 NOTE — ED Notes (Signed)
CBG 164  

## 2012-10-04 DIAGNOSIS — M869 Osteomyelitis, unspecified: Secondary | ICD-10-CM

## 2012-10-04 DIAGNOSIS — J690 Pneumonitis due to inhalation of food and vomit: Secondary | ICD-10-CM

## 2012-10-04 DIAGNOSIS — R4182 Altered mental status, unspecified: Secondary | ICD-10-CM

## 2012-10-04 DIAGNOSIS — L8993 Pressure ulcer of unspecified site, stage 3: Secondary | ICD-10-CM

## 2012-10-04 DIAGNOSIS — E1351 Other specified diabetes mellitus with diabetic peripheral angiopathy without gangrene: Secondary | ICD-10-CM

## 2012-10-04 DIAGNOSIS — L89109 Pressure ulcer of unspecified part of back, unspecified stage: Secondary | ICD-10-CM

## 2012-10-04 DIAGNOSIS — J189 Pneumonia, unspecified organism: Secondary | ICD-10-CM

## 2012-10-04 DIAGNOSIS — A419 Sepsis, unspecified organism: Principal | ICD-10-CM

## 2012-10-04 DIAGNOSIS — N179 Acute kidney failure, unspecified: Secondary | ICD-10-CM

## 2012-10-04 DIAGNOSIS — G9341 Metabolic encephalopathy: Secondary | ICD-10-CM

## 2012-10-04 DIAGNOSIS — I798 Other disorders of arteries, arterioles and capillaries in diseases classified elsewhere: Secondary | ICD-10-CM

## 2012-10-04 LAB — PHOSPHORUS: Phosphorus: 2.3 mg/dL (ref 2.3–4.6)

## 2012-10-04 LAB — URINE CULTURE: Colony Count: NO GROWTH

## 2012-10-04 LAB — GLUCOSE, CAPILLARY
Glucose-Capillary: 102 mg/dL — ABNORMAL HIGH (ref 70–99)
Glucose-Capillary: 106 mg/dL — ABNORMAL HIGH (ref 70–99)
Glucose-Capillary: 122 mg/dL — ABNORMAL HIGH (ref 70–99)
Glucose-Capillary: 137 mg/dL — ABNORMAL HIGH (ref 70–99)
Glucose-Capillary: 172 mg/dL — ABNORMAL HIGH (ref 70–99)

## 2012-10-04 LAB — MAGNESIUM: Magnesium: 1.8 mg/dL (ref 1.5–2.5)

## 2012-10-04 LAB — BASIC METABOLIC PANEL
BUN: 28 mg/dL — ABNORMAL HIGH (ref 6–23)
GFR calc Af Amer: 68 mL/min — ABNORMAL LOW (ref >90)
GFR calc non Af Amer: 59 mL/min — ABNORMAL LOW (ref >90)
Potassium: 4 mEq/L (ref 3.5–5.1)
Sodium: 139 mEq/L (ref 135–145)

## 2012-10-04 LAB — PROCALCITONIN: Procalcitonin: 48.5 ng/mL

## 2012-10-04 LAB — VANCOMYCIN, RANDOM: Vancomycin Rm: 24.2 ug/mL

## 2012-10-04 LAB — CBC
MCHC: 31.4 g/dL (ref 30.0–36.0)
Platelets: 150 10*3/uL (ref 150–400)
RDW: 19.1 % — ABNORMAL HIGH (ref 11.5–15.5)
WBC: 12.3 10*3/uL — ABNORMAL HIGH (ref 4.0–10.5)

## 2012-10-04 LAB — LACTIC ACID, PLASMA: Lactic Acid, Venous: 1 mmol/L (ref 0.5–2.2)

## 2012-10-04 MED ORDER — GLUCERNA 1.2 CAL PO LIQD
1000.0000 mL | ORAL | Status: DC
  Administered 2012-10-04 – 2012-10-07 (×5): 1000 mL
  Filled 2012-10-04 (×12): qty 1000

## 2012-10-04 MED ORDER — SODIUM CHLORIDE 0.9 % IJ SOLN
10.0000 mL | INTRAMUSCULAR | Status: DC | PRN
  Administered 2012-10-06 – 2012-10-07 (×2): 20 mL

## 2012-10-04 MED ORDER — BIOTENE DRY MOUTH MT LIQD
15.0000 mL | Freq: Two times a day (BID) | OROMUCOSAL | Status: DC
  Administered 2012-10-04 – 2012-10-07 (×6): 15 mL via OROMUCOSAL

## 2012-10-04 MED ORDER — PRO-STAT SUGAR FREE PO LIQD
30.0000 mL | Freq: Two times a day (BID) | ORAL | Status: DC
  Administered 2012-10-04 – 2012-10-07 (×7): 30 mL
  Filled 2012-10-04 (×8): qty 30

## 2012-10-04 MED ORDER — CHLORHEXIDINE GLUCONATE 0.12 % MT SOLN
15.0000 mL | Freq: Two times a day (BID) | OROMUCOSAL | Status: DC
  Administered 2012-10-04 – 2012-10-06 (×6): 15 mL via OROMUCOSAL
  Filled 2012-10-04 (×9): qty 15

## 2012-10-04 MED ORDER — VANCOMYCIN HCL 10 G IV SOLR
1500.0000 mg | INTRAVENOUS | Status: DC
  Administered 2012-10-04 – 2012-10-06 (×3): 1500 mg via INTRAVENOUS
  Filled 2012-10-04 (×4): qty 1500

## 2012-10-04 NOTE — Progress Notes (Signed)
ANTIBIOTIC CONSULT NOTE - INITIAL  Pharmacy Consult for vancomycin, ertapenem, fortaz Indication: Klebsiella bacteremia, chronic pelvic osteomyelitis, severe sepsis, possible HCAP  No Known Allergies  Patient Measurements: Height: 6' 2.02" (188 cm) Weight: 207 lb 3.7 oz (94 kg) IBW/kg (Calculated) : 82.24   Vital Signs: Temp: 98.9 F (37.2 C) (06/21 1300) Temp src: Core (Comment) (06/21 0400) BP: 111/61 mmHg (06/21 1300) Pulse Rate: 88 (06/21 1300) Intake/Output from previous day: 06/20 0701 - 06/21 0700 In: 1660 [I.V.:1350; IV Piggyback:100] Out: 770 [Urine:370; Stool:400] Intake/Output from this shift: Total I/O In: 1170 [I.V.:900; Other:270] Out: -   Labs:  Recent Labs  10/03/12 1720 10/03/12 2230 10/04/12 0420  WBC 12.6* 14.5* 12.3*  HGB 10.9* 10.1* 9.0*  PLT 208 190 150  CREATININE 1.43* 1.18 1.18   Estimated Creatinine Clearance: 62.9 ml/min (by C-G formula based on Cr of 1.18).  Recent Labs  10/04/12 0420  [facility] 24.2     Microbiology: Recent Results (from the past 720 hour(s))  CULTURE, BLOOD (ROUTINE X 2)     Status: None   Collection Time    09/13/12  3:56 PM      Result Value Range Status   Specimen Description BLOOD RIGHT HAND   Final   Special Requests BOTTLES DRAWN AEROBIC AND ANAEROBIC 5CC   Final   Culture  Setup Time 09/13/2012 19:45   Final   Culture     Final   Value: KLEBSIELLA PNEUMONIAE     Note: Confirmed Extended Spectrum Beta-Lactamase Producer (ESBL) CRITICAL RESULT CALLED TO, READ BACK BY AND VERIFIED WITH: CYNTHIA WHITE 09/16/12 0950 BY SMITHERSJ     Note: Gram Stain Report Called to,Read Back By and Verified With: Gertie Gowda 09/14/12 @ 1:52PM BY RUSCA.   Report Status 09/16/2012 FINAL   Final   Organism ID, Bacteria KLEBSIELLA PNEUMONIAE   Final  CULTURE, BLOOD (ROUTINE X 2)     Status: None   Collection Time    09/13/12  4:41 PM      Result Value Range Status   Specimen Description BLOOD RIGHT HAND   Final   Special  Requests BOTTLES DRAWN AEROBIC ONLY 3CC   Final   Culture  Setup Time 09/13/2012 19:45   Final   Culture NO GROWTH 5 DAYS   Final   Report Status 09/19/2012 FINAL   Final  URINE CULTURE     Status: None   Collection Time    09/13/12  6:02 PM      Result Value Range Status   Specimen Description URINE, CATHETERIZED   Final   Special Requests NONE   Final   Culture  Setup Time 09/13/2012 19:48   Final   Colony Count >=100,000 COLONIES/ML   Final   Culture     Final   Value: Multiple bacterial morphotypes present, none predominant. Suggest appropriate recollection if clinically indicated.   Report Status 09/14/2012 FINAL   Final  URINE CULTURE     Status: None   Collection Time    09/17/12  3:19 PM      Result Value Range Status   Specimen Description URINE, CATHETERIZED   Final   Special Requests NONE   Final   Culture  Setup Time 09/18/2012 01:14   Final   Colony Count NO GROWTH   Final   Culture NO GROWTH   Final   Report Status 09/19/2012 FINAL   Final  CULTURE, BLOOD (ROUTINE X 2)     Status: None   Collection Time  09/17/12  4:15 PM      Result Value Range Status   Specimen Description BLOOD RIGHT HAND   Final   Special Requests BOTTLES DRAWN AEROBIC ONLY 3CC   Final   Culture  Setup Time 09/18/2012 00:16   Final   Culture NO GROWTH 5 DAYS   Final   Report Status 09/24/2012 FINAL   Final  CULTURE, BLOOD (ROUTINE X 2)     Status: None   Collection Time    09/17/12  4:24 PM      Result Value Range Status   Specimen Description BLOOD LEFT HAND   Final   Special Requests BOTTLES DRAWN AEROBIC ONLY 3CC   Final   Culture  Setup Time 09/18/2012 00:16   Final   Culture NO GROWTH 5 DAYS   Final   Report Status 09/24/2012 FINAL   Final  CULTURE, ROUTINE-ABSCESS     Status: None   Collection Time    09/22/12 11:51 AM      Result Value Range Status   Specimen Description ABSCESS LT PELVIC   Final   Special Requests Normal   Final   Gram Stain     Final   Value: RARE WBC  PRESENT,BOTH PMN AND MONONUCLEAR     NO SQUAMOUS EPITHELIAL CELLS SEEN     NO ORGANISMS SEEN   Culture NO GROWTH 3 DAYS   Final   Report Status 09/25/2012 FINAL   Final  MRSA PCR SCREENING     Status: Abnormal   Collection Time    10/03/12  9:54 PM      Result Value Range Status   MRSA by PCR POSITIVE (*) NEGATIVE Final   Comment:            The GeneXpert MRSA Assay (FDA     approved for NASAL specimens     only), is one component of a     comprehensive MRSA colonization     surveillance program. It is not     intended to diagnose MRSA     infection nor to guide or     monitor treatment for     20     MRSA infections.     RESULT CALLED TO, READ BACK BY AND VERIFIED WITH:     TO C. PORTER (RN) BY L.LOMAX AT 2353 ON 6     2014    Medical History: Past Medical History  Diagnosis Date  . Stroke     Hx of with right hemiparesis  . Hypertension   . Endocarditis, mitral valve, syphilitic   . Hypothyroidism   . Anorexia   . Dysphagia   . Reflux   . Bacteremia   . Clostridium difficile colitis   . CVA (cerebral vascular accident)   . Pressure ulcer   . Diabetes mellitus, type 2   . Anemia   . Stroke     Medications:  Prescriptions prior to admission  Medication Sig Dispense Refill  . acetaminophen (TYLENOL) 160 MG/5ML liquid Give 640 mg by tube every 4 (four) hours as needed for fever.       . citalopram (CELEXA) 10 MG tablet 10 mg by PEG Tube route every morning.       . feeding supplement (PRO-STAT SUGAR FREE 64) LIQD Place 30 mLs into feeding tube 3 (three) times daily with meals.      . ferrous sulfate 300 (60 FE) MG/5ML syrup Give 300 mg by tube 2 (two) times daily.        Marland Kitchen  gabapentin (NEURONTIN) 300 MG capsule Give 300 mg by tube at bedtime.       Marland Kitchen glycopyrrolate (ROBINUL) 1 MG tablet 1 mg by PEG Tube route 3 (three) times daily.      . insulin aspart (NOVOLOG) 100 UNIT/ML injection Inject 5 Units into the skin 3 (three) times daily before meals. For cbg > 250       . insulin glargine (LANTUS) 100 UNIT/ML injection Inject 20 Units into the skin at bedtime.      Marland Kitchen levothyroxine (SYNTHROID, LEVOTHROID) 125 MCG tablet Give 125 mcg by tube every morning.       Marland Kitchen liothyronine (CYTOMEL) 25 MCG tablet 25 mcg by PEG Tube route every morning.       . metoprolol tartrate (LOPRESSOR) 25 MG tablet Give 25 mg by tube 2 (two) times daily.       . Multiple Vitamins-Minerals (MULTIVITAMINS THER. W/MINERALS) TABS 1 tablet by PEG Tube route every morning.       . potassium chloride 20 MEQ/15ML (10%) solution Give 20 mEq by tube every morning.       Marland Kitchen scopolamine (TRANSDERM-SCOP) 1.5 MG Place 1 patch onto the skin every 3 (three) days.      . sodium chloride 0.9 % SOLN 50 mL with ertapenem 1 G SOLR 1 g Inject 1 g into the vein daily. For 5 more weeks, must follow with infectious disease physician within a week and get antibiotics adjusted.      . Thiamine HCl (VITAMIN B-1) 100 MG tablet 100 mg by PEG Tube route every morning.       . traMADol (ULTRAM) 50 MG tablet 50 mg by PEG Tube route every 6 (six) hours as needed for pain.      . traZODone (DESYREL) 50 MG tablet Give 25 mg by tube at bedtime. For insomnia      . vancomycin (VANCOCIN) 1 GM/200ML SOLN Inject 200 mLs (1,000 mg total) into the vein every 8 (eight) hours. For 5 more weeks, must see infectious disease doctor within one week and get antibiotics adjusted.  4000 mL    . vitamin C (ASCORBIC ACID) 500 MG tablet Give 500 mg by tube 2 (two) times daily.        Assessment: 77 yo M admitted from NH s/p recent admit to [facility] for Klebsiella bacteremia (R-zosyn) likely 2/2 chronic pelvic osteomyelitis.  Has been receiving vancomycin since 5/31 and ertapenem since 6/4, as per ID rec's.  Also noted to have received 1500 mg IV vancomycin in ED 6/20 at 1936.  Vancomycin random level this am reported as 24.2 mcg/mL at  0420.    Per nursing home and previous hospital records, vanc dosing as enumerated below:  [facility] 5/31: Vanc 1g  IV x1; SCr 1.7 6/1 - 6/2: Vanc 1250 q24h, SCr 1.44 6/3-6/8: Vanc d/c'd 6/9 - 6/10: Vanc 1g IV q8h, SCr 0.7  [facility] 6/10-6/14: discharged on 1g IV q8h to [facility]  6/14: Vanc level = 46 mcg/mL at 0713, SCr 1.05 6/16 @ 1400 Vanc level=17.89, SCr 0.9 6/16-6/17: Vanc changed to 1500 mg IV q24h, given at midnight, with orders ton continue until 10/28/12 6/18-6/19: doses NOT charted  [facility] 6/20: Vanc IV 1500 mg x 1 in ED at 1936   Goal of Therapy:  Vancomycin trough level 15-20 mcg/ml  Plan:  - Vancomycin 1500 mg IV q24h (next dose due 2000) - Continue Ertapenem 1g IV q24h - Continue Fortaz 1g IV q8h - Follow up SCr,  UOP, cultures, clinical course and adjust as clinically indicated - f/u Vanc trough at C[facility] (due 6/23 at 1930, not ordered yet)  Jill Side L. Illene Bolus, PharmD, BCPS Clinical Pharmacist Pager: [phone] Pharmacy: [phone] 10/04/2012 2:03 PM

## 2012-10-04 NOTE — Progress Notes (Signed)
INITIAL NUTRITION ASSESSMENT  DOCUMENTATION CODES Per approved criteria  -Non-severe (moderate) malnutrition in the context of chronic illness    INTERVENTION:  Change to Glucerna 1.2 formula at goal rate of 70 ml/hr with Prostat liquid protein 30 ml twice daily to provide 2216 kcals, 131 gm protein, 1352 ml of free water RD to follow for nutrition care plan  NUTRITION DIAGNOSIS: Inadequate oral intake related to inability to eat as evidenced by NPO status  Goal: EN to meet >/= 90% of estimated nutrition needs  Monitor:  EN regimen & tolerance, weight, labs, I/O's  Reason for Assessment: Consult  77 y.o. male  Admitting Dx: Severe sepsis  ASSESSMENT: Patient admitted from nursing home [facility]) to ED due to alterted mental status; found to be hypotensive with systolic in the 70's; temperature 101.3; chest x-ray consistent with right-sided pneumonia.  Patient known to Nutritional Management from previous hospitalizations; PTA patient was on a Mechanical Soft diet ---> if consumed < 50% of meal, 250 ml bolus of Diabetisource formula was given; in addition patient received nocturnal continuous regimen of Diabetisource at 85 ml/hr x 8 hours (9pm-5am) which provides 816 calories, 41 gm protein, 556 ml free water.  Vital AF 1.2 formula initiated via Adult TF Protocol; currently infusing at 40 ml/hr via G-tube.  RD consulted for EN initiation & management.  Patient meets criteria for moderate malnutrition in the context of chronic illness as evidenced by mild/moderate muscle wasting and subcutaneous fat loss in clavicles, temporal, and orbital region.  Height: Ht Readings from Last 1 Encounters:  10/03/12 6' 2.02" (1.88 m)    Weight: Wt Readings from Last 1 Encounters:  10/04/12 207 lb 3.7 oz (94 kg)    Ideal Body Weight: 190 lb  % Ideal Body Weight: 108%  Wt Readings from Last 10 Encounters:  10/04/12 207 lb 3.7 oz (94 kg)  09/23/12 223 lb 12.3 oz (101.5 kg)   09/23/12 223 lb 12.3 oz (101.5 kg)  05/04/12 244 lb 11.4 oz (111 kg)  04/17/12 210 lb 5.5 oz (95.412 kg)  02/12/12 210 lb 1.6 oz (95.3 kg)  06/18/11 219 lb (99.338 kg)  05/22/11 210 lb (95.255 kg)  02/20/11 219 lb 12.8 oz (99.7 kg)    Usual Body Weight: 223 lb  % Usual Body Weight: 93%  BMI:  Body mass index is 26.6 kg/(m^2).  Estimated Nutritional Needs: Kcal: 2100-2300 Protein: 130-140 gm Fluid: per MD  Skin: Stage IV sacral pressure ulcer  Diet Order: NPO  EDUCATION NEEDS: -No education needs identified at this time   Intake/Output Summary (Last 24 hours) at 10/04/12 0959 Last data filed at 10/04/12 0900  Gross per 24 hour  Intake   2070 ml  Output    770 ml  Net   1300 ml    Labs:   Recent Labs Lab 10/03/12 1720 10/03/12 2230 10/04/12 0420  NA 135  --  139  K 4.2  --  4.0  CL 101  --  108  CO2 26  --  25  BUN 28*  --  28*  CREATININE 1.43* 1.18 1.18  CALCIUM 9.5  --  8.4  MG  --   --  1.8  PHOS  --   --  2.3  GLUCOSE 194*  --  112*    CBG (last 3)   Recent Labs  10/03/12 2359 10/04/12 0343 10/04/12 0825  GLUCAP 172* 118* 106*    Scheduled Meds: . antiseptic oral rinse  15 mL Mouth Rinse  q12n4p  . cefTAZidime (FORTAZ)  IV  1 g Intravenous Q8H  . chlorhexidine  15 mL Mouth Rinse BID  . ertapenem (INVANZ) IV  1 g Intravenous Q24H  . famotidine  20 mg Per Tube BID  . feeding supplement  30 mL Per Tube TID WC  . gabapentin  300 mg Per Tube QHS  . heparin  5,000 Units Subcutaneous Q8H  . insulin aspart  0-15 Units Subcutaneous Q4H  . levothyroxine  125 mcg Per Tube q morning - 10a  . liothyronine  25 mcg Per Tube q morning - 10a  . scopolamine  1 patch Transdermal Q72H    Continuous Infusions: . sodium chloride 1,000 mL (10/04/12 0446)  . feeding supplement (VITAL AF 1.2 CAL) 1,000 mL (10/03/12 2305)    Past Medical History  Diagnosis Date  . Stroke     Hx of with right hemiparesis  . Hypertension   . Endocarditis, mitral  valve, syphilitic   . Hypothyroidism   . Anorexia   . Dysphagia   . Reflux   . Bacteremia   . Clostridium difficile colitis   . CVA (cerebral vascular accident)   . Pressure ulcer   . Diabetes mellitus, type 2   . Anemia   . Stroke     Past Surgical History  Procedure Laterality Date  . Colostomy    . Peg placement    . Hernia repair      Maureen Chatters, RD, LDN Pager #: [phone] After-Hours Pager #: [phone]

## 2012-10-04 NOTE — H&P (Signed)
PULMONARY  / CRITICAL CARE MEDICINE  Name: [name] MRN: [id number] DOB: January 17, 1936    ADMISSION DATE:  10/03/2012   REFERRING MD :  Emergency department PRIMARY SERVICE: PCCM  CHIEF COMPLAINT: Altered mental status  BRIEF PATIENT DESCRIPTION: 77 year-old male nursing home resident with severe sepsis possible healthcare associated pneumonia, acute kidney injury.  SIGNIFICANT EVENTS / STUDIES:  6/20>>> sent from nursing home to the emergency department to alter mental status, found to be hypotensive with systolic in the 70s emergency department, temperature 101.3. Chest x-ray consistent with right-sided pneumonia  LINES / TUBES: Right internal jugular CVL 6/20>> Foley catheter 6/20>> PEG tube >>> Colostomy bag >>  CULTURES: Blood cultures 6/20>>> Urine cultures 6/20>> Respiratory cultures 6/20>> MRSA screen 6/20 >> POSITIVE  ANTIBIOTICS: Vancomycin 6/1>>> Ertapenem 6/1>> Elita Quick 6/20>>  SUBJECTIVE:  Denies chest pain.  Breathing okay.    VITAL SIGNS: Temp:  [98 F (36.7 C)-98.9 F (37.2 C)] 98.6 F (37 C) (06/21 1000) Pulse Rate:  [72-102] 85 (06/21 1000) Resp:  [14-26] 18 (06/21 1000) BP: (77-169)/(42-90) 106/59 mmHg (06/21 1000) SpO2:  [93 %-100 %] 100 % (06/21 1000) Weight:  [207 lb 3.7 oz (94 kg)-223 lb 12.3 oz (101.5 kg)] 207 lb 3.7 oz (94 kg) (06/21 0400) HEMODYNAMICS: CVP:  [2 mmHg-4 mmHg] 4 mmHg INTAKE / OUTPUT: Intake/Output     06/20 0701 - 06/21 0700 06/21 0701 - 06/22 0700   I.V. (mL/kg) 1350 (14.4) 450 (4.8)   Other 210 150   IV Piggyback 100    Total Intake(mL/kg) 1660 (17.7) 600 (6.4)   Urine (mL/kg/hr) 370    Stool 400    Total Output 770     Net +890 +600          PHYSICAL EXAMINATION: General: Lethargic but arousable, chronically ill-appearing  Head: Eyes PERRLA, No xanthomas. Normal cephalic and atramatic  Lungs: Bilateral rhonchi more right than left. Adequate work of breathing.  Heart: HRRR S1 S2 Pulses are 2+ & equal.   Abdomen: Bowel sounds are positive, abdomen soft and non-tender without masses, PEG site without signs of infection, positive colostomy bag. Extremities: No clubbing, cyanosis or edema. DP +1  Neuro: Alert and oriented X 3. 3/5 motor strength in bilateral upper and lower extremities (chronic).    LABS:  Recent Labs Lab 10/03/12 1720 10/03/12 1850 10/03/12 2202 10/03/12 2230 10/04/12 0402 10/04/12 0420  HGB 10.9*  --   --  10.1*  --  9.0*  WBC 12.6*  --   --  14.5*  --  12.3*  PLT 208  --   --  190  --  150  NA 135  --   --   --   --  139  K 4.2  --   --   --   --  4.0  CL 101  --   --   --   --  108  CO2 26  --   --   --   --  25  GLUCOSE 194*  --   --   --   --  112*  BUN 28*  --   --   --   --  28*  CREATININE 1.43*  --   --  1.18  --  1.18  CALCIUM 9.5  --   --   --   --  8.4  MG  --   --   --   --   --  1.8  PHOS  --   --   --   --   --  2.3  AST 23  --   --   --   --   --   ALT 16  --   --   --   --   --   ALKPHOS 93  --   --   --   --   --   BILITOT 0.7  --   --   --   --   --   PROT 7.5  --   --   --   --   --   ALBUMIN 2.4*  --   --   --   --   --   LATICACIDVEN  --  2.97* 2.3*  --  1.7  --   PROCALCITON  --   --  44.79  --   --  48.50    Recent Labs Lab 10/03/12 1728 10/03/12 2216 10/03/12 2359 10/04/12 0343 10/04/12 0825  GLUCAP 164* 171* 172* 118* 106*    Imaging: Ir Fluoro Rm 30-60 Min  10/03/2012   *RADIOLOGY REPORT*  Clinical Data: Status post placement of left upper extremity midline catheter to the level of the axillary vein for IV vancomycin administration.  The catheter could not be advanced centrally due to underlying venous stenosis.  There is inability to aspirate blood from the catheter currently.  IR FLOURO RM 0-60 MIN  Fluoro time:  3 seconds.  Comparison: Imaging during midline catheter placement on 09/22/2012.  Findings: Fluoroscopy was performed showing stable positioning of the midline catheter at the level of the rib margin on the left  within the axillary vein.  Aspiration did not yield blood.  The catheter flushed easily.  The catheter was retracted by a few centimeters and additional manipulation did not yield blood with aspiration.  During manipulation, the patient experience significant nausea and vomiting and had to be suctioned.  He could not tolerate any further manipulation or replacement of the catheter.  This catheter is clearly intravascular and can be used for vancomycin administration.  IMPRESSION: Left arm midline catheter tip is within the axillary vein.  This catheter flushed easily.  With retraction, there was persistent inability to aspirate blood, likely reflecting fibrin sheath at the tip of the catheter.  Manipulation of the catheter had to be abruptly discontinued due to development of nausea with active vomiting requiring suctioning of the oropharynx.  The catheter can continue to be used for IV administration currently.   Original Report Authenticated By: Irish Lack, M.D.   Dg Chest Portable 1 View  10/03/2012   *RADIOLOGY REPORT*  Clinical Data: Central line placement.  Sepsis.  PORTABLE CHEST - 1 VIEW 6:43 p.m.  Comparison: 10/03/2012 at 5:28 p.m.  Findings: Right jugular vein catheter has been inserted and the tip is in the superior vena cava at the cavoatrial junction in good position.  No pneumothorax.  Faint infiltrate in the right upper lobe.  Lungs otherwise clear.  Heart size and vascularity are normal.  Dual lead pacer in place.  No acute osseous abnormality.  Fairly severe arthritis of the right shoulder joint.  IMPRESSION:  1.  Central line good position with no pneumothorax. 2.  Right upper lobe infiltrate.   Original Report Authenticated By: Francene Boyers, M.D.   Dg Chest Port 1 View  10/03/2012   *RADIOLOGY REPORT*  Clinical Data: Sepsis and weakness  PORTABLE CHEST - 1 VIEW  Comparison: 09/13/2012  Findings: Right chest wall pacer device is noted with lead in the right atrial appendage and right  ventricle.  Heart size is normal. Bilateral hazy lung opacities are identified and appear upper lobe predominant. No pleural effusion identified.  Postsurgical changes within the cervical spine noted compatible with anterior and posterior fusion.  IMPRESSION:  1.  Bilateral hazy lung opacities which may indicate multifocal pneumonia or edema.   Original Report Authenticated By: Signa Kell, M.D.     ASSESSMENT / PLAN:  PULMONARY A: Health care associated pneumonia, possible aspiration P:   -Maintain the head of the bed elevated -continue Abx -Followup blood and respiratory cultures -Monitor respiratory status -He appears lethargic but is able to be aroused to verbal commands.  respiratory decompensation may require intubation -Risk of aspiration due to lethargy but  Currently mentating and able to protect airway.  CARDIOVASCULAR A: Severe sepsis in the setting of health care associated pneumonia and chronic osteomyelitis - Recent echo on June 4 revealed EF of 55-60% P:  -continue IV fluid for CVP goal > 8  RENAL A:  Acute kidney injury, likely prerenal P:   -IV hydration -Foley changed, monitor urine output and renal function -Avoid nephrotoxic medications and renal he does all meds  GASTROINTESTINAL A:  Status post PEG placement due to dysphagia P:   -tube feeds -GI prophylaxis  HEMATOLOGIC A:  Leukocytosis, normocytic anemia P:  -f/u CBC  INFECTIOUS A:  Severe sepsis likely 2 to healthcare associated pneumonia, sacral osteomyelitis >> PICC line remove, and foley changed in ED. P:   -Continue vancomycin, ertapenem as advised by infectious disease for Klebsiella osteomyelitis. Per infectious disease recommendation vancomycin and ertapenem are to be continued until July 12. -Continue Elita Quick to cover for pseudomonas.  -May need to re-consult Infectious disease -Followup cultures -wound care consult  ENDOCRINE A:  Hypothyroidism, diabetes P:   -Continue thyroid  supplementation -Monitor serum glucose and use supplemental insulin to obtain glycemic control  NEUROLOGIC A:  Encephalopathy likely secondary to sepsis P:   -Currently responsive to verbal command able to have a conversation recognizes family members sitting at bedside. -We'll continue to monitor mentation  Summary: Has some improvement, but still critically ill.  Will continue care in ICU.  CC time 35 minutes.  Coralyn Helling, MD [facility] Pulmonary/Critical Care 10/04/2012, 11:06 AM Pager:  [phone] After 3pm call: [phone]

## 2012-10-04 NOTE — Progress Notes (Signed)
[facility] ADULT ICU REPLACEMENT PROTOCOL FOR AM LAB REPLACEMENT ONLY  The patient does not apply for the [facility] Adult ICU Electrolyte Replacment Protocol based on the criteria listed below:   2. Is urine output >/= 0.5 ml/kg/hr for the last 6 hours? no Patient's UOP is 0.4 ml/kg/hr 4. Abnormal electrolyte(s): Mg 1.8   Hayden Rasmussen [facility] 10/04/2012 5:25 AM

## 2012-10-05 ENCOUNTER — Inpatient Hospital Stay (HOSPITAL_COMMUNITY): Payer: Medicare Other

## 2012-10-05 DIAGNOSIS — J9600 Acute respiratory failure, unspecified whether with hypoxia or hypercapnia: Secondary | ICD-10-CM

## 2012-10-05 LAB — CBC
MCV: 81.2 fL (ref 78.0–100.0)
Platelets: 139 10*3/uL — ABNORMAL LOW (ref 150–400)
RBC: 3.09 MIL/uL — ABNORMAL LOW (ref 4.22–5.81)
RDW: 19.1 % — ABNORMAL HIGH (ref 11.5–15.5)
WBC: 8.6 10*3/uL (ref 4.0–10.5)

## 2012-10-05 LAB — BASIC METABOLIC PANEL
CO2: 25 mEq/L (ref 19–32)
Calcium: 8.5 mg/dL (ref 8.4–10.5)
Creatinine, Ser: 0.87 mg/dL (ref 0.50–1.35)
GFR calc Af Amer: >90 mL/min (ref >90)
GFR calc non Af Amer: 82 mL/min — ABNORMAL LOW (ref >90)
Sodium: 139 mEq/L (ref 135–145)

## 2012-10-05 LAB — GLUCOSE, CAPILLARY
Glucose-Capillary: 114 mg/dL — ABNORMAL HIGH (ref 70–99)
Glucose-Capillary: 118 mg/dL — ABNORMAL HIGH (ref 70–99)
Glucose-Capillary: 98 mg/dL (ref 70–99)

## 2012-10-05 LAB — PROCALCITONIN: Procalcitonin: 27.48 ng/mL

## 2012-10-05 MED ORDER — METOPROLOL TARTRATE 25 MG/10 ML ORAL SUSPENSION
25.0000 mg | Freq: Two times a day (BID) | ORAL | Status: DC
  Administered 2012-10-05 – 2012-10-07 (×5): 25 mg
  Filled 2012-10-05 (×6): qty 10

## 2012-10-05 MED ORDER — POTASSIUM CHLORIDE 20 MEQ/15ML (10%) PO LIQD
20.0000 meq | ORAL | Status: AC
  Administered 2012-10-05 (×2): 20 meq
  Filled 2012-10-05 (×2): qty 15

## 2012-10-05 MED ORDER — CITALOPRAM HYDROBROMIDE 10 MG/5ML PO SOLN
10.0000 mg | Freq: Every day | ORAL | Status: DC
  Administered 2012-10-05 – 2012-10-07 (×3): 10 mg
  Filled 2012-10-05 (×3): qty 10

## 2012-10-05 MED ORDER — INSULIN ASPART 100 UNIT/ML ~~LOC~~ SOLN
0.0000 [IU] | SUBCUTANEOUS | Status: DC
  Administered 2012-10-05: 3 [IU] via SUBCUTANEOUS
  Administered 2012-10-06 (×5): 2 [IU] via SUBCUTANEOUS
  Administered 2012-10-07: 3 [IU] via SUBCUTANEOUS
  Administered 2012-10-07 (×2): 2 [IU] via SUBCUTANEOUS

## 2012-10-05 NOTE — ED Provider Notes (Signed)
I saw and evaluated the patient, reviewed the resident's note and I agree with the findings and plan.  Kaimani Clayson T Mikhaela Zaugg, MD 10/05/12 0632 

## 2012-10-05 NOTE — Progress Notes (Signed)
[facility] ADULT ICU REPLACEMENT PROTOCOL FOR AM LAB REPLACEMENT ONLY  The patient does apply for the [facility] Adult ICU Electrolyte Replacment Protocol based on the criteria listed below:   1. Is GFR >/= 40 ml/min? yes  Patient's GFR today is >90 2. Is urine output >/= 0.5 ml/kg/hr for the last 6 hours? yes Patient's UOP is 1.06 ml/kg/hr 3. Is BUN < 60 mg/dL? Yes Patient's BUN today is 32 5. Ordered repletion with: per protocol 4. Abnormal electrolyte(s): K 3.5 6. If a panic level lab has been reported, has the CCM MD in charge been notified? yes.   Physician:  Dr Bea Laura Deterding  [place] 10/05/2012 5:39 AM

## 2012-10-05 NOTE — H&P (Signed)
PULMONARY  / CRITICAL CARE MEDICINE  Name: [name] MRN: [id number] DOB: Sep 29, 1935    ADMISSION DATE:  10/03/2012   REFERRING MD :  Emergency department PRIMARY SERVICE: PCCM  CHIEF COMPLAINT: Altered mental status  BRIEF PATIENT DESCRIPTION: 77 year-old male nursing home resident with severe sepsis possible healthcare associated pneumonia, acute kidney injury.  SIGNIFICANT EVENTS / STUDIES:  6/20>>> sent from nursing home to the emergency department to alter mental status, found to be hypotensive with systolic in the 70s emergency department, temperature 101.3. Chest x-ray consistent with right-sided pneumonia  LINES / TUBES: Right internal jugular CVL 6/20>> Foley catheter 6/20>> PEG tube >>> Colostomy bag >>  CULTURES: Blood cultures 6/20>>> Urine cultures 6/20>>negative Respiratory cultures 6/20>> MRSA screen 6/20 >> POSITIVE  ANTIBIOTICS: Vancomycin 6/1>>> Ertapenem 6/1>> Elita Quick 6/20>>  SUBJECTIVE:  Denies chest pain.  Breathing okay.    VITAL SIGNS: Temp:  [98.9 F (37.2 C)-100.2 F (37.9 C)] 99.5 F (37.5 C) (06/22 0805) Pulse Rate:  [82-96] 87 (06/22 1200) Resp:  [17-30] 28 (06/22 1000) BP: (109-173)/(56-83) 150/83 mmHg (06/22 1200) SpO2:  [91 %-100 %] 92 % (06/22 1200) HEMODYNAMICS: CVP:  [6 mmHg-7 mmHg] 6 mmHg INTAKE / OUTPUT: Intake/Output     06/21 0701 - 06/22 0700 06/22 0701 - 06/23 0700   I.V. (mL/kg) 3600 (38.3) 300 (3.2)   Other 1270 140   IV Piggyback 700    Total Intake(mL/kg) 5570 (59.3) 440 (4.7)   Urine (mL/kg/hr) 1915 (0.8) 500 (1)   Stool     Total Output 1915 500   Net +3655 -60          PHYSICAL EXAMINATION: General: No distress HEENT: No oral lesions  Lungs: Bilateral rhonchi more right than left. Adequate work of breathing.  Heart: HRRR S1 S2 Pulses are 2+ & equal.  Abdomen: Bowel sounds are positive, abdomen soft and non-tender without masses, PEG site without signs of infection, positive colostomy  bag. Extremities: No clubbing, cyanosis or edema. DP +1  Neuro: Alert and oriented X 3. 3/5 motor strength in bilateral upper and lower extremities (chronic).    LABS:  Recent Labs Lab 10/03/12 1720  10/03/12 2202 10/03/12 2230 10/04/12 0402 10/04/12 0420 10/04/12 1002 10/04/12 2100 10/05/12 0400  HGB 10.9*  --   --  10.1*  --  9.0*  --   --  8.0*  WBC 12.6*  --   --  14.5*  --  12.3*  --   --  8.6  PLT 208  --   --  190  --  150  --   --  139*  NA 135  --   --   --   --  139  --   --  139  K 4.2  --   --   --   --  4.0  --   --  3.5  CL 101  --   --   --   --  108  --   --  110  CO2 26  --   --   --   --  25  --   --  25  GLUCOSE 194*  --   --   --   --  112*  --   --  118*  BUN 28*  --   --   --   --  28*  --   --  22  CREATININE 1.43*  --   --  1.18  --  1.18  --   --  0.87  CALCIUM 9.5  --   --   --   --  8.4  --   --  8.5  MG  --   --   --   --   --  1.8  --   --   --   PHOS  --   --   --   --   --  2.3  --   --   --   AST 23  --   --   --   --   --   --   --   --   ALT 16  --   --   --   --   --   --   --   --   ALKPHOS 93  --   --   --   --   --   --   --   --   BILITOT 0.7  --   --   --   --   --   --   --   --   PROT 7.5  --   --   --   --   --   --   --   --   ALBUMIN 2.4*  --   --   --   --   --   --   --   --   LATICACIDVEN  --   < > 2.3*  --  1.7  --  0.9 1.0  --   PROCALCITON  --   --  44.79  --   --  48.50  --   --  27.48  < > = values in this interval not displayed.  Recent Labs Lab 10/04/12 1556 10/04/12 1954 10/05/12 0033 10/05/12 0419 10/05/12 0805  GLUCAP 137* 122* 114* 103* 118*    Imaging: Dg Chest Port 1 View  10/05/2012   *RADIOLOGY REPORT*  Clinical Data: Pneumonia and sepsis.  PORTABLE CHEST - 1 VIEW  Comparison: 10/03/2012  Findings: Patchy airspace disease of the right upper and perihilar lung again noted.  There does appear to be some subtle nodularity in the right lung as well, and continued follow-up is recommended as underlying  pulmonary nodules are not excluded.  There is additional atelectasis versus infiltrate at the left lung base.  No pulmonary edema or significant pleural fluid is identified.  The heart size and appearance of a pacemaker are stable.  IMPRESSION: Persistent patchy infiltrate of the right lung and increase in atelectasis/infiltrate at the left lung base.  There is suggestion of nodularity in the right lung and continued follow-up is recommended.   Original Report Authenticated By: Irish Lack, M.D.   Dg Chest Portable 1 View  10/03/2012   *RADIOLOGY REPORT*  Clinical Data: Central line placement.  Sepsis.  PORTABLE CHEST - 1 VIEW 6:43 p.m.  Comparison: 10/03/2012 at 5:28 p.m.  Findings: Right jugular vein catheter has been inserted and the tip is in the superior vena cava at the cavoatrial junction in good position.  No pneumothorax.  Faint infiltrate in the right upper lobe.  Lungs otherwise clear.  Heart size and vascularity are normal.  Dual lead pacer in place.  No acute osseous abnormality.  Fairly severe arthritis of the right shoulder joint.  IMPRESSION:  1.  Central line good position with no pneumothorax. 2.  Right upper lobe infiltrate.   Original Report Authenticated By: Francene Boyers, M.D.   Dg Chest Port 1 View  10/03/2012   *  RADIOLOGY REPORT*  Clinical Data: Sepsis and weakness  PORTABLE CHEST - 1 VIEW  Comparison: 09/13/2012  Findings: Right chest wall pacer device is noted with lead in the right atrial appendage and right ventricle.  Heart size is normal. Bilateral hazy lung opacities are identified and appear upper lobe predominant. No pleural effusion identified.  Postsurgical changes within the cervical spine noted compatible with anterior and posterior fusion.  IMPRESSION:  1.  Bilateral hazy lung opacities which may indicate multifocal pneumonia or edema.   Original Report Authenticated By: Signa Kell, M.D.     ASSESSMENT / PLAN:  PULMONARY A: Health care associated pneumonia,  possible aspiration P:   -Maintain the head of the bed elevated -continue Abx -Followup blood and respiratory cultures -Monitor respiratory status  CARDIOVASCULAR A: Severe sepsis in the setting of health care associated pneumonia and chronic osteomyelitis - Recent echo on June 4 revealed EF of 55-60% P:  -even fluid balance  RENAL A:  Acute kidney injury, likely prerenal P:   -IV hydration -Foley changed, monitor urine output and renal function -Avoid nephrotoxic medications and renal he does all meds  GASTROINTESTINAL A:  Status post PEG placement due to dysphagia P:   -tube feeds -GI prophylaxis  HEMATOLOGIC A:  Leukocytosis, normocytic anemia P:  -f/u CBC  INFECTIOUS A:  Severe sepsis likely 2 to healthcare associated pneumonia, sacral osteomyelitis >> PICC line remove, and foley changed in ED. P:   -Continue vancomycin, ertapenem as advised by infectious disease for Klebsiella osteomyelitis. Per infectious disease recommendation vancomycin and ertapenem are to be continued until July 12. -Continue Elita Quick to cover for pseudomonas.  -May need to re-consult Infectious disease -Followup cultures -wound care consult  ENDOCRINE A:  Hypothyroidism, diabetes P:   -Continue thyroid supplementation -Monitor serum glucose and use supplemental insulin to obtain glycemic control  NEUROLOGIC A:  Encephalopathy likely secondary to sepsis >> improved. P:   -We'll continue to monitor mentation  Transfer to floor 6/22.  Transfer to [facility] 6/23 and PCCM sign off.  Coralyn Helling, MD [facility] Pulmonary/Critical Care 10/05/2012, 12:14 PM Pager:  [phone] After 3pm call: [phone]

## 2012-10-06 LAB — BASIC METABOLIC PANEL
CO2: 27 mEq/L (ref 19–32)
Calcium: 9.1 mg/dL (ref 8.4–10.5)
Chloride: 106 mEq/L (ref 96–112)
Creatinine, Ser: 0.81 mg/dL (ref 0.50–1.35)
Glucose, Bld: 117 mg/dL — ABNORMAL HIGH (ref 70–99)

## 2012-10-06 LAB — GLUCOSE, CAPILLARY
Glucose-Capillary: 115 mg/dL — ABNORMAL HIGH (ref 70–99)
Glucose-Capillary: 120 mg/dL — ABNORMAL HIGH (ref 70–99)
Glucose-Capillary: 137 mg/dL — ABNORMAL HIGH (ref 70–99)
Glucose-Capillary: 137 mg/dL — ABNORMAL HIGH (ref 70–99)

## 2012-10-06 LAB — CBC
HCT: 28.1 % — ABNORMAL LOW (ref 39.0–52.0)
MCH: 25.1 pg — ABNORMAL LOW (ref 26.0–34.0)
MCV: 80.1 fL (ref 78.0–100.0)
RDW: 19 % — ABNORMAL HIGH (ref 11.5–15.5)
WBC: 8.7 10*3/uL (ref 4.0–10.5)

## 2012-10-06 MED ORDER — SODIUM CHLORIDE 0.9 % IV SOLN
1.0000 g | Freq: Three times a day (TID) | INTRAVENOUS | Status: DC
  Administered 2012-10-06 – 2012-10-07 (×2): 1 g via INTRAVENOUS
  Filled 2012-10-06 (×5): qty 1

## 2012-10-06 NOTE — Progress Notes (Signed)
UR COMPLETED  

## 2012-10-06 NOTE — Progress Notes (Signed)
[facility] PROGRESS NOTE  ASON HESLIN ZOX:[id number] DOB: 05-11-1935 DOA: 10/03/2012 PCP: Catheryn Bacon, MD  Assessment/Plan:  Severe Sepsis  AMS Resolved.  BP improved and normalizing  Fever curve trending down.  Right sided HCAP vs Aspiration PNA with PEG in place  On Invanz and Vanc prior to admission.  Elita Quick added to cover pseudomonas  Will discuss Antibiotic coverage with ID prior to D/C 6/24 (given recent bacteremia and endocarditis)  Bld cx with NGTD, Urine culture neg, Sputum culture pending.  Currently has right IJ for access.  Awaiting neg blood cx before PICC can be placed.  Continued raise HOB.  Check residuals.  Acute Kidney Injury  Felt to be pre-renal  Creatinine back to baseline (0.81)  Condom cath placed.  Avoid nephrotoxic medications.  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  Chronic Sacral Decub with colostomy in place  flap procedure for his chronic sacral decubitus one year ago that failed  Appreciate wound care consultation and recommendations.  DM  SSI- Moderate  CBGs controlled  Hypothyroid  Continue Synthroid  DVT Prophylaxis:  Heparin  Code Status: full code Family Communication: None bedside Disposition Plan: Inpatient   Consultants:  ID  Procedures:  Right IJ CVL placed 6/20   HPI/Subjective: No complaints.    Objective: Filed Vitals:   10/05/12 1400 10/05/12 1536 10/05/12 2211 10/06/12 0500  BP: 131/68 153/86 144/65 149/74  Pulse: 81  75 81  Temp:  99.4 F (37.4 C) 99.8 F (37.7 C) 99.8 F (37.7 C)  TempSrc:  Oral    Resp:   16 16  Height:       Weight:      SpO2: 95% 94% 96% 97%    Intake/Output Summary (Last 24 hours) at 10/06/12 1104 Last data filed at 10/06/12 0523  Gross per 24 hour  Intake   1480 ml  Output   2850 ml  Net  -1370 ml   Filed Weights   10/03/12 1945 10/04/12 0400  Weight: 101.5 kg (223 lb 12.3 oz) 94 kg (207 lb 3.7 oz)    Exam:   General:  A&O, NAD, Lying comfortably in bed  Cardiovascular: RRR, no murmurs, rubs or gallops, no lower extremity edema  Respiratory: bilateral rhonchi, no accessory muscle movement  Abdomen: Soft, non-tender, non-distended, + bowel sounds, no masses, peg and colostomy in place.    Musculoskeletal: no Clubbing, cyanosis, or edema.  In floating boots.  Data Reviewed: Basic Metabolic Panel:  Recent Labs Lab 10/03/12 1720 10/03/12 2230 10/04/12 0420 10/05/12 0400 10/06/12 0545  NA 135  --  139 139 139  K 4.2  --  4.0 3.5 3.6  CL 101  --  108 110 106  CO2 26  --  25 25 27   GLUCOSE 194*  --  112* 118* 117*  BUN 28*  --  28* 22 19  CREATININE 1.43* 1.18 1.18 0.87 0.81  CALCIUM 9.5  --  8.4 8.5 9.1  MG  --   --  1.8  --   --   PHOS  --   --  2.3  --   --    Liver Function Tests:  Recent Labs  Lab 10/03/12 1720  AST 23  ALT 16  ALKPHOS 93  BILITOT 0.7  PROT 7.5  ALBUMIN 2.4*   CBC:  Recent Labs Lab 10/03/12 1720 10/03/12 2230 10/04/12 0420 10/05/12 0400 10/06/12 0545  WBC 12.6* 14.5* 12.3* 8.6 8.7  NEUTROABS 10.1*  --   --   --   --   HGB 10.9* 10.1* 9.0* 8.0* 8.8*  HCT 33.5* 31.9* 28.7* 25.1* 28.1*  MCV 80.1 80.4 81.5 81.2 80.1  PLT 208 190 150 139* 170   BNP (last 3 results)  Recent Labs  09/13/12 1556  PROBNP 2027.0*   CBG:  Recent Labs Lab 10/05/12 1830 10/05/12 2006 10/06/12 0005 10/06/12 0428 10/06/12 0722  GLUCAP 107* 98 115* 120* 137*    Recent Results (from the past 240 hour(s))  CULTURE, BLOOD (ROUTINE X 2)     Status: None   Collection Time    10/03/12  6:45 PM      Result Value Range Status   Specimen  Description BLOOD CENTRAL LINE   Final   Special Requests BOTTLES DRAWN AEROBIC AND ANAEROBIC 5CC   Final   Culture  Setup Time 10/04/2012 03:45   Final   Culture     Final   Value:        BLOOD CULTURE RECEIVED NO GROWTH TO DATE CULTURE WILL BE HELD FOR 5 DAYS BEFORE ISSUING A FINAL NEGATIVE REPORT   Report Status PENDING   Incomplete  URINE CULTURE     Status: None   Collection Time    10/03/12  8:11 PM      Result Value Range Status   Specimen Description URINE, CATHETERIZED   Final   Special Requests NONE   Final   Culture  Setup Time 10/03/2012 20:45   Final   Colony Count NO GROWTH   Final   Culture NO GROWTH   Final   Report Status 10/04/2012 FINAL   Final  MRSA PCR SCREENING     Status: Abnormal   Collection Time    10/03/12  9:54 PM      Result Value Range Status   MRSA by PCR POSITIVE (*) NEGATIVE Final   Comment:            The GeneXpert MRSA Assay (FDA     approved for NASAL specimens     only), is one component of a     comprehensive MRSA colonization     surveillance program. It is not     intended to diagnose MRSA     infection nor to guide or     monitor treatment for     20     MRSA infections.     RESULT CALLED TO, READ BACK BY AND VERIFIED WITH:     TO C. PORTER (RN) BY L.LOMAX AT 2353 ON 6     2014     Studies: Dg Chest Port 1 View  10/05/2012   *RADIOLOGY REPORT*  Clinical Data: Pneumonia and sepsis.  PORTABLE CHEST - 1 VIEW  Comparison: 10/03/2012  Findings: Patchy airspace disease of the right upper and perihilar lung again noted.  There does appear to be some subtle nodularity in the right lung as well, and continued follow-up is recommended as underlying pulmonary nodules are not excluded.  There is additional atelectasis versus infiltrate at the left lung base.  No pulmonary edema or significant pleural fluid is identified.  The heart size and appearance of a pacemaker are stable.  IMPRESSION: Persistent  patchy infiltrate of the right lung and increase  in atelectasis/infiltrate at the left lung base.  There is suggestion of nodularity in the right lung and continued follow-up is recommended.   Original Report Authenticated By: Irish Lack, M.D.    Scheduled Meds: . antiseptic oral rinse  15 mL Mouth Rinse q12n4p  . cefTAZidime (FORTAZ)  IV  1 g Intravenous Q8H  . chlorhexidine  15 mL Mouth Rinse BID  . citalopram  10 mg Per Tube Daily  . ertapenem (INVANZ) IV  1 g Intravenous Q24H  . famotidine  20 mg Per Tube BID  . feeding supplement  30 mL Per Tube BID  . gabapentin  300 mg Per Tube QHS  . heparin  5,000 Units Subcutaneous Q8H  . insulin aspart  0-15 Units Subcutaneous Q4H  . levothyroxine  125 mcg Per Tube q morning - 10a  . liothyronine  25 mcg Per Tube q morning - 10a  . metoprolol tartrate  25 mg Per Tube BID  . scopolamine  1 patch Transdermal Q72H  . vancomycin  1,500 mg Intravenous Q24H   Continuous Infusions: . sodium chloride 50 mL/hr at 10/05/12 1221  . feeding supplement (GLUCERNA 1.2 CAL) 1,000 mL (10/05/12 1339)    Principal Problem:   Severe sepsis Active Problems:   Hypothyroidism   HCAP (healthcare-associated pneumonia)   Decubitus ulcer of sacral region, stage 3   Sacral osteomyelitis    Conley Canal  [facility] Pager [phone]. If 7PM-7AM, please contact night-coverage at [email or web address], password [facility] 10/06/2012, 11:04 AM  LOS: 3 days   Attending Patient seen and examined, agree with the above assessment and plan. Suspected Aspiration resulting in severe sepsis, doing well, likely back to SNF tomorrow. Place PICC line tomorrow.  S Zafira Munos

## 2012-10-06 NOTE — Progress Notes (Signed)
NUTRITION FOLLOW UP/CONSULT  DOCUMENTATION CODES  Per approved criteria   -Non-severe (moderate) malnutrition in the context of chronic illness    Intervention:   Continue Glucerna 1.2 formula at 70 ml/hr with Prostat liquid protein 30 ml twice daily to provide 2216 kcals, 131 gm protein, 1352 ml of free water. RD to follow for nutrition care plan.  Nutrition Dx:   Inadequate oral intake related to inability to eat as evidenced by NPO status.  Goal:   EN to meet >/= 90% of estimated nutrition needs.  Monitor:   EN regimen & tolerance, weight, labs, I/O's  Assessment:   Patient admitted from nursing home [facility]) to ED due to alterted mental status; found to be hypotensive with systolic in the 70's; temperature 101.3; chest x-ray consistent with right-sided pneumonia. Patient known to Nutritional Management from previous hospitalizations; PTA patient was on a Mechanical Soft diet ---> if consumed < 50% of meal, 250 ml bolus of Diabetisource formula was given; in addition patient received nocturnal continuous regimen of Diabetisource at 85 ml/hr x 8 hours (9pm-5am) which provides 816 calories, 41 gm protein, 556 ml free water.   RD consulted for "Stage IV Pressure Ulcer." Please see full assessment completed by RD on 6/21. Per chart review, pt's AMS has resolved, BP has improved, and most recent temp is 37.7.   Current TF regimen is: Glucerna 1.2 at 70 ml/hr with 30 ml Prostat via tube BID. This provides: 2216 kcals, 131 gm protein, 1352 ml of free water. RN confirms that pt is tolerating regimen well at this time.  Also receiving NS at 50 ml/hr - this provides an additional 1200 ml fluid daily.  Most recent blood sugars 117 - 162.  Patient meets criteria for moderate malnutrition in the context of chronic illness as evidenced by mild/moderate muscle wasting and subcutaneous fat loss in clavicles, temporal, and orbital region.   Height: Ht Readings from Last 1 Encounters:   10/03/12 6' 2.02" (1.88 m)    Weight Status:   Wt Readings from Last 1 Encounters:  10/04/12 207 lb 3.7 oz (94 kg)  No new weight available.  Re-estimated needs:  Kcal: 2100 - 2300 Protein: 130 - 140 g Fluid: per MD  Skin: stage IV sacral pressure ulcer - seen by [facility] RN 6/23  Diet Order:   NPO   Intake/Output Summary (Last 24 hours) at 10/06/12 1455 Last data filed at 10/06/12 1100  Gross per 24 hour  Intake    870 ml  Output   1650 ml  Net   -780 ml    Last BM: 350 ml via colostomy yesterday   Labs:   Recent Labs Lab 10/03/12 1720  10/04/12 0420 10/05/12 0400 10/06/12 0545  NA 135  --  139 139 139  K 4.2  --  4.0 3.5 3.6  CL 101  --  108 110 106  CO2 26  --  25 25 27   BUN 28*  --  28* 22 19  CREATININE 1.43*  < > 1.18 0.87 0.81  CALCIUM 9.5  --  8.4 8.5 9.1  MG  --   --  1.8  --   --   PHOS  --   --  2.3  --   --   GLUCOSE 194*  --  112* 118* 117*  < > = values in this interval not displayed.  CBG (last 3)   Recent Labs  10/06/12 0428 10/06/12 0722 10/06/12 1132  GLUCAP 120* 137* 162*  Scheduled Meds: . antiseptic oral rinse  15 mL Mouth Rinse q12n4p  . cefTAZidime (FORTAZ)  IV  1 g Intravenous Q8H  . chlorhexidine  15 mL Mouth Rinse BID  . citalopram  10 mg Per Tube Daily  . ertapenem (INVANZ) IV  1 g Intravenous Q24H  . famotidine  20 mg Per Tube BID  . feeding supplement  30 mL Per Tube BID  . gabapentin  300 mg Per Tube QHS  . heparin  5,000 Units Subcutaneous Q8H  . insulin aspart  0-15 Units Subcutaneous Q4H  . levothyroxine  125 mcg Per Tube q morning - 10a  . liothyronine  25 mcg Per Tube q morning - 10a  . metoprolol tartrate  25 mg Per Tube BID  . scopolamine  1 patch Transdermal Q72H  . vancomycin  1,500 mg Intravenous Q24H    Continuous Infusions: . sodium chloride 50 mL/hr at 10/05/12 1221  . feeding supplement (GLUCERNA 1.2 CAL) 1,000 mL (10/06/12 1245)    Jarold Motto MS, RD, LDN Pager: [phone] After-hours  pager: [phone]

## 2012-10-06 NOTE — Evaluation (Signed)
Physical Therapy Evaluation Patient Details Name: [name] MRN: [id number] DOB: [birth year] Today's Date: 10/06/2012 Time: 8657-8469 PT Time Calculation (min): 11 min  PT Assessment / Plan / Recommendation Clinical Impression  Pt is a 77 yo male admitted for AMS found to be septic. Pt resides in a SNF and remains appropriate to return to SNF. Pt poor historian but currently is dependent for all mobility and ADLs. acute PT to follow to progress OOB mobility.    PT Assessment  Patient needs continued PT services    Follow Up Recommendations  SNF;Supervision/Assistance - 24 hour    Does the patient have the potential to tolerate intense rehabilitation      Barriers to Discharge None      Equipment Recommendations  None recommended by PT    Recommendations for Other Services     Frequency Min 2X/week    Precautions / Restrictions Precautions Precautions: Fall Restrictions Weight Bearing Restrictions: No   Pertinent Vitals/Pain Pt with report of bilat LE pain below knees however once sitting for a few minutes pt reports pain to improve      Mobility  Bed Mobility Bed Mobility: Supine to Sit;Sit to Supine Supine to Sit: HOB elevated;1: +2 Total assist Supine to Sit: Patient Percentage: 10% Sit to Supine: 1: +2 Total assist;HOB flat Sit to Supine: Patient Percentage: 0% Details for Bed Mobility Assistance: pt with limited abiltiy to assist. Pt requires assist for trunk elevation and LE management both in/out of bed Transfers Transfers: Not assessed Ambulation/Gait Ambulation/Gait Assistance: Not tested (comment)    Exercises     PT Diagnosis:    PT Problem List: Decreased strength;Decreased range of motion;Decreased activity tolerance;Decreased balance;Decreased mobility PT Treatment Interventions: Functional mobility training;Therapeutic activities;Therapeutic exercise;Balance training;Neuromuscular re-education   PT Goals Acute Rehab PT Goals PT Goal  Formulation: With patient Time For Goal Achievement: 10/20/12 Potential to Achieve Goals: Fair Pt will go Supine/Side to Sit: with max assist;with rail PT Goal: Supine/Side to Sit - Progress: Goal set today Pt will Sit at Edge of Bed: with mod assist;1-2 min;with bilateral upper extremity support PT Goal: Sit at Edge Of Bed - Progress: Goal set today Pt will go Sit to Supine/Side: with mod assist;with HOB 0 degrees;with rail PT Goal: Sit to Supine/Side - Progress: Goal set today Pt will go Sit to Stand: with +2 total assist;with upper extremity assist PT Goal: Sit to Stand - Progress: Goal set today Pt will Transfer Bed to Chair/Chair to Bed: with +2 total assist PT Transfer Goal: Bed to Chair/Chair to Bed - Progress: Goal set today  Visit Information  Last PT Received On: 10/06/12 Assistance Needed: +2    Subjective Data  Subjective: Pt received supine in bed agreeable to PT s/p max encouragement.   Prior Functioning  Home Living Available Help at Discharge: Skilled Nursing Facility Type of Home: Skilled Nursing Facility Additional Comments: pt dependent for all mobility Prior Function Level of Independence: Needs assistance Communication Communication: No difficulties Dominant Hand: Right    Cognition  Cognition Arousal/Alertness: Awake/alert Behavior During Therapy: Flat affect Overall Cognitive Status: Difficult to assess Difficult to assess due to:  (when asked question pt reports "lets not talk to much.")    Extremity/Trunk Assessment Right Upper Extremity Assessment RUE ROM/Strength/Tone: Deficits RUE ROM/Strength/Tone Deficits: limited ROM, R UE in extensor tone pattern, limited flexion ROM Left Upper Extremity Assessment LUE ROM/Strength/Tone: Deficits LUE ROM/Strength/Tone Deficits: limited functional use, grossly 3-/5 Right Lower Extremity Assessment RLE ROM/Strength/Tone: Deficits RLE ROM/Strength/Tone Deficits:  pt with minimal initiation of LE mvmt when  asked Left Lower Extremity Assessment LLE ROM/Strength/Tone: Deficits LLE ROM/Strength/Tone Deficits: minimal voluntary efforts Trunk Assessment Trunk Assessment: Normal   Balance Balance Balance Assessed: Yes Static Sitting Balance Static Sitting - Balance Support: Bilateral upper extremity supported;Feet supported Static Sitting - Level of Assistance: 1: +1 Total assist Static Sitting - Comment/# of Minutes: pt unable to maintain upright position. pt with either posterior lean or anterior weight-shift, pt unable to find and maintain midline positioning. pt tolerated 3 min of sitting and repeated requested to return to supine in bed  End of Session PT - End of Session Activity Tolerance: Patient limited by pain;Patient limited by fatigue Patient left: in bed;with call bell/phone within reach Nurse Communication: Mobility status (condom cath came off)  GP     Novali Vollman, Becky Sax 10/06/2012, 3:52 PM  Lewis Shock, PT, DPT Pager #: [phone] Office #: [phone]

## 2012-10-06 NOTE — Clinical Social Work Psychosocial (Signed)
Clinical Social Work Department  BRIEF PSYCHOSOCIAL ASSESSMENT  Patient: KHAREE LESESNE  Account Number: [id number]  Admit date: 10/03/12 Clinical Social Worker Zoua Caporaso Riley Kill, MSW Date/Time:  Referred by: Physician Date Referred:  Referred for   SNF Placement   Other Referral:  Interview type: Patient  Other interview type: PSYCHOSOCIAL DATA  Living Status: With his spouse Admitted from facility: [facility] Level of care: SNF Primary support name: Ladonna Snide Primary support relationship to patient: Wife Degree of support available:  Strong and vested  CURRENT CONCERNS  Current Concerns   Post-Acute Placement   Other Concerns:  SOCIAL WORK ASSESSMENT / PLAN  CSW met with pt re: PT recommendation for SNF.   Pt lives with his spouse but was admitted from [facility]  CSW explained placement process and answered questions.   Pt reports he is agreeable to returning to [facility]   CSW completed FL2 and sent clinicals to [facility]     Assessment/plan status: Information/Referral to [facility]  Other assessment/ plan:  Information/referral to community resources:  SNF     PATIENT'S/FAMILY'S RESPONSE TO PLAN OF CARE:  Pt  reports he is agreeable to returning to [facility].  Pt verbalized understanding of placement process and appreciation for CSW assist.   Sabino Niemann, MSW [phone]

## 2012-10-06 NOTE — Consult Note (Signed)
WOC consult Note Reason for Consult: Pressure ulcer to sacrum.  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 Wound bed: 50% beefy red, 40% pink, 10% yellow Drainage (amount, consistency, odor): Large amount of yellowish drainage, no odor noted.  Periwound: Red, macerated due to moisture, patient incontinent. Dressing procedure/placement/frequency:  Aquacel to open area to absorb drainage and for antimicrobial effects, covered with foam dressing to further absorb moisture and drainage and protect area.  Will order air mattress to assist with pressure redistribution and dietetican consult to optimize protein intake.  Prevalon boots in place bilaterally to decrease pressure and sheer to heels. Patient has colostomy to LLQ.  Patient states bag was changed yesterday (6/22).  Good seal around bag, skin intact, stoma pink and moist above skin level.  Supplies in room for bedside nurses. Norva Karvonen RN, MSN Student Cammie Mcgee MSN, RN, CWOCN, [place], CNS [phone]

## 2012-10-07 ENCOUNTER — Inpatient Hospital Stay (HOSPITAL_COMMUNITY): Payer: Medicare Other

## 2012-10-07 DIAGNOSIS — A419 Sepsis, unspecified organism: Secondary | ICD-10-CM

## 2012-10-07 LAB — GLUCOSE, CAPILLARY
Glucose-Capillary: 123 mg/dL — ABNORMAL HIGH (ref 70–99)
Glucose-Capillary: 157 mg/dL — ABNORMAL HIGH (ref 70–99)

## 2012-10-07 LAB — CBC
MCH: 25.7 pg — ABNORMAL LOW (ref 26.0–34.0)
MCHC: 32.6 g/dL (ref 30.0–36.0)
MCV: 78.9 fL (ref 78.0–100.0)
Platelets: 168 10*3/uL (ref 150–400)
RBC: 3.46 MIL/uL — ABNORMAL LOW (ref 4.22–5.81)

## 2012-10-07 MED ORDER — INSULIN ASPART 100 UNIT/ML ~~LOC~~ SOLN: 0.0000 [IU] | Freq: Three times a day (TID) | SUBCUTANEOUS | Status: DC

## 2012-10-07 MED ORDER — BIOTENE DRY MOUTH MT LIQD: 15.0000 mL | Freq: Two times a day (BID) | OROMUCOSAL | Status: AC

## 2012-10-07 MED ORDER — METOPROLOL TARTRATE 25 MG/10 ML ORAL SUSPENSION: 25.0000 mg | Freq: Two times a day (BID) | ORAL | Status: AC

## 2012-10-07 MED ORDER — VANCOMYCIN HCL 10 G IV SOLR: 1500.0000 mg | Freq: Two times a day (BID) | INTRAVENOUS | Status: AC

## 2012-10-07 MED ORDER — HEPARIN SOD (PORK) LOCK FLUSH 100 UNIT/ML IV SOLN
250.0000 [IU] | INTRAVENOUS | Status: AC | PRN
  Administered 2012-10-07: 500 [IU]

## 2012-10-07 MED ORDER — SODIUM CHLORIDE 0.9 % IV SOLN: 1.0000 g | Freq: Three times a day (TID) | INTRAVENOUS | Status: AC

## 2012-10-07 MED ORDER — VANCOMYCIN HCL 10 G IV SOLR
1500.0000 mg | Freq: Two times a day (BID) | INTRAVENOUS | Status: DC
  Filled 2012-10-07 (×2): qty 1500

## 2012-10-07 NOTE — Procedures (Signed)
Procedure:  Tunneled central line placement 6 Fr DL Power Line placed via left IJ vein.  Tip in SVC.  OK to use.

## 2012-10-07 NOTE — Discharge Summary (Signed)
Seen and examined, agree with above. Ok to discharge to SNF today.

## 2012-10-07 NOTE — Progress Notes (Signed)
Pt discharged to [facility]. Report called to RN at [facility]. Pts Midline Double Lumen PICC line was hep locked prior to discharge and Right Subclavain CVC Triple Lumen was discontinued by IV team prior to leaving. Pt left unit in a stable condition via EMS.

## 2012-10-07 NOTE — Discharge Summary (Addendum)
Physician Discharge Summary  PEARSON PICOU ZOX:[id number] DOB: 1935-07-23 DOA: 10/03/2012  PCP: Catheryn Bacon, MD  Admit date: 10/03/2012 Discharge date: 10/07/2012  Time spent: 60  minutes  Recommendations for Outpatient Follow-up:  Patient with PICC line.  To be on Meropenem and Vancomycin thru 10/25/2012.  Follow up with Infectious Disease in 1 week.  He will need weekly BMET and Vancomycin Levels starting on 6/28 and continuing thru 7/12.  Patient has frequent thickened secretions.  He needs oral care tid and oral suctioning multiple times a day.    Elevate head of bed greater than 40 degrees at all times.  Patient needs on-going wound care for sacral decubitus with osteomyelitis.  Consider repeat swallow evaluation while at the SNF if oral intake is desired by patient and family  Discharge Diagnoses:  Principal Problem:   Severe sepsis Active Problems:   Hypothyroidism   HCAP (healthcare-associated pneumonia)   Decubitus ulcer of sacral region, stage 3   Sacral osteomyelitis   Discharge Condition: stable, needs on-going wound care and antibiotics.  High risk for aspiration.  Diet recommendation: PEG feedings.  Check residuals if bolus feeding.  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  He has chronic sacral wounds with osteo.  He has a colostomy and foley catheter to prevent worsening of the wounds.  He chronically aspirates and has a PEG in place.  He was admitted on 6/20 by pulmonary critical care with severe sepsis and altered mental status from HCAP/Aspiration pneumonia.     Hospital Course:  Severe Sepsis  AMS Resolved.  BP has normalized again.  Fever curve trending down, but he is still having low grade fevers.  Right sided HCAP  vs Aspiration PNA with PEG in place  On Invanz and Vanc prior to admission. Fortaz added to cover pseudomonas. During this admission Elita Quick and Pincus Sanes were discontinued in favor of Meropenem and Vancomycin was continued. Infectious Disease (Dr. Drue Second) supported antibiotic changes via multiple telephone conversations. Bld cx with NGTD, Urine culture neg, Sputum culture shows moderate gram + cocci in pairs (should be covered by vanc) A right IJ was initially placed for access in ICU, but when the blood culture from 6/20 showed NGTD - the IJ was discontinued and a new PICC was placed on 6/24 just prior to discharge.   Continued raise HOB. Check residuals.  Acute Kidney Injury  Felt to be pre-renal  Creatinine back to baseline (0.81)  Foley replaced on 6/23 as patient could not tolerate condom cath.  Altered Mental Status   Secondary to toxic encephalopathy   resolved, and seems to be back to his usual baseline   Recent Klebsiella Bacteremia / Endocarditis with pace maker in place  6 weeks of IV antibiotics (most recently meropenem and vancomycin)  to be completed 7/12  Chronic Osteomyelitis   6 weeks of IV antibiotics (most recently meropenem and vancomycin)  to be completed 7/12 Appreciate wound care consultation and recommendations.  Chronic Sacral Decub with colostomy in place  flap procedure for his chronic sacral decubitus one year ago that failed  Appreciate wound care consultation and recommendations.  DM  SSI- Moderate  CBGs controlled  Hypothyroid  Continue Synthroid  Procedures:  Right IJ, PICC placement.  Consultations:  Admitted by PCCM  Infectious Disease  Wound Care  Discharge Exam: Filed Vitals:   10/06/12 1430 10/06/12 2102 10/07/12 0200 10/07/12 0449  BP: 140/69 140/78  169/83  Pulse: 76 78  78  Temp: 98.1 F (36.7 C) 99.6 F (37.6 C) 100 F (37.8 C) 100.6 F (38.1 C)  TempSrc:  Axillary  Axillary  Resp: 18 18  18   Height:      Weight:       SpO2: 95% 97%  94%   General: A&O, NAD, Lying comfortably in bed, quiet.  Thick white secretions in his mouth. Cardiovascular: RRR, + Systolic murmur, no rubs or gallops, no lower extremity edema  Respiratory: decreased inspiration, improved breath sounds, no accessory muscle movement  Abdomen: Soft, non-tender, non-distended, + bowel sounds, no masses, peg and colostomy in place.  Musculoskeletal: no Clubbing, cyanosis, or edema. In floating boots    Discharge Instructions      Discharge Orders   Future Appointments Provider Department Dept Phone   10/28/2012 10:00 AM Cliffton Asters, MD [facility] [phone]   Future Orders Complete By Expires     Diet - low sodium heart healthy  As directed     Discharge instructions  As directed     Comments:      Patient needs bmet and vancomycin levels weekly starting on 6/28.    Increase activity slowly  As directed         Medication List    STOP taking these medications       metoprolol tartrate 25 MG tablet  Commonly known as:  LOPRESSOR     sodium chloride 0.9 % SOLN 50 mL with ertapenem 1 G SOLR 1 g     vancomycin 1 GM/200ML Soln  Commonly known as:  VANCOCIN      TAKE these medications       acetaminophen 160 MG/5ML liquid  Commonly known as:  TYLENOL  Give 640 mg by tube every 4 (four) hours as needed for fever.     antiseptic oral rinse Liqd  15 mLs by Mouth Rinse route 2 times daily at 12 noon and 4 pm.     citalopram 10 MG tablet  Commonly known as:  CELEXA  10 mg by PEG Tube route every morning.     feeding supplement Liqd  Place 30 mLs into feeding tube 3 (three) times daily with meals.     ferrous sulfate 300 (60 FE) MG/5ML syrup  Give 300 mg by tube 2 (two) times daily.     gabapentin 300 MG capsule  Commonly known as:  NEURONTIN  Give 300 mg by tube at bedtime.     glycopyrrolate 1 MG tablet  Commonly known as:  ROBINUL  1 mg by PEG Tube route 3 (three) times  daily.     insulin aspart 100 UNIT/ML injection  Commonly known as:  novoLOG  Inject 0-15 Units into the skin 3 (three) times daily before meals. And at bedtime.    CBG 70 - 120: 0 units  CBG 121 - 150: 2 units  CBG 151 - 200: 3 units  CBG 201 - 250: 5 units  CBG 251 - 300: 8 units  CBG 301 - 350: 11 units  CBG 351 - 400: 15 units     insulin glargine 100 UNIT/ML injection  Commonly known as:  LANTUS  Inject 20 Units into the skin at bedtime.     levothyroxine 125 MCG tablet  Commonly known as:  SYNTHROID, LEVOTHROID  Give 125 mcg by tube every morning.     liothyronine 25 MCG tablet  Commonly known as:  CYTOMEL  25 mcg by PEG Tube route every morning.     metoprolol tartrate 25 mg/10 mL Susp  Commonly known as:  LOPRESSOR  Place 10 mLs (25 mg total) into feeding tube 2 (two) times daily.     multivitamins ther. w/minerals Tabs  1 tablet by PEG Tube route every morning.     potassium chloride 20 MEQ/15ML (10%) solution  Give 20 mEq by tube every morning.     scopolamine 1.5 MG  Commonly known as:  TRANSDERM-SCOP  Place 1 patch onto the skin every 3 (three) days.     sodium chloride 0.9 % SOLN 100 mL with meropenem 1 G SOLR 1 g  Inject 1 g into the vein every 8 (eight) hours.     sodium chloride 0.9 % SOLN 500 mL with vancomycin 10 G SOLR 1,500 mg  Inject 1,500 mg into the vein every 12 (twelve) hours.     thiamine 100 MG tablet  Commonly known as:  VITAMIN B-1  100 mg by PEG Tube route every morning.     traMADol 50 MG tablet  Commonly known as:  ULTRAM  50 mg by PEG Tube route every 6 (six) hours as needed for pain.     traZODone 50 MG tablet  Commonly known as:  DESYREL  Give 25 mg by tube at bedtime. For insomnia     vitamin C 500 MG tablet  Commonly known as:  ASCORBIC ACID  Give 500 mg by tube 2 (two) times daily.       No Known Allergies    The results of significant diagnostics from this hospitalization (including imaging, microbiology,  ancillary and laboratory) are listed below for reference.    Significant Diagnostic Studies: Ct Abdomen Pelvis W Contrast   10/03/2012   *RADIOLOGY REPORT*  Clinical Data: Status post placement of left upper extremity midline catheter to the level of the axillary vein for IV vancomycin administration.  The catheter could not be advanced centrally due to underlying venous stenosis.  There is inability to aspirate blood from the catheter currently.  IR FLOURO RM 0-60 MIN  Fluoro time:  3 seconds.  Comparison: Imaging during midline catheter placement on 09/22/2012.  Findings: Fluoroscopy was performed showing stable positioning of the midline catheter at the level of the rib margin on the left within the axillary vein.  Aspiration did not yield blood.  The catheter flushed easily.  The catheter was retracted by a few centimeters and additional manipulation did not yield blood with aspiration.  During manipulation, the patient experience significant nausea and vomiting and had to be suctioned.  He could not tolerate any further manipulation or replacement of the catheter.  This catheter is clearly intravascular and can be used for vancomycin administration.  IMPRESSION: Left arm midline catheter tip is within the axillary vein.  This catheter flushed easily.  With retraction, there was persistent inability to aspirate blood, likely reflecting fibrin sheath at the tip of the catheter.  Manipulation of the catheter had to be abruptly discontinued due to development of nausea with active vomiting requiring suctioning of the oropharynx.  The catheter can continue to be used for IV administration currently.   Original Report Authenticated By: Irish Lack, M.D.    Dg Chest Port 1 View  10/05/2012   *RADIOLOGY REPORT*  Clinical Data: Pneumonia  and sepsis.  PORTABLE CHEST - 1 VIEW  Comparison: 10/03/2012  Findings: Patchy airspace disease of the right upper and perihilar lung again noted.  There does appear to be  some subtle nodularity in the right lung as well, and continued follow-up is recommended as underlying pulmonary nodules are not excluded.  There is additional atelectasis versus infiltrate at the left lung base.  No pulmonary edema or significant pleural fluid is identified.  The heart size and appearance of a pacemaker are stable.  IMPRESSION: Persistent patchy infiltrate of the right lung and increase in atelectasis/infiltrate at the left lung base.  There is suggestion of nodularity in the right lung and continued follow-up is recommended.   Original Report Authenticated By: Irish Lack, M.D.   Dg Chest Portable 1 View  10/03/2012   *RADIOLOGY REPORT*  Clinical Data: Central line placement.  Sepsis.  PORTABLE CHEST - 1 VIEW 6:43 p.m.  Comparison: 10/03/2012 at 5:28 p.m.  Findings: Right jugular vein catheter has been inserted and the tip is in the superior vena cava at the cavoatrial junction in good position.  No pneumothorax.  Faint infiltrate in the right upper lobe.  Lungs otherwise clear.  Heart size and vascularity are normal.  Dual lead pacer in place.  No acute osseous abnormality.  Fairly severe arthritis of the right shoulder joint.  IMPRESSION:  1.  Central line good position with no pneumothorax. 2.  Right upper lobe infiltrate.   Original Report Authenticated By: Francene Boyers, M.D.   Dg Chest Port 1 View  10/03/2012   *RADIOLOGY REPORT*  Clinical Data: Sepsis and weakness  PORTABLE CHEST - 1 VIEW  Comparison: 09/13/2012  Findings: Right chest wall pacer device is noted with lead in the right atrial appendage and right ventricle.  Heart size is normal. Bilateral hazy lung opacities are identified and appear upper lobe predominant. No pleural effusion identified.  Postsurgical changes within the cervical spine noted compatible with anterior and posterior fusion.  IMPRESSION:  1.  Bilateral hazy lung opacities which may indicate multifocal pneumonia or edema.   Original Report Authenticated  By: Signa Kell, M.D.        Microbiology: Recent Results (from the past 240 hour(s))  CULTURE, BLOOD (ROUTINE X 2)     Status: None   Collection Time    10/03/12  6:45 PM      Result Value Range Status   Specimen Description BLOOD CENTRAL LINE   Final   Special Requests BOTTLES DRAWN AEROBIC AND ANAEROBIC 5CC   Final   Culture  Setup Time 10/04/2012 03:45   Final   Culture     Final   Value:        BLOOD CULTURE RECEIVED NO GROWTH TO DATE CULTURE WILL BE HELD FOR 5 DAYS BEFORE ISSUING A FINAL NEGATIVE REPORT   Report Status PENDING   Incomplete  URINE CULTURE     Status: None   Collection Time    10/03/12  8:11 PM      Result Value Range Status   Specimen Description URINE, CATHETERIZED   Final   Special Requests NONE   Final   Culture  Setup Time 10/03/2012 20:45   Final   Colony Count NO GROWTH   Final   Culture NO GROWTH   Final   Report Status 10/04/2012 FINAL   Final  MRSA PCR SCREENING     Status: Abnormal   Collection Time    10/03/12  9:54 PM  Result Value Range Status   MRSA by PCR POSITIVE (*) NEGATIVE Final   Comment:            The GeneXpert MRSA Assay (FDA     approved for NASAL specimens     only), is one component of a     comprehensive MRSA colonization     surveillance program. It is not     intended to diagnose MRSA     infection nor to guide or     monitor treatment for     20     MRSA infections.     RESULT CALLED TO, READ BACK BY AND VERIFIED WITH:     TO C. PORTER (RN) BY L.LOMAX AT 2353 ON 6     2014  CULTURE, RESPIRATORY (NON-EXPECTORATED)     Status: None   Collection Time    10/06/12 12:11 AM      Result Value Range Status   Specimen Description TRACHEAL ASPIRATE   Final   Special Requests NONE   Final   Gram Stain     Final   Value: MODERATE WBC PRESENT, PREDOMINANTLY PMN     MODERATE SQUAMOUS EPITHELIAL CELLS PRESENT     MODERATE GRAM POSITIVE COCCI IN PAIRS   Culture Culture reincubated for better growth   Final   Report  Status PENDING   Incomplete     Labs: Basic Metabolic Panel:  Recent Labs Lab 10/03/12 1720 10/03/12 2230 10/04/12 0420 10/05/12 0400 10/06/12 0545  NA 135  --  139 139 139  K 4.2  --  4.0 3.5 3.6  CL 101  --  108 110 106  CO2 26  --  25 25 27   GLUCOSE 194*  --  112* 118* 117*  BUN 28*  --  28* 22 19  CREATININE 1.43* 1.18 1.18 0.87 0.81  CALCIUM 9.5  --  8.4 8.5 9.1  MG  --   --  1.8  --   --   PHOS  --   --  2.3  --   --    Liver Function Tests:  Recent Labs Lab 10/03/12 1720  AST 23  ALT 16  ALKPHOS 93  BILITOT 0.7  PROT 7.5  ALBUMIN 2.4*   CBC:  Recent Labs Lab 10/03/12 1720 10/03/12 2230 10/04/12 0420 10/05/12 0400 10/06/12 0545 10/07/12 0558  WBC 12.6* 14.5* 12.3* 8.6 8.7 6.8  NEUTROABS 10.1*  --   --   --   --   --   HGB 10.9* 10.1* 9.0* 8.0* 8.8* 8.9*  HCT 33.5* 31.9* 28.7* 25.1* 28.1* 27.3*  MCV 80.1 80.4 81.5 81.2 80.1 78.9  PLT 208 190 150 139* 170 168    BNP (last 3 results)  Recent Labs  09/13/12 1556  PROBNP 2027.0*   CBG:  Recent Labs Lab 10/06/12 1613 10/06/12 2003 10/06/12 2342 10/07/12 0429 10/07/12 0848  GLUCAP 137* 144* 147* 136* 123*    Signed:  Stephani Police, PA-C  [facility] 10/07/2012, 12:33 PM   Attending Patient seen and examined, agree with the above assessment and plan, much better, blood cultures on 6/20-negative. Place PICC line today, spoke with Dr Annalee Genta agreed with changing Ertapenem to Meropenem, will discharge to SNF today. Rest as above Long d/w spouse at bedside this am as well.  S Ghimire

## 2012-10-07 NOTE — Progress Notes (Signed)
ANTIBIOTIC CONSULT NOTE  Pharmacy Consult for vancomycin, ertapenem, fortaz Indication: Klebsiella bacteremia, chronic pelvic osteomyelitis, severe sepsis, possible HCAP  No Known Allergies  Labs:  Recent Labs  10/05/12 0400 10/06/12 0545 10/07/12 0558  WBC 8.6 8.7 6.8  HGB 8.0* 8.8* 8.9*  PLT 139* 170 168  CREATININE 0.87 0.81  --    Estimated Creatinine Clearance: 91.6 ml/min (by C-G formula based on Cr of 0.81). No results found for this basename: VANCOTROUGH, VANCOPEAK, VANCORANDOM, GENTTROUGH, GENTPEAK, GENTRANDOM, TOBRATROUGH, TOBRAPEAK, TOBRARND, AMIKACINPEAK, AMIKACINTROU, AMIKACIN,  in the last 72 hours    Assessment: 77 yo M admitted from NH s/p recent admit to [facility] for Klebsiella bacteremia (R-zosyn) likely 2/2 chronic pelvic osteomyelitis.  Has been receiving vancomycin since 5/31 and ertapenem since 6/4, as per ID rec's.  Also noted to have received 1500 mg IV vancomycin in ED 6/20 at 1936.  Vancomycin random level this am reported as 24.2 mcg/mL at  0420.    Per nursing home and previous hospital records, vanc dosing as enumerated below:  [facility] 5/31: Vanc 1g IV x1; SCr 1.7 6/1 - 6/2: Vanc 1250 q24h, SCr 1.44 6/3-6/8: Vanc d/c'd 6/9 - 6/10: Vanc 1g IV q8h, SCr 0.7  [facility] 6/10-6/14: discharged on 1g IV q8h to [facility]  6/14: Vanc level = 46 mcg/mL at 0713, SCr 1.05 6/16 @ 1400 Vanc level=17.89, SCr 0.9 6/16-6/17: Vanc changed to 1500 mg IV q24h, given at midnight, with orders ton continue until 10/28/12 6/18-6/19: doses NOT charted  [facility] 6/20: Vanc IV 1500 mg x 1 in ED at 1936   Renal function has now improved  Goal of Therapy:  Vancomycin trough level 15-20 mcg/ml  Plan:  1) Increase Vancomycin to 1500 mg iv Q 12 hours 2) Continue Meropenem 1 Gram iv Q 8 hours (Fortaz and Invaz now dced) 3) Cancel trough for tonight -- recheck later in week.  Thank you. Okey Regal, PharmD [phone] 10/07/2012 9:37 AM

## 2012-10-07 NOTE — Progress Notes (Signed)
Pt came in with foley cath per wife, has had x 2 yrs b/c of wanting to keep wound on sacrum dry, f/c was removed this admit on 6/20, spouse and pt wanted f/c reinserted.  F/C was reinserted, can we have MD order for this admission? Thanks.

## 2012-10-08 ENCOUNTER — Encounter: Payer: Self-pay | Admitting: Nurse Practitioner

## 2012-10-08 LAB — CULTURE, RESPIRATORY W GRAM STAIN

## 2012-10-08 NOTE — Care Management Note (Signed)
    Page 1 of 1   10/08/2012     7:48:38 AM   CARE MANAGEMENT NOTE 10/08/2012  [name], [name]   Account Number:  [id number]  Date Initiated:  10/07/2012  Documentation initiated by:  [facility]  Subjective/Objective Assessment:   admitted with severe sepsis     Action/Plan:   PT-recommended SNF   Anticipated DC Date:  10/07/2012   Anticipated DC Plan:  SKILLED NURSING FACILITY  In-house referral  Clinical Social Worker      DC Planning Services  CM consult      Choice offered to / List presented to:             Status of service:  Completed, signed off Medicare Important Message given?   (If response is "NO", the following Medicare IM given date fields will be blank) Date Medicare IM given:   Date Additional Medicare IM given:    Discharge Disposition:  SKILLED NURSING FACILITY  Per UR Regulation:  Reviewed for med. necessity/level of care/duration of stay  If discussed at Long Length of Stay Meetings, dates discussed:    Comments:

## 2012-10-10 LAB — CULTURE, BLOOD (ROUTINE X 2): Culture: NO GROWTH

## 2012-10-11 ENCOUNTER — Other Ambulatory Visit: Payer: Self-pay

## 2012-10-11 LAB — VANCOMYCIN, TROUGH: Vancomycin, Trough: 37 ug/mL (ref 10–20)

## 2012-10-13 ENCOUNTER — Non-Acute Institutional Stay (SKILLED_NURSING_FACILITY): Payer: Medicare Other | Admitting: Internal Medicine

## 2012-10-13 DIAGNOSIS — M869 Osteomyelitis, unspecified: Secondary | ICD-10-CM

## 2012-10-13 DIAGNOSIS — M4628 Osteomyelitis of vertebra, sacral and sacrococcygeal region: Secondary | ICD-10-CM

## 2012-10-13 DIAGNOSIS — I10 Essential (primary) hypertension: Secondary | ICD-10-CM

## 2012-10-13 DIAGNOSIS — R7881 Bacteremia: Secondary | ICD-10-CM

## 2012-10-13 DIAGNOSIS — R1310 Dysphagia, unspecified: Secondary | ICD-10-CM

## 2012-10-13 DIAGNOSIS — L89109 Pressure ulcer of unspecified part of back, unspecified stage: Secondary | ICD-10-CM

## 2012-10-13 DIAGNOSIS — L8993 Pressure ulcer of unspecified site, stage 3: Secondary | ICD-10-CM

## 2012-10-13 DIAGNOSIS — I798 Other disorders of arteries, arterioles and capillaries in diseases classified elsewhere: Secondary | ICD-10-CM

## 2012-10-13 DIAGNOSIS — E1351 Other specified diabetes mellitus with diabetic peripheral angiopathy without gangrene: Secondary | ICD-10-CM

## 2012-10-13 DIAGNOSIS — L89153 Pressure ulcer of sacral region, stage 3: Secondary | ICD-10-CM

## 2012-10-13 NOTE — Progress Notes (Signed)
Patient ID: [name], male   DOB: 12-20-1935, 77 y.o.   MRN: [id number]    PCP: Catheryn Bacon, MD  Code Status: full code   No Known Allergies  Chief Complaint: re-admit post hospital admission   HPI:   77 y/o male patient is a long term resident of the facility and was admitted to hospital on 10/03/12 with confusion and was diagnosed to have sepsis in setting of HCAP and aspiration pneumonitis. He has history of CVA with residual right sided hemiparesis, mitral valve endocarditis and dysphagia among other problems and is under total care. He has a large coccygeal decubitus ulcer with osteomyelitis, PEG tube and foley in place. He was on invanz and vancomycin prior to admission. This coverage was broadened further by adding fortaz. ID was consulted and later he was put on meropenem and vancomycin. His PICC line was removed and a new line was placed. His mental status improved. He was discharged back to the facility with total 6 weeks of iv antibiotics which is to be completed on 10/25/12 He was seen in his room today. He is at his baseline. He denies any complaint today. He is in bed the entire day. He is tolerating tube feed well.   Review of Systems  Constitutional: Negative for fever, chills and diaphoresis.  HENT: Negative for congestion.   Eyes: Negative for blurred vision.  Respiratory: Positive for sputum production. Negative for shortness of breath.   Cardiovascular: Negative for chest pain and palpitations.  Gastrointestinal: Negative for heartburn, nausea, vomiting and abdominal pain.        Has colostomy bag  Genitourinary:        Has chronic indwelling foley catheter  Skin: Negative for rash.  Neurological: Positive for weakness. Negative for dizziness and headaches.     Past Medical History  Diagnosis Date  . Stroke     Hx of with right hemiparesis  . Hypertension   . Endocarditis, mitral valve, syphilitic   . Hypothyroidism   . Anorexia   . Dysphagia    . Reflux   . Bacteremia   . Clostridium difficile colitis   . CVA (cerebral vascular accident)   . Pressure ulcer   . Diabetes mellitus, type 2   . Anemia   . Stroke    Past Surgical History  Procedure Laterality Date  . Colostomy    . Peg placement    . Hernia repair     Social History:   reports that he has never smoked. He does not have any smokeless tobacco history on file. He reports that he does not drink alcohol or use illicit drugs.  Family History  Problem Relation Age of Onset  . Cancer Mother   . Other Father     old age after a fall  . Alzheimer's disease Sister     Medications: Patient's Medications  New Prescriptions   No medications on file  Previous Medications   ACETAMINOPHEN (TYLENOL) 160 MG/5ML LIQUID    Give 640 mg by tube every 4 (four) hours as needed for fever.    ANTISEPTIC ORAL RINSE (BIOTENE) LIQD    15 mLs by Mouth Rinse route 2 times daily at 12 noon and 4 pm.   CITALOPRAM (CELEXA) 10 MG TABLET    10 mg by PEG Tube route every morning.    FEEDING SUPPLEMENT (PRO-STAT SUGAR FREE 64) LIQD    Place 30 mLs into feeding tube 3 (three) times daily with meals.   FERROUS  SULFATE 300 (60 FE) MG/5ML SYRUP    Give 300 mg by tube 2 (two) times daily.     GABAPENTIN (NEURONTIN) 300 MG CAPSULE    Give 300 mg by tube at bedtime.    GLYCOPYRROLATE (ROBINUL) 1 MG TABLET    1 mg by PEG Tube route 3 (three) times daily.   INSULIN ASPART (NOVOLOG) 100 UNIT/ML INJECTION    Inject 0-15 Units into the skin 3 (three) times daily before meals. And at bedtime.   CBG 70 - 120: 0 units CBG 121 - 150: 2 units CBG 151 - 200: 3 units CBG 201 - 250: 5 units CBG 251 - 300: 8 units CBG 301 - 350: 11 units CBG 351 - 400: 15 units   INSULIN GLARGINE (LANTUS) 100 UNIT/ML INJECTION    Inject 20 Units into the skin at bedtime.   LEVOTHYROXINE (SYNTHROID, LEVOTHROID) 125 MCG TABLET    Give 125 mcg by tube every morning.    LIOTHYRONINE (CYTOMEL) 25 MCG TABLET    25 mcg by PEG  Tube route every morning.    METOPROLOL TARTRATE (LOPRESSOR) 25 MG/10 ML SUSP    Place 10 mLs (25 mg total) into feeding tube 2 (two) times daily.   MULTIPLE VITAMINS-MINERALS (MULTIVITAMINS THER. W/MINERALS) TABS    1 tablet by PEG Tube route every morning.    POTASSIUM CHLORIDE 20 MEQ/15ML (10%) SOLUTION    Give 20 mEq by tube every morning.    SCOPOLAMINE (TRANSDERM-SCOP) 1.5 MG    Place 1 patch onto the skin every 3 (three) days.   SODIUM CHLORIDE 0.9 % SOLN 100 ML WITH MEROPENEM 1 G SOLR 1 G    Inject 1 g into the vein every 8 (eight) hours.   SODIUM CHLORIDE 0.9 % SOLN 500 ML WITH VANCOMYCIN 10 G SOLR 1,500 MG    Inject 1,500 mg into the vein every 12 (twelve) hours.   THIAMINE HCL (VITAMIN B-1) 100 MG TABLET    100 mg by PEG Tube route every morning.    TRAMADOL (ULTRAM) 50 MG TABLET    50 mg by PEG Tube route every 6 (six) hours as needed for pain.   TRAZODONE (DESYREL) 50 MG TABLET    Give 25 mg by tube at bedtime. For insomnia   VITAMIN C (ASCORBIC ACID) 500 MG TABLET    Give 500 mg by tube 2 (two) times daily.   Modified Medications   No medications on file  Discontinued Medications   No medications on file     Physical Exam: Filed Vitals:   10/13/12 1827  BP: 111/74  Pulse: 68  Temp: 97.1 F (36.2 C)  Resp: 19  SpO2: 93%   gen- frail, elderly male in NAD HEENT- no pallor, secretions in his mouth, no palpable lymph nodes cvs- n s1, s2, rrr respi- b/l decreased air entry abdo- bs+, soft, colostomy site clean, indwelling foley draining urine, peg tube site clean Ext- right sided hemiparesis, able to move left side, has floaters in both heels, picc line in place and site clean Psych- more cheerful that previous visit, one word answers Skin- stage 3 pressure ulcer in the coccyx  Labs reviewed: Basic Metabolic Panel:  Recent Labs  16/10/96 0801  10/03/12 1720  10/04/12 0420 10/05/12 0400 10/06/12 0545  NA 140  < > 135  --  139 139 139  K 4.1  < > 4.2  --  4.0 3.5  3.6  CL 109  < > 101  --  108 110 106  CO2 22  < > 26  --  25 25 27   GLUCOSE 105*  < > 194*  --  112* 118* 117*  BUN 26*  < > 28*  --  28* 22 19  CREATININE 0.89  < > 1.43*  < > 1.18 0.87 0.81  CALCIUM 9.2  < > 9.5  --  8.4 8.5 9.1  MG 2.3  --   --   --  1.8  --   --   PHOS  --   --   --   --  2.3  --   --   < > = values in this interval not displayed. Liver Function Tests:  Recent Labs  09/13/12 1556 09/14/12 0540 10/03/12 1720  AST 18 20 23   ALT 10 11 16   ALKPHOS 83 80 93  BILITOT 0.3 0.3 0.7  PROT 8.6* 8.3 7.5  ALBUMIN 2.1* 2.2* 2.4*    Recent Labs  09/13/12 1556  LIPASE 28   No results found for this basename: AMMONIA,  in the last 8760 hours CBC:  Recent Labs  09/13/12 1556  09/20/12 0801  10/03/12 1720  10/05/12 0400 10/06/12 0545 10/07/12 0558  WBC 21.7*  < > 5.2  < > 12.6*  < > 8.6 8.7 6.8  NEUTROABS 17.9*  --  2.3  --  10.1*  --   --   --   --   HGB 10.9*  < > 9.7*  < > 10.9*  < > 8.0* 8.8* 8.9*  HCT 33.3*  < > 30.1*  < > 33.5*  < > 25.1* 28.1* 27.3*  MCV 77.6*  < > 78.4  < > 80.1  < > 81.2 80.1 78.9  PLT 351  < > 325  < > 208  < > 139* 170 168  < > = values in this interval not displayed. Cardiac Enzymes:  Recent Labs  04/30/12 1805  TROPONINI <0.30   CBG:  Recent Labs  10/07/12 0429 10/07/12 0848 10/07/12 1348  GLUCAP 136* 123* 157*    Radiological Exams: Ct Abdomen Pelvis W Contrast   10/03/2012   *RADIOLOGY REPORT*  Clinical Data: Status post placement of left upper extremity midline catheter to the level of the axillary vein for IV vancomycin administration.  The catheter could not be advanced centrally due to underlying venous stenosis.  There is inability to aspirate blood from the catheter currently.  IR FLOURO RM 0-60 MIN  Fluoro time:  3 seconds.  Comparison: Imaging during midline catheter placement on 09/22/2012.  Findings: Fluoroscopy was performed showing stable positioning of the midline catheter at the level of the rib  margin on the left within the axillary vein.  Aspiration did not yield blood.  The catheter flushed easily.  The catheter was retracted by a few centimeters and additional manipulation did not yield blood with aspiration.  During manipulation, the patient experience significant nausea and vomiting and had to be suctioned.  He could not tolerate any further manipulation or replacement of the catheter.  This catheter is clearly intravascular and can be used for vancomycin administration.  IMPRESSION: Left arm midline catheter tip is within the axillary vein.  This catheter flushed easily.  With retraction, there was persistent inability to aspirate blood, likely reflecting fibrin sheath at the tip of the catheter.  Manipulation of the catheter had to be abruptly discontinued due to development of nausea with active vomiting requiring suctioning of the oropharynx.  The catheter can  continue to be used for IV administration currently.   Original Report Authenticated By: Irish Lack, M.D.    Dg Chest Port 1 View  10/05/2012   *RADIOLOGY REPORT*  Clinical Data: Pneumonia and sepsis.  PORTABLE CHEST - 1 VIEW  Comparison: 10/03/2012  Findings: Patchy airspace disease of the right upper and perihilar lung again noted.  There does appear to be some subtle nodularity in the right lung as well, and continued follow-up is recommended as underlying pulmonary nodules are not excluded.  There is additional atelectasis versus infiltrate at the left lung base.  No pulmonary edema or significant pleural fluid is identified.  The heart size and appearance of a pacemaker are stable.  IMPRESSION: Persistent patchy infiltrate of the right lung and increase in atelectasis/infiltrate at the left lung base.  There is suggestion of nodularity in the right lung and continued follow-up is recommended.   Original Report Authenticated By: Irish Lack, M.D.   Dg Chest Portable 1 View  10/03/2012   *RADIOLOGY REPORT*  Clinical Data:  Central line placement.  Sepsis.  PORTABLE CHEST - 1 VIEW 6:43 p.m.  Comparison: 10/03/2012 at 5:28 p.m.  Findings: Right jugular vein catheter has been inserted and the tip is in the superior vena cava at the cavoatrial junction in good position.  No pneumothorax.  Faint infiltrate in the right upper lobe.  Lungs otherwise clear.  Heart size and vascularity are normal.  Dual lead pacer in place.  No acute osseous abnormality.  Fairly severe arthritis of the right shoulder joint.  IMPRESSION:  1.  Central line good position with no pneumothorax. 2.  Right upper lobe infiltrate.   Original Report Authenticated By: Francene Boyers, M.D.   Dg Chest Port 1 View  10/03/2012   *RADIOLOGY REPORT*  Clinical Data: Sepsis and weakness  PORTABLE CHEST - 1 VIEW  Comparison: 09/13/2012  Findings: Right chest wall pacer device is noted with lead in the right atrial appendage and right ventricle.  Heart size is normal. Bilateral hazy lung opacities are identified and appear upper lobe predominant. No pleural effusion identified.  Postsurgical changes within the cervical spine noted compatible with anterior and posterior fusion.  IMPRESSION:  1.  Bilateral hazy lung opacities which may indicate multifocal pneumonia or edema.   Original Report Authenticated By: Signa Kell, M.D.    Assessment/Plan  Sepsis- his HCAP, aspiration pneumonitis and osteomyelitis all could have contributed to this. On antibiotics at present until 10/15/12. Continue meropenem and vancomycin ad monitor vancomycin trough.   Dysphagia- has a peg tube. Will continue scopolamine patch. Continue diabetic source bolus tid Continue prostat supplement. Aspiration precautions to be taken  Sacral osteomyelitis- complete course of vancomycin and meropenem.Monitor wbc and temp curve as well  HTN- continue lopressor, monitor bp  Sacral pressure ulcer- followed by wound care. To complete course of antibiotic. Continue care for foley, colsotomy site, PICC  line site and peg tube site. Continue tyleonol prn, tramadol and gabapentin. continue folic acid and vit c supplement.   DM type 2- continue current regimen of lantus and SSI, recent a1c 8.7, monitor cbg. Continue gabapentin for peripheral neuropathy  Hypothyroidism- monitor tsh, continue levothyroxine and liothyronine for now  Depression- continue celexa and trazodone, monitor  Anemia- continue ferrous sulfate and monitor cbc  Family/ staff Communication: reveiwed care plan with patient and nursing supervisor   Labs/tests ordered- cbc, cmp, vancomycin trough

## 2012-10-16 ENCOUNTER — Other Ambulatory Visit: Payer: Self-pay

## 2012-10-16 LAB — BASIC METABOLIC PANEL
Anion Gap: 5 — ABNORMAL LOW (ref 7–16)
EGFR (African American): >60
Osmolality: 287 (ref 275–301)
Potassium: 4.6 mmol/L (ref 3.5–5.1)
Sodium: 143 mmol/L (ref 136–145)

## 2012-10-16 LAB — VANCOMYCIN, TROUGH: Vancomycin, Trough: 22 ug/mL (ref 10–20)

## 2012-10-19 ENCOUNTER — Other Ambulatory Visit: Payer: Self-pay

## 2012-10-19 LAB — BASIC METABOLIC PANEL
Anion Gap: 5 — ABNORMAL LOW (ref 7–16)
BUN: 25 mg/dL — ABNORMAL HIGH (ref 7–18)
Chloride: 110 mmol/L — ABNORMAL HIGH (ref 98–107)
Co2: 29 mmol/L (ref 21–32)
Creatinine: 0.97 mg/dL (ref 0.60–1.30)
EGFR (Non-African Amer.): >60
Glucose: 71 mg/dL (ref 65–99)
Osmolality: 290 (ref 275–301)
Potassium: 4.5 mmol/L (ref 3.5–5.1)
Sodium: 144 mmol/L (ref 136–145)

## 2012-10-28 ENCOUNTER — Telehealth: Payer: Self-pay | Admitting: *Deleted

## 2012-10-28 ENCOUNTER — Ambulatory Visit: Payer: Medicare Other | Admitting: Internal Medicine

## 2012-10-28 NOTE — Telephone Encounter (Signed)
Per Dr. Orvan Falconer, schedule f/u visit with a [facility] MD.  Dr Orvan Falconer does not have any appts until August.  Scheduled pt w/ Dr. Drue Second for Thurs., July 16 @ 0930.

## 2012-10-29 ENCOUNTER — Ambulatory Visit (INDEPENDENT_AMBULATORY_CARE_PROVIDER_SITE_OTHER): Payer: Medicare Other | Admitting: Internal Medicine

## 2012-10-29 VITALS — BP 133/86 | HR 67 | Temp 96.9°F | Ht 74.5 in | Wt 210.0 lb

## 2012-10-29 DIAGNOSIS — M869 Osteomyelitis, unspecified: Secondary | ICD-10-CM

## 2012-10-29 LAB — CBC WITH DIFFERENTIAL/PLATELET
Basophils Absolute: 0.1 10*3/uL (ref 0.0–0.1)
Basophils Relative: 1 % (ref 0–1)
Eosinophils Absolute: 0.5 10*3/uL (ref 0.0–0.7)
Eosinophils Relative: 10 % — ABNORMAL HIGH (ref 0–5)
MCH: 26.2 pg (ref 26.0–34.0)
MCHC: 32.1 g/dL (ref 30.0–36.0)
Neutrophils Relative %: 45 % (ref 43–77)
Platelets: 203 10*3/uL (ref 150–400)
RBC: 4.54 MIL/uL (ref 4.22–5.81)
RDW: 19.5 % — ABNORMAL HIGH (ref 11.5–15.5)

## 2012-10-29 LAB — SEDIMENTATION RATE: Sed Rate: 13 mm/hr (ref 0–16)

## 2012-10-29 LAB — C-REACTIVE PROTEIN: CRP: 1.8 mg/dL — ABNORMAL HIGH (ref <0.60)

## 2012-10-30 LAB — BASIC METABOLIC PANEL WITH GFR
Calcium: 9.4 mg/dL (ref 8.4–10.5)
Creat: 1.04 mg/dL (ref 0.50–1.35)
GFR, Est African American: 81 mL/min
Sodium: 140 mEq/L (ref 135–145)

## 2012-10-31 NOTE — Progress Notes (Signed)
[facility] FOLLOW UP NOTE  RFV: chronic pelvic osteomyeltis and klebsiella bacteremia Subjective:    Patient ID: [name], male    DOB: 10-25-1935, 77 y.o.   MRN: [id number]  HPI [name] is a 77yo AAM with hx of stroke, enterococcal bacteremia, chronic pelvic osteomyelitis, who was recently hospitalized for  ESBL Klebsiella bacteremia thought to be due to chronic pelvic osteomyelitis. He started on antibiotics and repeat blood cultures were negative. A biopsy of the left sacroiliac joint obtained after antibiotics started was culture negative. There is no evidence of pacemaker endocarditis or native valve endocarditis by transthoracic echocardiography. He was discharged with plan to treat with 6 weeks of meropenem and vancomycin which finished on July 12th. He states that his wound is changed daily with wound care nurse and evaluated weekly by wound care doctor at the facility  He has had no problems tolerating his PICC or antibiotics. He has not had any fever.  he also goes to the local wound care center sporadically.no worsening drainage from his sacral wound per his report.  Current Outpatient Prescriptions on File Prior to Visit  Medication Sig Dispense Refill  . acetaminophen (TYLENOL) 160 MG/5ML liquid Give 640 mg by tube every 4 (four) hours as needed for fever.       Marland Kitchen antiseptic oral rinse (BIOTENE) LIQD 15 mLs by Mouth Rinse route 2 times daily at 12 noon and 4 pm.  237 mL    . citalopram (CELEXA) 10 MG tablet 10 mg by PEG Tube route every morning.       . feeding supplement (PRO-STAT SUGAR FREE 64) LIQD Place 30 mLs into feeding tube 3 (three) times daily with meals.      . ferrous sulfate 300 (60 FE) MG/5ML syrup Give 300 mg by tube 2 (two) times daily.        Marland Kitchen gabapentin (NEURONTIN) 300 MG capsule Give 300 mg by tube at bedtime.       Marland Kitchen glycopyrrolate (ROBINUL) 1 MG tablet 1 mg by PEG Tube route 3 (three) times daily.      . insulin aspart (NOVOLOG) 100 UNIT/ML injection  Inject 0-15 Units into the skin 3 (three) times daily before meals. And at bedtime.   CBG 70 - 120: 0 units CBG 121 - 150: 2 units CBG 151 - 200: 3 units CBG 201 - 250: 5 units CBG 251 - 300: 8 units CBG 301 - 350: 11 units CBG 351 - 400: 15 units  1 vial  12  . insulin glargine (LANTUS) 100 UNIT/ML injection Inject 20 Units into the skin at bedtime.      Marland Kitchen levothyroxine (SYNTHROID, LEVOTHROID) 125 MCG tablet Give 125 mcg by tube every morning.       Marland Kitchen liothyronine (CYTOMEL) 25 MCG tablet 25 mcg by PEG Tube route every morning.       . metoprolol tartrate (LOPRESSOR) 25 mg/10 mL SUSP Place 10 mLs (25 mg total) into feeding tube 2 (two) times daily.      . Multiple Vitamins-Minerals (MULTIVITAMINS THER. W/MINERALS) TABS 1 tablet by PEG Tube route every morning.       . potassium chloride 20 MEQ/15ML (10%) solution Give 20 mEq by tube every morning.       Marland Kitchen scopolamine (TRANSDERM-SCOP) 1.5 MG Place 1 patch onto the skin every 3 (three) days.      . Thiamine HCl (VITAMIN B-1) 100 MG tablet 100 mg by PEG Tube route every morning.       Marland Kitchen  traMADol (ULTRAM) 50 MG tablet 50 mg by PEG Tube route every 6 (six) hours as needed for pain.      . traZODone (DESYREL) 50 MG tablet Give 25 mg by tube at bedtime. For insomnia      . vitamin C (ASCORBIC ACID) 500 MG tablet Give 500 mg by tube 2 (two) times daily.        No current facility-administered medications on file prior to visit.   Active Ambulatory Problems    Diagnosis Date Noted  . Dysphagia due to old stroke 02/19/2011  . History of stroke with residual effects 02/19/2011  . Hypothyroidism 02/19/2011  . DM (diabetes mellitus), secondary, uncontrolled, with peripheral vascular complications 02/19/2011  . Hypertension 02/19/2011  . Endocarditis of native valve 02/20/2011  . Pacemaker 05/22/2011  . Altered mental state 02/12/2012  . Lactic acidosis 02/12/2012  . Sacral decubitus ulcer, stage IV 02/12/2012  . uti 02/12/2012  . Back pain  02/14/2012  . Hematemesis 04/13/2012  . Hyperkalemia 04/13/2012  . SIRS (systemic inflammatory response syndrome) 05/01/2012  . Aspiration pneumonia 05/01/2012  . Nausea & vomiting 05/01/2012  . Chronic anemia 05/01/2012  . Clostridium difficile colitis 05/01/2012  . Acute respiratory failure with hypoxia 05/01/2012  . HCAP (healthcare-associated pneumonia) 05/01/2012  . Fever 05/01/2012  . Leukocytosis 05/01/2012  . Dehydration 05/01/2012  . Metabolic encephalopathy 05/01/2012  . Sepsis 09/14/2012  . Decubitus ulcer of buttock, stage 4 09/14/2012  . AKI (acute kidney injury) 09/14/2012  . Bacteremia 09/15/2012  . Malnutrition of moderate degree 09/15/2012  . Decubitus ulcer of sacral region, stage 3 10/03/2012  . Sacral osteomyelitis 10/03/2012  . Severe sepsis 10/03/2012   Resolved Ambulatory Problems    Diagnosis Date Noted  . GI bleeding 02/19/2011  . Delirium, acute 02/19/2011   Past Medical History  Diagnosis Date  . Stroke   . Endocarditis, mitral valve, syphilitic   . Anorexia   . Dysphagia   . Reflux   . CVA (cerebral vascular accident)   . Pressure ulcer   . Diabetes mellitus, type 2   . Anemia   . Stroke       Review of Systems 12 point ROS reviewed, pain on left hip associated with decubitus ulcer. No fever, chills.    Objective:   Physical Exam BP 133/86  Pulse 67  Temp(Src) 96.9 F (36.1 C) (Axillary)  Ht 6' 2.5" (1.892 m)  Wt 210 lb (95.255 kg)  BMI 26.61 kg/m2 Physical Exam  Constitutional: He is oriented to person, place, and time. Appears chronically ill HENT:  Mouth/Throat: Oropharynx is clear and moist. No oropharyngeal exudate.  Cardiovascular: Normal rate, regular rhythm and normal heart sounds. Exam reveals no gallop and no friction rub.  No murmur heard.  Chest wall: picc line in right upper chest wall Pulmonary/Chest: Effort normal and breath sounds normal. No respiratory distress Abdominal: Soft. Bowel sounds are normal. He  exhibits no distension. There is no tenderness. Ostomy in LLQ with soft stool Pelvis = not examined at this time Lymphadenopathy:  no cervical adenopathy.  Skin: Skin is warm and dry. No rash noted. No erythema.  Psychiatric: a normal mood and affect.  behavior is normal.   Lab Results  Component Value Date   ESRSEDRATE 13 10/29/2012   Lab Results  Component Value Date   CRP 1.8* 10/29/2012       Assessment & Plan:  ESBL klebsiella bacteremia = treated  Complicated chronic sacral osteomyelitis = recently finished 6 wk course of  therapy. Sed rate normalized still elevated. It is ok to not re-initiate antibiotics. Will ask them to have wound care send [place] photos and measurements of wound to see how it is improving. Will not place on chronic suppressive therapy at this time unless his wound bed is getting worse. Devising an oral regimen will be challenging if we think that ESBL kleb is involved.  rtc in 4 wks

## 2012-11-05 ENCOUNTER — Non-Acute Institutional Stay (SKILLED_NURSING_FACILITY): Payer: Medicare Other | Admitting: Adult Health

## 2012-11-05 ENCOUNTER — Encounter: Payer: Self-pay | Admitting: Adult Health

## 2012-11-05 DIAGNOSIS — E039 Hypothyroidism, unspecified: Secondary | ICD-10-CM

## 2012-11-05 DIAGNOSIS — E1351 Other specified diabetes mellitus with diabetic peripheral angiopathy without gangrene: Secondary | ICD-10-CM

## 2012-11-05 DIAGNOSIS — G609 Hereditary and idiopathic neuropathy, unspecified: Secondary | ICD-10-CM

## 2012-11-05 DIAGNOSIS — I69391 Dysphagia following cerebral infarction: Secondary | ICD-10-CM

## 2012-11-05 DIAGNOSIS — F329 Major depressive disorder, single episode, unspecified: Secondary | ICD-10-CM

## 2012-11-05 DIAGNOSIS — I798 Other disorders of arteries, arterioles and capillaries in diseases classified elsewhere: Secondary | ICD-10-CM

## 2012-11-05 DIAGNOSIS — F32A Depression, unspecified: Secondary | ICD-10-CM

## 2012-11-05 DIAGNOSIS — IMO0002 Reserved for concepts with insufficient information to code with codable children: Secondary | ICD-10-CM

## 2012-11-05 DIAGNOSIS — G629 Polyneuropathy, unspecified: Secondary | ICD-10-CM | POA: Insufficient documentation

## 2012-11-05 DIAGNOSIS — K279 Peptic ulcer, site unspecified, unspecified as acute or chronic, without hemorrhage or perforation: Secondary | ICD-10-CM

## 2012-11-05 DIAGNOSIS — G4700 Insomnia, unspecified: Secondary | ICD-10-CM

## 2012-11-05 DIAGNOSIS — D649 Anemia, unspecified: Secondary | ICD-10-CM

## 2012-11-05 DIAGNOSIS — E876 Hypokalemia: Secondary | ICD-10-CM | POA: Insufficient documentation

## 2012-11-05 DIAGNOSIS — I69991 Dysphagia following unspecified cerebrovascular disease: Secondary | ICD-10-CM

## 2012-11-05 DIAGNOSIS — I10 Essential (primary) hypertension: Secondary | ICD-10-CM

## 2012-11-05 NOTE — Assessment & Plan Note (Signed)
Is stable will continue lopressor 25 mg twice daily and will monitor

## 2012-11-05 NOTE — Assessment & Plan Note (Signed)
Will continue celexa 10 mg daily and will monitor

## 2012-11-05 NOTE — Assessment & Plan Note (Signed)
Will continue lantus 20 units daily will stop the humalog ssi will begin humalog 5 units prior to meals for cbg >=150 and will monitor his status

## 2012-11-05 NOTE — Assessment & Plan Note (Signed)
Is stable will continue robinul 1 mg three times daily and will monitor

## 2012-11-05 NOTE — Assessment & Plan Note (Addendum)
Is stable will continue neurontin 300 mg nightly will continue ultram 50 mg every 6 hours as needed and will monitor

## 2012-11-05 NOTE — Assessment & Plan Note (Signed)
Will continue k+ 20 meq daily and will monitor

## 2012-11-05 NOTE — Assessment & Plan Note (Addendum)
No signs of aspiration present; continues with peg tube feedings; has scopolamine patch every 3 days for increased secretions  will not make changes will continue to monitor his status

## 2012-11-05 NOTE — Assessment & Plan Note (Addendum)
Will continue synthroid 125 mcg daily and cytomel 25 mcg daily and will monitor

## 2012-11-05 NOTE — Assessment & Plan Note (Signed)
Is stable will continue trazodone 25 mg nightly  

## 2012-11-05 NOTE — Progress Notes (Signed)
Patient ID: [name], male   DOB: [birth year], 77 y.o.   MRN: [id number]  [facility] No Known Allergies   Chief Complaint  Patient presents with  . Medical Managment of Chronic Issues    HPI: He is bieng seen for the management of his chronic illnesses. There are no concerns being voiced by the nursing staff. He cannot fully participate in the hpi or ros. He has a long term sacral wound for which the wound physician  is following.   Past Medical History  Diagnosis Date  . Stroke     Hx of with right hemiparesis  . Hypertension   . Endocarditis, mitral valve, syphilitic   . Hypothyroidism   . Anorexia   . Dysphagia   . Reflux   . Bacteremia   . Clostridium difficile colitis   . CVA (cerebral vascular accident)   . Pressure ulcer   . Diabetes mellitus, type 2   . Anemia   . Stroke     Past Surgical History  Procedure Laterality Date  . Colostomy    . Peg placement    . Hernia repair      VITAL SIGNS BP 128/80  Pulse 88  Ht 6\' 2"  (1.88 m)  Wt 198 lb 12.8 oz (90.175 kg)  BMI 25.51 kg/m2   Patient's Medications  New Prescriptions   No medications on file  Previous Medications   ACETAMINOPHEN (TYLENOL) 160 MG/5ML LIQUID    Give 640 mg by tube every 4 (four) hours as needed for fever.    ANTISEPTIC ORAL RINSE (BIOTENE) LIQD    15 mLs by Mouth Rinse route 2 times daily at 12 noon and 4 pm.   CITALOPRAM (CELEXA) 10 MG TABLET    10 mg by PEG Tube route every morning.    FEEDING SUPPLEMENT (PRO-STAT SUGAR FREE 64) LIQD    Place 30 mLs into feeding tube 3 (three) times daily with meals.   FERROUS SULFATE 300 (60 FE) MG/5ML SYRUP    Give 300 mg by tube 2 (two) times daily.     GABAPENTIN (NEURONTIN) 300 MG CAPSULE    Give 300 mg by tube at bedtime.    GLYCOPYRROLATE (ROBINUL) 1 MG TABLET    1 mg by PEG Tube route 3 (three) times daily.   INSULIN GLARGINE (LANTUS) 100 UNIT/ML INJECTION    Inject 20 Units into the skin at bedtime.   INSULIN LISPRO (HUMALOG) 100  UNIT/ML INJECTION    Inject 3-15 Units into the skin 3 (three) times daily before meals. Per ssi   LEVOTHYROXINE (SYNTHROID, LEVOTHROID) 125 MCG TABLET    Give 125 mcg by tube every morning.    LIOTHYRONINE (CYTOMEL) 25 MCG TABLET    25 mcg by PEG Tube route every morning.    METOPROLOL TARTRATE (LOPRESSOR) 25 MG/10 ML SUSP    Place 10 mLs (25 mg total) into feeding tube 2 (two) times daily.   MULTIPLE VITAMINS-MINERALS (MULTIVITAMINS THER. W/MINERALS) TABS    1 tablet by PEG Tube route every morning.    POTASSIUM CHLORIDE 20 MEQ/15ML (10%) SOLUTION    Give 20 mEq by tube every morning.    SCOPOLAMINE (TRANSDERM-SCOP) 1.5 MG    Place 1 patch onto the skin every 3 (three) days.   THIAMINE HCL (VITAMIN B-1) 100 MG TABLET    100 mg by PEG Tube route every morning.    TRAMADOL (ULTRAM) 50 MG TABLET    50 mg by PEG Tube route every 6 (six)  hours as needed for pain.   TRAZODONE (DESYREL) 50 MG TABLET    Give 25 mg by tube at bedtime. For insomnia   VITAMIN C (ASCORBIC ACID) 500 MG TABLET    Give 500 mg by tube 2 (two) times daily.   Modified Medications   No medications on file  Discontinued Medications   INSULIN ASPART (NOVOLOG) 100 UNIT/ML INJECTION    Inject 0-15 Units into the skin 3 (three) times daily before meals. And at bedtime.   CBG 70 - 120: 0 units CBG 121 - 150: 2 units CBG 151 - 200: 3 units CBG 201 - 250: 5 units CBG 251 - 300: 8 units CBG 301 - 350: 11 units CBG 351 - 400: 15 units    SIGNIFICANT DIAGNOSTIC EXAMS   LABS REVIEWED: he will decline labs at times   6-13-14wbc 5.1; hgb 10.3; hct 33.4; mcv 82.3; plt 314 glucose 115; bun 18; creat 0.8; k+4.1;na++141: hgb a1c 6.3  10-24-12: glucose 50; bun 28; creat 0.9; k+4.5;na++ 140 10-29-12: wbc 5.0; hgb 11.9; hct 37.1; mcv 81.7;plt 203;glcuose 79; bun27; creat 1.07; k+4.5;na++140 crp 1.8; sed rate 13  Review of Systems  Unable to perform ROS   Physical Exam  Constitutional: He appears well-developed and well-nourished.   Neck: Neck supple. No JVD present. No thyromegaly present.  Cardiovascular: Normal rate, regular rhythm and intact distal pulses.   Respiratory: Effort normal and breath sounds normal. No respiratory distress.  GI: Soft. Bowel sounds are normal. He exhibits no distension. There is no tenderness.  Has peg tube  Genitourinary:  Has foley  Musculoskeletal:  Is able to move extremities  Neurological: He is alert.  Skin: Skin is warm and dry.  Psychiatric: He has a normal mood and affect.  Has long term sacral wound; is being followed by wound physician. No signs of infection present is being treated with santyl      ASSESSMENT/ PLAN:  Dysphagia due to old stroke No signs of aspiration present; continues with peg tube feedings; has scopolamine patch every 3 days for increased secretions  will not make changes will continue to monitor his status   DM (diabetes mellitus), secondary, uncontrolled, with peripheral vascular complications Will continue lantus 20 units daily will stop the humalog ssi will begin humalog 5 units prior to meals for cbg >=150 and will monitor his status   Depression Will continue celexa 10 mg daily and will monitor  Hypothyroidism Will continue synthroid 125 mcg daily and cytomel 25 mcg daily and will monitor  Hypokalemia Will continue k+ 20 meq daily and will monitor  PUD (peptic ulcer disease) Is stable will continue robinul 1 mg three times daily and will monitor  Chronic anemia Is stable will continue iron twice daily   Hypertension Is stable will continue lopressor 25 mg twice daily and will monitor  Peripheral neuropathy Is stable will continue neurontin 300 mg nightly will continue ultram 50 mg every 6 hours as needed and will monitor  Insomnia Is stable will continue trazodone 25 mg nightly     Time spent with patient 50 minutes

## 2012-11-05 NOTE — Assessment & Plan Note (Signed)
Is stable will continue iron twice daily

## 2012-11-06 ENCOUNTER — Encounter: Payer: Self-pay | Admitting: Internal Medicine

## 2012-11-10 ENCOUNTER — Encounter: Payer: Self-pay | Admitting: Internal Medicine

## 2012-11-13 ENCOUNTER — Ambulatory Visit (INDEPENDENT_AMBULATORY_CARE_PROVIDER_SITE_OTHER): Payer: Medicare Other | Admitting: Internal Medicine

## 2012-11-13 ENCOUNTER — Encounter: Payer: Self-pay | Admitting: Internal Medicine

## 2012-11-13 VITALS — BP 165/103 | HR 72 | Temp 97.5°F

## 2012-11-13 DIAGNOSIS — M4628 Osteomyelitis of vertebra, sacral and sacrococcygeal region: Secondary | ICD-10-CM

## 2012-11-13 DIAGNOSIS — M869 Osteomyelitis, unspecified: Secondary | ICD-10-CM

## 2012-11-13 LAB — CBC WITH DIFFERENTIAL/PLATELET
Basophils Absolute: 0.1 10*3/uL (ref 0.0–0.1)
Basophils Relative: 1 % (ref 0–1)
Eosinophils Absolute: 0.3 10*3/uL (ref 0.0–0.7)
MCH: 26.9 pg (ref 26.0–34.0)
MCHC: 33.3 g/dL (ref 30.0–36.0)
Monocytes Absolute: 0.5 10*3/uL (ref 0.1–1.0)
Monocytes Relative: 9 % (ref 3–12)
Neutro Abs: 3.1 10*3/uL (ref 1.7–7.7)
Neutrophils Relative %: 57 % (ref 43–77)
RDW: 19.7 % — ABNORMAL HIGH (ref 11.5–15.5)

## 2012-11-13 LAB — C-REACTIVE PROTEIN: CRP: 4.2 mg/dL — ABNORMAL HIGH (ref <0.60)

## 2012-11-13 LAB — SEDIMENTATION RATE: Sed Rate: 53 mm/hr — ABNORMAL HIGH (ref 0–16)

## 2012-11-13 MED ORDER — KETOROLAC TROMETHAMINE 30 MG/ML IJ SOLN
30.0000 mg | Freq: Once | INTRAMUSCULAR | Status: AC
  Administered 2012-11-13: 30 mg via INTRAVENOUS

## 2012-11-13 NOTE — Progress Notes (Signed)
[facility] NOTE  RFV: hospital follow up for sacral osteomyelitis and decubitus ulcer Subjective:    Patient ID: [name], male    DOB: 06-Aug-1935, 77 y.o.   MRN: [id number]  HPI  [name] is a 77yo AAM with hx of stroke, enterococcal bacteremia, chronic pelvic osteomyelitis, who was recently hospitalized for ESBL Klebsiella bacteremia thought to be due to chronic pelvic osteomyelitis. He started on antibiotics and repeat blood cultures were negative. A biopsy of the left sacroiliac joint obtained after antibiotics started was culture negative. There is no evidence of pacemaker endocarditis or native valve endocarditis by transthoracic echocardiography. He was discharged with plan to treat with 6 weeks of meropenem and vancomycin which finished on July 12th.   His wounds are changed daily with wound care nurse and evaluated weekly by wound care doctor at the facility. no worsening drainage from his sacral wound per his report.  He was seen 2 weeks ago and inflammatory markers looked good at that time. He comes back to clinic with his wife for evaluation. He reports that things are good no worsening of decub ulcer. No fever or chills.  By the records it appears that he has stage 4 pressure ulceration of sacral area. 30% slough covered/70% granulation with some undermining noted starting at 2-5  Oclock. Edges continue to have white rolled edges that are irregular shaped. Continue with collagen, foam.   Current Outpatient Prescriptions on File Prior to Visit  Medication Sig Dispense Refill  . acetaminophen (TYLENOL) 160 MG/5ML liquid Give 640 mg by tube every 4 (four) hours as needed for fever.       Marland Kitchen antiseptic oral rinse (BIOTENE) LIQD 15 mLs by Mouth Rinse route 2 times daily at 12 noon and 4 pm.  237 mL    . citalopram (CELEXA) 10 MG tablet 10 mg by PEG Tube route every morning.       . feeding supplement (PRO-STAT SUGAR FREE 64) LIQD Place 30 mLs into feeding tube 3 (three) times daily  with meals.      . ferrous sulfate 300 (60 FE) MG/5ML syrup Give 300 mg by tube 2 (two) times daily.        Marland Kitchen gabapentin (NEURONTIN) 300 MG capsule Give 300 mg by tube at bedtime.       Marland Kitchen glycopyrrolate (ROBINUL) 1 MG tablet 1 mg by PEG Tube route 3 (three) times daily.      . insulin glargine (LANTUS) 100 UNIT/ML injection Inject 20 Units into the skin at bedtime.      . insulin lispro (HUMALOG) 100 UNIT/ML injection Inject 3-15 Units into the skin 3 (three) times daily before meals. Per ssi      . levothyroxine (SYNTHROID, LEVOTHROID) 125 MCG tablet Give 125 mcg by tube every morning.       Marland Kitchen liothyronine (CYTOMEL) 25 MCG tablet 25 mcg by PEG Tube route every morning.       . metoprolol tartrate (LOPRESSOR) 25 mg/10 mL SUSP Place 10 mLs (25 mg total) into feeding tube 2 (two) times daily.      . Multiple Vitamins-Minerals (MULTIVITAMINS THER. W/MINERALS) TABS 1 tablet by PEG Tube route every morning.       . potassium chloride 20 MEQ/15ML (10%) solution Give 20 mEq by tube every morning.       Marland Kitchen scopolamine (TRANSDERM-SCOP) 1.5 MG Place 1 patch onto the skin every 3 (three) days.      . Thiamine HCl (VITAMIN B-1) 100 MG tablet  100 mg by PEG Tube route every morning.       . traMADol (ULTRAM) 50 MG tablet 50 mg by PEG Tube route every 6 (six) hours as needed for pain.      . traZODone (DESYREL) 50 MG tablet Give 25 mg by tube at bedtime. For insomnia      . vitamin C (ASCORBIC ACID) 500 MG tablet Give 500 mg by tube 2 (two) times daily.        No current facility-administered medications on file prior to visit.      Review of Systems Pain to buttocks and legs over the last day. Otherwise 12 point ROS is negative    Objective:   Physical Exam BP 165/103  Pulse 72  Temp(Src) 97.5 F (36.4 C) (Oral) Physical Exam  Constitutional:  oriented to person, place, and time. Chronically ill appearing. No distress.  HENT:  Mouth/Throat: Oropharynx is clear and moist. No oropharyngeal exudate.  No thrush Cardiovascular: Normal rate, regular rhythm and normal heart sounds. Exam reveals no gallop and no friction rub.  No murmur heard.  Pulmonary/Chest: Effort normal and breath sounds normal. No respiratory distress. He has no wheezes.  Abdominal: Soft. Bowel sounds are normal. He exhibits no distension. There is no tenderness. Ostomy and peg tube have no surrounding erythema Lymphadenopathy:  no cervical adenopathy.  Neurological: lower extremity weakness due to stroke, deconditioning, bed-bound Skin: Skin is warm and dry. No rash noted. No erythema.  Psychiatric: He has a normal mood and affect. His behavior is normal.   Sacrum = 3 x 3.2cm ulcer with 1cm undermining at 2-4 pm. Some exudate on dressing roughly 60-70% granulation tissue  Labs: Lab Results  Component Value Date   ESRSEDRATE 53* 11/13/2012   Lab Results  Component Value Date   CRP 4.2* 11/13/2012        Assessment & Plan:  Pain management = Gave 30mg  toradol in order to tolerate exam and rotation on guerney  Sacral decub wound/osteo = will check inflammatory markers . Continue with local wound care. Inflammatory markers are worsening off of therapy. We will re-initiate doxycycline 100mg  BID per peg til we see him.  rtc in 4 wk

## 2012-11-14 ENCOUNTER — Telehealth: Payer: Self-pay | Admitting: *Deleted

## 2012-11-14 NOTE — Telephone Encounter (Signed)
Per Dr Drue Second Randye Lobo place and gave a verbal order for the patient to start Doxy 100 mg tab 2x daily, per peg,  for 4 weeks.

## 2012-11-19 ENCOUNTER — Other Ambulatory Visit: Payer: Self-pay

## 2012-11-19 ENCOUNTER — Non-Acute Institutional Stay (SKILLED_NURSING_FACILITY): Payer: Medicare Other | Admitting: Adult Health

## 2012-11-19 DIAGNOSIS — L8994 Pressure ulcer of unspecified site, stage 4: Secondary | ICD-10-CM

## 2012-11-19 DIAGNOSIS — L89109 Pressure ulcer of unspecified part of back, unspecified stage: Secondary | ICD-10-CM

## 2012-11-19 DIAGNOSIS — L89154 Pressure ulcer of sacral region, stage 4: Secondary | ICD-10-CM

## 2012-12-02 ENCOUNTER — Ambulatory Visit (HOSPITAL_COMMUNITY)
Admission: RE | Admit: 2012-12-02 | Discharge: 2012-12-02 | Disposition: A | Discharge status: Home / Self Care | Payer: Medicare Other | Source: Ambulatory Visit | Attending: Internal Medicine | Admitting: Internal Medicine

## 2012-12-02 ENCOUNTER — Other Ambulatory Visit: Payer: Self-pay | Admitting: Internal Medicine

## 2012-12-02 DIAGNOSIS — R633 Feeding difficulties: Secondary | ICD-10-CM

## 2012-12-02 DIAGNOSIS — K9413 Enterostomy malfunction: Secondary | ICD-10-CM | POA: Insufficient documentation

## 2012-12-02 DIAGNOSIS — Y833 Surgical operation with formation of external stoma as the cause of abnormal reaction of the patient, or of later complication, without mention of misadventure at the time of the procedure: Secondary | ICD-10-CM | POA: Insufficient documentation

## 2012-12-02 DIAGNOSIS — K9403 Colostomy malfunction: Secondary | ICD-10-CM | POA: Insufficient documentation

## 2012-12-02 DIAGNOSIS — Y929 Unspecified place or not applicable: Secondary | ICD-10-CM | POA: Insufficient documentation

## 2012-12-02 MED ORDER — IOHEXOL 300 MG/ML  SOLN
50.0000 mL | Freq: Once | INTRAMUSCULAR | Status: AC | PRN
  Administered 2012-12-02: 1 mL via INTRAVENOUS

## 2012-12-02 NOTE — Procedures (Signed)
20 Fr balloon GJ exchange No comp

## 2012-12-08 NOTE — Progress Notes (Signed)
This encounter was created in error - please disregard.

## 2012-12-09 ENCOUNTER — Non-Acute Institutional Stay (SKILLED_NURSING_FACILITY): Payer: Medicare Other | Admitting: Adult Health

## 2012-12-09 ENCOUNTER — Encounter: Payer: Self-pay | Admitting: Adult Health

## 2012-12-09 DIAGNOSIS — I10 Essential (primary) hypertension: Secondary | ICD-10-CM

## 2012-12-09 DIAGNOSIS — D638 Anemia in other chronic diseases classified elsewhere: Secondary | ICD-10-CM

## 2012-12-09 DIAGNOSIS — E876 Hypokalemia: Secondary | ICD-10-CM

## 2012-12-09 DIAGNOSIS — I69991 Dysphagia following unspecified cerebrovascular disease: Secondary | ICD-10-CM

## 2012-12-09 DIAGNOSIS — E039 Hypothyroidism, unspecified: Secondary | ICD-10-CM

## 2012-12-09 DIAGNOSIS — G629 Polyneuropathy, unspecified: Secondary | ICD-10-CM

## 2012-12-09 DIAGNOSIS — E1159 Type 2 diabetes mellitus with other circulatory complications: Secondary | ICD-10-CM

## 2012-12-09 DIAGNOSIS — K279 Peptic ulcer, site unspecified, unspecified as acute or chronic, without hemorrhage or perforation: Secondary | ICD-10-CM

## 2012-12-09 DIAGNOSIS — G609 Hereditary and idiopathic neuropathy, unspecified: Secondary | ICD-10-CM

## 2012-12-09 MED ORDER — ATROPINE SULFATE 1 % OP SOLN: 3.0000 [drp] | Freq: Four times a day (QID) | OPHTHALMIC | Status: AC

## 2012-12-09 NOTE — Progress Notes (Signed)
Patient ID: [name], male   DOB: 10-Apr-1936, 77 y.o.   MRN: [id number]  [facility] No Known Allergies   Chief Complaint  Patient presents with  . Medical Managment of Chronic Issues    HPI: Pt is being followed for medical management for chronic illnesses.  Pt reports not feeling well, however is not able to endorse specific complaints at present.  No further issues or concerns voiced by patient or nursing staff.  Will continue to monitor and collaborate with health care team.    Past Medical History  Diagnosis Date  . Stroke     Hx of with right hemiparesis  . Hypertension   . Endocarditis, mitral valve, syphilitic   . Hypothyroidism   . Anorexia   . Dysphagia   . Reflux   . Bacteremia   . Clostridium difficile colitis   . CVA (cerebral vascular accident)   . Pressure ulcer   . Diabetes mellitus, type 2   . Anemia   . Stroke     Past Surgical History  Procedure Laterality Date  . Colostomy    . Peg placement    . Hernia repair      VITAL SIGNS BP 95/62  Pulse 70  Ht 6\' 2"  (1.88 m)  Wt 198 lb (89.812 kg)  BMI 25.41 kg/m2   Patient's Medications  New Prescriptions   No medications on file  Previous Medications   ACETAMINOPHEN (TYLENOL) 160 MG/5ML LIQUID    Give 640 mg by tube every 4 (four) hours as needed for fever.    ANTISEPTIC ORAL RINSE (BIOTENE) LIQD    15 mLs by Mouth Rinse route 2 times daily at 12 noon and 4 pm.   CITALOPRAM (CELEXA) 10 MG TABLET    10 mg by PEG Tube route every morning.    FEEDING SUPPLEMENT (PRO-STAT SUGAR FREE 64) LIQD    Place 30 mLs into feeding tube 3 (three) times daily with meals.   FERROUS SULFATE 300 (60 FE) MG/5ML SYRUP    Give 300 mg by tube 2 (two) times daily.     GABAPENTIN (NEURONTIN) 300 MG CAPSULE    Give 300 mg by tube at bedtime.    GLYCOPYRROLATE (ROBINUL) 1 MG TABLET    1 mg by PEG Tube route 3 (three) times daily.   INSULIN GLARGINE (LANTUS) 100 UNIT/ML INJECTION    Inject 20 Units into the skin at  bedtime.   INSULIN LISPRO (HUMALOG) 100 UNIT/ML INJECTION    Inject 5 Units into the skin 3 (three) times daily before meals. 5 units prior to meals for cbg >= 150   LEVOTHYROXINE (SYNTHROID, LEVOTHROID) 125 MCG TABLET    Give 125 mcg by tube every morning.    LIOTHYRONINE (CYTOMEL) 25 MCG TABLET    25 mcg by PEG Tube route every morning.    METOPROLOL TARTRATE (LOPRESSOR) 25 MG/10 ML SUSP    Place 10 mLs (25 mg total) into feeding tube 2 (two) times daily.   MULTIPLE VITAMINS-MINERALS (MULTIVITAMINS THER. W/MINERALS) TABS    1 tablet by PEG Tube route every morning.    POTASSIUM CHLORIDE 20 MEQ/15ML (10%) SOLUTION    Give 20 mEq by tube every morning.    SCOPOLAMINE (TRANSDERM-SCOP) 1.5 MG    Place 1 patch onto the skin every 3 (three) days.   THIAMINE HCL (VITAMIN B-1) 100 MG TABLET    100 mg by PEG Tube route every morning.    TRAMADOL (ULTRAM) 50 MG TABLET  50 mg by PEG Tube route every 6 (six) hours as needed for pain.   TRAZODONE (DESYREL) 50 MG TABLET    Give 25 mg by tube at bedtime. For insomnia   VITAMIN C (ASCORBIC ACID) 500 MG TABLET    Give 500 mg by tube 2 (two) times daily.   Modified Medications   No medications on file  Discontinued Medications   No medications on file    SIGNIFICANT DIAGNOSTIC EXAMS   LABS REVIEWED: he will decline labs at times   6-13-14wbc 5.1; hgb 10.3; hct 33.4; mcv 82.3; plt 314 glucose 115; bun 18; creat 0.8; k+4.1;na++141: hgb a1c 6.3  10-24-12: glucose 50; bun 28; creat 0.9; k+4.5;na++ 140 10-29-12: wbc 5.0; hgb 11.9; hct 37.1; mcv 81.7;plt 203;glcuose 79; bun27; creat 1.07; k+4.5;na++140 crp 1.8; sed rate 13    Review of Systems  HENT: Negative.  Negative for sore throat.   Eyes: Negative.   Respiratory: Negative for cough and shortness of breath.   Cardiovascular: Negative for chest pain and palpitations.  Gastrointestinal: Negative for nausea and vomiting.  Genitourinary: Negative.   Musculoskeletal: Negative.   Skin: Negative.    Neurological: Positive for focal weakness and weakness. Negative for headaches.  Endo/Heme/Allergies: Negative.   Psychiatric/Behavioral: Negative.     Physical Exam  Constitutional: He appears well-nourished.  HENT:  Head: Normocephalic.  Mouth/Throat: Abnormal dentition. Dental caries present.  Hallitosis noted and poor dentition  Eyes:  Unable assess; pt uncooperative with eye exam  Neck: Trachea normal. No JVD present. Decreased range of motion present. No tracheal deviation present.  Cardiovascular: An irregular rhythm present. PMI is displaced.  Exam reveals decreased pulses.   Pulses:      Dorsalis pedis pulses are 1+ on the right side, and 1+ on the left side.  Pacemaker noted   Respiratory: Effort normal. He has decreased breath sounds in the right lower field and the left lower field. He has wheezes in the right lower field and the left lower field. He has rhonchi in the right lower field and the left lower field. He has rales in the right lower field and the left lower field.  GI: Soft. He exhibits no ascites. There is no tenderness.  Peg/J tube to LUQ/ Colostomy to LLQ-stoma cherry red.   Genitourinary: No penile erythema or penile tenderness. No discharge found.  Foley in place  Musculoskeletal:       Right ankle: He exhibits decreased range of motion and abnormal pulse.       Left ankle: He exhibits decreased range of motion and abnormal pulse.  Foot drop boots in place  Lymphadenopathy:    He has no cervical adenopathy.  Neurological: He is alert.  Pt. Disoriented to place, time and situation  Skin: Skin is warm and dry.  Chronic sacral wound  Psychiatric: His speech is normal. He is withdrawn. He is inattentive.      ASSESSMENT/ PLAN:   Dysphagia due to old stroke No signs of aspiration present; continues with peg tube feedings; has scopolamine patch every 3 days for increased secretions. Will add  Atropine 3 gtts every 6 hours sublingual for excessive oral  secretions.  DM (diabetes mellitus), secondary, uncontrolled, with peripheral vascular complications Will continue lantus 20 units daily will stop the humalog ssi will begin humalog 5 units prior to meals for cbg >=150 and will monitor his status   Depression Will continue celexa 10 mg daily and will monitor  Hypothyroidism Will continue synthroid 125 mcg daily  and cytomel 25 mcg daily and will monitor  Hypokalemia Will continue k+ 20 meq daily and will monitor  PUD (peptic ulcer disease) Is stable will continue robinul 1 mg three times daily and will monitor  Chronic anemia Is stable will continue iron twice daily   Hypertension Is stable will continue lopressor 25 mg twice daily and will monitor  Peripheral neuropathy Is stable will continue neurontin 300 mg nightly will continue ultram 50 mg every 6 hours as needed and will monitor  Insomnia Is stable will continue trazodone 25 mg nightly

## 2012-12-11 ENCOUNTER — Other Ambulatory Visit: Payer: Self-pay | Admitting: Internal Medicine

## 2012-12-11 DIAGNOSIS — R633 Feeding difficulties: Secondary | ICD-10-CM

## 2012-12-17 ENCOUNTER — Other Ambulatory Visit: Payer: Self-pay

## 2012-12-17 MED ORDER — TRAMADOL HCL 50 MG PO TABS: ORAL_TABLET | ORAL | Status: AC

## 2012-12-17 NOTE — Telephone Encounter (Signed)
Verified dose and instructions reflect manual request received by nursing home.   

## 2012-12-18 ENCOUNTER — Ambulatory Visit (INDEPENDENT_AMBULATORY_CARE_PROVIDER_SITE_OTHER): Payer: Medicare Other | Admitting: Internal Medicine

## 2012-12-18 ENCOUNTER — Encounter: Payer: Self-pay | Admitting: Internal Medicine

## 2012-12-18 VITALS — BP 110/85 | HR 71 | Temp 99.0°F

## 2012-12-18 DIAGNOSIS — L89309 Pressure ulcer of unspecified buttock, unspecified stage: Secondary | ICD-10-CM

## 2012-12-18 LAB — BASIC METABOLIC PANEL
BUN: 32 mg/dL — ABNORMAL HIGH (ref 6–23)
CO2: 21 mEq/L (ref 19–32)
Calcium: 9.3 mg/dL (ref 8.4–10.5)
Chloride: 107 mEq/L (ref 96–112)
Creat: 1.23 mg/dL (ref 0.50–1.35)
Glucose, Bld: 98 mg/dL (ref 70–99)

## 2012-12-18 LAB — CBC WITH DIFFERENTIAL/PLATELET
Eosinophils Absolute: 0.1 10*3/uL (ref 0.0–0.7)
Eosinophils Relative: 2 % (ref 0–5)
HCT: 33.9 % — ABNORMAL LOW (ref 39.0–52.0)
Lymphocytes Relative: 34 % (ref 12–46)
Lymphs Abs: 1.9 10*3/uL (ref 0.7–4.0)
MCH: 26 pg (ref 26.0–34.0)
MCV: 80.1 fL (ref 78.0–100.0)
Monocytes Absolute: 0.7 10*3/uL (ref 0.1–1.0)
Monocytes Relative: 13 % — ABNORMAL HIGH (ref 3–12)
Platelets: 282 10*3/uL (ref 150–400)
RBC: 4.23 MIL/uL (ref 4.22–5.81)
WBC: 5.5 10*3/uL (ref 4.0–10.5)

## 2012-12-18 MED ORDER — KETOROLAC TROMETHAMINE 30 MG/ML IJ SOLN
30.0000 mg | Freq: Once | INTRAMUSCULAR | Status: AC
  Administered 2012-12-18: 30 mg via INTRAVENOUS

## 2012-12-18 NOTE — Progress Notes (Signed)
[facility] NOTE  RFV: pelvic osteomyelitis Subjective:    Patient ID: [name], male    DOB: 1935-12-04, 77 y.o.   MRN: [id number]  HPI [name] is 77yo AAM with history of stroke, endocarditis with enterococcus , residual weakness from stroke and bedbound with sacral decub/Pelvic osteo. Last seen 7/31, reinitiated doxycycline x 4 wks at that time due to how his wound appeared. Inflammatory markers elevated at that time. He presents in follow up. Wound care notes suggest that his wound has stayed stagnate, no improving significantly. Still elements of undermining  Current Outpatient Prescriptions on File Prior to Visit  Medication Sig Dispense Refill  . acetaminophen (TYLENOL) 160 MG/5ML liquid Give 640 mg by tube every 4 (four) hours as needed for fever.       Marland Kitchen antiseptic oral rinse (BIOTENE) LIQD 15 mLs by Mouth Rinse route 2 times daily at 12 noon and 4 pm.  237 mL    . atropine 1 % ophthalmic solution Place 3 drops under the tongue 4 (four) times daily.  2 mL  12  . citalopram (CELEXA) 10 MG tablet 10 mg by PEG Tube route every morning.       . feeding supplement (PRO-STAT SUGAR FREE 64) LIQD Place 30 mLs into feeding tube 3 (three) times daily with meals.      . ferrous sulfate 300 (60 FE) MG/5ML syrup Give 300 mg by tube 2 (two) times daily.        Marland Kitchen gabapentin (NEURONTIN) 300 MG capsule Give 300 mg by tube at bedtime.       Marland Kitchen glycopyrrolate (ROBINUL) 1 MG tablet 1 mg by PEG Tube route 3 (three) times daily.      . insulin glargine (LANTUS) 100 UNIT/ML injection Inject 20 Units into the skin at bedtime.      . insulin lispro (HUMALOG) 100 UNIT/ML injection Inject 5 Units into the skin 3 (three) times daily before meals. 5 units prior to meals for cbg >= 150      . levothyroxine (SYNTHROID, LEVOTHROID) 125 MCG tablet Give 125 mcg by tube every morning.       Marland Kitchen liothyronine (CYTOMEL) 25 MCG tablet 25 mcg by PEG Tube route every morning.       . metoprolol tartrate (LOPRESSOR) 25  mg/10 mL SUSP Place 10 mLs (25 mg total) into feeding tube 2 (two) times daily.      . Multiple Vitamins-Minerals (MULTIVITAMINS THER. W/MINERALS) TABS 1 tablet by PEG Tube route every morning.       . potassium chloride 20 MEQ/15ML (10%) solution Give 20 mEq by tube every morning.       Marland Kitchen scopolamine (TRANSDERM-SCOP) 1.5 MG Place 1 patch onto the skin every 3 (three) days.      . Thiamine HCl (VITAMIN B-1) 100 MG tablet 100 mg by PEG Tube route every morning.       . traMADol (ULTRAM) 50 MG tablet 1 by mouth every 6 hours as needed for pain  30 tablet  5  . traZODone (DESYREL) 50 MG tablet Give 25 mg by tube at bedtime. For insomnia      . vitamin C (ASCORBIC ACID) 500 MG tablet Give 500 mg by tube 2 (two) times daily.        No current facility-administered medications on file prior to visit.    Review of Systems Leg pain    Objective:   Physical Exam BP 110/85  Pulse 71  Temp(Src) 99 F (37.2  C) (Oral) Physical Exam  Constitutional: He is oriented to person, place, and time. He appears well-developed and well-nourished. No distress.  HENT:  Mouth/Throat: Oropharynx is clear and moist. No oropharyngeal exudate.  Cardiovascular: Normal rate, regular rhythm and normal heart sounds. Exam reveals no gallop and no friction rub.  No murmur heard.  Pulmonary/Chest: Effort normal and breath sounds normal. No respiratory distress. He has no wheezes.  Abdominal: Soft. Bowel sounds are normal. He exhibits no distension. There is no tenderness.  Ostomy in place. Foley in place Back/pelvis = as described in home health wound care notes, wound dimentions and undermining are unchanged Lymphadenopathy:  He has no cervical adenopathy.  Skin: excoriation to right arm and right chest wall        Assessment & Plan:   pelvic osteomyelitis= check sed rate and crp, cbc with diff ad bmp to see if need to consider re-initiation of antibiotics for chronic osteomyelitis  Leg pain =For his exam we gave  toradol in order to treat pain and rotate

## 2012-12-19 ENCOUNTER — Telehealth: Payer: Self-pay | Admitting: *Deleted

## 2012-12-19 NOTE — Telephone Encounter (Signed)
Called the facility to advise that Dr Drue Second wants to have a PICC placed and for him to start IV antibiotics again. Asked how they handle this and was told by supervisor to make the appt and call her back at [phone] to give them the information and they will make sure he gets there and to fax the order for meds to [phone]. Advised her will give her a call as soon as I get the appt information.   Called IR at the hospital and they were gone for the day but left a message for them to call us back asap. I will call them back first thing Monday as all of the numbers I call for radiology say only Victor[facility] Dike or someone in that department can schedule for them and It will not be placed before Monday unless the patient is inpatient anyway.

## 2012-12-23 ENCOUNTER — Telehealth: Payer: Self-pay | Admitting: *Deleted

## 2012-12-23 ENCOUNTER — Other Ambulatory Visit: Payer: Self-pay | Admitting: Internal Medicine

## 2012-12-23 ENCOUNTER — Encounter: Payer: Self-pay | Admitting: *Deleted

## 2012-12-23 DIAGNOSIS — M86652 Other chronic osteomyelitis, left thigh: Secondary | ICD-10-CM

## 2012-12-23 DIAGNOSIS — M4628 Osteomyelitis of vertebra, sacral and sacrococcygeal region: Secondary | ICD-10-CM

## 2012-12-23 NOTE — Assessment & Plan Note (Signed)
He is on doxycycline 100 mg twice daily for 4 weeks which was started on 11-14-12; will continue his abt; culture has been obtained; will check a cbc in the am; will use santyl with ca++ alginate dressing daily and will monitor his status

## 2012-12-23 NOTE — Telephone Encounter (Signed)
Called [facility] IR department to schedule this patient for PICC placement for 12/24/12 at 9 am. The patient needs to arrive at Short Stay at 730 am and nothing to eat or drink after midnight. Also faxed orders to [facility] and Short Stay where he will be getting his first dose of antibiotic.

## 2012-12-23 NOTE — Telephone Encounter (Signed)
Consuella Lose, RN at [facility] called stating she was expecting a call after Dr. Drue Second reviewed lab results. Note sent to Alesia Morin, CMA and Dr. Drue Second. #[phone], ask to have her paged. She needs to know if IV antibiotics are to be continued. Wendall Mola CMA

## 2012-12-23 NOTE — Telephone Encounter (Signed)
Called and faxed information on IV antibiotics to [facility].

## 2012-12-23 NOTE — Progress Notes (Signed)
Patient ID: [name], male   DOB: 10-Jan-1936, 77 y.o.   MRN: [id number]  [facility]  No Known Allergies   Chief Complaint  Patient presents with  . Acute Visit    wound management    HPI:  He is being seen for his chronic sacral wound. He is being followed by the wound doctor. The treatment nurse is concerned that the wound is not progressing as well as it could be. She has asked me to take a look at this wound.   Past Medical History  Diagnosis Date  . Stroke     Hx of with right hemiparesis  . Hypertension   . Endocarditis, mitral valve, syphilitic   . Hypothyroidism   . Anorexia   . Dysphagia   . Reflux   . Bacteremia   . Clostridium difficile colitis   . CVA (cerebral vascular accident)   . Pressure ulcer   . Diabetes mellitus, type 2   . Anemia   . Stroke     Past Surgical History  Procedure Laterality Date  . Colostomy    . Peg placement    . Hernia repair      VITAL SIGNS BP 118/65  Pulse 68  Ht 6\' 2"  (1.88 m)  Wt 198 lb (89.812 kg)  BMI 25.41 kg/m2   Patient's Medications  New Prescriptions   No medications on file  Previous Medications   ACETAMINOPHEN (TYLENOL) 160 MG/5ML LIQUID    Give 640 mg by tube every 4 (four) hours as needed for fever.    ANTISEPTIC ORAL RINSE (BIOTENE) LIQD    15 mLs by Mouth Rinse route 2 times daily at 12 noon and 4 pm.   ATROPINE 1 % OPHTHALMIC SOLUTION    Place 3 drops under the tongue 4 (four) times daily.   CITALOPRAM (CELEXA) 10 MG TABLET    10 mg by PEG Tube route every morning.    FEEDING SUPPLEMENT (PRO-STAT SUGAR FREE 64) LIQD    Place 30 mLs into feeding tube 3 (three) times daily with meals.   FERROUS SULFATE 300 (60 FE) MG/5ML SYRUP    Give 300 mg by tube 2 (two) times daily.     GABAPENTIN (NEURONTIN) 300 MG CAPSULE    Give 300 mg by tube at bedtime.    GLYCOPYRROLATE (ROBINUL) 1 MG TABLET    1 mg by PEG Tube route 3 (three) times daily.   INSULIN GLARGINE (LANTUS) 100 UNIT/ML INJECTION    Inject 20  Units into the skin at bedtime.   INSULIN LISPRO (HUMALOG) 100 UNIT/ML INJECTION    Inject 5 Units into the skin 3 (three) times daily before meals. 5 units prior to meals for cbg >= 150   LEVOTHYROXINE (SYNTHROID, LEVOTHROID) 125 MCG TABLET    Give 125 mcg by tube every morning.    LIOTHYRONINE (CYTOMEL) 25 MCG TABLET    25 mcg by PEG Tube route every morning.    METOPROLOL TARTRATE (LOPRESSOR) 25 MG/10 ML SUSP    Place 10 mLs (25 mg total) into feeding tube 2 (two) times daily.   MULTIPLE VITAMINS-MINERALS (MULTIVITAMINS THER. W/MINERALS) TABS    1 tablet by PEG Tube route every morning.    POTASSIUM CHLORIDE 20 MEQ/15ML (10%) SOLUTION    Give 20 mEq by tube every morning.    SCOPOLAMINE (TRANSDERM-SCOP) 1.5 MG    Place 1 patch onto the skin every 3 (three) days.   THIAMINE HCL (VITAMIN B-1) 100 MG TABLET  100 mg by PEG Tube route every morning.    TRAMADOL (ULTRAM) 50 MG TABLET    1 by mouth every 6 hours as needed for pain   TRAZODONE (DESYREL) 50 MG TABLET    Give 25 mg by tube at bedtime. For insomnia   VITAMIN C (ASCORBIC ACID) 500 MG TABLET    Give 500 mg by tube 2 (two) times daily.   Modified Medications   No medications on file  Discontinued Medications   No medications on file    SIGNIFICANT DIAGNOSTIC EXAMS    LABS REVIEWED: he will decline labs at times   6-13-14wbc 5.1; hgb 10.3; hct 33.4; mcv 82.3; plt 314 glucose 115; bun 18; creat 0.8; k+4.1;na++141: hgb a1c 6.3  10-24-12: glucose 50; bun 28; creat 0.9; k+4.5;na++ 140 10-29-12: wbc 5.0; hgb 11.9; hct 37.1; mcv 81.7;plt 203;glcuose 79; bun27; creat 1.07; k+4.5;na++140 crp 1.8; sed rate 13  Review of Systems  Unable to perform ROS   Physical Exam  Constitutional: He appears well-developed and well-nourished.  Neck: Neck supple. No JVD present. No thyromegaly present.  Cardiovascular: Normal rate, regular rhythm and intact distal pulses.   Respiratory: Effort normal and breath sounds normal. No respiratory  distress.  GI: Soft. Bowel sounds are normal. He exhibits no distension. There is no tenderness.  Has peg tube  Genitourinary:  Has foley  Musculoskeletal:  Is able to move extremities  Neurological: He is alert.  Skin: Skin is warm and dry.  Psychiatric: He has a normal mood and affect.  Has long term sacral wound: 4.8 x 3.6 x 1.4 cm there is foul drainage present.   ASSESSMENT/ PLAN:  Sacral decubitus ulcer, stage IV He is on doxycycline 100 mg twice daily for 4 weeks which was started on 11-14-12; will continue his abt; culture has been obtained; will check a cbc in the am; will use santyl with ca++ alginate dressing daily and will monitor his status

## 2012-12-24 ENCOUNTER — Ambulatory Visit (HOSPITAL_COMMUNITY)
Admission: RE | Admit: 2012-12-24 | Discharge: 2012-12-24 | Disposition: A | Discharge status: Home / Self Care | Payer: Medicare Other | Source: Ambulatory Visit | Attending: Internal Medicine | Admitting: Internal Medicine

## 2012-12-24 ENCOUNTER — Other Ambulatory Visit: Payer: Self-pay | Admitting: Internal Medicine

## 2012-12-24 ENCOUNTER — Encounter (HOSPITAL_COMMUNITY)
Admission: RE | Admit: 2012-12-24 | Discharge: 2012-12-24 | Disposition: A | Discharge status: Home / Self Care | Payer: Medicare Other | Source: Ambulatory Visit | Attending: Internal Medicine | Admitting: Internal Medicine

## 2012-12-24 ENCOUNTER — Other Ambulatory Visit: Payer: Self-pay

## 2012-12-24 DIAGNOSIS — M86659 Other chronic osteomyelitis, unspecified thigh: Secondary | ICD-10-CM | POA: Insufficient documentation

## 2012-12-24 DIAGNOSIS — M869 Osteomyelitis, unspecified: Secondary | ICD-10-CM | POA: Insufficient documentation

## 2012-12-24 DIAGNOSIS — M4628 Osteomyelitis of vertebra, sacral and sacrococcygeal region: Secondary | ICD-10-CM

## 2012-12-24 DIAGNOSIS — M86652 Other chronic osteomyelitis, left thigh: Secondary | ICD-10-CM

## 2012-12-24 LAB — BASIC METABOLIC PANEL
Anion Gap: 7 (ref 7–16)
Calcium, Total: 9 mg/dL (ref 8.5–10.1)
Chloride: 109 mmol/L — ABNORMAL HIGH (ref 98–107)
Creatinine: 1.35 mg/dL — ABNORMAL HIGH (ref 0.60–1.30)
EGFR (African American): 59 — ABNORMAL LOW
EGFR (Non-African Amer.): 51 — ABNORMAL LOW
Glucose: 129 mg/dL — ABNORMAL HIGH (ref 65–99)
Osmolality: 290 (ref 275–301)

## 2012-12-24 LAB — CBC WITH DIFFERENTIAL/PLATELET
Basophil #: 0.1 10*3/uL (ref 0.0–0.1)
Eosinophil %: 2 %
MCHC: 33.4 g/dL (ref 32.0–36.0)
MCV: 82 fL (ref 80–100)
Monocyte %: 13.3 %
Neutrophil #: 4.3 10*3/uL (ref 1.4–6.5)
RBC: 3.88 10*6/uL — ABNORMAL LOW (ref 4.40–5.90)
WBC: 7.2 10*3/uL (ref 3.8–10.6)

## 2012-12-24 MED ORDER — HEPARIN SOD (PORK) LOCK FLUSH 100 UNIT/ML IV SOLN: 250.0000 [IU] | Freq: Every day | INTRAVENOUS | Status: DC

## 2012-12-24 MED ORDER — VANCOMYCIN HCL IN DEXTROSE 1-5 GM/200ML-% IV SOLN
1000.0000 mg | Freq: Once | INTRAVENOUS | Status: AC
  Administered 2012-12-24: 11:00:00 1000 mg via INTRAVENOUS
  Filled 2012-12-24: qty 200

## 2012-12-24 MED ORDER — HEPARIN SOD (PORK) LOCK FLUSH 100 UNIT/ML IV SOLN
INTRAVENOUS | Status: AC
  Administered 2012-12-24: 250 [IU]
  Filled 2012-12-24: qty 5

## 2012-12-24 MED ORDER — HEPARIN SOD (PORK) LOCK FLUSH 100 UNIT/ML IV SOLN
250.0000 [IU] | INTRAVENOUS | Status: DC | PRN
  Administered 2012-12-24: 250 [IU]

## 2012-12-24 MED ORDER — SODIUM CHLORIDE 0.9 % IV SOLN
1.0000 g | Freq: Once | INTRAVENOUS | Status: AC
  Administered 2012-12-24: 11:00:00 1 g via INTRAVENOUS
  Filled 2012-12-24: qty 1

## 2012-12-24 NOTE — Procedures (Signed)
Successful placement of tunneled PICC with tip terminating within the mid/distal SVC. The patient tolerated the procedure well without immediate post procedural complication.  The catheter is ready for immediate use.

## 2012-12-24 NOTE — Progress Notes (Signed)
Dr Grace Isaac notified of BS 64, ordered and pt rec'd orange juice will recheck glucose after procedure

## 2012-12-25 MED FILL — Heparin Sodium (Porcine) Lock Flush IV Soln 100 Unit/ML: INTRAVENOUS | Qty: 5 | Status: AC

## 2012-12-28 ENCOUNTER — Other Ambulatory Visit: Payer: Self-pay

## 2012-12-28 LAB — CREATININE, SERUM
Creatinine: 1.19 mg/dL (ref 0.60–1.30)
EGFR (Non-African Amer.): 59 — ABNORMAL LOW

## 2012-12-30 ENCOUNTER — Other Ambulatory Visit: Payer: Self-pay | Admitting: Internal Medicine

## 2012-12-30 DIAGNOSIS — R1310 Dysphagia, unspecified: Secondary | ICD-10-CM

## 2012-12-31 ENCOUNTER — Ambulatory Visit (HOSPITAL_COMMUNITY)
Admission: RE | Admit: 2012-12-31 | Discharge: 2012-12-31 | Disposition: A | Discharge status: Home / Self Care | Payer: Medicare Other | Source: Ambulatory Visit | Attending: Internal Medicine | Admitting: Internal Medicine

## 2012-12-31 DIAGNOSIS — R1310 Dysphagia, unspecified: Secondary | ICD-10-CM

## 2012-12-31 DIAGNOSIS — Z431 Encounter for attention to gastrostomy: Secondary | ICD-10-CM | POA: Insufficient documentation

## 2012-12-31 MED ORDER — IOHEXOL 300 MG/ML  SOLN
50.0000 mL | Freq: Once | INTRAMUSCULAR | Status: AC | PRN
  Administered 2012-12-31: 10 mL via INTRAVENOUS

## 2013-01-03 ENCOUNTER — Other Ambulatory Visit: Payer: Self-pay

## 2013-01-03 LAB — BASIC METABOLIC PANEL
Anion Gap: 6 — ABNORMAL LOW (ref 7–16)
BUN: 28 mg/dL — ABNORMAL HIGH (ref 7–18)
EGFR (African American): >60
Glucose: 86 mg/dL (ref 65–99)
Osmolality: 286 (ref 275–301)
Sodium: 141 mmol/L (ref 136–145)

## 2013-01-03 LAB — VANCOMYCIN, TROUGH: Vancomycin, Trough: 22 ug/mL (ref 10–20)

## 2013-01-05 ENCOUNTER — Non-Acute Institutional Stay: Payer: Medicare Other | Admitting: Nurse Practitioner

## 2013-01-05 ENCOUNTER — Non-Acute Institutional Stay (SKILLED_NURSING_FACILITY): Payer: Medicare Other | Admitting: Nurse Practitioner

## 2013-01-05 ENCOUNTER — Non-Acute Institutional Stay: Payer: Self-pay | Admitting: Nurse Practitioner

## 2013-01-05 ENCOUNTER — Encounter: Payer: Self-pay | Admitting: Internal Medicine

## 2013-01-05 DIAGNOSIS — E1159 Type 2 diabetes mellitus with other circulatory complications: Secondary | ICD-10-CM

## 2013-01-05 DIAGNOSIS — L89154 Pressure ulcer of sacral region, stage 4: Secondary | ICD-10-CM

## 2013-01-05 DIAGNOSIS — G609 Hereditary and idiopathic neuropathy, unspecified: Secondary | ICD-10-CM

## 2013-01-05 DIAGNOSIS — M4628 Osteomyelitis of vertebra, sacral and sacrococcygeal region: Secondary | ICD-10-CM

## 2013-01-05 DIAGNOSIS — L89109 Pressure ulcer of unspecified part of back, unspecified stage: Secondary | ICD-10-CM

## 2013-01-05 DIAGNOSIS — K279 Peptic ulcer, site unspecified, unspecified as acute or chronic, without hemorrhage or perforation: Secondary | ICD-10-CM

## 2013-01-05 DIAGNOSIS — E039 Hypothyroidism, unspecified: Secondary | ICD-10-CM

## 2013-01-05 DIAGNOSIS — I69991 Dysphagia following unspecified cerebrovascular disease: Secondary | ICD-10-CM

## 2013-01-05 DIAGNOSIS — D638 Anemia in other chronic diseases classified elsewhere: Secondary | ICD-10-CM

## 2013-01-05 DIAGNOSIS — G629 Polyneuropathy, unspecified: Secondary | ICD-10-CM

## 2013-01-05 DIAGNOSIS — I10 Essential (primary) hypertension: Secondary | ICD-10-CM

## 2013-01-05 DIAGNOSIS — R7881 Bacteremia: Secondary | ICD-10-CM

## 2013-01-05 DIAGNOSIS — E876 Hypokalemia: Secondary | ICD-10-CM

## 2013-01-05 DIAGNOSIS — M869 Osteomyelitis, unspecified: Secondary | ICD-10-CM

## 2013-01-05 DIAGNOSIS — L8994 Pressure ulcer of unspecified site, stage 4: Secondary | ICD-10-CM

## 2013-01-07 NOTE — Progress Notes (Signed)
01/05/2013  MRN: [id number] Name: [name]  Sex: male Age: 77 y.o. DOB: 06-May-1935  Facility/Room: [facility], [place] [id number]  Provider: Zachery Dauer  Code Status: Full Code  Allergies: Review of patient's allergies indicates no known allergies.  Medications:   Diabeta, 85 ml per hour x 8 hour via peg tube, 30 mls H2O after each feeding Celexa 10 mg via peg tube each day Synthroid 125 mcg each am via peg tube Scopalamine transdermal, 1 5 mg patch applied to skin every three days after removing the old patch Thiamine 100 mg each a.m. Via peg tube Cytomel 25 mcg via peg tube each a.m. K-Dur 20 meq ER via peg tube each a.m. Vitamin C 500 mg via peg tube twice a day Ferrous sulfate elixir 220/39ml, 6 ml twice a day via peg tube Metoprolol susp 5mg /ml, 25mg  via peg tube twice a day Robinul 1 mg via peg tube twice a day Pro-stat 30 ml via peg tube twice a day Certagen 1 tab via peg tube each a.m. Neurontin 300mg  via peg tube for neuropathy Traxodone 20 mg 1/2 tablet at hs via peg tube Children's tylenol, 20 ml = 640 mg, via peg tube for fever or pain Tramadol 50 mg every 6 hours as needed for pain,  Lantus 100 u / ml, 20 units sub cut each pm Humalog 5 u sub cut ac CBG > 150  Chief complaint:  Management of chronic medical issues.    HPI: Patient is 77 y.o. male, AA, with severe sacral pressure ulcer present x many months.  Has had multiple rounds of antibiotics via pic lines.  Now on vancomycin and meipenum  Medical History  Past Medical History  Diagnosis Date  . Stroke     Hx of with right hemiparesis  . Hypertension   . Endocarditis, mitral valve, syphilitic   . Hypothyroidism   . Anorexia   . Dysphagia   . Reflux   . Bacteremia   . Clostridium difficile colitis   . CVA (cerebral vascular accident)   . Pressure ulcer   . Diabetes mellitus, type 2   . Anemia   . Stroke   C-diff now resolved  Impaired cognition  Surgical History:   Colostomy PEG  placement Hernia repair Last PICC line placed 12/18/2012  Review of Systems  DATA OBTAINED: from patient, nurse, medical record, family member  General:  Alert, neat and well groomed, in no acute distress   SKIN:  Large sacral pressure ulcer, otherwise no reported problems Musculoskeletal:  Multiple contractures, painful limbs with manifulation  Recent Labs:  12/30/12 - HgBA1c 6, in June 2014, 6.3 12/24/2012 WBC 7.2 MCH 27.3 MCHC 33.4 RDW 18 Platlet 279 % Neutrophil 60 % Lymphocyte 23.5 % Monocyte 13.3 % Eosinophil 2 % Basophil 1.2 #'s Neutorphil 4.3 Lymphocyte 1.7 Monocyte 0.9 Eosinophil 0.1 Basophil 0.1  Glucose 129 BUN 37 Creatinine 1.35 Na 140 K+ 3.9 Chloride 109 CO2 24 Calcium total 9 Osmolality 290  Vancomycin Peak and Trough managed via pharmacy  Vital Signs:          Vital Signs:    Physical Exam  GENERAL APPEARANCE: Alert, Verbally responsive in one to two word anwers. Appropriately groomed. No acute distress. Lying rigidly in bed. HEENT:  PERRLA, arcus senilus, unable to fully visualize the optic disc.  TM's unremarkable Neck:  No palpable adenopathy, no thyromegalyt Respiratory:  No distinct adventitious breath sounds, respirations unlabored Cardiovascular:  Apical pulse with occasional irregularity, other wise unremarkable.  Pt  could not tolerate manipulation of feet, to assess pulses. Gastrointestinal:  Colostomy appliance in place soft brown stool GU:  Foley draining clear yellow urine Musculoskeletal:  Legs and feet in extension contractures, verbalizes pain with any manipulation  Skin:  No new areas of skin breakdown Neurological:  No independent mobility of RUE, BLE with extension contractures, painful with any manipulation Psychiatric:  Oriented to person but not to place, unable to subtract 3 from 10 sequentially   There are no diagnoses linked to this encounter.   Cari Caraway, FNP-C  This encounter was created in error -  please disregard.

## 2013-01-09 ENCOUNTER — Telehealth: Payer: Self-pay | Admitting: Internal Medicine

## 2013-01-09 NOTE — Telephone Encounter (Signed)
I was informed that his picc line was pulled for which he was receiving vancomycin and meropenem for chronic osteomyelitis.  His wife does not want further IV antibiotics and his code has been changed to DNR but still full measure. Recommended to give bactrim through peg tube but concern that he may have recurrent c.difficile. Continue with local wound care

## 2013-01-14 ENCOUNTER — Encounter: Payer: Self-pay | Admitting: Internal Medicine

## 2013-01-14 DEATH — deceased

## 2013-01-19 ENCOUNTER — Ambulatory Visit: Payer: Medicare Other | Admitting: Internal Medicine

## 2013-02-19 NOTE — Progress Notes (Incomplete)
Patient ID: [name], male   DOB: 07/03/1935, 77 y.o.   MRN: [id number] Date of service 01/05/2013 Routine note Author:  Deeann Cree. Osborne Serio   [name] DOB:  1935/07/16 Medical record number: [id number]  Facility:  [facility], room number 1103B CODE STATUS:  Full code Allergies:  No known diagnosed allergies  Chief complaint:  Management of chronic issues  HPI  Progressive dementia, and multiple other chronic medical issues.   HPI:  He is being assessed for his chronic medical issues. Patient reports that he has no pain. He is able to verbalize yes and no.  Concerned verbalized by palliative care nurse regarding the appropriateness of continued IV vancomycin and other IV therapy. Concern related to patient sometimes refusing to have labs drawn to assess peak and trough. Patient is s/p CVA, R hemiplegia, and with progressive dementia.  Chief Complaint  Review of Systems  Constitutional: Positive for weight loss.  HENT: Negative for ear discharge and ear pain.   Eyes: Negative for discharge.  Skin:       Sacral pressure ulcer which probes to bone  Neurological: Positive for weakness. Negative for seizures and loss of consciousness.       Right hemiparalysis, very great difficulty swallowing, requires PEG tube for most of his knees Trish and and medications.  Psychiatric/Behavioral: Positive for depression.   Review of Systems  Constitutional: Positive for weight loss.  HENT: Negative for ear discharge and ear pain.   Eyes: Negative for discharge.  Skin:       Sacral pressure ulcer which probes to bone  Neurological: Positive for weakness. Negative for seizures and loss of consciousness.       Right hemiparalysis, very great difficulty swallowing, requires PEG tube for most of his knees Trish and and medications.  Psychiatric/Behavioral: Positive for depression.    Past medical history: Multisystem atrophy Stroke with history of right  hemiparesis Hypertension Endocarditis, mitral valve, syphilitic Hypothyroidism Anorexia Dysphasia Reflux Bacteremia C. difficile Pressure ulcer, sacrum, stage IV Diabetes type 2  Surgical history: Colostomy PEG placement  Current Outpatient Prescriptions on File Prior to Visit  Medication Sig Dispense Refill  . acetaminophen (TYLENOL) 160 MG/5ML liquid Give 640 mg by tube every 4 (four) hours as needed for fever.       Marland Kitchen antiseptic oral rinse (BIOTENE) LIQD 15 mLs by Mouth Rinse route 2 times daily at 12 noon and 4 pm.  237 mL    . atropine 1 % ophthalmic solution Place 3 drops under the tongue 4 (four) times daily.  2 mL  12  . citalopram (CELEXA) 10 MG tablet 10 mg by PEG Tube route every morning.       . collagenase (SANTYL) ointment Apply 1 application topically daily.      . famotidine (PEPCID) 20 MG tablet 20 mg by Gastric Tube route 2 (two) times daily.      . feeding supplement (PRO-STAT SUGAR FREE 64) LIQD Place 30 mLs into feeding tube 3 (three) times daily with meals.      . ferrous sulfate 300 (60 FE) MG/5ML syrup Give 300 mg by tube 2 (two) times daily.        Marland Kitchen gabapentin (NEURONTIN) 300 MG capsule Give 300 mg by tube at bedtime.       Marland Kitchen glycopyrrolate (ROBINUL) 1 MG tablet 1 mg by PEG Tube route 3 (three) times daily.      . insulin glargine (LANTUS) 100  UNIT/ML injection Inject 18 Units into the skin at bedtime.       . insulin lispro (HUMALOG) 100 UNIT/ML injection Inject 5 Units into the skin 3 (three) times daily before meals. 5 units prior to meals for cbg >= 150      . levothyroxine (SYNTHROID, LEVOTHROID) 125 MCG tablet Give 125 mcg by tube every morning.       Marland Kitchen liothyronine (CYTOMEL) 25 MCG tablet 25 mcg by PEG Tube route every morning.       . metoprolol tartrate (LOPRESSOR) 25 mg/10 mL SUSP Place 10 mLs (25 mg total) into feeding tube 2 (two) times daily.      . Multiple Vitamins-Minerals (MULTIVITAMINS THER. W/MINERALS) TABS 1 tablet by PEG Tube route every  morning.       . potassium chloride 20 MEQ/15ML (10%) solution Give 20 mEq by tube every morning.       Marland Kitchen scopolamine (TRANSDERM-SCOP) 1.5 MG Place 1 patch onto the skin every 3 (three) days.      . Thiamine HCl (VITAMIN B-1) 100 MG tablet 100 mg by PEG Tube route every morning.       . traMADol (ULTRAM) 50 MG tablet 1 by mouth every 6 hours as needed for pain  30 tablet  5  . traZODone (DESYREL) 50 MG tablet Give 25 mg by tube at bedtime. For insomnia      . vitamin C (ASCORBIC ACID) 500 MG tablet Give 500 mg by tube 2 (two) times daily.        No current facility-administered medications on file prior to visit.    Diagnostic exams: Noteworthy that the patient intermittently will decline labs. 12/24/2012 WBC 7.2 RBC 3.88 Hemoglobin 10.6 Hematocrit 31.7 MCV 82 MCH 27.3 MCHC 33.4 RDW 18 Platelet 279 Differential all within normal limits.  Glucose 129 BUN 37 Creatinine 1.35 Sodium 140 Potassium 3.9 Chloride 109 CO2 24  Calcium 9 Osmolality 290  12/28/2012 Vancomycin trough 38 BUN 26 Creatinine 1.19  12/30/2012 Hemoglobin A1c at 6.0, finding in June of this year 6.3. 01/03/2013 Vancomycin trough 22 Glucose 86 BUN 28 Creatinine 1.08 Sodium 141 Potassium 4.1 Chloride 110 CO2 25 Calcium 9 Osmolality 286  Vital Signs:  BP 102/64, Pulse 69, RR 18, Pulse Ox 95%  Objective:   Physical Exam  Constitutional:  Patient lying in bed, status post CVA with right hemiplegia. Makes good eye contact, voice is very weak and difficult to understand.  HENT:  Head: Normocephalic and atraumatic.  Nose: Nose normal.  Mouth/Throat: Oropharynx is clear and moist. No oropharyngeal exudate.  Eyes: Conjunctivae and EOM are normal. Pupils are equal, round, and reactive to light.  Neck: No thyromegaly present.  Cardiovascular: Normal rate and regular rhythm.  Exam reveals no gallop and no friction rub.   Pulmonary/Chest: Effort normal. No respiratory distress.  Positive for some  coarse voice sounds not distinct rhonchi or wheezes.  Abdominal: Soft. Bowel sounds are normal. There is no tenderness.  Musculoskeletal: He exhibits edema.  Patient with right sided hemiplegia, and little movement of his lower extremities. Edema of lower extremities is less than 1+.  Lymphadenopathy:    He has no cervical adenopathy.  Skin:  Wound at the sacrum probing to bone. Wound care is done by the wound care nurse.  Psychiatric: He has a normal mood and affect.     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 At past December, 2013, patient  did not want to die. But discussed with Mrs. Portocarrero that his situation has changed considerably that he is just going to decline over time and that nothing can be done about it. Discussed that he is situation is only progressive. 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  Have discussed with Mrs. Gasaway, if her husband is made a DO NOT RESUSCITATE, he will still receive treatment for example signs of symptoms of a urinary tract infection we'll still receive urine culture and treatment. If signs and symptoms of pneumonia, would still receive chest x-ray and treatment if indicated. Discussed that diagnostic testing labs would be continued until she said not to continue.  Discussed that at the very best his sacral pressure ulcer will not heal but hopefully not get worse. He does have a long-term chronic infection, discussed if ongoing intravenous therapy is really in his best interest.  Mrs. Mcauliffe stated she needed to talk to her family, my impression, is that she does not really understand that treatment and nutrition would continue for her husband. Further discussion included health frail her husband's physiological status is, and is very susceptible to change without warning.  We'll continue current  wound care, with the use of Santyl.  GERD we'll continue Pepcid 20 mg twice a day which will hopefully help decrease risk of aspiration.  Neurontin 300 mg at bedtime we'll continue for comfort.  For excess secretions will continue Robinul 1 mg 3 times a day  we'll also continue the scopolamine patch 1.5 mg, apply to the skin, changed every 3 days  We'll also continue the trazodone 25 mg at bedtime  For diabetes, we'll certainly continue Lantus, 100 units per mL, 18 units at bedtime and Humalog as indicated.  Supplementation we'll certainly continue to vitamin C 500 mg twice a day to help facilitate wound management.  We'll also maintain potassium, at 20 mEq per day. We'll also continue multivitamin with minerals each day.  I believe it would be very helpful to have a meeting with the director of nursing, social work, and other involved areas with the patient's wife to facilitate understanding, and hopefully consider changing the patient's status to DO NOT RESUSCITATE.                    This encounter was created in error - please disregard. This encounter was created in error - please disregard.

## 2013-04-29 NOTE — Progress Notes (Signed)
Patient ID: [name], male   DOB: Nov 30, 1935, 78 y.o.   MRN: [id number] Date of service 01/05/2013 Routine note Author:  Deeann Cree. Odem   [name] DOB:  Aug 26, 1935 Medical record number: [id number]  Facility:  [facility], room number 1103B CODE STATUS:  Full code Allergies:  No known diagnosed allergies  Chief complaint:  Management of chronic issues  HPI  HPI:  He is being assessed for his chronic medical issues. Patient reports that he has no pain. He is able to verbalize yes and no.  Concerned verbalized by palliative care nurse regarding the appropriateness of continued IV vancomycin and other IV therapy. Concern related to patient sometimes refusing to have labs drawn to assess peak and trough. Patient is s/p CVA, R hemiplegia, and with progressive dementia.  Chief Complaint  Review of Systems  Constitutional: Positive for weight loss.  HENT: Negative for ear discharge and ear pain.   Eyes: Negative for discharge.  Skin:       Sacral pressure ulcer which probes to bone  Neurological: Positive for weakness. Negative for seizures and loss of consciousness.       Right hemiparalysis, very great difficulty swallowing, requires PEG tube for most of his knees Trish and and medications.  Psychiatric/Behavioral: Positive for depression.   Review of Systems  Constitutional: Positive for weight loss.  HENT: Negative for ear discharge and ear pain.   Eyes: Negative for discharge.  Skin:       Sacral pressure ulcer which probes to bone  Neurological: Positive for weakness. Negative for seizures and loss of consciousness.       Right hemiparalysis, very great difficulty swallowing, requires PEG tube for most of his knees Trish and and medications.  Psychiatric/Behavioral: Positive for depression.    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 difficile Pressure ulcer, sacrum, stage IV Diabetes type 2  Surgical history: Colostomy PEG placement  Current Outpatient Prescriptions on File Prior to Visit  Medication Sig Dispense Refill  . acetaminophen (TYLENOL) 160 MG/5ML liquid Give 640 mg by tube every 4 (four) hours as needed for fever.       Marland Kitchen antiseptic oral rinse (BIOTENE) LIQD 15 mLs by Mouth Rinse route 2 times daily at 12 noon and 4 pm.  237 mL    . atropine 1 % ophthalmic solution Place 3 drops under the tongue 4 (four) times daily.  2 mL  12  . citalopram (CELEXA) 10 MG tablet 10 mg by PEG Tube route every morning.       . collagenase (SANTYL) ointment Apply 1 application topically daily.      . famotidine (PEPCID) 20 MG tablet 20 mg by Gastric Tube route 2 (two) times daily.      . feeding supplement (PRO-STAT SUGAR FREE 64) LIQD Place 30 mLs into feeding tube 3 (three) times daily with meals.      . ferrous sulfate 300 (60 FE) MG/5ML syrup Give 300 mg by tube 2 (two) times daily.        Marland Kitchen gabapentin (NEURONTIN) 300 MG capsule Give 300 mg by tube at bedtime.       Marland Kitchen glycopyrrolate (ROBINUL) 1 MG tablet 1 mg by PEG Tube route 3 (three) times daily.      . insulin glargine (LANTUS) 100 UNIT/ML injection Inject 18 Units into the skin at  bedtime.       . insulin lispro (HUMALOG) 100 UNIT/ML injection Inject 5 Units into the skin 3 (three) times daily before meals. 5 units prior to meals for cbg >= 150      . levothyroxine (SYNTHROID, LEVOTHROID) 125 MCG tablet Give 125 mcg by tube every morning.       Marland Kitchen. liothyronine (CYTOMEL) 25 MCG tablet 25 mcg by PEG Tube route every morning.       . metoprolol tartrate (LOPRESSOR) 25 mg/10 mL SUSP Place 10 mLs (25 mg total) into feeding tube 2 (two) times daily.      . Multiple Vitamins-Minerals (MULTIVITAMINS THER. W/MINERALS) TABS 1 tablet by PEG Tube route every morning.       . potassium chloride 20 MEQ/15ML (10%)  solution Give 20 mEq by tube every morning.       Marland Kitchen. scopolamine (TRANSDERM-SCOP) 1.5 MG Place 1 patch onto the skin every 3 (three) days.      . Thiamine HCl (VITAMIN B-1) 100 MG tablet 100 mg by PEG Tube route every morning.       . traMADol (ULTRAM) 50 MG tablet 1 by mouth every 6 hours as needed for pain  30 tablet  5  . traZODone (DESYREL) 50 MG tablet Give 25 mg by tube at bedtime. For insomnia      . vitamin C (ASCORBIC ACID) 500 MG tablet Give 500 mg by tube 2 (two) times daily.        No current facility-administered medications on file prior to visit.    Diagnostic exams: Noteworthy that the patient intermittently will decline labs. 12/24/2012 WBC 7.2 RBC 3.88 Hemoglobin 10.6 Hematocrit 31.7 MCV 82 MCH 27.3 MCHC 33.4 RDW 18 Platelet 279 Differential all within normal limits.  Glucose 129 BUN 37 Creatinine 1.35 Sodium 140 Potassium 3.9 Chloride 109 CO2 24  Calcium 9 Osmolality 290  12/28/2012 Vancomycin trough 38 BUN 26 Creatinine 1.19  12/30/2012 Hemoglobin A1c at 6.0, finding in June of this year 6.3. 01/03/2013 Vancomycin trough 22 Glucose 86 BUN 28 Creatinine 1.08 Sodium 141 Potassium 4.1 Chloride 110 CO2 25 Calcium 9 Osmolality 286  Vital Signs:  BP 102/64, Pulse 69, RR 18, Pulse Ox 95%  Objective:   Physical Exam  Constitutional:  Patient lying in bed, status post CVA with right hemiplegia. Makes good eye contact, voice is very weak and difficult to understand.  HENT:  Head: Normocephalic and atraumatic.  Nose: Nose normal.  Mouth/Throat: Oropharynx is clear and moist. No oropharyngeal exudate.  Eyes: Conjunctivae and EOM are normal. Pupils are equal, round, and reactive to light.  Neck: No thyromegaly present.  Cardiovascular: Normal rate and regular rhythm.  Exam reveals no gallop and no friction rub.   Pulmonary/Chest: Effort normal. No respiratory distress.  Positive for some coarse voice sounds not distinct rhonchi or wheezes.   Abdominal: Soft. Bowel sounds are normal. There is no tenderness.  Musculoskeletal: He exhibits edema.  Patient with right sided hemiplegia, and little movement of his lower extremities. Edema of lower extremities is less than 1+.  Lymphadenopathy:    He has no cervical adenopathy.  Skin:  Wound at the sacrum probing to bone. Wound care is done by the wound care nurse.  Psychiatric: He has a normal mood and affect.     Assessment/plan  Has spoken with the patient's wife for at least 30 minutes, regarding ongoing care for [name]. At past December, 2013, patient did not want to die. But discussed with Mrs.  [name] that his situation has changed considerably that he is just going to decline over time and that nothing can be done about it. Discussed that he is situation is only progressive. 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  Have discussed with [name], if her husband is made a DO NOT RESUSCITATE, he will still receive treatment for example signs of symptoms of a urinary tract infection we'll still receive urine culture and treatment. If signs and symptoms of pneumonia, would still receive chest x-ray and treatment if indicated. Discussed that diagnostic testing labs would be continued until she said not to continue.  Discussed that at the very best his sacral pressure ulcer will not heal but hopefully not get worse. He does have a long-term chronic infection, discussed if ongoing intravenous therapy is really in his best interest.  [name] stated she needed to talk to her family, my impression, is that she does not really understand that treatment and nutrition would continue for her husband. Further discussion included health frail her husband's physiological status is, and is very susceptible to change without warning.  We'll continue current wound care, with the use of Santyl.  GERD we'll  continue Pepcid 20 mg twice a day which will hopefully help decrease risk of aspiration.  Neurontin 300 mg at bedtime we'll continue for comfort.  For excess secretions will continue Robinul 1 mg 3 times a day  we'll also continue the scopolamine patch 1.5 mg, apply to the skin, changed every 3 days  We'll also continue the trazodone 25 mg at bedtime  For diabetes, we'll certainly continue Lantus, 100 units per mL, 18 units at bedtime and Humalog as indicated.  Supplementation we'll certainly continue to vitamin C 500 mg twice a day to help facilitate wound management.  We'll also maintain potassium, at 20 mEq per day. We'll also continue multivitamin with minerals each day.  I believe it would be very helpful to have a meeting with the director of nursing, social work, and other involved areas with the patient's wife to facilitate understanding, and hopefully consider changing the patient's status to DO NOT RESUSCITATE.

## 2013-04-29 NOTE — Progress Notes (Signed)
This encounter was created in error - please disregard.

## 2013-04-29 NOTE — Addendum Note (Signed)
Addended by: Zachery DauerDEM, Anissia Wessells S on: 04/29/2013 01:34 PM   Modules accepted: Level of Service, SmartSet

## 2013-04-29 NOTE — Progress Notes (Signed)
graphic    Patient ID: [name], male   DOB: 09-14-1935, 78 y.o.   MRN: [id number] Date of service 01/05/2013 Routine note Author:  Deeann CreeDonna S. Tyrica Afzal   Patient:  Dr. Carney CornersNorman W. [name] DOB:  006-04-1935 Medical record number: [id number]  [facility], room number 1103B CODE STATUS:  Full code Allergies:  No known diagnosed allergies  Chief complaint:  Management of chronic issues  HPI  HPI:  He is being assessed for his chronic medical issues. Patient reports that he has no pain. He is able to verbalize yes and no.  Concerned verbalized by palliative care nurse regarding the appropriateness of continued IV vancomycin and other IV therapy. Concern related to patient sometimes refusing to have labs drawn to assess peak and trough. Patient is s/p CVA, R hemiplegia, and with progressive dementia.  Chief Complaint  Review of Systems  Constitutional: Positive for weight loss.  HENT: Negative for ear discharge and ear pain.   Eyes: Negative for discharge.  Skin:        Sacral pressure ulcer which probes to bone  Neurological: Positive for weakness. Negative for seizures and loss of consciousness.       Right hemiparalysis, very great difficulty swallowing, requires PEG tube for most of his knees Trish and and medications.  Psychiatric/Behavioral: Positive for depression.   Review of Systems  Constitutional: Positive for weight loss.  HENT: Negative for ear discharge and ear pain.   Eyes: Negative for discharge.  Skin:        Sacral pressure ulcer which probes to bone  Neurological: Positive for weakness. Negative for seizures and loss of consciousness.       Right hemiparalysis, very great difficulty swallowing, requires PEG tube for most of his knees Trish and and medications.  Psychiatric/Behavioral: Positive for depression.    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 difficile Pressure ulcer, sacrum, stage IV Diabetes type 2  Surgical history: Colostomy PEG placement    Current Outpatient Prescriptions on File Prior to Visit   Medication  Sig  Dispense  Refill   .  acetaminophen (TYLENOL) 160 MG/5ML liquid  Give 640 mg by tube every 4 (four) hours as needed for fever.          Marland Kitchen.  antiseptic oral rinse (BIOTENE) LIQD  15 mLs by Mouth Rinse route 2 times daily at 12 noon and 4 pm.   237 mL      .  atropine 1 % ophthalmic solution  Place 3 drops under the tongue 4 (four) times daily.   2 mL   12   .  citalopram (CELEXA) 10 MG tablet  10 mg by PEG Tube route every morning.          .  collagenase (SANTYL) ointment  Apply 1 application topically daily.         .  famotidine (PEPCID) 20 MG tablet  20 mg by Gastric Tube route 2 (two) times daily.         .  feeding supplement (PRO-STAT SUGAR FREE 64) LIQD  Place 30 mLs into feeding tube 3 (three) times daily with meals.         .  ferrous sulfate 300 (60 FE) MG/5ML syrup  Give 300 mg by tube 2 (two) times daily.           .Marland Kitchen  gabapentin (NEURONTIN) 300 MG capsule  Give 300 mg by tube at bedtime.          Marland Kitchen  glycopyrrolate (ROBINUL) 1 MG tablet  1 mg by PEG Tube route 3 (three) times daily.         .  insulin glargine (LANTUS) 100 UNIT/ML injection  Inject 18 Units into the skin at bedtime.          .  insulin lispro (HUMALOG) 100 UNIT/ML injection  Inject 5 Units into the skin 3 (three) times daily before meals. 5 units prior to meals for cbg >= 150         .  levothyroxine (SYNTHROID, LEVOTHROID) 125 MCG tablet  Give 125 mcg by tube every morning.          Marland Kitchen  liothyronine (CYTOMEL) 25 MCG tablet  25 mcg by PEG Tube route every morning.          .  metoprolol tartrate (LOPRESSOR) 25 mg/10 mL SUSP  Place 10 mLs (25 mg total) into feeding tube 2 (two) times daily.         .  Multiple Vitamins-Minerals (MULTIVITAMINS THER. W/MINERALS) TABS  1  tablet by PEG Tube route every morning.          .  potassium chloride 20 MEQ/15ML (10%) solution  Give 20 mEq by tube every morning.          Marland Kitchen  scopolamine (TRANSDERM-SCOP) 1.5 MG  Place 1 patch onto the skin every 3 (three) days.         .  Thiamine HCl (VITAMIN B-1) 100 MG tablet  100 mg by PEG Tube route every morning.          .  traMADol (ULTRAM) 50 MG tablet  1 by mouth every 6 hours as needed for pain   30 tablet   5   .  traZODone (DESYREL) 50 MG tablet  Give 25 mg by tube at bedtime. For insomnia         .  vitamin C (ASCORBIC ACID) 500 MG tablet  Give 500 mg by tube 2 (two) times daily.             No current facility-administered medications on file prior to visit.     Diagnostic exams: Noteworthy that the patient intermittently will decline labs. 12/24/2012 WBC 7.2 RBC 3.88 Hemoglobin 10.6 Hematocrit 31.7 MCV 82 MCH 27.3 MCHC 33.4 RDW 18 Platelet 279 Differential all within normal limits.  Glucose 129 BUN 37 Creatinine 1.35 Sodium 140 Potassium 3.9 Chloride 109 CO2 24  Calcium 9 Osmolality 290  12/28/2012 Vancomycin trough 38 BUN 26 Creatinine 1.19  12/30/2012 Hemoglobin A1c at 6.0, finding in June of this year 6.3. 01/03/2013 Vancomycin trough 22 Glucose 86 BUN 28 Creatinine 1.08 Sodium 141 Potassium 4.1 Chloride 110 CO2 25 Calcium 9 Osmolality 286  Vital Signs:  BP 102/64, Pulse 69, RR 18, Pulse Ox 95%    Objective:    Physical Exam  Constitutional:  Patient lying in bed, status post CVA with right hemiplegia. Makes good eye contact, voice is very weak and difficult to understand.  HENT:   Head: Normocephalic and atraumatic.   Nose: Nose normal.   Mouth/Throat: Oropharynx is clear and moist. No oropharyngeal exudate.  Eyes: Conjunctivae and EOM are normal. Pupils are equal, round, and reactive to light.  Neck: No thyromegaly present.  Cardiovascular: Normal rate and regular rhythm.  Exam reveals no gallop and no friction rub.  Pulmonary/Chest: Effort normal. No respiratory distress.  Positive for some coarse voice sounds not distinct rhonchi or wheezes.  Abdominal: Soft. Bowel sounds are normal. There is no tenderness.  Musculoskeletal: He exhibits edema.  Patient with right sided hemiplegia, and little movement of his lower extremities. Edema of lower extremities is less than 1+.  Lymphadenopathy:    He has no cervical adenopathy.  Skin:  Wound at the sacrum probing to bone. Wound care is done by the wound care nurse.  Psychiatric: He has a normal mood and affect.     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 At past December, 2013, patient did not want to die. But discussed with Mrs. Meiklejohn that his situation has changed considerably that he is just going to decline over time and that nothing can be done about it. Discussed that he is situation is only progressive. 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  Have discussed with Mrs. Nastasi, if her husband is made a DO NOT RESUSCITATE, he will still receive treatment for example signs of symptoms of a urinary tract infection we'll still receive urine culture and treatment. If signs and symptoms of pneumonia, would still receive chest x-ray and treatment if indicated. Discussed that diagnostic testing labs would be continued until she said not to continue.  Discussed that at the very best his sacral pressure ulcer will not heal but hopefully not get worse. He does have a long-term chronic infection, discussed if ongoing intravenous therapy is really in his best interest.  Mrs. Mabe stated she needed to talk to her family, my impression, is that she does not really understand that treatment and nutrition would continue for her husband. Further discussion included health frail her husband's physiological status  is, and is very susceptible to change without warning.  We'll continue current wound care, with the use of Santyl.  GERD we'll continue Pepcid 20 mg twice a day which will hopefully help decrease risk of aspiration.  Neurontin 300 mg at bedtime we'll continue for comfort.  For excess secretions will continue Robinul 1 mg 3 times a day  we'll also continue the scopolamine patch 1.5 mg, apply to the skin, changed every 3 days  We'll also continue the trazodone 25 mg at bedtime  For diabetes, we'll certainly continue Lantus, 100 units per mL, 18 units at bedtime and Humalog as indicated.  Supplementation we'll certainly continue to vitamin C 500 mg twice a day to help facilitate wound management.  We'll also maintain potassium, at 20 mEq per day. We'll also continue multivitamin with minerals each day.  I believe it would be very helpful to have a meeting with the director of nursing, social work, and other involved areas with the patient's wife to facilitate understanding, and hopefully consider changing the patient's status to DO NOT RESUSCITATE.

## 2013-04-29 NOTE — Addendum Note (Signed)
Addended by: Zachery DauerDEM, DONNA S on: 04/29/2013 01:13 PM   Modules accepted: Level of Service, SmartSet

## 2013-04-29 NOTE — Progress Notes (Addendum)
Patient ID: [name], male   DOB: 1935-09-10, 78 y.o.   MRN: [id number] Date of service 01/05/2013 Routine note Author:  Deeann CreeDonna S. Giovannie Scerbo   [name] DOB:  [id number] Medical record number: [id number]  [facility], room number 1103B CODE STATUS:  Full code Allergies:  No known diagnosed allergies  Chief complaint:  Management of chronic issues  HPI  HPI:  He is being assessed for his chronic medical issues. Patient reports that he has no pain. He is able to verbalize yes and no.  Concerned verbalized by palliative care nurse regarding the appropriateness of continued IV vancomycin and other IV therapy. Concern related to patient sometimes refusing to have labs drawn to assess peak and trough. Patient is s/p CVA, R hemiplegia, and with progressive dementia.  Chief Complaint  Review of Systems  Constitutional: Positive for weight loss.  HENT: Negative for ear discharge and ear pain.   Eyes: Negative for discharge.  Skin:       Sacral pressure ulcer which probes to bone  Neurological: Positive for weakness. Negative for seizures and loss of consciousness.       Right hemiparalysis, very great difficulty swallowing, requires PEG tube for most of his knees Trish and and medications.  Psychiatric/Behavioral: Positive for depression.   Review of Systems  Constitutional: Positive for weight loss.  HENT: Negative for ear discharge and ear pain.   Eyes: Negative for discharge.  Skin:       Sacral pressure ulcer which probes to bone  Neurological: Positive for weakness. Negative for seizures and loss of consciousness.       Right hemiparalysis, very great difficulty swallowing, requires PEG tube for most of his knees Trish and and medications.  Psychiatric/Behavioral: Positive for depression.    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 difficile Pressure ulcer, sacrum, stage IV Diabetes type 2  Surgical history: Colostomy PEG placement  Current Outpatient Prescriptions on File Prior to Visit  Medication Sig Dispense Refill  . acetaminophen (TYLENOL) 160 MG/5ML liquid Give 640 mg by tube every 4 (four) hours as needed for fever.       Marland Kitchen. antiseptic oral rinse (BIOTENE) LIQD 15 mLs by Mouth Rinse route 2 times daily at 12 noon and 4 pm.  237 mL    . atropine 1 % ophthalmic solution Place 3 drops under the tongue 4 (four) times daily.  2 mL  12  . citalopram (CELEXA) 10 MG tablet 10 mg by PEG Tube route every morning.       . collagenase (SANTYL) ointment Apply 1 application topically daily.      . famotidine (PEPCID) 20 MG tablet 20 mg by Gastric Tube route 2 (two) times daily.      . feeding supplement (PRO-STAT SUGAR FREE 64) LIQD Place 30 mLs into feeding tube 3 (three) times daily with meals.      . ferrous sulfate 300 (60 FE) MG/5ML syrup Give 300 mg by tube 2 (two) times daily.        Marland Kitchen. gabapentin (NEURONTIN) 300 MG capsule Give 300 mg by tube at bedtime.       Marland Kitchen. glycopyrrolate (ROBINUL) 1 MG tablet 1 mg by PEG Tube route 3 (three) times daily.      . insulin glargine (LANTUS) 100 UNIT/ML injection Inject 18 Units into the skin at bedtime.       .Marland Kitchen  insulin lispro (HUMALOG) 100 UNIT/ML injection Inject 5 Units into the skin 3 (three) times daily before meals. 5 units prior to meals for cbg >= 150      . levothyroxine (SYNTHROID, LEVOTHROID) 125 MCG tablet Give 125 mcg by tube every morning.       Marland Kitchen liothyronine (CYTOMEL) 25 MCG tablet 25 mcg by PEG Tube route every morning.       . metoprolol tartrate (LOPRESSOR) 25 mg/10 mL SUSP Place 10 mLs (25 mg total) into feeding tube 2 (two) times daily.      . Multiple Vitamins-Minerals (MULTIVITAMINS THER. W/MINERALS) TABS 1 tablet by PEG Tube route every morning.       . potassium chloride 20 MEQ/15ML (10%)  solution Give 20 mEq by tube every morning.       Marland Kitchen scopolamine (TRANSDERM-SCOP) 1.5 MG Place 1 patch onto the skin every 3 (three) days.      . Thiamine HCl (VITAMIN B-1) 100 MG tablet 100 mg by PEG Tube route every morning.       . traMADol (ULTRAM) 50 MG tablet 1 by mouth every 6 hours as needed for pain  30 tablet  5  . traZODone (DESYREL) 50 MG tablet Give 25 mg by tube at bedtime. For insomnia      . vitamin C (ASCORBIC ACID) 500 MG tablet Give 500 mg by tube 2 (two) times daily.        No current facility-administered medications on file prior to visit.    Diagnostic exams: Noteworthy that the patient intermittently will decline labs. 12/24/2012 WBC 7.2 RBC 3.88 Hemoglobin 10.6 Hematocrit 31.7 MCV 82 MCH 27.3 MCHC 33.4 RDW 18 Platelet 279 Differential all within normal limits.  Glucose 129 BUN 37 Creatinine 1.35 Sodium 140 Potassium 3.9 Chloride 109 CO2 24  Calcium 9 Osmolality 290  12/28/2012 Vancomycin trough 38 BUN 26 Creatinine 1.19  12/30/2012 Hemoglobin A1c at 6.0, finding in June of this year 6.3. 01/03/2013 Vancomycin trough 22 Glucose 86 BUN 28 Creatinine 1.08 Sodium 141 Potassium 4.1 Chloride 110 CO2 25 Calcium 9 Osmolality 286  Vital Signs:  BP 102/64, Pulse 69, RR 18, Pulse Ox 95%  Objective:   Physical Exam  Constitutional:  Patient lying in bed, status post CVA with right hemiplegia. Makes good eye contact, voice is very weak and difficult to understand.  HENT:  Head: Normocephalic and atraumatic.  Nose: Nose normal.  Mouth/Throat: Oropharynx is clear and moist. No oropharyngeal exudate.  Eyes: Conjunctivae and EOM are normal. Pupils are equal, round, and reactive to light.  Neck: No thyromegaly present.  Cardiovascular: Normal rate and regular rhythm.  Exam reveals no gallop and no friction rub.   Pulmonary/Chest: Effort normal. No respiratory distress.  Positive for some coarse voice sounds not distinct rhonchi or wheezes.   Abdominal: Soft. Bowel sounds are normal. There is no tenderness.  Musculoskeletal: He exhibits edema.  Patient with right sided hemiplegia, and little movement of his lower extremities. Edema of lower extremities is less than 1+.  Lymphadenopathy:    He has no cervical adenopathy.  Skin:  Wound at the sacrum probing to bone. Wound care is done by the wound care nurse.  Psychiatric: He has a normal mood and affect.     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 At past December, 2013, patient did not want to die. But discussed with Mrs. Jowett that his situation has changed considerably that  he is just going to decline over time and that nothing can be done about it. Discussed that he is situation is only progressive. 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  Have discussed with Mrs. Sacra, if her husband is made a DO NOT RESUSCITATE, he will still receive treatment for example signs of symptoms of a urinary tract infection we'll still receive urine culture and treatment. If signs and symptoms of pneumonia, would still receive chest x-ray and treatment if indicated. Discussed that diagnostic testing labs would be continued until she said not to continue.  Discussed that at the very best his sacral pressure ulcer will not heal but hopefully not get worse. He does have a long-term chronic infection, discussed if ongoing intravenous therapy is really in his best interest.  Mrs. Pemble stated she needed to talk to her family, my impression, is that she does not really understand that treatment and nutrition would continue for her husband. Further discussion included health frail her husband's physiological status is, and is very susceptible to change without warning.  We'll continue current wound care, with the use of Santyl.  GERD we'll  continue Pepcid 20 mg twice a day which will hopefully help decrease risk of aspiration.  Neurontin 300 mg at bedtime we'll continue for comfort.  For excess secretions will continue Robinul 1 mg 3 times a day  we'll also continue the scopolamine patch 1.5 mg, apply to the skin, changed every 3 days  We'll also continue the trazodone 25 mg at bedtime  For diabetes, we'll certainly continue Lantus, 100 units per mL, 18 units at bedtime and Humalog as indicated.  Supplementation we'll certainly continue to vitamin C 500 mg twice a day to help facilitate wound management.  We'll also maintain potassium, at 20 mEq per day. We'll also continue multivitamin with minerals each day.  I believe it would be very helpful to have a meeting with the director of nursing, social work, and other involved areas with the patient's wife to facilitate understanding, and hopefully consider changing the patient's status to DO NOT RESUSCITATE.   This note is the proper and correct note and was not created in error.    Note spelling correction

## 2013-04-30 NOTE — Progress Notes (Signed)
Date of Visit 01/05/2013  Rm # 1103 B Full Code  Dr. Merlene Laughter  Patient ID: [name], male   DOB: Oct 21, 1935, 78 y.o.   MRN: [id number]  No Known Allergies  Chief Complaint  Patient presents with  . Medical Managment of Chronic Issues    History of present Illness: Patient is an unfortunate 78 year old African American male, status post CVA, right hemiplegia, and progressive dementia. His ability to verbalize is very limited, predominantly saying yes or no.  He has a sacral pressure ulcer which probes to bone, and is positive for osteomyelitis.  It is likely that the sacral pressure ulcer it will not heal, hopefully the infection the control, and would not worsen. The patient is on appropriate air mattress.  The patient's ability to swallow is very limited and he receives most of his hydration, nutrition, and medications through the PEG tube.  I have been asked by the palliative care nurse to assess the patient and speak with his wife regarding the appropriateness of continuing aggressive IV therapy including vancomycin.  He is known, that the patient intermittently refuses his labs.  Review of Systems  Constitutional: Positive for malaise/fatigue and diaphoresis.  HENT: Negative for ear discharge and nosebleeds.   Eyes: Negative for discharge and redness.  Respiratory: Negative for hemoptysis.   Cardiovascular: Negative for PND.  Gastrointestinal: Negative for blood in stool.  Genitourinary: Negative for hematuria and flank pain.  Skin:       Sacral pressure ulcer which probes to bone  Neurological: Positive for focal weakness. Negative for seizures and loss of consciousness.       Right hemiplegia Patient has only very limited ability to speak, his voice is weak.  Psychiatric/Behavioral: Positive for depression and memory loss. Negative for hallucinations.       Status post CVA with progressive dementia Insomnia   Past Medical History:   Status post CVA with  progressive dementia and right hemiplegia Hypertension Endocarditis, mitral bowel, syphilitic Hypothyroidism Anorexia Dysphasia Reflux Bacteremia C. difficile Pressure ulcer sacrum, stage IV Type 2 diabetes  Surgical history: Colostomy PEG tube placement  Current medications Acetaminophen 160 mg per 5 mL, 640 mg every 4 hours as needed for fever Biotene oral rinse 15 ml twice a day Atropine 1% ophthalmic solution, 3 drops under the tongue 4 times a day to control oral secretions Celexa, 10 mg, 10 mg per PEG tube each morning Wound care per the wound care nurse, Santyl ointment to the wound Pepcid 20 mg per PEG tube twice a day Pro-stat sugarfree supplement, 30 mL's into the feeding tube 3 times a day with meals Ferrous sulfate 300 mg per 5 mL, 300 mg twice a day via PEG tube Neurontin 300 mg via PEG tube each evening Robinul 1 mg per PEG tube 3 times a day Lantus, 100 units per mL, 18 units subcutaneously at bedtime Synthroid, 125 mcg via PEG tube every morning Cytomel 25 mcg every morning Metoprolol  25 mg per 10 mL, 25 mg to be PEG tube twice a day Multivitamin with minerals, one tablet via PEG tube every morning Potassium chloride, 20 mEq per 15 mL, 10% solution, 20 mEq via PEG tube every morning Transderm scopolamine, 1.5 mg, 1 patch to skin every 3 days after removing the old patch. Time in vitamin B1, 100 mg via PEG tube every morning Tramadol 50 mg one tablet every 6 hours as needed for pain Trazodone 50 mg, one half tablet by PEG  tube at bedtime Vitamin C, 500 mg, 1 via PEG tube twice a day  Recent labs, from 12/24/2012 WBC 7.2 RBC 3.88 Hemoglobin 10.6 Hematocrit 31.7 MCV 92 MCH 27.3 MCHC 33.2 RDW 18 Platelets 279 Differential within normal limits  Glucose 129 BUN 37 Creatinine 1.35 Sodium 140 Potassium 3.9 Chloride 109 CO2 24 Calcium 9 Osmolality 290  12/28/2012 Vancomycin trough 38 BUN 26 Creatinine 1.19   12/30/2012 Hemoglobin A1c at 6,  in June was 6.3  01/03/2013 Vancomycin trough 22 Leukos 86 BUN 28 Creatinine 1.08 Sodium 141 Potassium 4.1 Chloride 110 CO2 25 Calcium 9 Osmolality 286  Vital signs, but pressure 102/64 Pulse 69 Respiratory rate 18 Pulse oximetry 95%  Physical examination Patient is in bed, status post CVA with right hemiplegia. Makes very good eye contact, voices week and difficult to understand. His affect appears appropriate. His normocephalic and atraumatic External ears unremarkable Oral pharynx is clear and moist, no oral pharyngeal exudate no gross oral lesions. Conjunctiva is pale pink, extraocular movements are intact, and pulse are equally round and reactive to light Neck, no palpable adenopathy or thyromegaly Apical pulse is with a regular rate and rhythm Respiratory, no respiratory distress. Positive for some coarse breath sounds, but no distinct rales or rhonchi or wheezes. Lung bases are clear. Abdomen, soft bowel sounds are present, there is no abdominal tenderness Musculoskeletal, positive for trace to 1+ edema, does not extend up posterior thighs Patient with right-sided hemiplegia and very little movement of his lower extremities Sacral wound, with wound care done by the wound care nurse.  Assessment/plan:  I have spoken with the patient's wife for at least 30 minutes. Backspace, regarding ongoing care for Dr. Carola Frost. December 2013, patient became very ill verbalized that he did not want to die. However, have discussed with Mrs. Sandy that her husband's condition has deteriorated significantly, including his dementia. Discussed that there is nothing that can be done about the progressive decline. Discussed with Mrs. Pulver that the patient was not likely to heal, with the goal being to keep it as clean as possible and not worsened.  Discussed that the intravenous antibiotics keep the wound in a holding pattern, but ultimately osteomyelitis involves a surgical procedure to remove the  diseased bone, and Dr. Carola Frost is not a good surgical candidate.  Also stated, at this time, Dr. Magdalene Patricia condition had deteriorated so much, he just is not competent to make a decision regarding DO NOT RESUSCITATE. Discussed with Mrs. Banta that any resuscitation, would be very traumatic, possibly break ribs, and Dr. Carola Frost could potentially end up on a ventilator.  Discussed with Mrs. Barco that if her husband is made a DO NOT RESUSCITATE, he would still receive treatment for his medical conditions. Example given of signs symptoms of a urinary tract infection, culture would still be done and antibiotics used indicated. Also, if the patient exhibited signs and symptoms of pneumonia, he was still receive a chest x-ray and again antibiotics is appropriate. Discussed with Mrs. Baeten that we would respect her decisions, and do as much or as little as she wanted [place] to do. Discussed so receive his other medicines. Also discussed that diagnostic labs certainly be done if indicated. If her husband about one to be done.  Mrs. Colborn stated she needed to talk to her family, it is my impression that she does not really understand that treatment and nutrition continue for her husband.  I believe a meeting with administration, social work, and Catering manager of nursing, is  indicated to address this issue and facilitate Mrs. Tugwell's understanding. I have tried to impress that his condition could deteriorate very suddenly without warning.  We will certainly continue his current wound care  GERD, we will continue his Pepcid 20 mg twice a day, which will hopefully help decrease the risk of aspiration.  Neurontin 300 mg at bedtime continue for as a comfort measure  For excess secretions, the Robinul, 1 mg 3 times a day. We'll also continue the scopolamine patch, 1.5 mg to be changed every 3 days  Insomnia, will continue the trazodone 25 mg at bedtime  Diabetes, will continue his Lantus 18 units at bedtime and Humalog as  indicated. Said her decreasing his insulin as his diabetes is under such good control. Discussed if there was something particularly that appeal to the patient I would not object even if it was high carbohydrate.  Supplementation, we will continue the vitamin C 500 mg twice a day, potassium 20 mEq per day to her event hypo-kalemia. We will continue his post supplementation.  Again the most critical issue is a meeting with appropriate staff to address and discuss with Mrs. Nappier decisions that need to be made. The patient's wife verbalizes understanding and meeting will be scheduled.                  Humalog, 100 units per mL, 5 units subcutaneously 3 times a day before meals. 5 units prior to meals if capillary blood glucoses care unit and 150

## 2013-09-17 IMAGING — CT CT ABD-PELV W/ CM
2 of 6 series · 17 of 46 positions shown, 19 images · IV contrast (water/omni  & 100ml omni 300)
Comparison: CT of the abdomen and pelvis 04/13/2012.

CLINICAL DATA: Sepsis.  Abdominal pain and nausea.  Vomiting.
Fever.

CT ABDOMEN AND PELVIS WITH CONTRAST
TECHNIQUE: Multidetector CT imaging of the abdomen and pelvis was
performed following the standard protocol during bolus
administration of intravenous contrast.
Contrast: 100mL OMNIPAQUE IOHEXOL 300 MG/ML  SOLN

[Series 2: routine abdomen · axial · 0.98mm/px · z∈[-476,-16]mm · 14 of 118 slices shown, 16 images]
[im 8/118  soft-tissue]
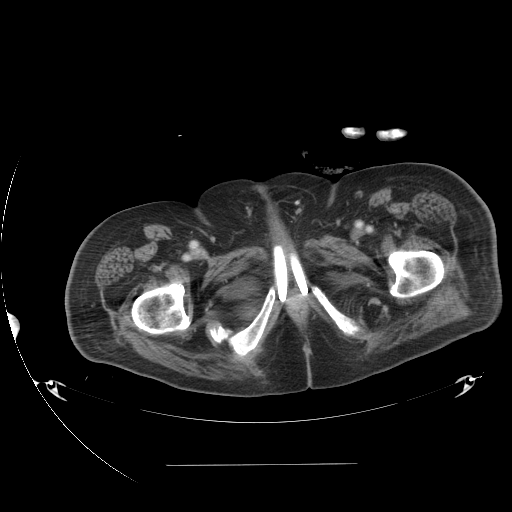
[im 8/118  bone]
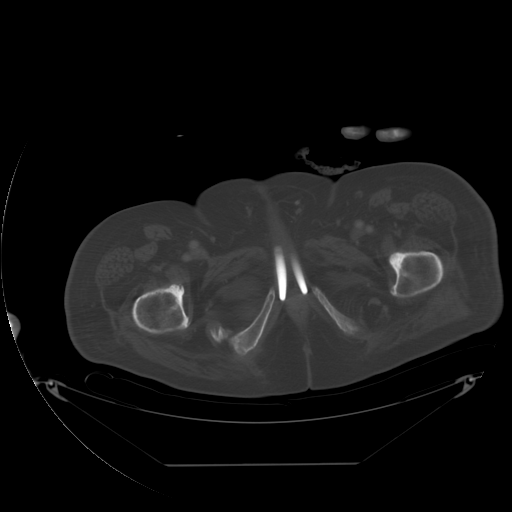
[im 15/118  soft-tissue]
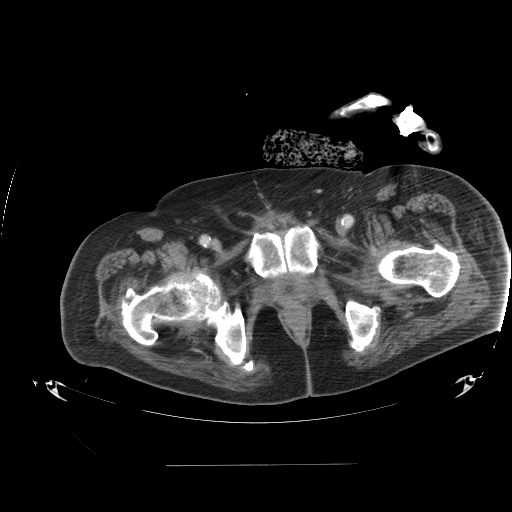
[im 22/118  soft-tissue]
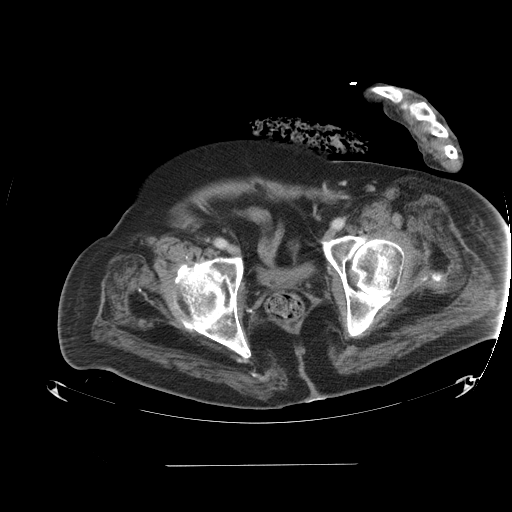
[im 30/118  soft-tissue]
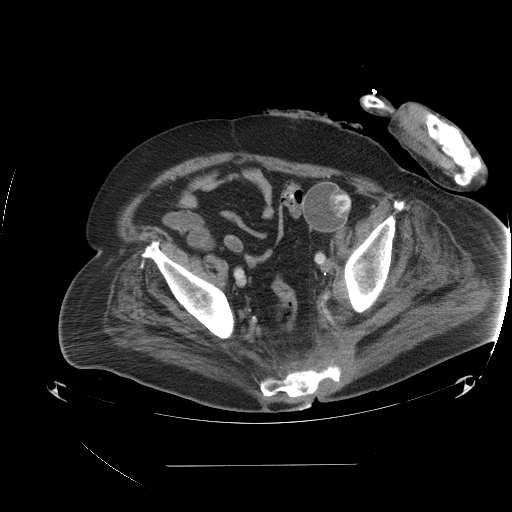
[im 37/118  soft-tissue]
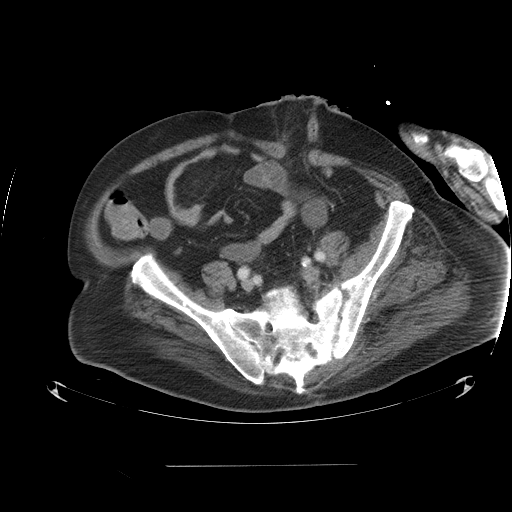
[im 44/118  soft-tissue]
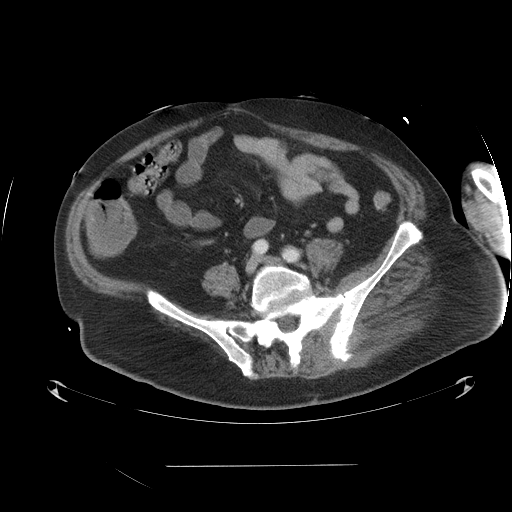
[im 52/118  soft-tissue]
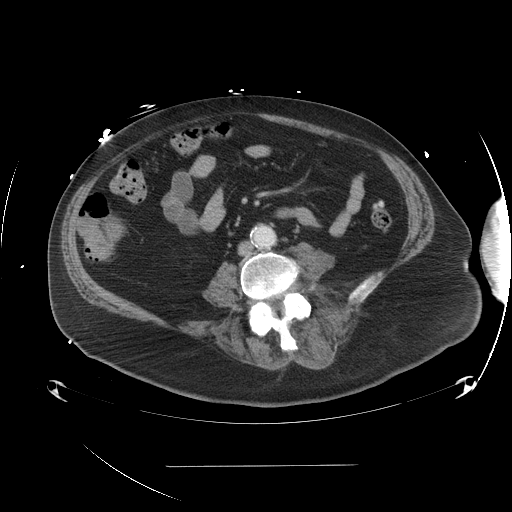
[im 66/118  soft-tissue]
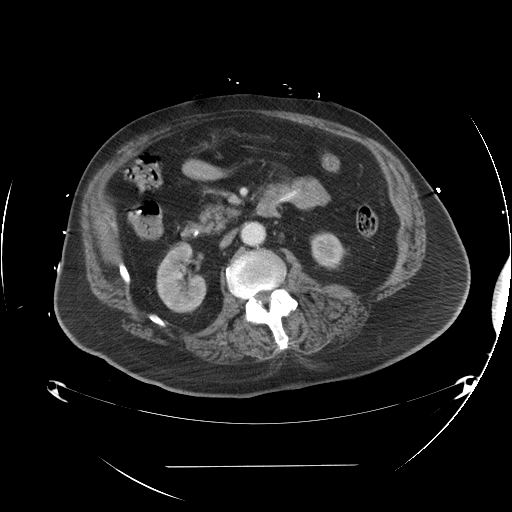
[im 74/118  soft-tissue]
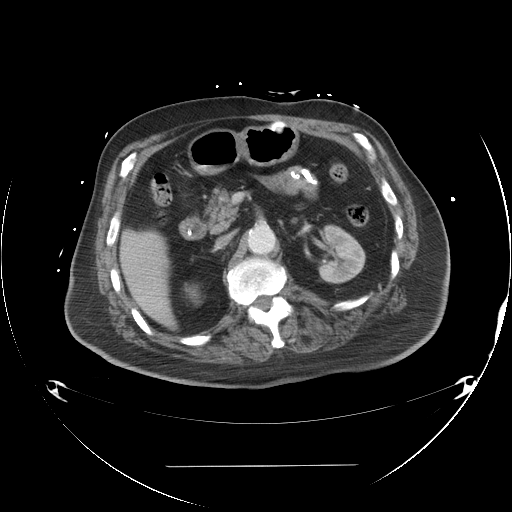
[im 74/118  bone]
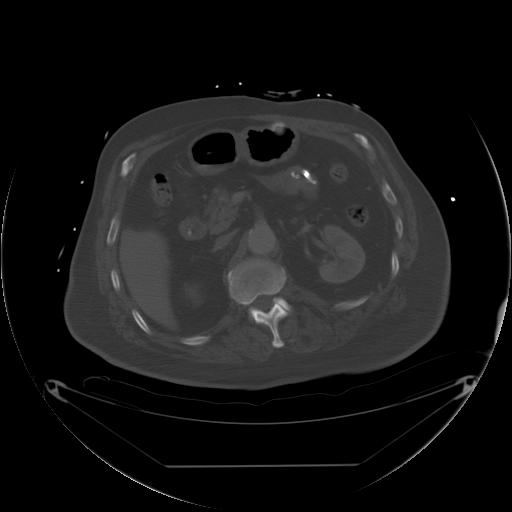
[im 81/118  soft-tissue]
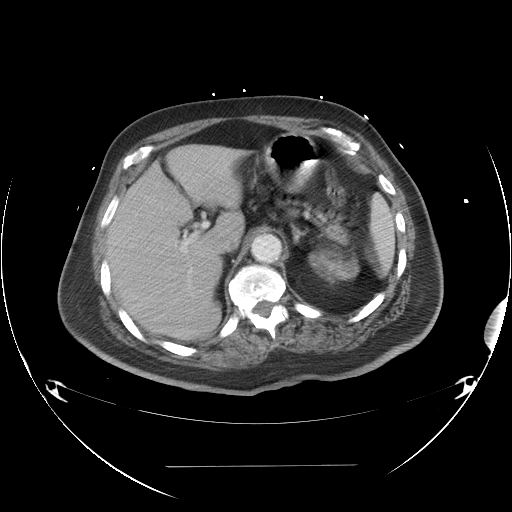
[im 88/118  soft-tissue]
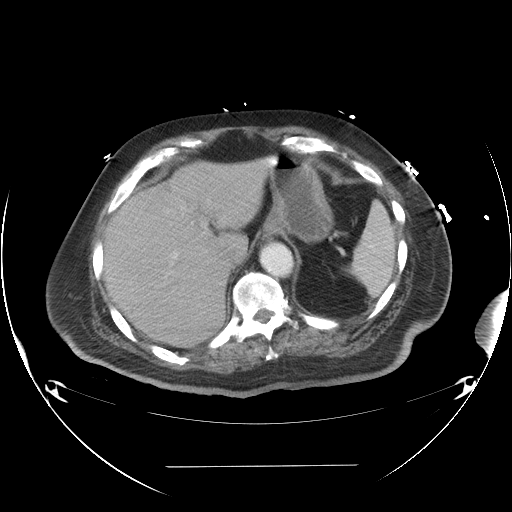
[im 96/118  soft-tissue]
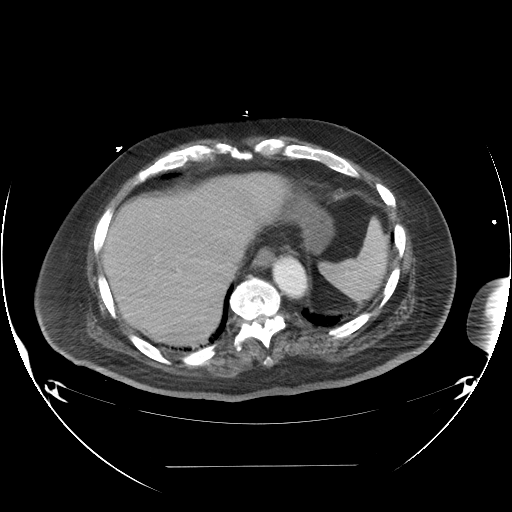
[im 103/118  soft-tissue]
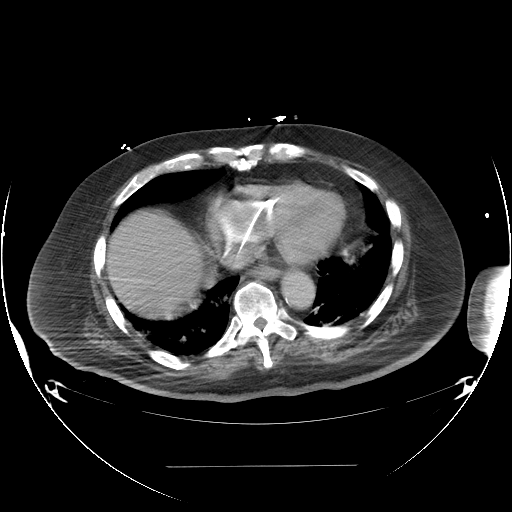
[im 110/118  soft-tissue]
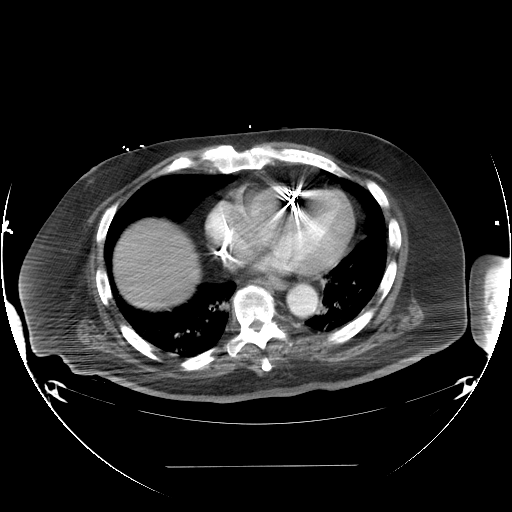

[Series 401: coronal · coronal · 1.02mm/px · 3 of 114 slices shown]
[im 38/114  soft-tissue]
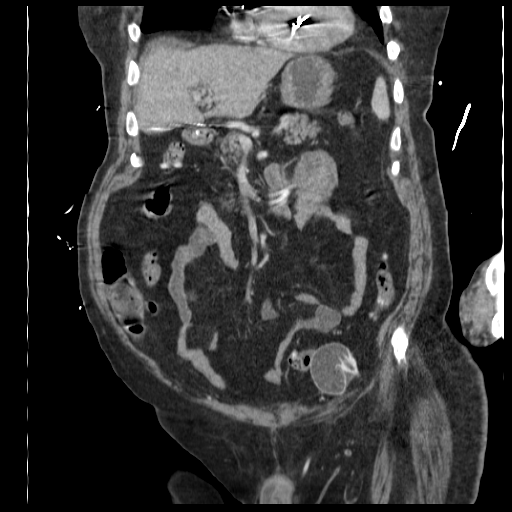
[im 51/114  soft-tissue]
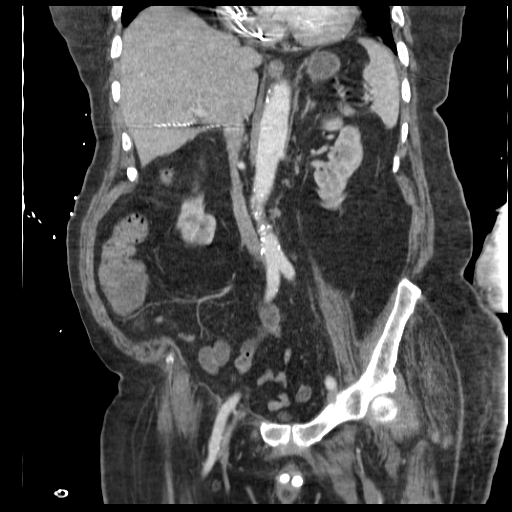
[im 63/114  soft-tissue]
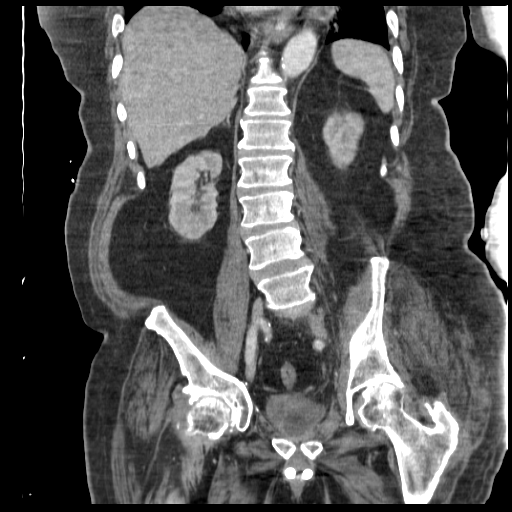

[17 of 46 positions shown; findings below may reference images not displayed]

FINDINGS: Bilateral lower lobe airspace disease progressed,
worrisome for pneumonia.  The heart size is normal.  Pacing wires
are in place.  No significant pleural or pericardial effusion is
present.

The liver and spleen are within normal limits.  The
gastrojejunostomy tube is in place.  The tip is at the ligament of
Treitz.  The pancreas is mildly atrophic but within normal limits.
The common bile duct is within normal limits following
cholecystectomy.  Adrenal glands are normal bilaterally.  A 6 mm
posterior nonobstructing stone is present in the left kidney.
There are several sub-centimeter hypodense lesions bilaterally,
likely representing simple cysts.

Slight ectasia of the abdominal aorta is noted.  There is no
aneurysm.

A Foley catheter is present within the collapsed urinary bladder.
Penile prosthesis is in place.  A colostomy and mucous fistula is
present in the left lower quadrant.  The bowel is otherwise
unremarkable.  No significant adenopathy or free fluid is present.

A lytic lesion in the left iliac bone, adjacent to the SI joint is
slightly more prominent than on the prior exam.
IMPRESSION: 1.  No acute or focal abnormality to explain the patient's
symptoms.
2.  Nonobstructing left renal stone is stable.
3.  Status post cholecystectomy.
4.  Satisfactory positioning of the gastrojejunostomy tube.
5.  Left lower quadrant colectomy without evidence for
complication.
6.  Atherosclerosis.
7.  New onset of bilateral lower lobe airspace disease is
concerning for pneumonia.

## 2014-01-19 IMAGING — XA IR REPLACE G-TUBE/COLONIC TUBE
1 series · 9 of 9 positions shown · non-contrast
Comparison: 02/05/2012

CLINICAL DATA: Old in the dual lumen gastrojejunostomy catheter

GASTROSTOMY CATHETER REPLACEMENT

[Series 1: run · 9 of 9 slices shown]
[im 1/9]
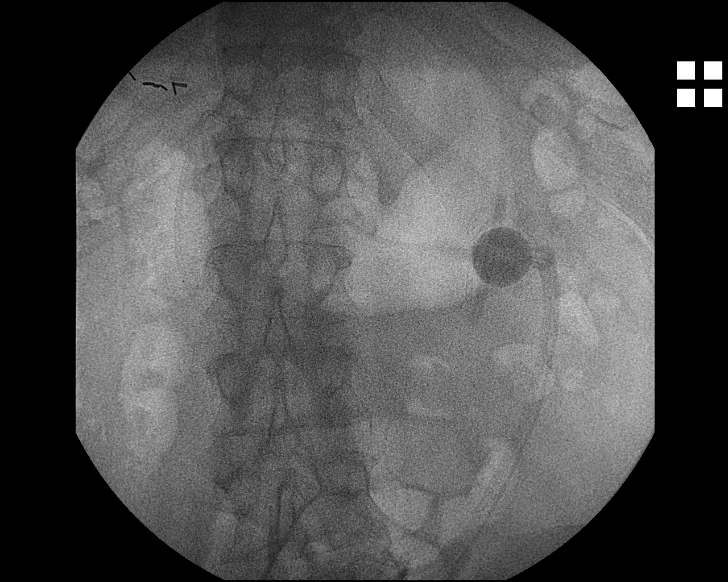
[im 2/9]
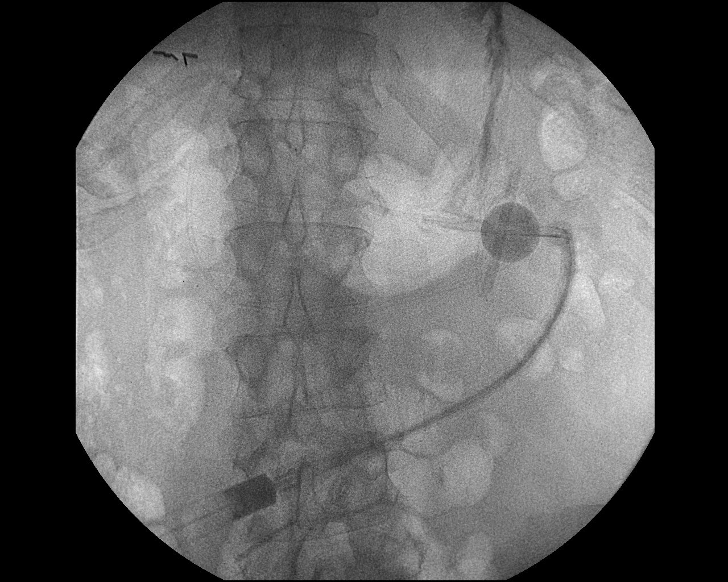
[im 3/9]
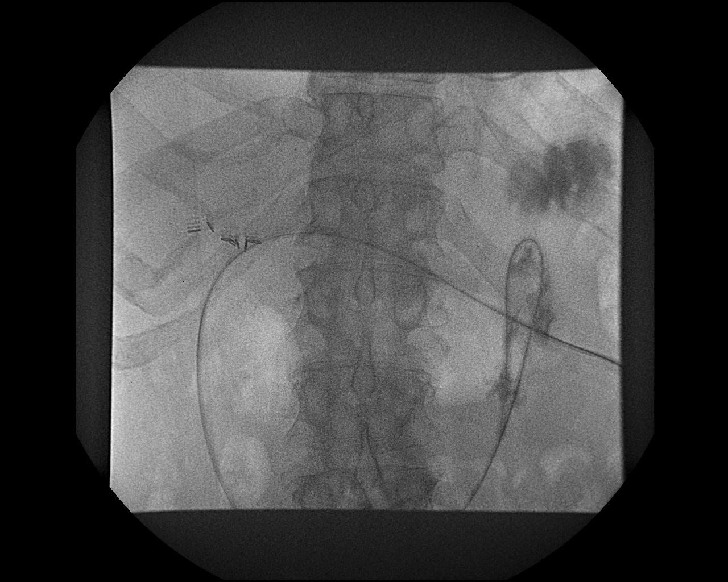
[im 4/9]
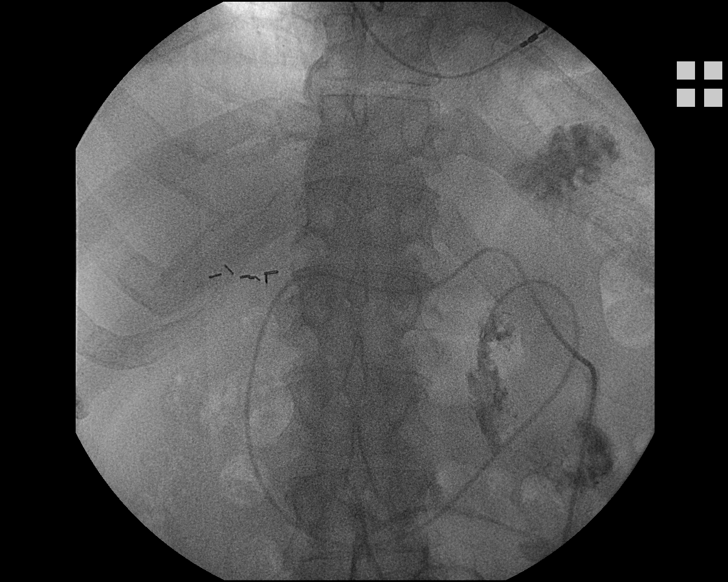
[im 5/9]
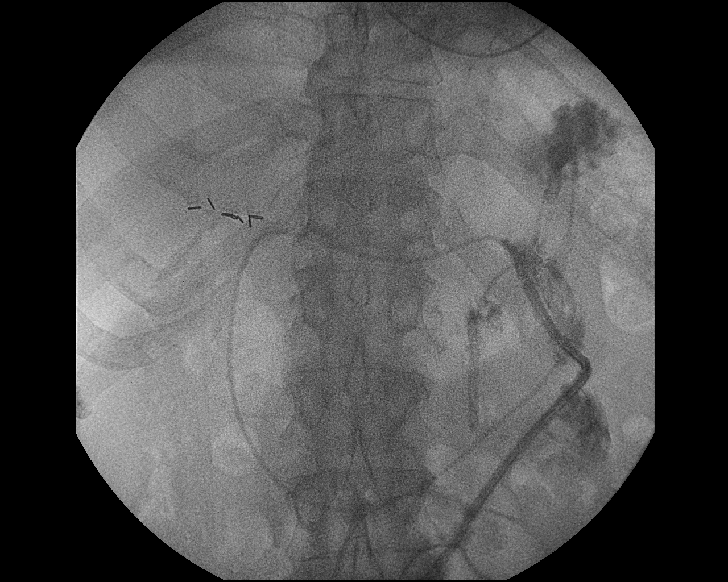
[im 6/9]
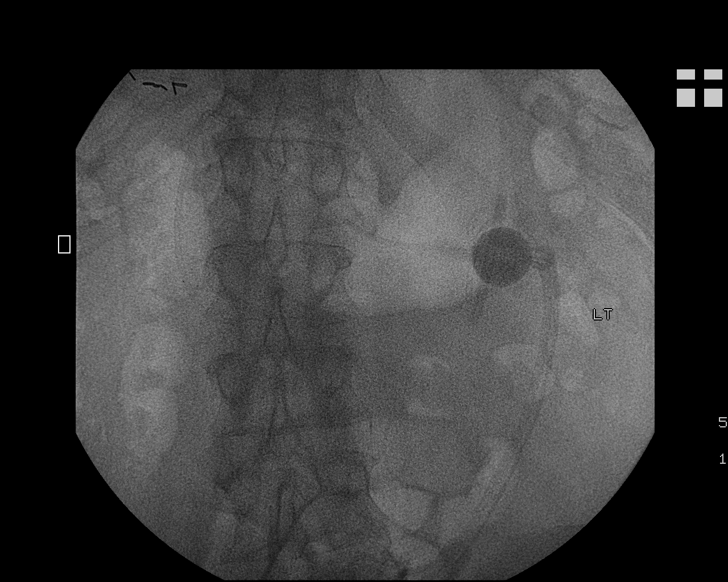
[im 7/9]
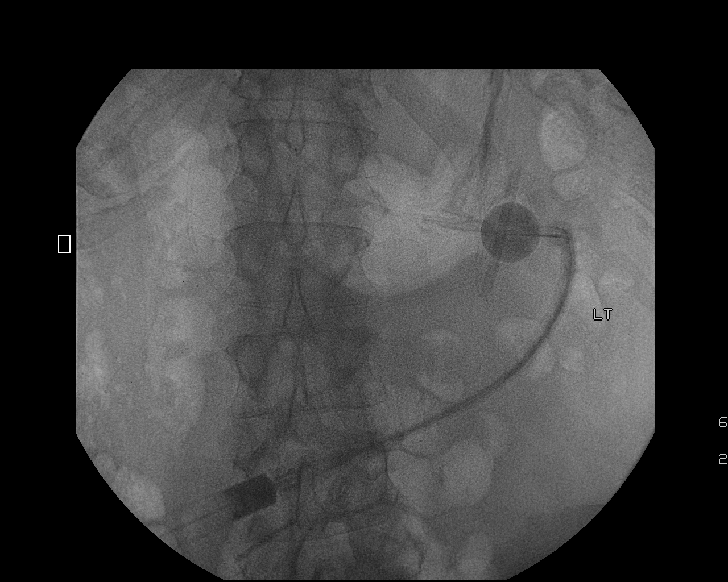
[im 8/9]
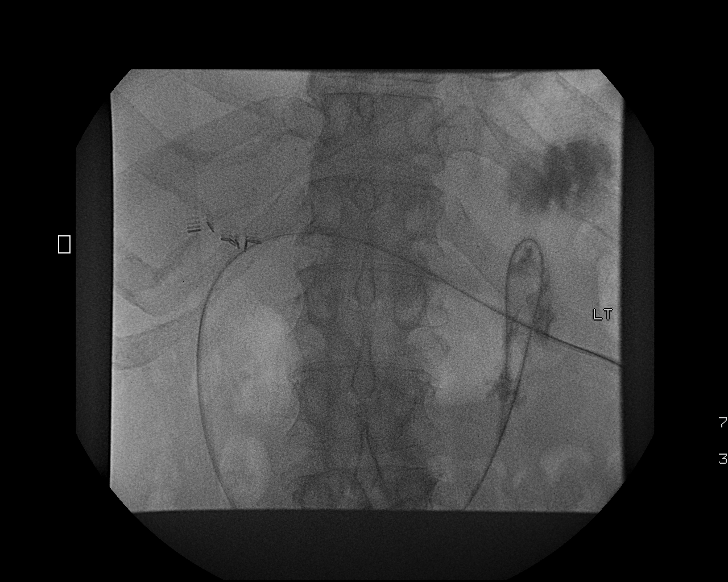
[im 9/9]
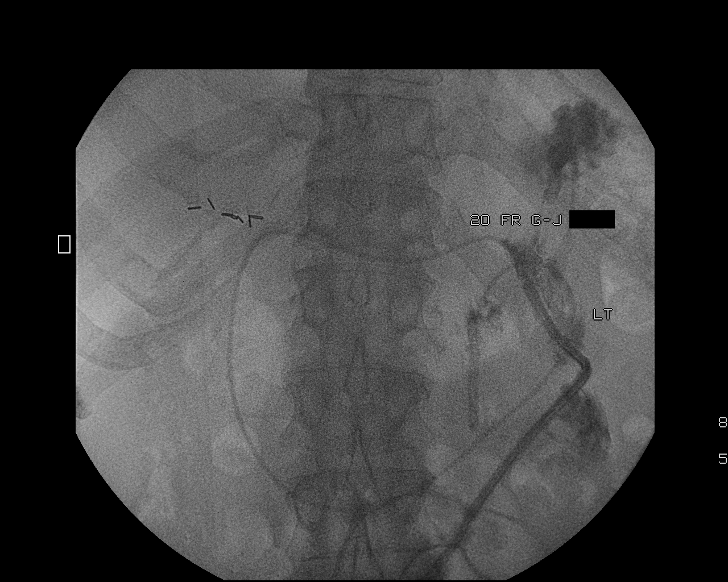

[9 of 9 positions shown; findings below may reference images not displayed]

FINDINGS: The previously placed gastrojejunostomy catheter and
surrounding skin were prepped and draped in usual sterile fashion.
Contrast could not be injected through the jejunal lumen.
Fluoroscopic inspection demonstrated the tip of the catheter in the
region of the ligament of Treitz.  A glide wire would not pass
beyond the distal aspect of the catheter, which appeared to be in a
true knot. For this reason, the gastrojejunostomy   catheter was
cut and partially withdrawn into a 12-French peel-away sheath,
which was then advanced over the catheter into the proximal
duodenum.  A parallel glide wire was advanced.

The peel-away sheath and old gastrojejunostomy catheter were then
removed, confirming the true knot in the distal aspect of the
jejunal limb.  A new 20-French dual lumen gastrojejunostomy
catheter was then advanced, positioned with the distal end of the
jejunal limb in the proximal jejunum, and  the retention balloon
was inflated in the gastric lumen with 10 ml sterile saline.
Injection of both the gastric and jejunal limb with contrast under
fluoroscopy demonstrated appropriate positioning and patency. The
patient tolerated the procedure well.  No immediate complication.

Fluoroscopy time:  4 minutes 24 seconds
IMPRESSION: Technically successful exchange of 20-French dual lumen
gastrojejunostomy catheter.

## 2014-02-20 IMAGING — XA IR FLUORO RM 0-60 MIN
1 series · 2 of 2 positions shown · non-contrast
Comparison: Imaging during midline catheter placement on
09/22/2012.

CLINICAL DATA: Status post placement of left upper extremity
midline catheter to the level of the axillary vein for IV
vancomycin administration.  The catheter could not be advanced
centrally due to underlying venous stenosis.  There is inability to
aspirate blood from the catheter currently.

IR FLOURO RM 0-60 MIN
Fluoro time:  3 seconds.

[Series 1: run · 2 of 2 slices shown]
[im 1/2]
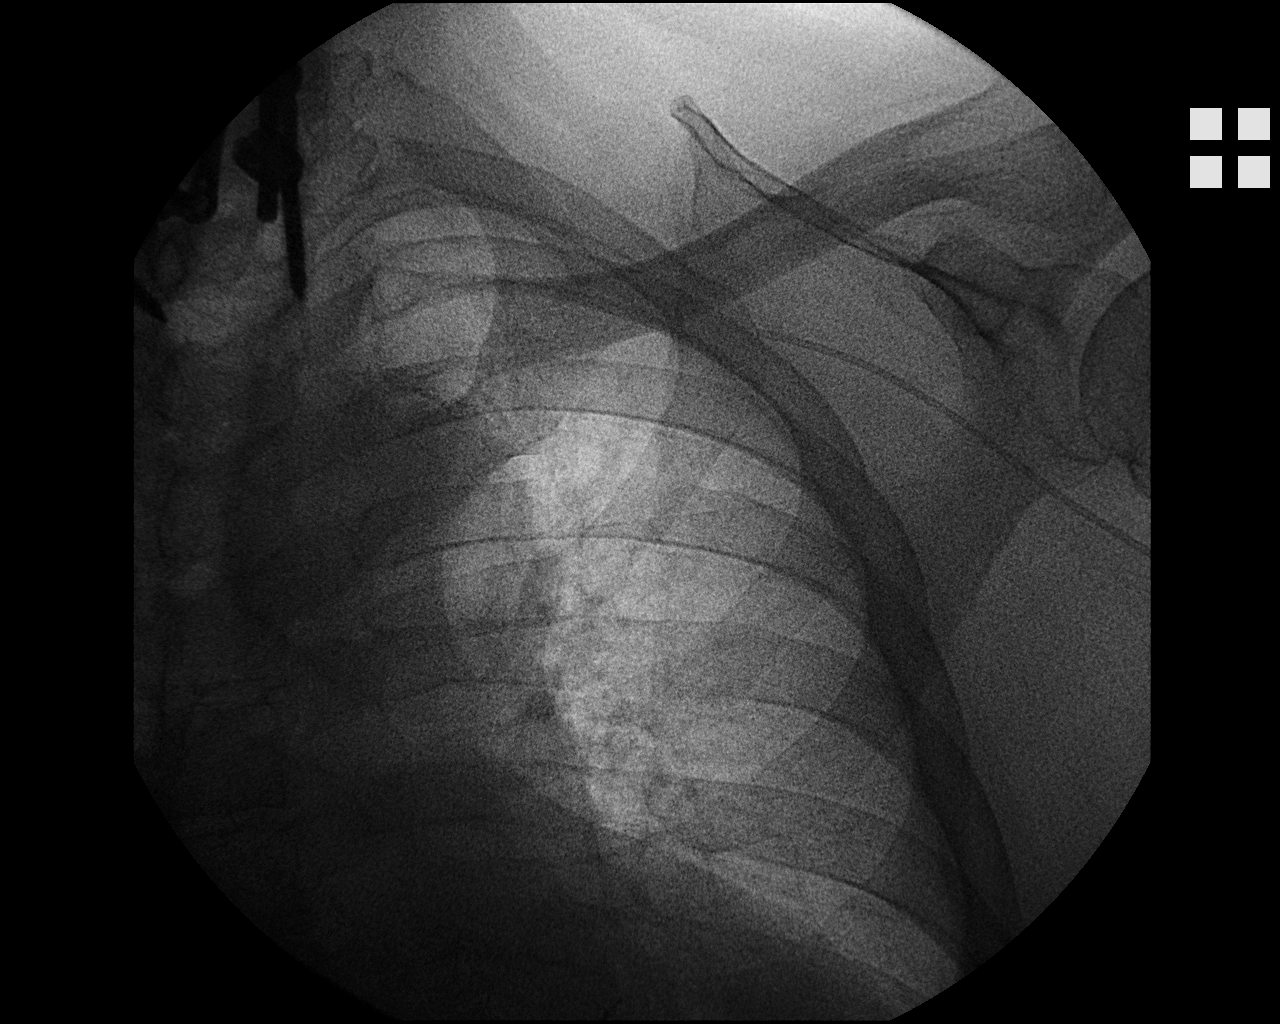
[im 2/2]
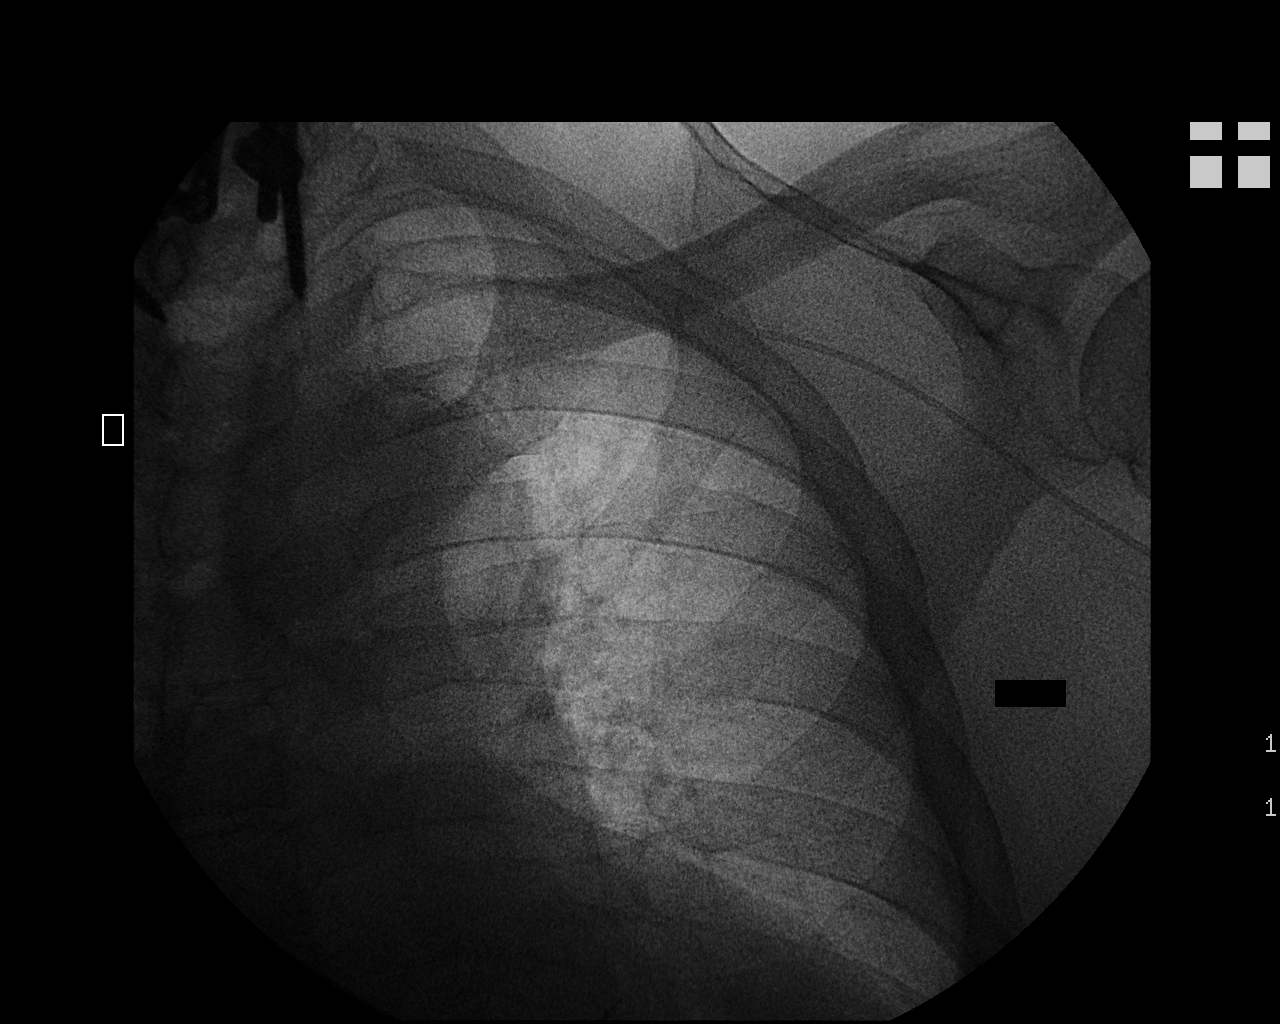

[2 of 2 positions shown; findings below may reference images not displayed]

FINDINGS: Fluoroscopy was performed showing stable positioning of
the midline catheter at the level of the rib margin on the left
within the axillary vein.  Aspiration did not yield blood.  The
catheter flushed easily.  The catheter was retracted by a few
centimeters and additional manipulation did not yield blood with
aspiration.  During manipulation, the patient experience
significant nausea and vomiting and had to be suctioned.  He could
not tolerate any further manipulation or replacement of the
catheter.  This catheter is clearly intravascular and can be used
for vancomycin administration.
IMPRESSION: Left arm midline catheter tip is within the axillary vein.  This
catheter flushed easily.  With retraction, there was persistent
inability to aspirate blood, likely reflecting fibrin sheath at the
tip of the catheter.  Manipulation of the catheter had to be
abruptly discontinued due to development of nausea with active
vomiting requiring suctioning of the oropharynx.  The catheter can
continue to be used for IV administration currently.
